# Patient Record
Sex: Male | Born: 1945 | Hispanic: No | Marital: Married | Smoking: Former smoker
Health system: Southern US, Community
[De-identification: ages and names within clinical notes are randomized; demographics above are authoritative.]

## PROBLEM LIST (undated history)

## (undated) DIAGNOSIS — R0789 Other chest pain: Secondary | ICD-10-CM

## (undated) DIAGNOSIS — K219 Gastro-esophageal reflux disease without esophagitis: Secondary | ICD-10-CM

## (undated) DIAGNOSIS — I10 Essential (primary) hypertension: Secondary | ICD-10-CM

## (undated) DIAGNOSIS — R296 Repeated falls: Secondary | ICD-10-CM

## (undated) DIAGNOSIS — Z972 Presence of dental prosthetic device (complete) (partial): Secondary | ICD-10-CM

## (undated) DIAGNOSIS — W19XXXA Unspecified fall, initial encounter: Secondary | ICD-10-CM

## (undated) DIAGNOSIS — E785 Hyperlipidemia, unspecified: Secondary | ICD-10-CM

## (undated) DIAGNOSIS — IMO0002 Reserved for concepts with insufficient information to code with codable children: Secondary | ICD-10-CM

## (undated) DIAGNOSIS — N400 Enlarged prostate without lower urinary tract symptoms: Secondary | ICD-10-CM

## (undated) DIAGNOSIS — M1990 Unspecified osteoarthritis, unspecified site: Secondary | ICD-10-CM

## (undated) DIAGNOSIS — J45909 Unspecified asthma, uncomplicated: Secondary | ICD-10-CM

## (undated) DIAGNOSIS — R195 Other fecal abnormalities: Secondary | ICD-10-CM

## (undated) DIAGNOSIS — G4733 Obstructive sleep apnea (adult) (pediatric): Secondary | ICD-10-CM

## (undated) DIAGNOSIS — K439 Ventral hernia without obstruction or gangrene: Secondary | ICD-10-CM

## (undated) DIAGNOSIS — G5600 Carpal tunnel syndrome, unspecified upper limb: Secondary | ICD-10-CM

## (undated) DIAGNOSIS — F419 Anxiety disorder, unspecified: Secondary | ICD-10-CM

## (undated) DIAGNOSIS — E119 Type 2 diabetes mellitus without complications: Secondary | ICD-10-CM

## (undated) DIAGNOSIS — G5601 Carpal tunnel syndrome, right upper limb: Secondary | ICD-10-CM

## (undated) DIAGNOSIS — R079 Chest pain, unspecified: Secondary | ICD-10-CM

## (undated) DIAGNOSIS — M549 Dorsalgia, unspecified: Secondary | ICD-10-CM

## (undated) DIAGNOSIS — Z9889 Other specified postprocedural states: Secondary | ICD-10-CM

## (undated) HISTORY — PX: SHOULDER SURGERY: SHX246

## (undated) HISTORY — DX: Unspecified asthma, uncomplicated: J45.909

## (undated) HISTORY — DX: Other fecal abnormalities: R19.5

## (undated) HISTORY — DX: Essential (primary) hypertension: I10

## (undated) HISTORY — DX: Reserved for concepts with insufficient information to code with codable children: IMO0002

## (undated) HISTORY — DX: Unspecified fall, initial encounter: W19.XXXA

## (undated) HISTORY — DX: Anxiety disorder, unspecified: F41.9

## (undated) HISTORY — DX: Carpal tunnel syndrome, right upper limb: G56.01

## (undated) HISTORY — DX: Carpal tunnel syndrome, unspecified upper limb: G56.00

## (undated) HISTORY — PX: CARPAL TUNNEL RELEASE: SHX101

## (undated) HISTORY — PX: NECK SURGERY: SHX720

## (undated) HISTORY — DX: Ventral hernia without obstruction or gangrene: K43.9

## (undated) HISTORY — DX: Benign prostatic hyperplasia without lower urinary tract symptoms: N40.0

## (undated) HISTORY — DX: Other specified postprocedural states: Z98.890

## (undated) HISTORY — PX: KNEE SURGERY: SHX244

## (undated) HISTORY — DX: Hyperlipidemia, unspecified: E78.5

## (undated) HISTORY — DX: Other chest pain: R07.89

## (undated) HISTORY — DX: Type 2 diabetes mellitus without complications: E11.9

## (undated) HISTORY — DX: Repeated falls: R29.6

## (undated) HISTORY — DX: Morbid (severe) obesity due to excess calories: E66.01

## (undated) HISTORY — DX: Obstructive sleep apnea (adult) (pediatric): G47.33

## (undated) HISTORY — DX: Unspecified osteoarthritis, unspecified site: M19.90

## (undated) HISTORY — DX: Chest pain, unspecified: R07.9

---

## 2002-07-18 ENCOUNTER — Encounter: Admission: RE | Admit: 2002-07-18 | Discharge: 2002-07-18 | Payer: Self-pay | Admitting: Unknown Physician Specialty

## 2002-07-18 ENCOUNTER — Encounter: Payer: Self-pay | Admitting: Unknown Physician Specialty

## 2003-11-12 ENCOUNTER — Other Ambulatory Visit: Payer: Self-pay

## 2005-05-01 ENCOUNTER — Emergency Department: Payer: Self-pay | Admitting: Emergency Medicine

## 2007-08-30 ENCOUNTER — Emergency Department: Payer: Self-pay | Admitting: Unknown Physician Specialty

## 2007-10-07 ENCOUNTER — Encounter: Admission: RE | Admit: 2007-10-07 | Discharge: 2007-10-07 | Payer: Self-pay | Admitting: Unknown Physician Specialty

## 2007-11-14 ENCOUNTER — Encounter: Admission: RE | Admit: 2007-11-14 | Discharge: 2007-11-14 | Payer: Self-pay | Admitting: Orthopedic Surgery

## 2008-03-20 ENCOUNTER — Ambulatory Visit: Payer: Self-pay | Admitting: Family Medicine

## 2008-07-11 ENCOUNTER — Ambulatory Visit: Payer: Self-pay | Admitting: Family Medicine

## 2008-09-09 ENCOUNTER — Ambulatory Visit: Payer: Self-pay | Admitting: Family Medicine

## 2009-02-20 ENCOUNTER — Encounter: Admission: RE | Admit: 2009-02-20 | Discharge: 2009-02-20 | Payer: Self-pay | Admitting: Orthopedic Surgery

## 2009-10-09 ENCOUNTER — Ambulatory Visit: Payer: Self-pay | Admitting: Urology

## 2009-10-22 ENCOUNTER — Ambulatory Visit: Payer: Self-pay | Admitting: Urology

## 2010-02-13 ENCOUNTER — Encounter: Admission: RE | Admit: 2010-02-13 | Discharge: 2010-02-13 | Payer: Self-pay | Admitting: Orthopedic Surgery

## 2010-09-21 ENCOUNTER — Ambulatory Visit: Payer: Self-pay | Admitting: Family Medicine

## 2010-11-03 ENCOUNTER — Emergency Department: Payer: Self-pay | Admitting: Unknown Physician Specialty

## 2013-02-26 DIAGNOSIS — M75100 Unspecified rotator cuff tear or rupture of unspecified shoulder, not specified as traumatic: Secondary | ICD-10-CM | POA: Insufficient documentation

## 2013-03-12 DIAGNOSIS — G8929 Other chronic pain: Secondary | ICD-10-CM | POA: Insufficient documentation

## 2013-07-04 ENCOUNTER — Encounter: Payer: Self-pay | Admitting: *Deleted

## 2013-07-06 ENCOUNTER — Ambulatory Visit: Payer: Self-pay | Admitting: Cardiovascular Disease

## 2013-07-06 ENCOUNTER — Encounter: Payer: Self-pay | Admitting: *Deleted

## 2013-07-17 ENCOUNTER — Encounter: Payer: Self-pay | Admitting: *Deleted

## 2013-07-19 ENCOUNTER — Ambulatory Visit: Payer: Self-pay | Admitting: Cardiovascular Disease

## 2013-07-26 ENCOUNTER — Encounter: Payer: Self-pay | Admitting: Cardiovascular Disease

## 2013-07-26 ENCOUNTER — Ambulatory Visit (INDEPENDENT_AMBULATORY_CARE_PROVIDER_SITE_OTHER): Payer: BC Managed Care – PPO | Admitting: Cardiovascular Disease

## 2013-07-26 VITALS — BP 90/60 | HR 71 | Ht 69.0 in | Wt 304.2 lb

## 2013-07-26 DIAGNOSIS — R0602 Shortness of breath: Secondary | ICD-10-CM

## 2013-07-26 DIAGNOSIS — R079 Chest pain, unspecified: Secondary | ICD-10-CM

## 2013-07-26 DIAGNOSIS — I10 Essential (primary) hypertension: Secondary | ICD-10-CM

## 2013-07-26 DIAGNOSIS — E785 Hyperlipidemia, unspecified: Secondary | ICD-10-CM

## 2013-07-26 HISTORY — DX: Chest pain, unspecified: R07.9

## 2013-07-26 NOTE — Progress Notes (Signed)
Primary care physician: Dr. Maryruth Hancock  HPI  This is a pleasant 67 year old man who was referred by Dr. Allena Katz for evaluation of atypical chest pain. The patient has no previous cardiac history. However, he has multiple chronic medical conditions that include diabetes mellitus, hypertension, hyperlipidemia, sleep apnea and morbid obesity. He suffers from severe arthritis. He had right shoulder surgery done 3 times in the past. He relies heavily on his left arm. Recently, he started having episodes of left-sided sharp chest discomfort worse with certain movements. This is usually brief and does not last for more than a minute or 2. He does have associated exertional dyspnea. He is not aware of any previous cardiac history or cardiac testing. There is no family history of premature coronary artery disease. He is a former smoker. His blood pressure is somewhat low today but he denies any dizziness.  No Known Allergies   Current Outpatient Prescriptions on File Prior to Visit  Medication Sig Dispense Refill  . albuterol (PROAIR HFA) 108 (90 BASE) MCG/ACT inhaler Inhale 2 puffs into the lungs every 6 (six) hours as needed for wheezing.      Marland Kitchen aspirin 81 MG tablet Take 81 mg by mouth daily.      Marland Kitchen atorvastatin (LIPITOR) 80 MG tablet Take 80 mg by mouth daily.      . cetirizine (ZYRTEC) 10 MG tablet Take 10 mg by mouth daily.      . chlorpheniramine (CHLOR-TRIMETON) 4 MG tablet Take 4 mg by mouth every 6 (six) hours as needed for allergies.      . DULoxetine (CYMBALTA) 30 MG capsule Take 30 mg by mouth 2 (two) times daily.      Marland Kitchen gabapentin (NEURONTIN) 600 MG tablet Take 600 mg by mouth 3 (three) times daily.      Marland Kitchen HYDROcodone-acetaminophen (NORCO) 10-325 MG per tablet Take 1 tablet by mouth every 4 (four) hours as needed for pain.      . hydrOXYzine (ATARAX/VISTARIL) 25 MG tablet Take 25 mg by mouth every 6 (six) hours as needed for itching.      . insulin detemir (LEVEMIR) 100 UNIT/ML injection  Inject 70 Units into the skin 2 (two) times daily.      . insulin lispro (HUMALOG) 100 UNIT/ML injection Inject 36 Units into the skin 2 (two) times daily.      Marland Kitchen lidocaine (LIDODERM) 5 % Place 1 patch onto the skin daily. Remove & Discard patch within 12 hours or as directed by MD      . lisinopril-hydrochlorothiazide (PRINZIDE,ZESTORETIC) 20-12.5 MG per tablet Take 1 tablet by mouth 2 (two) times daily.      . metFORMIN (GLUCOPHAGE) 500 MG tablet Take 500 mg by mouth 2 (two) times daily with a meal.      . methocarbamol (ROBAXIN) 500 MG tablet Take 1,000 mg by mouth every 12 (twelve) hours as needed.      . metoprolol (LOPRESSOR) 100 MG tablet Take 100 mg by mouth 2 (two) times daily.      . Multiple Vitamin (MULTIVITAMIN) tablet Take 1 tablet by mouth daily.      . naproxen (NAPROSYN) 500 MG tablet Take 500 mg by mouth 2 (two) times daily with a meal.      . Omega-3 Fatty Acids (FISH OIL) 1000 MG CAPS Take by mouth 2 (two) times daily.      Marland Kitchen omeprazole (PRILOSEC) 20 MG capsule Take 20 mg by mouth daily.      Marland Kitchen testosterone cypionate (  DEPOTESTOTERONE CYPIONATE) 200 MG/ML injection Inject 200 mg into the muscle every 14 (fourteen) days.      . traZODone (DESYREL) 150 MG tablet Take 150 mg by mouth at bedtime.      . triamcinolone cream (KENALOG) 0.5 % Apply topically 2 (two) times daily.      . vitamin C (ASCORBIC ACID) 500 MG tablet Take 500 mg by mouth daily.       No current facility-administered medications on file prior to visit.     Past Medical History  Diagnosis Date  . Morbid obesity   . Diabetes mellitus without complication   . DDD (degenerative disc disease)   . Carpal tunnel syndrome   . OA (osteoarthritis)     knees  . OSA (obstructive sleep apnea)   . Anxiety   . BPH (benign prostatic hypertrophy)   . Ventral hernia   . Heme positive stool   . Asthma   . Hyperlipidemia   . Hypertension      Past Surgical History  Procedure Laterality Date  . Knee surgery  Bilateral   . Neck surgery    . Carpal tunnel release    . Shoulder surgery Right      Family History  Problem Relation Age of Onset  . Heart attack Father      History   Social History  . Marital Status: Married    Spouse Name: N/A    Number of Children: N/A  . Years of Education: N/A   Occupational History  . Not on file.   Social History Main Topics  . Smoking status: Former Smoker -- 0.25 packs/day for 15 years    Types: Cigarettes  . Smokeless tobacco: Not on file  . Alcohol Use: No  . Drug Use: No  . Sexual Activity: Not on file   Other Topics Concern  . Not on file   Social History Narrative  . No narrative on file     ROS A 10 point review of system was performed. It is negative other than that mentioned in the history of present illness.   PHYSICAL EXAM   BP 90/60  Pulse 71  Ht 5\' 9"  (1.753 m)  Wt 304 lb 4 oz (138.007 kg)  BMI 44.91 kg/m2 Constitutional: He is oriented to person, place, and time. He is morbidly obese. No distress.  HENT: No nasal discharge.  Head: Normocephalic and atraumatic.  Eyes: Pupils are equal and round.  No discharge. Neck: Normal range of motion. Neck supple. No JVD present. No thyromegaly present.  Cardiovascular: Normal rate, regular rhythm, normal heart sounds. Exam reveals no gallop and no friction rub. No murmur heard.  Pulmonary/Chest: Effort normal and breath sounds normal. No stridor. No respiratory distress. He has no wheezes. He has no rales. He exhibits no tenderness.  Abdominal: Soft. Bowel sounds are normal. He exhibits no distension. There is no tenderness. There is no rebound and no guarding.  Musculoskeletal: Normal range of motion. He exhibits no edema and no tenderness.  Neurological: He is alert and oriented to person, place, and time. Coordination normal.  Skin: Skin is warm and dry. No rash noted. He is not diaphoretic. No erythema. No pallor.  Psychiatric: He has a normal mood and affect. His  behavior is normal. Judgment and thought content normal.       ION:GEXBM  Rhythm  WITHIN NORMAL LIMITS   ASSESSMENT AND PLAN

## 2013-07-26 NOTE — Assessment & Plan Note (Signed)
He is currently being treated with atorvastatin 80 mg daily.

## 2013-07-26 NOTE — Assessment & Plan Note (Signed)
Blood pressure is low today but he is asymptomatic. Continue to monitor.

## 2013-07-26 NOTE — Patient Instructions (Addendum)
Your physician has requested that you have a lexiscan myoview. For further information please visit [email or web address] Please follow instruction sheet, as given.  Your physician wants you to follow-up in: 12 months.  You will receive a reminder letter in the mail two months in advance. If you don't receive a letter, please call our office to schedule the follow-up appointment.  [facility] MYOVIEW  Your caregiver has ordered a Stress Test with nuclear imaging. The purpose of this test is to evaluate the blood supply to your heart muscle. This procedure is referred to as a "Non-Invasive Stress Test." This is because other than having an IV started in your vein, nothing is inserted or "invades" your body. Cardiac stress tests are done to find areas of poor blood flow to the heart by determining the extent of coronary artery disease (CAD). Some patients exercise on a treadmill, which naturally increases the blood flow to your heart, while others who are  unable to walk on a treadmill due to physical limitations have a pharmacologic/chemical stress agent called Lexiscan . This medicine will mimic walking on a treadmill by temporarily increasing your coronary blood flow.   Please note: these test may take anywhere between 2-4 hours to complete  PLEASE REPORT TO [facility]TR[facility]  THE VOLUNTEERS AT THE FIRST DESK WILL DIRECT YOU WHERE TO GO  Date of Procedure:___11/11/2014__________________________________  Arrival Time for Procedure:_____945 am_________________________  Instructions regarding medication:   ____ : Hold diabetes medication morning of procedure  ____:  Hold betablocker(s) night before procedure and morning of procedure  ____:  Hold other medications as  follows:_________________________________________________________________________________________________________________________________________________________________________________________________________________________________________________________________________________________  PLEASE NOTIFY THE OFFICE AT LEAST 24 HOURS IN ADVANCE IF YOU ARE UNABLE TO KEEP YOUR APPOINTMENT.  [phone] AND  PLEASE NOTIFY NUCLEAR MEDICINE AT [facility] AT LEAST 24 HOURS IN ADVANCE IF YOU ARE UNABLE TO KEEP YOUR APPOINTMENT. [phone]  How to prepare for your Myoview test:  1. Do not eat or drink after midnight 2. No caffeine for 24 hours prior to test 3. No smoking 24 hours prior to test. 4. Your medication may be taken with water.  If your doctor stopped a medication because of this test, do not take that medication. 5. Ladies, please do not wear dresses.  Skirts or pants are appropriate. Please wear a short sleeve shirt. 6. No perfume, cologne or lotion. 7. Wear comfortable walking shoes. No heels!

## 2013-07-26 NOTE — Assessment & Plan Note (Signed)
His chest pain is overall atypical and likely musculoskeletal based on his description. However, he has significant exertional dyspnea with multiple risk factors for coronary artery disease. Due to that, I recommend evaluation with a pharmacologic nuclear stress test. He is not able to exercise on a treadmill due to extensive arthritis. The stress test will be a 2 day protocol due to his weight.

## 2013-07-31 ENCOUNTER — Ambulatory Visit: Payer: Self-pay | Admitting: Cardiovascular Disease

## 2013-08-01 ENCOUNTER — Telehealth: Payer: Self-pay

## 2013-08-01 NOTE — Telephone Encounter (Signed)
The stress portion has to be done this week. Reschedule with nuclear medicine.

## 2013-08-01 NOTE — Telephone Encounter (Signed)
Spoke w/ pt's wife.  She states that pt's sinuses were "bothering him this morning", so he decided not to show up. Did not call anyone to let them know he was not coming. Pt's wife informed that pt may be charged for missing today's appt for test. She is agreeable to this and apologizes for not notifying specials or Dr. Kirke Corin that pt would not be coming in. Pt would like to call back to reschedule when he is feeling better.

## 2013-08-01 NOTE — Telephone Encounter (Signed)
Nuclear med from [facility] called and stated that pt did not show up for second day of stress test. Reports that resting pics were made yesterday.  Pt did not properly prep for resting portion, had coffee before procedure.  Alinda Money, PA spoke w/ pt about prep for today. Attempted to contact pt.  Left message for him to call back asap.

## 2013-08-02 NOTE — Telephone Encounter (Signed)
Stress portion of test rescheduled for Friday, Nov 14 @ 8:00. Pt to report to [facility] @ 7:45.  He states this is "too early, but I'll try to make it."

## 2013-08-03 ENCOUNTER — Other Ambulatory Visit: Payer: Self-pay

## 2013-08-03 DIAGNOSIS — R079 Chest pain, unspecified: Secondary | ICD-10-CM

## 2013-08-03 DIAGNOSIS — R0602 Shortness of breath: Secondary | ICD-10-CM

## 2013-08-06 ENCOUNTER — Telehealth: Payer: Self-pay

## 2013-08-06 NOTE — Telephone Encounter (Signed)
Message copied by Marilynne Halsted on Mon Aug 06, 2013  5:36 PM ------      Message from: Lorine Bears A      Created: Mon Aug 06, 2013  7:52 AM       Inform patient that  stress test was normal. Send a copy to PCP (Dr. Allena Katz) ------

## 2013-08-06 NOTE — Telephone Encounter (Signed)
Spoke w/ pt.  He is aware of results.  

## 2013-10-10 ENCOUNTER — Ambulatory Visit: Payer: Self-pay | Admitting: Pain Medicine

## 2013-10-11 ENCOUNTER — Other Ambulatory Visit: Payer: Self-pay | Admitting: Pain Medicine

## 2013-10-11 LAB — BASIC METABOLIC PANEL
Anion Gap: 5 — ABNORMAL LOW (ref 7–16)
BUN: 16 mg/dL (ref 7–18)
CHLORIDE: 98 mmol/L (ref 98–107)
CO2: 32 mmol/L (ref 21–32)
Calcium, Total: 8.5 mg/dL (ref 8.5–10.1)
Creatinine: 1.13 mg/dL (ref 0.60–1.30)
Glucose: 176 mg/dL — ABNORMAL HIGH (ref 65–99)
Osmolality: 276 (ref 275–301)
POTASSIUM: 4.6 mmol/L (ref 3.5–5.1)
SODIUM: 135 mmol/L — AB (ref 136–145)

## 2013-10-11 LAB — HEPATIC FUNCTION PANEL A (ARMC)
ALBUMIN: 3.8 g/dL (ref 3.4–5.0)
ALK PHOS: 67 U/L
BILIRUBIN DIRECT: 0.1 mg/dL (ref 0.00–0.20)
Bilirubin,Total: 0.5 mg/dL (ref 0.2–1.0)
SGOT(AST): 20 U/L (ref 15–37)
SGPT (ALT): 28 U/L (ref 12–78)
Total Protein: 6.6 g/dL (ref 6.4–8.2)

## 2013-10-11 LAB — SEDIMENTATION RATE: ERYTHROCYTE SED RATE: 7 mm/h (ref 0–20)

## 2013-10-11 LAB — MAGNESIUM: MAGNESIUM: 1.6 mg/dL — AB

## 2013-10-22 ENCOUNTER — Ambulatory Visit: Payer: Self-pay | Admitting: Pain Medicine

## 2013-11-07 ENCOUNTER — Ambulatory Visit: Payer: Self-pay | Admitting: Pain Medicine

## 2013-11-22 ENCOUNTER — Ambulatory Visit: Payer: Self-pay | Admitting: Pain Medicine

## 2013-12-05 ENCOUNTER — Ambulatory Visit: Payer: Self-pay | Admitting: Pain Medicine

## 2013-12-13 ENCOUNTER — Ambulatory Visit: Payer: Self-pay | Admitting: Pain Medicine

## 2013-12-27 ENCOUNTER — Ambulatory Visit: Payer: Self-pay | Admitting: Pain Medicine

## 2014-01-03 ENCOUNTER — Ambulatory Visit: Payer: Self-pay | Admitting: Pain Medicine

## 2014-01-08 ENCOUNTER — Ambulatory Visit: Payer: Self-pay | Admitting: Pain Medicine

## 2014-01-28 ENCOUNTER — Ambulatory Visit: Payer: Self-pay | Admitting: Pain Medicine

## 2014-02-05 ENCOUNTER — Ambulatory Visit: Payer: Self-pay | Admitting: Pain Medicine

## 2014-02-19 ENCOUNTER — Ambulatory Visit: Payer: Self-pay | Admitting: Pain Medicine

## 2014-03-27 ENCOUNTER — Ambulatory Visit: Payer: Self-pay | Admitting: Pain Medicine

## 2014-04-11 ENCOUNTER — Ambulatory Visit: Payer: Self-pay | Admitting: Pain Medicine

## 2014-04-11 ENCOUNTER — Observation Stay: Payer: Self-pay | Admitting: Pain Medicine

## 2014-05-22 ENCOUNTER — Ambulatory Visit: Payer: Self-pay | Admitting: Pain Medicine

## 2014-05-24 ENCOUNTER — Other Ambulatory Visit: Payer: Self-pay | Admitting: Pain Medicine

## 2014-05-24 LAB — MAGNESIUM: MAGNESIUM: 1.8 mg/dL

## 2014-05-30 ENCOUNTER — Ambulatory Visit: Payer: Self-pay | Admitting: Pain Medicine

## 2014-06-17 ENCOUNTER — Ambulatory Visit: Payer: Self-pay | Admitting: Pain Medicine

## 2014-06-20 ENCOUNTER — Ambulatory Visit: Payer: Self-pay | Admitting: Pain Medicine

## 2014-07-08 ENCOUNTER — Ambulatory Visit: Payer: Self-pay | Admitting: Pain Medicine

## 2014-07-25 ENCOUNTER — Ambulatory Visit: Payer: Self-pay | Admitting: Pain Medicine

## 2014-07-29 ENCOUNTER — Ambulatory Visit: Payer: BC Managed Care – PPO | Admitting: Cardiovascular Disease

## 2014-08-07 ENCOUNTER — Ambulatory Visit: Payer: Self-pay | Admitting: Pain Medicine

## 2014-08-12 ENCOUNTER — Encounter: Payer: Self-pay | Admitting: Cardiovascular Disease

## 2014-08-12 ENCOUNTER — Ambulatory Visit (INDEPENDENT_AMBULATORY_CARE_PROVIDER_SITE_OTHER): Payer: Medicare Other | Admitting: Cardiovascular Disease

## 2014-08-12 VITALS — BP 106/68 | HR 65 | Ht 69.0 in | Wt 299.5 lb

## 2014-08-12 DIAGNOSIS — I10 Essential (primary) hypertension: Secondary | ICD-10-CM

## 2014-08-12 DIAGNOSIS — R079 Chest pain, unspecified: Secondary | ICD-10-CM

## 2014-08-12 DIAGNOSIS — R0789 Other chest pain: Secondary | ICD-10-CM

## 2014-08-12 DIAGNOSIS — E785 Hyperlipidemia, unspecified: Secondary | ICD-10-CM

## 2014-08-12 NOTE — Assessment & Plan Note (Signed)
Atypical chest pain which is much less frequent than before. Stress test last year was normal. Continue treatment of risk factors. I had a discussion with him about the importance of lifestyle changes and weight loss.

## 2014-08-12 NOTE — Progress Notes (Signed)
Primary care physician: Dr. Maryruth HancockSallie Friedman  HPI  This is a pleasant 68 year old man who is here today for follow-up visit regarding  atypical chest pain. He has multiple chronic medical conditions that include diabetes mellitus, hypertension, hyperlipidemia, sleep apnea and morbid obesity. He suffers from severe arthritis. He had right shoulder surgery done 3 times in the past. He relies heavily on his left arm. He was seen last year for episodes of left-sided sharp chest discomfort worse with certain movements. The chest pain overall was atypical but he has significant exertional dyspnea. Thus, I proceeded with a pharmacologic nuclear stress test which showed no evidence of ischemia with normal ejection fraction. No significant change in symptoms since last year. The chest pain is much less frequent and continues to be at rest and not with physical activities.   No Known Allergies   Current Outpatient Prescriptions on File Prior to Visit  Medication Sig Dispense Refill  . albuterol (PROAIR HFA) 108 (90 BASE) MCG/ACT inhaler Inhale 2 puffs into the lungs every 6 (six) hours as needed for wheezing.    Marland Kitchen. aspirin 81 MG tablet Take 81 mg by mouth daily.    Marland Kitchen. atorvastatin (LIPITOR) 80 MG tablet Take 80 mg by mouth daily.    . cetirizine (ZYRTEC) 10 MG tablet Take 10 mg by mouth daily.    . chlorpheniramine (CHLOR-TRIMETON) 4 MG tablet Take 4 mg by mouth every 6 (six) hours as needed for allergies.    . DULoxetine (CYMBALTA) 30 MG capsule Take 30 mg by mouth 2 (two) times daily.    Marland Kitchen. gabapentin (NEURONTIN) 600 MG tablet Take 600 mg by mouth 3 (three) times daily.    . hydrOXYzine (ATARAX/VISTARIL) 25 MG tablet Take 25 mg by mouth every 6 (six) hours as needed for itching.    . insulin detemir (LEVEMIR) 100 UNIT/ML injection Inject 70 Units into the skin 2 (two) times daily.    Marland Kitchen. lidocaine (LIDODERM) 5 % Place 1 patch onto the skin daily. Remove & Discard patch within 12 hours or as directed by MD    .  metFORMIN (GLUCOPHAGE) 500 MG tablet Take 500 mg by mouth 2 (two) times daily with a meal.    . metoprolol (LOPRESSOR) 100 MG tablet Take 100 mg by mouth 2 (two) times daily.    . Multiple Vitamin (MULTIVITAMIN) tablet Take 1 tablet by mouth daily.    . naproxen (NAPROSYN) 500 MG tablet Take 500 mg by mouth 2 (two) times daily with a meal.    . Omega-3 Fatty Acids (FISH OIL) 1000 MG CAPS Take by mouth 2 (two) times daily.    Marland Kitchen. omeprazole (PRILOSEC) 20 MG capsule Take 20 mg by mouth daily.    Marland Kitchen. testosterone cypionate (DEPOTESTOTERONE CYPIONATE) 200 MG/ML injection Inject 200 mg into the muscle every 14 (fourteen) days.    . traZODone (DESYREL) 150 MG tablet Take 150 mg by mouth at bedtime.    . triamcinolone cream (KENALOG) 0.5 % Apply topically 2 (two) times daily.    . vitamin C (ASCORBIC ACID) 500 MG tablet Take 500 mg by mouth daily.     No current facility-administered medications on file prior to visit.     Past Medical History  Diagnosis Date  . Morbid obesity   . Diabetes mellitus without complication   . DDD (degenerative disc disease)   . Carpal tunnel syndrome   . OA (osteoarthritis)     knees  . OSA (obstructive sleep apnea)   . Anxiety   .  BPH (benign prostatic hypertrophy)   . Ventral hernia   . Heme positive stool   . Asthma   . Hyperlipidemia   . Hypertension      Past Surgical History  Procedure Laterality Date  . Knee surgery Bilateral   . Neck surgery    . Carpal tunnel release    . Shoulder surgery Right      Family History  Problem Relation Age of Onset  . Heart attack Father      History   Social History  . Marital Status: Married    Spouse Name: N/A    Number of Children: N/A  . Years of Education: N/A   Occupational History  . Not on file.   Social History Main Topics  . Smoking status: Former Smoker -- 0.25 packs/day for 15 years    Types: Cigarettes  . Smokeless tobacco: Not on file  . Alcohol Use: No  . Drug Use: No  . Sexual  Activity: Not on file   Other Topics Concern  . Not on file   Social History Narrative     ROS A 10 point review of system was performed. It is negative other than that mentioned in the history of present illness.   PHYSICAL EXAM   BP 106/68 mmHg  Pulse 65  Ht 5\' 9"  (1.753 m)  Wt 299 lb 8 oz (135.852 kg)  BMI 44.21 kg/m2 Constitutional: He is oriented to person, place, and time. He is morbidly obese. No distress.  HENT: No nasal discharge.  Head: Normocephalic and atraumatic.  Eyes: Pupils are equal and round.  No discharge. Neck: Normal range of motion. Neck supple. No JVD present. No thyromegaly present.  Cardiovascular: Normal rate, regular rhythm, normal heart sounds. Exam reveals no gallop and no friction rub. No murmur heard.  Pulmonary/Chest: Effort normal and breath sounds normal. No stridor. No respiratory distress. He has no wheezes. He has no rales. He exhibits no tenderness.  Abdominal: Soft. Bowel sounds are normal. He exhibits no distension. There is no tenderness. There is no rebound and no guarding.  Musculoskeletal: Normal range of motion. He exhibits no edema and no tenderness.  Neurological: He is alert and oriented to person, place, and time. Coordination normal.  Skin: Skin is warm and dry. No rash noted. He is not diaphoretic. No erythema. No pallor.  Psychiatric: He has a normal mood and affect. His behavior is normal. Judgment and thought content normal.       WUJ:[id number]:Sinus  Rhythm  WITHIN NORMAL LIMITS   ASSESSMENT AND PLAN

## 2014-08-12 NOTE — Assessment & Plan Note (Signed)
Blood pressure is controlled on current medications. 

## 2014-08-12 NOTE — Assessment & Plan Note (Signed)
This is being managed by his primary care physician. Recommend a target LDL of less than 100 and preferably less than 70.

## 2014-08-12 NOTE — Patient Instructions (Signed)
Continue same medications.   Your physician wants you to follow-up in: 1 year.  You will receive a reminder letter in the mail two months in advance. If you don't receive a letter, please call our office to schedule the follow-up appointment.  

## 2014-08-21 ENCOUNTER — Encounter: Payer: Self-pay | Admitting: Family Medicine

## 2014-09-20 ENCOUNTER — Encounter: Payer: Self-pay | Admitting: Family Medicine

## 2014-10-18 ENCOUNTER — Ambulatory Visit: Payer: Self-pay | Admitting: Pain Medicine

## 2014-11-15 ENCOUNTER — Ambulatory Visit: Payer: Self-pay | Admitting: Pain Medicine

## 2014-12-05 ENCOUNTER — Ambulatory Visit: Payer: Self-pay | Admitting: Internal Medicine

## 2015-01-11 NOTE — H&P (Signed)
PATIENT NAME:  James Friedman, James Friedman MR#:  [id number] DATE OF BIRTH:  19-Aug-1946  DATE OF ADMISSION:  04/11/2014  REFERRING PHYSICIAN: Maralyn SagoSarah "Tyrone SageSally" Patel, MD  ADMITTING PHYSICIAN: Delano MetzFrancisco Atira Borello, MD  NOTE: This is the case of a 69 year old morbidly obese white male patient who came into the clinic today for a bilateral lumbar facet radiofrequency neurotomy under fluoroscopic guidance. Because of the patient's body habitus, the procedure was technically difficult in finding the nerves to be stimulated and neurolysed. In order to keep the patient comfortable, I used quite a bit of the local anesthetic, which clearly made its way into the area of the nerve roots, of the left leg, causing numbness and weakness in the leg. When the patient attempted to stand up, he was unable to do that and he fell to his knees on the floor. Because the nurse was close to him, she was able to assist him preventing him to have a full fall pain. However, because of his weight, it took several of us to get him back into the bed. We have kept him in the recovery room for observation. His numbness seems to be wearing off, but because we are getting close to closure time for the clinic, we have taken steps to admit him to observation. As soon as his numbness completely goes away and he is able to bear weight, he will be discharged home.   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   ____________________________ Sydnee LevansFrancisco A. Laban EmperorNaveira, MD fan:sb D: 04/11/2014 15:59:56 ET T: 04/11/2014 16:21:55 ET JOB#: [id number]  cc: Ortencia Askari A. Laban EmperorNaveira, MD, <Dictator> Oswaldo DoneFRANCISCO A Dezire Turk MD ELECTRONICALLY SIGNED 04/12/2014 17:06

## 2015-07-09 ENCOUNTER — Ambulatory Visit: Payer: Medicare Other | Admitting: Pain Medicine

## 2015-07-17 ENCOUNTER — Other Ambulatory Visit: Payer: Self-pay | Admitting: Pain Medicine

## 2015-07-17 ENCOUNTER — Ambulatory Visit: Payer: Medicare (Managed Care) | Attending: Pain Medicine | Admitting: Pain Medicine

## 2015-07-17 ENCOUNTER — Encounter: Payer: Self-pay | Admitting: Pain Medicine

## 2015-07-17 VITALS — BP 128/66 | HR 81 | Temp 98.5°F | Resp 18 | Ht 69.0 in | Wt 292.0 lb

## 2015-07-17 DIAGNOSIS — M79602 Pain in left arm: Secondary | ICD-10-CM | POA: Diagnosis not present

## 2015-07-17 DIAGNOSIS — M542 Cervicalgia: Secondary | ICD-10-CM | POA: Diagnosis present

## 2015-07-17 DIAGNOSIS — M47816 Spondylosis without myelopathy or radiculopathy, lumbar region: Secondary | ICD-10-CM | POA: Insufficient documentation

## 2015-07-17 DIAGNOSIS — G8929 Other chronic pain: Secondary | ICD-10-CM | POA: Diagnosis not present

## 2015-07-17 DIAGNOSIS — M545 Low back pain, unspecified: Secondary | ICD-10-CM

## 2015-07-17 DIAGNOSIS — M4726 Other spondylosis with radiculopathy, lumbar region: Secondary | ICD-10-CM

## 2015-07-17 DIAGNOSIS — Z79891 Long term (current) use of opiate analgesic: Secondary | ICD-10-CM

## 2015-07-17 DIAGNOSIS — G894 Chronic pain syndrome: Secondary | ICD-10-CM

## 2015-07-17 DIAGNOSIS — M79601 Pain in right arm: Secondary | ICD-10-CM | POA: Diagnosis not present

## 2015-07-17 DIAGNOSIS — E785 Hyperlipidemia, unspecified: Secondary | ICD-10-CM | POA: Insufficient documentation

## 2015-07-17 DIAGNOSIS — M533 Sacrococcygeal disorders, not elsewhere classified: Secondary | ICD-10-CM | POA: Diagnosis not present

## 2015-07-17 DIAGNOSIS — M47812 Spondylosis without myelopathy or radiculopathy, cervical region: Secondary | ICD-10-CM | POA: Insufficient documentation

## 2015-07-17 DIAGNOSIS — Z9889 Other specified postprocedural states: Secondary | ICD-10-CM | POA: Diagnosis not present

## 2015-07-17 DIAGNOSIS — M47896 Other spondylosis, lumbar region: Secondary | ICD-10-CM | POA: Diagnosis not present

## 2015-07-17 DIAGNOSIS — G5601 Carpal tunnel syndrome, right upper limb: Secondary | ICD-10-CM

## 2015-07-17 DIAGNOSIS — F1120 Opioid dependence, uncomplicated: Secondary | ICD-10-CM

## 2015-07-17 DIAGNOSIS — M797 Fibromyalgia: Secondary | ICD-10-CM

## 2015-07-17 DIAGNOSIS — I10 Essential (primary) hypertension: Secondary | ICD-10-CM | POA: Diagnosis not present

## 2015-07-17 DIAGNOSIS — M549 Dorsalgia, unspecified: Secondary | ICD-10-CM | POA: Diagnosis present

## 2015-07-17 DIAGNOSIS — M5412 Radiculopathy, cervical region: Secondary | ICD-10-CM

## 2015-07-17 DIAGNOSIS — M7918 Myalgia, other site: Secondary | ICD-10-CM

## 2015-07-17 DIAGNOSIS — M79603 Pain in arm, unspecified: Secondary | ICD-10-CM

## 2015-07-17 DIAGNOSIS — F1190 Opioid use, unspecified, uncomplicated: Secondary | ICD-10-CM

## 2015-07-17 DIAGNOSIS — Z79899 Other long term (current) drug therapy: Secondary | ICD-10-CM

## 2015-07-17 DIAGNOSIS — M5417 Radiculopathy, lumbosacral region: Secondary | ICD-10-CM | POA: Insufficient documentation

## 2015-07-17 DIAGNOSIS — M961 Postlaminectomy syndrome, not elsewhere classified: Secondary | ICD-10-CM

## 2015-07-17 HISTORY — DX: Carpal tunnel syndrome, right upper limb: G56.01

## 2015-07-17 HISTORY — DX: Other specified postprocedural states: Z98.890

## 2015-07-17 NOTE — Progress Notes (Signed)
Patient's Name: James Friedman MRN: [id number] DOB: 1946/04/19 DOS: 07/17/2015  Primary Reason(s) for Visit: Evaluation of one or more chronic illness with mild exacerbation or progression. CC: Back Pain; Neck Pain; and Knee Pain   HPI:   James Friedman is a 69 y.o. year old, male patient, who returns today as an established patient. He has Hyperlipidemia; Hypertension; Chest pain; Chronic pain; Pain in shoulder (Bilateral) (L>R); Non-traumatic rotator cuff tear; Chronic pain syndrome; Chronic low back pain; Lumbar spondylosis; Lumbosacral radiculopathy at S1 (Right side); Lumbar facet syndrome (Bilateral); Chronic neck pain; Chronic radicular cervical pain (Bilateral) (L>R); Cervical spondylosis; Morbid obesity (HCC); Long term current use of opiate analgesic; Long term prescription opiate use; Opiate use; Opiate dependence (HCC); Chronic sacroiliac joint pain (R>L); Failed neck surgery syndrome; Chronic upper extremity pain (Bilateral) (L>R); History of bilateral knee surgery; Diffuse myofascial pain syndrome; Chronic right sacroiliac joint pain; Lumbar facet hypertrophy (L4-5 and L5-S1); and Carpal tunnel syndrome on right (Released) on his problem list.. His primarily concern today is the Back Pain; Neck Pain; and Knee Pain   The patient returns to the clinic today indicating that he is having a flareup of his low back pain. In reviewing his records, we do have a long-standing history of lumbar facet pain as well as radicular symptoms. Today he indicates that his primary pain is in the lower back across the entire back but he is having pain going down both lower extremities with the worst being in the right side. On the right side the pain goes all the way down to the bottom of his foot in what seems to be an S1 dermatomal distribution. On the left side is getting worse and going down the leg but so far he has gone to the knee. The patient indicates that being a long time since he had an MRI of the lower  back and I agree that we need to repeat this especially due to the fact that he is having this radicular pain. Because his primary pain is in the lower back today's examination centered around that he does have tenderness to palpation over the area but he also has pain on hyperextension and rotation, bilaterally. Reviewing his notes, I see that on 04/11/2014 the patient underwent bilateral lumbar facet radiofrequency ablation with excellent results. This was more than a year ago and clearly it wearing off. We will bring the patient back for a diagnostic bilateral lumbar facet block. If the patient proves to have good results with this, we will then repeat the radiofrequency. Meanwhile, we will also order a repeat MRI of the lumbar spine.  Pharmacotherapy Review: Side-effects or Adverse reactions: None reported. Effectiveness: Described as relatively effective, allowing for increase in activities of daily living (ADL). Onset of action: Within expected pharmacological parameters. Duration of action: Within normal limits for medication. Peak effect: Timing and results are as within normal expected parameters. [place] PMP: Compliant with practice rules and regulations. DST: Compliant with practice rules and regulations. Lab work: No new labs ordered by our practice. Treatment compliance: Compliant. Substance Use Disorder (SUD) Risk Level: Low Planned course of action: Continue therapy as is.  Allergies: [name] has No Known Allergies.  Meds: The patient has a current medication list which includes the following prescription(s): albuterol, amlodipine, aspirin, atorvastatin, azelastine, capsaicin, cetirizine, chlorpheniramine, vitamin d3, clotrimazole, duloxetine, gabapentin, hydroxyzine, insulin aspart, insulin detemir, lidocaine, magnesium, metformin, metoprolol, morphine, multivitamin, naproxen, fish oil, omeprazole, oxycodone hcl, testosterone cypionate, trazodone, triamcinolone cream, vitamin c, and  metformin. Requested Prescriptions    No prescriptions requested or ordered in this encounter    ROS: Constitutional: Afebrile, no chills, well hydrated and well nourished Gastrointestinal: negative Musculoskeletal:negative Neurological: negative Behavioral/Psych: negative  PFSH: Medical:  [name]  has a past medical history of Morbid obesity (HCC); Diabetes mellitus without complication (HCC); DDD (degenerative disc disease); Carpal tunnel syndrome; OA (osteoarthritis); OSA (obstructive sleep apnea); Anxiety; BPH (benign prostatic hypertrophy); Ventral hernia; Heme positive stool; Asthma; Hyperlipidemia; Hypertension; History of bilateral knee surgery (07/17/2015); and Carpal tunnel syndrome on right (07/17/2015). Family: family history includes Heart attack in his father. Surgical:  has past surgical history that includes Knee surgery (Bilateral); Neck surgery; Carpal tunnel release; and Shoulder surgery (Right). Tobacco:  reports that he has quit smoking. His smoking use included Cigarettes. He has a 3.75 pack-year smoking history. He does not have any smokeless tobacco history on file. Alcohol:  reports that he does not drink alcohol. Drug:  reports that he does not use illicit drugs.  Physical Exam: Vitals:  Today's Vitals   07/17/15 0855 07/17/15 0858  BP:  128/66  Pulse: 81   Temp: 98.5 F (36.9 C)   Resp: 18   Height:  (1.753 m)   Weight: 292 lb (132.45 kg)   SpO2: 97%   PainSc: 10-Worst pain ever 10-Worst pain ever  Calculated BMI: Body mass index is 43.1 kg/(m^2). General appearance: alert, cooperative, appears stated age, moderate distress and morbidly obese Eyes: conjunctivae/corneas clear. PERRL, EOM's intact. Fundi benign. Lungs: No evidence respiratory distress, no audible rales or ronchi and no use of accessory muscles of respiration Neck: no adenopathy, no carotid bruit, no JVD, supple, symmetrical, trachea midline and thyroid not enlarged, symmetric,  no tenderness/mass/nodules Back: symmetric, no curvature. ROM normal. No CVA tenderness. Extremities: The patient does present with bilateral lower extremity pain with some degree of edema although it is difficult to judge because of his morbid obesity. He does have some generalized weakness, which could be secondary to the pain. He also indicates having some difficulty standing up from the sitting position. Pulses: 2+ and symmetric Skin: Skin color, texture, turgor normal. No rashes or lesions Neurologic: Gait: Antalgic He is using a walker to ambulate.    Assessment: Encounter Diagnosis:  Primary Diagnosis: Facet syndrome, lumbar [M54.5]  Plan: Ikechukwu was seen today for back pain, neck pain and knee pain.  Diagnoses and all orders for this visit:  Facet syndrome, lumbar -     LUMBAR FACET(MEDIAL BRANCH NERVE BLOCK) MBNB; Future  Chronic pain  Chronic pain syndrome  Chronic low back pain  Osteoarthritis of spine with radiculopathy, lumbar region  Lumbosacral radiculopathy at S1 (Right side) -     MR Lumbar Spine Wo Contrast; Future  Chronic neck pain  Chronic radicular cervical pain  Cervical spondylosis  Morbid obesity due to excess calories (HCC)  Long term current use of opiate analgesic  Long term prescription opiate use  Opiate use  Uncomplicated opioid dependence (HCC)  Chronic sacroiliac joint pain  Failed neck surgery syndrome  Chronic pain of both upper extremities  History of bilateral knee surgery  Diffuse myofascial pain syndrome  Chronic right sacroiliac joint pain  Lumbar facet hypertrophy (L4-5 and L5-S1)  Carpal tunnel syndrome on right (Released)     There are no Patient Instructions on file for this visit. Medications discontinued today:  There are no discontinued medications. Medications administered today:  Mr. Lunn does not currently have medications on file.  Primary Care Physician:  Maryruth HancockSALLIE PATEL, MD [facility]  Outpatient Pain Management Facility Note by: Sydnee LevansFrancisco A. Laban EmperorNaveira, M.D, DABA, DABAPM, DABPM, DABIPP, FIPP

## 2015-07-17 NOTE — Progress Notes (Signed)
Safety precautions to be maintained throughout the outpatient stay will include: orient to surroundings, keep bed in low position, maintain call bell within reach at all times, provide assistance with transfer out of bed and ambulation. Did not bring pills.  Pills are in a bubble pack from pharmacy.

## 2015-07-25 ENCOUNTER — Ambulatory Visit (HOSPITAL_COMMUNITY)
Admission: RE | Admit: 2015-07-25 | Discharge: 2015-07-25 | Disposition: A | Discharge status: Home / Self Care | Payer: PRIVATE HEALTH INSURANCE | Source: Ambulatory Visit | Attending: Pain Medicine | Admitting: Pain Medicine

## 2015-07-25 DIAGNOSIS — M4316 Spondylolisthesis, lumbar region: Secondary | ICD-10-CM | POA: Insufficient documentation

## 2015-07-25 DIAGNOSIS — M545 Low back pain: Secondary | ICD-10-CM | POA: Diagnosis present

## 2015-07-25 DIAGNOSIS — M4806 Spinal stenosis, lumbar region: Secondary | ICD-10-CM | POA: Diagnosis not present

## 2015-07-25 DIAGNOSIS — M5417 Radiculopathy, lumbosacral region: Secondary | ICD-10-CM

## 2015-07-25 DIAGNOSIS — M5137 Other intervertebral disc degeneration, lumbosacral region: Secondary | ICD-10-CM | POA: Diagnosis not present

## 2015-07-25 DIAGNOSIS — M5126 Other intervertebral disc displacement, lumbar region: Secondary | ICD-10-CM | POA: Insufficient documentation

## 2015-07-25 LAB — TOXASSURE SELECT 13 (MW), URINE: PDF: 0

## 2015-07-31 ENCOUNTER — Encounter: Payer: Self-pay | Admitting: *Deleted

## 2015-07-31 ENCOUNTER — Ambulatory Visit: Payer: Medicare (Managed Care) | Attending: Pain Medicine | Admitting: Pain Medicine

## 2015-07-31 ENCOUNTER — Encounter: Payer: Self-pay | Admitting: Pain Medicine

## 2015-07-31 VITALS — BP 119/80 | HR 81 | Temp 98.4°F | Resp 16 | Ht 69.0 in | Wt 292.0 lb

## 2015-07-31 DIAGNOSIS — M47817 Spondylosis without myelopathy or radiculopathy, lumbosacral region: Secondary | ICD-10-CM | POA: Diagnosis not present

## 2015-07-31 DIAGNOSIS — I10 Essential (primary) hypertension: Secondary | ICD-10-CM | POA: Insufficient documentation

## 2015-07-31 DIAGNOSIS — M545 Low back pain, unspecified: Secondary | ICD-10-CM

## 2015-07-31 DIAGNOSIS — G5601 Carpal tunnel syndrome, right upper limb: Secondary | ICD-10-CM | POA: Diagnosis not present

## 2015-07-31 DIAGNOSIS — M549 Dorsalgia, unspecified: Secondary | ICD-10-CM | POA: Diagnosis present

## 2015-07-31 DIAGNOSIS — M5417 Radiculopathy, lumbosacral region: Secondary | ICD-10-CM | POA: Insufficient documentation

## 2015-07-31 DIAGNOSIS — M4726 Other spondylosis with radiculopathy, lumbar region: Secondary | ICD-10-CM | POA: Diagnosis not present

## 2015-07-31 DIAGNOSIS — M542 Cervicalgia: Secondary | ICD-10-CM | POA: Diagnosis present

## 2015-07-31 DIAGNOSIS — R531 Weakness: Secondary | ICD-10-CM | POA: Diagnosis not present

## 2015-07-31 DIAGNOSIS — M25512 Pain in left shoulder: Secondary | ICD-10-CM | POA: Insufficient documentation

## 2015-07-31 DIAGNOSIS — E785 Hyperlipidemia, unspecified: Secondary | ICD-10-CM | POA: Diagnosis not present

## 2015-07-31 DIAGNOSIS — G894 Chronic pain syndrome: Secondary | ICD-10-CM | POA: Diagnosis not present

## 2015-07-31 DIAGNOSIS — G8929 Other chronic pain: Secondary | ICD-10-CM

## 2015-07-31 DIAGNOSIS — R29898 Other symptoms and signs involving the musculoskeletal system: Secondary | ICD-10-CM | POA: Insufficient documentation

## 2015-07-31 DIAGNOSIS — Z9181 History of falling: Secondary | ICD-10-CM | POA: Insufficient documentation

## 2015-07-31 DIAGNOSIS — M47816 Spondylosis without myelopathy or radiculopathy, lumbar region: Secondary | ICD-10-CM

## 2015-07-31 MED ORDER — LACTATED RINGERS IV SOLN: 1000.0000 mL | INTRAVENOUS | Status: AC

## 2015-07-31 MED ORDER — FENTANYL CITRATE (PF) 100 MCG/2ML IJ SOLN: 100.0000 ug | Freq: Once | INTRAMUSCULAR | Status: DC

## 2015-07-31 MED ORDER — TRIAMCINOLONE ACETONIDE 40 MG/ML IJ SUSP: 40.0000 mg | Freq: Once | INTRAMUSCULAR | Status: DC

## 2015-07-31 MED ORDER — LIDOCAINE HCL (PF) 1 % IJ SOLN: 10.0000 mL | Freq: Once | INTRAMUSCULAR | Status: DC

## 2015-07-31 MED ORDER — ROPIVACAINE HCL 2 MG/ML IJ SOLN: 9.0000 mL | Freq: Once | INTRAMUSCULAR | Status: DC

## 2015-07-31 MED ORDER — MIDAZOLAM HCL 5 MG/5ML IJ SOLN: 5.0000 mg | Freq: Once | INTRAMUSCULAR | Status: DC

## 2015-07-31 NOTE — Discharge Instructions (Signed)

## 2015-07-31 NOTE — Progress Notes (Signed)
Patient's Name: James Friedman MRN: [id number] DOB: 03/10/1946 DOS: 07/31/2015  Primary Reason(s) for Visit: Interventional Pain Management Treatment. CC: Back Pain and Neck Pain   Pre-Procedure Assessment: James Friedman is a 69 y.o. year old, male patient, seen today for interventional treatment. He has Hyperlipidemia; Hypertension; Chest pain; Chronic pain; Pain in shoulder (Bilateral) (L>R); Non-traumatic rotator cuff tear; Chronic pain syndrome; Chronic low back pain (Bilateral); Lumbar spondylosis; Lumbosacral radiculopathy at S1 (Right side); Lumbar facet syndrome (Bilateral); Chronic neck pain; Chronic radicular cervical pain (Bilateral) (L>R); Cervical spondylosis; Morbid obesity ([place]); Long term current use of opiate analgesic; Long term prescription opiate use; Opiate use; Opiate dependence ([place]); Chronic sacroiliac joint pain (R>L); Failed neck surgery syndrome; Chronic upper extremity pain (Bilateral) (L>R); History of bilateral knee surgery; Diffuse myofascial pain syndrome; Chronic right sacroiliac joint pain; Lumbar facet hypertrophy (L4-5 and L5-S1); Carpal tunnel syndrome on right (Released); Transient weakness of left lower extremity; and At high risk for falls on his problem list.. His primarily concern today is the Back Pain and Neck Pain Verification of the correct person, correct site (including marking of site), and correct procedure were performed and confirmed by the patient. Today's Vitals   07/31/15 1005 07/31/15 1014 07/31/15 1024 07/31/15 1034  BP: 174/104 129/61 117/57 119/80  Pulse: 88 80 79 81  Temp:      Resp: '16 18 18 16  ' Height:      Weight:      SpO2: 91% 95% 96% 97%  PainSc: 0-No pain '2  1  1   ' PainLoc:      Calculated BMI: Body mass index is 43.1 kg/(m^2). Allergies: He has No Known Allergies.. Primary Diagnosis: Osteoarthritis of spine with radiculopathy, lumbar region [M47.26]  Procedure: Type: Diagnostic Medial Branch Facet Block Region:  Lumbar Level: L2, L3, L4, L5, & S1 Medial Branch Level(s) Laterality: Bilateral  Indications: Spondylosis, Lumbosacral Region Lumbosacral Spine Pain  Consent: Secured. Under the influence of no sedatives a written informed consent was obtained, after having provided information on the risks and possible complications. To fulfill our ethical and legal obligations, as recommended by the American Medical Association's Code of Ethics, we have provided information to the patient about our clinical impression; the nature and purpose of the treatment or procedure; the risks, benefits, and possible complications of the intervention; alternatives; the risk(s) and benefit(s) of the alternative treatment(s) or procedure(s); and the risk(s) and benefit(s) of doing nothing. The patient was provided information about the risks and possible complications associated with the procedure. In the case of spinal procedures these may include, but are not limited to, failure to achieve desired goals, infection, bleeding, organ or nerve damage, allergic reactions, paralysis, and death. In addition, the patient was informed that Medicine is not an exact science; therefore, there is also the possibility of unforeseen risks and possible complications that may result in a catastrophic outcome. The patient indicated having understood very clearly. We have given the patient no guarantees and we have made no promises. Enough time was given to the patient to ask questions, all of which were answered to the patient's satisfaction.  Pre-Procedure Preparation: Safety Precautions: Allergies reviewed. Appropriate site, procedure, and patient were confirmed by following the Joint Commission's Universal Protocol (UP.01.01.01), in the form of a "Time Out". The patient was asked to confirm marked site and procedure, before commencing. The patient was asked about blood thinners, or active infections, both of which were denied. Patient was  assessed for positional comfort and all pressure  points were checked before starting procedure. Monitoring:  As per clinic protocol. Infection Control Precautions: Sterile technique used. Standard Universal Precautions were taken as recommended by the Department of [facility] for Disease Control and Prevention (CDC). Standard pre-surgical skin prep was conducted. Respiratory hygiene and cough etiquette was practiced. Hand hygiene observed. Safe injection practices and needle disposal techniques followed. SDV (single dose vial) medications used. Medications properly checked for expiration dates and contaminants. Personal protective equipment (PPE) used: Sterile double glove technique. Radiation resistant gloves. Sterile surgical gloves.  Anesthesia, Analgesia, Anxiolysis: Type: Moderate (Conscious) Sedation & Local Anesthesia. Meaningful verbal contact was maintained, with the patient at all times during the procedure. Local Anesthetic(s): Lidocaine 1% IV Access: Secured Sedation: Meaningful verbal contact was maintained at all times during the procedure. Indication(s):Analgesia.  Description of Procedure Process:  Time-out: "Time-out" completed before starting procedure, as per protocol. Position: Prone Target Area: For Lumbar Facet blocks, the target is the groove formed by the junction of the transverse process and superior articular process. For the L5 dorsal ramus, the target is the notch between superior articular process and sacral ala. For the S1 dorsal ramus, the target is the superior and lateral edge of the posterior S1 Sacral foramen. Approach: Paramedial approach. Area Prepped: Entire Posterior Lumbosacral Region Prepping solution: ChloraPrep (2% chlorhexidine gluconate and 70% isopropyl alcohol) Safety Precautions: Aspiration looking for blood return was conducted prior to all injections. At no point did we inject any substances, as a needle was being advanced. No attempts  were made at seeking any paresthesias. Safe injection practices and needle disposal techniques used. Medications properly checked for expiration dates. SDV (single dose vial) medications used. Description of the Procedure: Protocol guidelines were followed. The patient was placed in position over the fluoroscopy table. The target area was identified and the area prepped in the usual manner. Skin desensitized using vapocoolant spray. Skin & deeper tissues infiltrated with local anesthetic. Appropriate amount of time allowed to pass for local anesthetics to take effect. The procedure needle was introduced through the skin, ipsilateral to the reported pain, and advanced to the target area. Employing the "Medial Branch Technique", the needles were advanced to the angle made by the superior and medial portion of the transverse process, and the lateral and inferior portion of the superior articulating process of the targeted vertebral bodies. This area is known as "Burton's Eye" or the "Eye of the [place] Dog". A procedure needle was introduced through the skin, and this time advanced to the angle made by the superior and medial border of the sacral ala, and the lateral border of the S1 vertebral body. This last needle was later repositioned at the superior and lateral border of the posterior S1 foramen. Negative aspiration confirmed. Solution injected in intermittent fashion, asking for systemic symptoms every 0.5cc of injectate. The needles were then removed and the area cleansed, making sure to leave some of the prepping solution back to take advantage of its long term bactericidal properties. EBL: None Materials & Medications Used:  Needle(s) Used: 22g - 5" Spinal Needle(s) Medications Administered today: Mr. Mehring had no medications administered during this visit.Please see chart orders for dosing details.  Imaging Guidance:  Type of Imaging Technique: Fluoroscopy Guidance (Spinal) Indication(s):  Assistance in needle guidance and placement for procedures requiring needle placement in or near specific anatomical locations not easily accessible without such assistance. Exposure Time: Please see nurses notes. Contrast: None required. Fluoroscopic Guidance: I was personally present in the fluoroscopy suite, where  the patient was placed in position for the procedure, over the fluoroscopy-compatible table. Fluoroscopy was manipulated, using "Tunnel Vision Technique", to obtain the best possible view of the target area, on the affected side. Parallax error was corrected before commencing the procedure. A "direction-depth-direction" technique was used to introduce the needle under continuous pulsed fluoroscopic guidance. Once the target was reached, antero-posterior, oblique, and lateral fluoroscopic projection views were taken to confirm needle placement in all planes. Permanently recorded images stored by scanning into EMR. Interpretation: Intraoperative imaging interpretation by performing Physician. Adequate needle placement confirmed. Adequate needle placement confirmed in AP, lateral, & Oblique Views. No contrast injected.  Antibiotics:  Type:  Antibiotics Given (last 72 hours)    None      Indication(s): No indications identified.  Post-operative Assessment:  Complications: No immediate post-treatment complications were observed. Disposition: Return to clinic for follow-up evaluation. The patient tolerated the entire procedure well. A repeat set of vitals were taken after the procedure and the patient was kept under observation following institutional policy, for this procedure. Post-procedural neurological assessment was performed, showing return to baseline, prior to discharge. The patient was discharged home, once institutional criteria were met. The patient was provided with post-procedure discharge instructions, including a section on how to identify potential problems. Should any  problems arise concerning this procedure, the patient was given instructions to immediately contact us, at any time, without hesitation. In any case, we plan to contact the patient by telephone for a follow-up status report regarding this interventional procedure. Comments:  No additional relevant information.  Primary Care Physician: Baltazar Apo, MD Location: [facility] Outpatient Pain Management Facility Note by: Kathlen Brunswick. Dossie Arbour, M.D, DABA, DABAPM, DABPM, DABIPP, FIPP  Disclaimer:  Medicine is not an exact science. The only guarantee in medicine is that nothing is guaranteed. It is important to note that the decision to proceed with this intervention was based on the information collected from the patient. The Data and conclusions were drawn from the patient's questionnaire, the interview, and the physical examination. Because the information was provided in large part by the patient, it cannot be guaranteed that it has not been purposely or unconsciously manipulated. Every effort has been made to obtain as much relevant data as possible for this evaluation. It is important to note that the conclusions that lead to this procedure are derived in large part from the available data. Always take into account that the treatment will also be dependent on availability of resources and existing treatment guidelines, considered by other Pain Management Practitioners as being common knowledge and practice, at the time of the intervention. For Medico-Legal purposes, it is also important to point out that variation in procedural techniques and pharmacological choices are the acceptable norm. The indications, contraindications, technique, and results of the above procedure should only be interpreted and judged by a Board-Certified Interventional Pain Specialist with extensive familiarity and expertise in the same exact procedure and technique. Attempts at providing opinions without similar or greater experience and  expertise than that of the treating physician will be considered as inappropriate and unethical, and shall result in a formal complaint to the state medical board and applicable specialty societies.

## 2015-07-31 NOTE — Patient Instructions (Signed)
Facet Joint Block The facet joints connect the bones of the spine (vertebrae). They make it possible for you to bend, twist, and make other movements with your spine. They also prevent you from overbending, overtwisting, and making other excessive movements.  A facet joint block is a procedure where a numbing medicine (anesthetic) is injected into a facet joint. Often, a type of anti-inflammatory medicine called a steroid is also injected. A facet joint block may be done for two reasons:   Diagnosis. A facet joint block may be done as a test to see whether neck or back pain is caused by a worn-down or infected facet joint. If the pain gets better after a facet joint block, it means the pain is probably coming from the facet joint. If the pain does not get better, it means the pain is probably not coming from the facet joint.   Therapy. A facet joint block may be done to relieve neck or back pain caused by a facet joint. A facet joint block is only done as a therapy if the pain does not improve with medicine, exercise programs, physical therapy, and other forms of pain management. LET YOUR HEALTH CARE PROVIDER KNOW ABOUT:   Any allergies you have.   All medicines you are taking, including vitamins, herbs, eyedrops, and over-the-counter medicines and creams.   Previous problems you or members of your family have had with the use of anesthetics.   Any blood disorders you have had.   Other health problems you have. RISKS AND COMPLICATIONS Generally, having a facet joint block is safe. However, as with any procedure, complications can occur. Possible complications associated with having a facet joint block include:   Bleeding.   Injury to a nerve near the injection site.   Pain at the injection site.   Weakness or numbness in areas controlled by nerves near the injection site.   Infection.   Temporary fluid retention.   Allergic reaction to anesthetics or medicines used during  the procedure. BEFORE THE PROCEDURE   Follow your health care provider's instructions if you are taking dietary supplements or medicines. You may need to stop taking them or reduce your dosage.   Do not take any new dietary supplements or medicines without asking your health care provider first.   Follow your health care provider's instructions about eating and drinking before the procedure. You may need to stop eating and drinking several hours before the procedure.   Arrange to have an adult drive you home after the procedure. PROCEDURE  You may need to remove your clothing and dress in an open-back gown so that your health care provider can access your spine.   The procedure will be done while you are lying on an X-ray table. Most of the time you will be asked to lie on your stomach, but you may be asked to lie in a different position if an injection will be made in your neck.   Special machines will be used to monitor your oxygen levels, heart rate, and blood pressure.   If an injection will be made in your neck, an intravenous (IV) tube will be inserted into one of your veins. Fluids and medicine will flow directly into your body through the IV tube.   The area over the facet joint where the injection will be made will be cleaned with an antiseptic soap. The surrounding skin will be covered with sterile drapes.   An anesthetic will be applied to your skin   to make the injection area numb. You may feel a temporary stinging or burning sensation.   A video X-ray machine will be used to locate the joint. A contrast dye may be injected into the facet joint area to help with locating the joint.   When the joint is located, an anesthetic medicine will be injected into the joint through the needle.   Your health care provider will ask you whether you feel pain relief. If you do feel relief, a steroid may be injected to provide pain relief for a longer period of time. If you do not  feel relief or feel only partial relief, additional injections of an anesthetic may be made in other facet joints.   The needle will be removed, the skin will be cleansed, and bandages will be applied.  AFTER THE PROCEDURE   You will be observed for 15-30 minutes before being allowed to go home. Do not drive. Have an adult drive you or take a taxi or public transportation instead.   If you feel pain relief, the pain will return in several hours or days when the anesthetic wears off.   You may feel pain relief 2-14 days after the procedure. The amount of time this relief lasts varies from person to person.   It is normal to feel some tenderness over the injected area(s) for 2 days following the procedure.   If you have diabetes, you may have a temporary increase in blood sugar.   This information is not intended to replace advice given to you by your health care provider. Make sure you discuss any questions you have with your health care provider.   Document Released: 01/26/2007 Document Revised: 09/27/2014 Document Reviewed: 06/26/2012 [employer] Interactive Patient Education 2016 [employer] Pain Management Discharge Instructions  General Discharge Instructions :  If you need to reach your doctor call: Monday-Friday 8:00 am - 4:00 pm at [phone] or toll free [phone].  After clinic hours [phone] to have operator reach doctor.  Bring all of your medication bottles to all your appointments in the pain clinic.  To cancel or reschedule your appointment with Pain Management please remember to call 24 hours in advance to avoid a fee.  Refer to the educational materials which you have been given on: General Risks, I had my Procedure. Discharge Instructions, Post Sedation.  Post Procedure Instructions:  The drugs you were given will stay in your system until tomorrow, so for the next 24 hours you should not drive, make any legal decisions or drink any alcoholic  beverages.  You may eat anything you prefer, but it is better to start with liquids then soups and crackers, and gradually work up to solid foods.  Please notify your doctor immediately if you have any unusual bleeding, trouble breathing or pain that is not related to your normal pain.  Depending on the type of procedure that was done, some parts of your body may feel week and/or numb.  This usually clears up by tonight or the next day.  Walk with the use of an assistive device or accompanied by an adult for the 24 hours.  You may use ice on the affected area for the first 24 hours.  Put ice in a Ziploc bag and cover with a towel and place against area 15 minutes on 15 minutes off.  You may switch to heat after 24 hours. 

## 2015-07-31 NOTE — Progress Notes (Signed)
Safety precautions to be maintained throughout the outpatient stay will include: orient to surroundings, keep bed in low position, maintain call bell within reach at all times, provide assistance with transfer out of bed and ambulation.  

## 2015-08-01 ENCOUNTER — Telehealth: Payer: Self-pay | Admitting: *Deleted

## 2015-08-01 NOTE — Telephone Encounter (Signed)
Patient verbalizes no complaints from procedure on yesterday.

## 2015-08-04 ENCOUNTER — Ambulatory Visit: Payer: Medicare (Managed Care) | Admitting: Anesthesiology

## 2015-08-04 ENCOUNTER — Ambulatory Visit
Admission: RE | Admit: 2015-08-04 | Discharge: 2015-08-04 | Disposition: A | Discharge status: Home / Self Care | Payer: Medicare (Managed Care) | Source: Ambulatory Visit | Attending: Ophthalmology | Admitting: Ophthalmology

## 2015-08-04 ENCOUNTER — Encounter
Admission: RE | Disposition: A | Discharge status: Home / Self Care | Payer: Self-pay | Source: Ambulatory Visit | Attending: Ophthalmology

## 2015-08-04 DIAGNOSIS — Z9981 Dependence on supplemental oxygen: Secondary | ICD-10-CM | POA: Diagnosis not present

## 2015-08-04 DIAGNOSIS — E1140 Type 2 diabetes mellitus with diabetic neuropathy, unspecified: Secondary | ICD-10-CM | POA: Diagnosis not present

## 2015-08-04 DIAGNOSIS — Z87891 Personal history of nicotine dependence: Secondary | ICD-10-CM | POA: Diagnosis not present

## 2015-08-04 DIAGNOSIS — M1990 Unspecified osteoarthritis, unspecified site: Secondary | ICD-10-CM | POA: Insufficient documentation

## 2015-08-04 DIAGNOSIS — I10 Essential (primary) hypertension: Secondary | ICD-10-CM | POA: Insufficient documentation

## 2015-08-04 DIAGNOSIS — H9190 Unspecified hearing loss, unspecified ear: Secondary | ICD-10-CM | POA: Insufficient documentation

## 2015-08-04 DIAGNOSIS — E7800 Pure hypercholesterolemia, unspecified: Secondary | ICD-10-CM | POA: Insufficient documentation

## 2015-08-04 DIAGNOSIS — G2581 Restless legs syndrome: Secondary | ICD-10-CM | POA: Insufficient documentation

## 2015-08-04 DIAGNOSIS — G4730 Sleep apnea, unspecified: Secondary | ICD-10-CM | POA: Diagnosis not present

## 2015-08-04 DIAGNOSIS — H2512 Age-related nuclear cataract, left eye: Secondary | ICD-10-CM | POA: Diagnosis not present

## 2015-08-04 HISTORY — PX: CATARACT EXTRACTION EXTRACAPSULAR: SHX1305

## 2015-08-04 HISTORY — DX: Presence of dental prosthetic device (complete) (partial): Z97.2

## 2015-08-04 LAB — GLUCOSE, CAPILLARY: GLUCOSE-CAPILLARY: 196 mg/dL — AB (ref 65–99)

## 2015-08-04 SURGERY — EXTRACTION, CATARACT, WITH IOL INSERTION
Anesthesia: Monitor Anesthesia Care | Laterality: Left | Wound class: Clean

## 2015-08-04 MED ORDER — CEFUROXIME OPHTHALMIC INJECTION 1 MG/0.1 ML
INJECTION | OPHTHALMIC | Status: DC | PRN
Start: 2015-08-04 — End: 2015-08-04
  Administered 2015-08-04: 0.1 mL via INTRACAMERAL

## 2015-08-04 MED ORDER — TIMOLOL MALEATE 0.5 % OP SOLN
OPHTHALMIC | Status: DC | PRN
  Administered 2015-08-04: 1 [drp] via OPHTHALMIC

## 2015-08-04 MED ORDER — TETRACAINE HCL 0.5 % OP SOLN
1.0000 [drp] | OPHTHALMIC | Status: DC | PRN
  Administered 2015-08-04: 1 [drp] via OPHTHALMIC

## 2015-08-04 MED ORDER — NA HYALUR & NA CHOND-NA HYALUR 0.4-0.35 ML IO KIT
PACK | INTRAOCULAR | Status: DC | PRN
  Administered 2015-08-04: 1 mL via INTRAOCULAR

## 2015-08-04 MED ORDER — MIDAZOLAM HCL 2 MG/2ML IJ SOLN
INTRAMUSCULAR | Status: DC | PRN
  Administered 2015-08-04: 2 mg via INTRAVENOUS

## 2015-08-04 MED ORDER — ARMC OPHTHALMIC DILATING GEL
1.0000 "application " | OPHTHALMIC | Status: DC | PRN
  Administered 2015-08-04 (×2): 1 via OPHTHALMIC

## 2015-08-04 MED ORDER — LIDOCAINE HCL (PF) 4 % IJ SOLN
INTRAOCULAR | Status: DC | PRN
  Administered 2015-08-04: 1 mL via OPHTHALMIC

## 2015-08-04 MED ORDER — EPINEPHRINE HCL 1 MG/ML IJ SOLN
INTRAOCULAR | Status: DC | PRN
  Administered 2015-08-04: 83 mL via OPHTHALMIC

## 2015-08-04 MED ORDER — BRIMONIDINE TARTRATE 0.2 % OP SOLN
OPHTHALMIC | Status: DC | PRN
  Administered 2015-08-04: 1 [drp] via OPHTHALMIC

## 2015-08-04 MED ORDER — POVIDONE-IODINE 5 % OP SOLN
1.0000 "application " | OPHTHALMIC | Status: DC | PRN
  Administered 2015-08-04: 1 via OPHTHALMIC

## 2015-08-04 MED ORDER — FENTANYL CITRATE (PF) 100 MCG/2ML IJ SOLN
INTRAMUSCULAR | Status: DC | PRN
  Administered 2015-08-04: 50 ug via INTRAVENOUS

## 2015-08-04 MED ORDER — LACTATED RINGERS IV SOLN: INTRAVENOUS | Status: DC

## 2015-08-04 SURGICAL SUPPLY — 35 items
APL FBRTP 3 NS LF CTTN WD (MISCELLANEOUS) ×1
APPLICATOR COTTON TIP 3IN (MISCELLANEOUS) ×3 IMPLANT
CANNULA ANT/CHMB 27G (MISCELLANEOUS) ×1 IMPLANT
CANNULA ANT/CHMB 27GA (MISCELLANEOUS) ×3 IMPLANT
DISSECTOR HYDRO NUCLEUS 50X22 (MISCELLANEOUS) ×3 IMPLANT
GLOVE BIO SURGEON STRL SZ7 (GLOVE) ×3 IMPLANT
GLOVE SURG LX 6.5 MICRO (GLOVE) ×2
GLOVE SURG LX STRL 6.5 MICRO (GLOVE) ×1 IMPLANT
GOWN STRL REUS W/ TWL LRG LVL3 (GOWN DISPOSABLE) ×2 IMPLANT
GOWN STRL REUS W/TWL LRG LVL3 (GOWN DISPOSABLE) ×6
KIT ROOM TURNOVER OR (KITS) ×1 IMPLANT
LENS IOL ACRSF IQ PC 20.5 (Intraocular Lens) IMPLANT
LENS IOL ACRYSOF IQ POST 20.5 (Intraocular Lens) ×3 IMPLANT
MARKER SKIN SURG W/RULER VIO (MISCELLANEOUS) ×3 IMPLANT
NDL FILTER BLUNT 18X1 1/2 (NEEDLE) ×1 IMPLANT
NEEDLE FILTER BLUNT 18X 1/2SAF (NEEDLE) ×4
NEEDLE FILTER BLUNT 18X1 1/2 (NEEDLE) ×2 IMPLANT
PACK CATARACT BRASINGTON (MISCELLANEOUS) ×3 IMPLANT
PACK EYE AFTER SURG (MISCELLANEOUS) ×3 IMPLANT
PACK OPTHALMIC (MISCELLANEOUS) ×3 IMPLANT
RETRACT IRIS FLEX HOOK GRIESHA (MISCELLANEOUS) ×3
RETRACTOR IRIS FLX HOOK GRIESH (MISCELLANEOUS) IMPLANT
RING MALYGIN 7.0 (MISCELLANEOUS) IMPLANT
SOL BAL SALT 15ML (MISCELLANEOUS)
SOLUTION BAL SALT 15ML (MISCELLANEOUS) IMPLANT
SUT ETHILON 10-0 CS-B-6CS-B-6 (SUTURE)
SUT VICRYL  9 0 (SUTURE)
SUT VICRYL 9 0 (SUTURE) IMPLANT
SUTURE EHLN 10-0 CS-B-6CS-B-6 (SUTURE) IMPLANT
SYR 3ML LL SCALE MARK (SYRINGE) ×5 IMPLANT
SYR TB 1ML LUER SLIP (SYRINGE) ×3 IMPLANT
WATER STERILE IRR 250ML POUR (IV SOLUTION) ×3 IMPLANT
WATER STERILE IRR 500ML POUR (IV SOLUTION) IMPLANT
WICK EYE OCUCEL (MISCELLANEOUS) IMPLANT
WIPE NON LINTING 3.25X3.25 (MISCELLANEOUS) ×3 IMPLANT

## 2015-08-04 NOTE — Anesthesia Preprocedure Evaluation (Signed)
Anesthesia Evaluation  Patient identified by MRN, date of birth, ID band Patient awake    Airway Mallampati: II  TM Distance: >3 FB Neck ROM: Full    Dental  (+) Upper Dentures, Lower Dentures   Pulmonary sleep apnea , former smoker,           Cardiovascular hypertension,      Neuro/Psych    GI/Hepatic   Endo/Other  diabetes, Well Controlled, Type 2  Renal/GU      Musculoskeletal  (+) Arthritis , Osteoarthritis,    Abdominal   Peds  Hematology   Anesthesia Other Findings   Reproductive/Obstetrics                             Anesthesia Physical Anesthesia Plan  ASA: III  Anesthesia Plan: MAC   Post-op Pain Management:    Induction: Intravenous  Airway Management Planned:   Additional Equipment:   Intra-op Plan:   Post-operative Plan:   Informed Consent: I have reviewed the patients History and Physical, chart, labs and discussed the procedure including the risks, benefits and alternatives for the proposed anesthesia with the patient or authorized representative who has indicated his/her understanding and acceptance.     Plan Discussed with: CRNA  Anesthesia Plan Comments:         Anesthesia Quick Evaluation

## 2015-08-04 NOTE — Anesthesia Procedure Notes (Signed)
Procedure Name: MAC Performed by: Miriana Gaertner Pre-anesthesia Checklist: Patient identified, Emergency Drugs available, Suction available, Timeout performed and Patient being monitored Patient Re-evaluated:Patient Re-evaluated prior to inductionOxygen Delivery Method: Nasal cannula Placement Confirmation: positive ETCO2       

## 2015-08-04 NOTE — Anesthesia Postprocedure Evaluation (Signed)
  Anesthesia Post-op Note  Patient: James Friedman  Procedure(s) Performed: Procedure(s) with comments: CATARACT EXTRACTION EXTRACAPSULAR WITH INTRAOCULAR LENS PLACEMENT (IOC) (Left) - DIABETIC - insulin and oral meds  Anesthesia type:MAC  Patient location: PACU  Post pain: Pain level controlled  Post assessment: Post-op Vital signs reviewed, Patient's Cardiovascular Status Stable, Respiratory Function Stable, Patent Airway and No signs of Nausea or vomiting  Post vital signs: Reviewed and stable  Last Vitals:  Filed Vitals:   08/04/15 0915  BP: 129/85  Pulse: 79  Temp:   Resp: 14    Level of consciousness: awake, alert  and patient cooperative  Complications: No apparent anesthesia complications

## 2015-08-04 NOTE — H&P (Signed)
H+P reviewed and is up to date, please see paper chart.  

## 2015-08-04 NOTE — Op Note (Signed)
Date of Surgery: 08/04/2015  PREOPERATIVE DIAGNOSES: Visually significant nuclear sclerotic cataract, left eye.  POSTOPERATIVE DIAGNOSES: Same  PROCEDURES PERFORMED: Cataract extraction with intraocular lens implant, left eye.  SURGEON: Devin GoingAnita P. Vin, M.D.  ANESTHESIA: MAC and topical  IMPLANTS: AcrySof IQ [id number] +20.5 D  Implant Name Type Inv. Item Serial No. Manufacturer Lot No. LRB No. 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  DESCRIPTION OF PROCEDURE: Therapeutic options were discussed with the patient preoperatively, including a discussion of risks and benefits of surgery. Informed consent was obtained. An IOL-Master and immersion biometry were used to take the lens measurements, and a dilated fundus exam was performed within 6 months of the surgical date.  The patient was premedicated and brought to the operating room and placed on the operating table in the supine position. After adequate anesthesia, the patient was prepped and draped in the usual sterile ophthalmic fashion. A wire lid speculum was inserted and the microscope was positioned. A Superblade was used to create a paracentesis site at the limbus and a small amount of dilute preservative free lidocaine was instilled into the anterior chamber, followed by dispersive viscoelastic. A clear corneal incision was created temporally using a 2.4 mm keratome blade. Capsulorrhexis was then performed. The temporal iris had the tendency to prolapse through the temporal wound so iris hooks were placed to facilitate the view to complete phacoemulsification. In situ phacoemulsification was performed.  Cortical material was removed with the irrigation-aspiration unit. Dispersive viscoelastic was instilled to open the capsular bag. A posterior chamber intraocular lens with the specifications above was inserted and  positioned. Iris hooks were removed. Irrigation-aspiration was used to remove all viscoelastic. Cefuroxime 1cc was instilled into the anterior chamber, and the corneal incision was checked and found to be water tight. The eyelid speculum was removed.  The operative eye was covered with protective goggles after instilling 1 drop of timolol and brimonidine. The patient tolerated the procedure well. There were no complications.

## 2015-08-04 NOTE — Transfer of Care (Signed)
Immediate Anesthesia Transfer of Care Note  Patient: James Friedman  Procedure(s) Performed: Procedure(s) with comments: CATARACT EXTRACTION EXTRACAPSULAR WITH INTRAOCULAR LENS PLACEMENT (IOC) (Left) - DIABETIC - insulin and oral meds  Patient Location: PACU  Anesthesia Type: MAC  Level of Consciousness: awake, alert  and patient cooperative  Airway and Oxygen Therapy: Patient Spontanous Breathing and Patient connected to supplemental oxygen  Post-op Assessment: Post-op Vital signs reviewed, Patient's Cardiovascular Status Stable, Respiratory Function Stable, Patent Airway and No signs of Nausea or vomiting  Post-op Vital Signs: Reviewed and stable  Complications: No apparent anesthesia complications

## 2015-08-05 ENCOUNTER — Encounter: Payer: Self-pay | Admitting: Ophthalmology

## 2015-08-05 LAB — GLUCOSE, CAPILLARY: GLUCOSE-CAPILLARY: 199 mg/dL — AB (ref 65–99)

## 2015-08-11 ENCOUNTER — Other Ambulatory Visit: Payer: Self-pay | Admitting: Pain Medicine

## 2015-08-20 ENCOUNTER — Encounter: Payer: Self-pay | Admitting: Pain Medicine

## 2015-08-20 ENCOUNTER — Ambulatory Visit: Payer: Medicare (Managed Care) | Attending: Pain Medicine | Admitting: Pain Medicine

## 2015-08-20 VITALS — BP 116/79 | HR 78 | Temp 98.3°F | Resp 20 | Ht 69.0 in | Wt 292.0 lb

## 2015-08-20 DIAGNOSIS — M542 Cervicalgia: Secondary | ICD-10-CM | POA: Diagnosis not present

## 2015-08-20 DIAGNOSIS — M47896 Other spondylosis, lumbar region: Secondary | ICD-10-CM

## 2015-08-20 DIAGNOSIS — M545 Low back pain: Secondary | ICD-10-CM

## 2015-08-20 DIAGNOSIS — F419 Anxiety disorder, unspecified: Secondary | ICD-10-CM | POA: Diagnosis not present

## 2015-08-20 DIAGNOSIS — M47812 Spondylosis without myelopathy or radiculopathy, cervical region: Secondary | ICD-10-CM

## 2015-08-20 DIAGNOSIS — Z87891 Personal history of nicotine dependence: Secondary | ICD-10-CM | POA: Diagnosis not present

## 2015-08-20 DIAGNOSIS — M5412 Radiculopathy, cervical region: Secondary | ICD-10-CM

## 2015-08-20 DIAGNOSIS — I10 Essential (primary) hypertension: Secondary | ICD-10-CM | POA: Diagnosis not present

## 2015-08-20 DIAGNOSIS — M25569 Pain in unspecified knee: Secondary | ICD-10-CM | POA: Diagnosis present

## 2015-08-20 DIAGNOSIS — N400 Enlarged prostate without lower urinary tract symptoms: Secondary | ICD-10-CM | POA: Diagnosis not present

## 2015-08-20 DIAGNOSIS — E785 Hyperlipidemia, unspecified: Secondary | ICD-10-CM | POA: Diagnosis not present

## 2015-08-20 DIAGNOSIS — E119 Type 2 diabetes mellitus without complications: Secondary | ICD-10-CM | POA: Diagnosis not present

## 2015-08-20 DIAGNOSIS — G8929 Other chronic pain: Secondary | ICD-10-CM

## 2015-08-20 DIAGNOSIS — M47816 Spondylosis without myelopathy or radiculopathy, lumbar region: Secondary | ICD-10-CM | POA: Insufficient documentation

## 2015-08-20 DIAGNOSIS — M1990 Unspecified osteoarthritis, unspecified site: Secondary | ICD-10-CM | POA: Insufficient documentation

## 2015-08-20 DIAGNOSIS — M549 Dorsalgia, unspecified: Secondary | ICD-10-CM | POA: Diagnosis present

## 2015-08-20 DIAGNOSIS — M4726 Other spondylosis with radiculopathy, lumbar region: Secondary | ICD-10-CM

## 2015-08-20 DIAGNOSIS — G5601 Carpal tunnel syndrome, right upper limb: Secondary | ICD-10-CM | POA: Diagnosis not present

## 2015-08-20 NOTE — Progress Notes (Signed)
Patient's Name: James Friedman MRN: [id number] DOB: 01-16-1946 DOS: 08/20/2015  Primary Reason(s) for Visit: Post-Procedure evaluation CC: Back Pain and Knee Pain   HPI:   Mr. Petrovich is a 69 y.o. year old, male patient, who returns today as an established patient. He has Hyperlipidemia; Hypertension; Chest pain; Chronic pain; Pain in shoulder (Bilateral) (L>R); Non-traumatic rotator cuff tear; Chronic pain syndrome; Chronic low back pain (Bilateral); Lumbar spondylosis; Lumbosacral radiculopathy at S1 (Right side); Lumbar facet syndrome (Bilateral); Chronic neck pain; Chronic radicular cervical pain (Bilateral) (L>R); Cervical spondylosis; Morbid obesity ([place]); Long term current use of opiate analgesic; Long term prescription opiate use; Opiate use; Opiate dependence ([place]); Chronic sacroiliac joint pain (R>L); Failed neck surgery syndrome; Chronic upper extremity pain (Bilateral) (L>R); History of bilateral knee surgery; Diffuse myofascial pain syndrome; Chronic right sacroiliac joint pain; Lumbar facet hypertrophy (L4-5 and L5-S1); Carpal tunnel syndrome on right (Released); Transient weakness of left lower extremity; and At high risk for falls on his problem list.. His primarily concern today is the Back Pain and Knee Pain     The patient returns today for follow-up evaluation after a diagnostic bilateral lumbar facet block under fluoroscopic guidance. The patient obtained 80-90% relief of his low back pain for the duration of the local anesthetic and 50% relief of the pain that continues to be ongoing from the steroids. He has had this diagnostic injection done multiple times with similar results and therefore at this point I believe it to be medically necessary for him to undergo bilateral lumbar facet radiofrequency ablation of the lumbar medial branch. This would help in lowering some of his pain and therefore his pain medication requirements. This would also allow him to do more activities that  he can continue losing weight. Since I met him he has lost quite a bit of weight, but he still has some ways to go. Because lumbar facet syndrome actually causes more pain with physical therapy, it is an impediment for him to continue improving. Therefore, we need to work on eliminating this facet joint pain so that he can pick up his physical therapy and be able to do it with less pain.  Today we had a long conversation with her as to the pain medications and how to handle tolerance when you have chronic pain. He is well aware of the "Drug Holiday" concepts and how to use it.  In addition to his low back pain, he has osteoarthrosis and degenerative disc disease of the cervical spine which currently is causing a right sided cervical radiculitis for which she has requested that we repeat his right-sided cervical epidural steroid injection. We will go ahead and schedule this as soon as possible. He has also requested that we treat his knees, which are also giving him problems secondary to the osteoarthritis. He is well aware that all of these things are happening secondary to his morbid obesity and he is working on it. He really wants to stay away from surgery she followed all possible.  Today's Pain Score: 5 , clinically he looks like a 1-2/10. Reported level of pain is incompatible with clinical obrservations. This may be secondary to a possible lack of understanding on how the pain scale works. Pain Type: Chronic pain Pain Location: Back (knees) Pain Orientation: Lower Pain Descriptors / Indicators: Aching Pain Frequency: Constant  Date of Last Visit: 07/31/15 Service Provided on Last Visit:  (BLF)  Pharmacotherapy Review: The patient's medication management is currently not under our care.  Last  Available Lab Work: Admission on 08/04/2015, Discharged on 08/04/2015  Component Date Value Ref Range Status  . Glucose-Capillary 08/04/2015 196* 65 - 99 mg/dL Final  . Glucose-Capillary 08/04/2015  199* 65 - 99 mg/dL Final  Orders Only on 07/17/2015  Component Date Value Ref Range Status  . Report Summary 07/17/2015 FINAL   Final  . PDF 07/17/2015 .   Final    Procedure Assessment:  Procedure done on last visit: Bilateral diagnostic lumbar facet block under fluoroscopic guidance and IV sedation. Side-effects or Adverse reactions: None reported. Sedation: Procedure was performed with sedation.  Results: Ultra-Short Term Relief (First 1 hour after procedure): 80 % Short Term Relief (Initial 4-6 hrs after procedure): 90 % Long Term Relief : 100 % (for first week, then decreased to 50%  ongoing )  Current Relief (Now):  50% Interpretation of Results: Ultra-short term relief is a normal physiological response to analgesics and anesthetics provided during the procedure. Short-term relief confirms injected site as etiology of pain. Long term relief is possibly due to sympathetic blockade, or the effects of steroids, if administered during procedure. Persistent relief would suggest effective anti-inflammatory effects from steroids.  At this point we will have him return for a right-sided cervical epidural steroid injection under fluoroscopic guidance. After that, we will go ahead and schedule him to return for bilateral knee interventional treatments.  Allergies: Mr. Olliff has No Known Allergies.  Meds: The patient has a current medication list which includes the following prescription(s): albuterol, amlodipine, aspirin, atorvastatin, azelastine, capsaicin, cetirizine, chlorpheniramine, vitamin d3, clotrimazole, duloxetine, gabapentin, hydroxyzine, insulin aspart, insulin detemir, lidocaine, magnesium, metformin, metformin, metoprolol, morphine, multivitamin, naproxen, fish oil, omeprazole, oxycodone hcl, oxygen-helium, testosterone cypionate, triamcinolone cream, vitamin c, and trazodone, and the following Facility-Administered Medications: fentanyl, lidocaine (pf), midazolam, ropivacaine  (pf) 2 mg/ml (0.2%), and triamcinolone acetonide. Requested Prescriptions    No prescriptions requested or ordered in this encounter    ROS: Constitutional: Afebrile, no chills, well hydrated and well nourished Gastrointestinal: negative Musculoskeletal:negative Neurological: negative Behavioral/Psych: negative  PFSH: Medical:  Mr. Poplaski  has a past medical history of Morbid obesity ([place]); Diabetes mellitus without complication ([place]); DDD (degenerative disc disease); Carpal tunnel syndrome; OA (osteoarthritis); OSA (obstructive sleep apnea); Anxiety; BPH (benign prostatic hypertrophy); Ventral hernia; Heme positive stool; Asthma; Hyperlipidemia; Hypertension; History of bilateral knee surgery (07/17/2015); Carpal tunnel syndrome on right (07/17/2015); and Wears dentures. Family: family history includes Heart attack in his father. Surgical:  has past surgical history that includes Knee surgery (Bilateral); Neck surgery; Carpal tunnel release; Shoulder surgery (Right); and Cataract extraction, extracapsular (Left, 08/04/2015). Tobacco:  reports that he has quit smoking. His smoking use included Cigarettes. He has a 3.75 pack-year smoking history. He does not have any smokeless tobacco history on file. Alcohol:  reports that he does not drink alcohol. Drug:  reports that he does not use illicit drugs.  Physical Exam: Vitals:  Today's Vitals   08/20/15 0946 08/20/15 0948  BP: 116/79   Pulse: 78   Temp: 98.3 F (36.8 C)   Resp: 20   Height: '5\' 9"'  (1.753 m)   Weight: 292 lb (132.45 kg)   SpO2: 96%   PainSc: 5  5   PainLoc: Back   Calculated BMI: Body mass index is 43.1 kg/(m^2). General appearance: alert, cooperative, appears stated age, no distress and morbidly obese Eyes: conjunctivae/corneas clear. PERRL, EOM's intact. Fundi benign. Lungs: No evidence respiratory distress, no audible rales or ronchi and no use of accessory muscles of respiration Neck:  no adenopathy, no carotid  bruit, no JVD, supple, symmetrical, trachea midline and thyroid not enlarged, symmetric, no tenderness/mass/nodules Back: Pain on hyperextension and rotation of the lumbar spine compatible with bilateral lumbar facet syndrome. Extremities: Bilateral lower extremity edema difficult to assess due to body habitus. Pulses: 2+ and symmetric Skin: Skin color, texture, turgor normal. No rashes or lesions Neurologic: Gait: Antalgic    Assessment: Encounter Diagnosis:  Primary Diagnosis: Chronic pain [G89.29]  Plan: Shail was seen today for back pain and knee pain.  Diagnoses and all orders for this visit:  Chronic pain  Cervical spondylosis -     CERVICAL EPIDURAL STEROID INJECTION; Future  Chronic neck pain -     CERVICAL EPIDURAL STEROID INJECTION; Future  Chronic radicular cervical pain (Bilateral) (L>R) -     CERVICAL EPIDURAL STEROID INJECTION; Future  Lumbar facet hypertrophy (L4-5 and L5-S1) -     RADIOFREQUENCY, CERVICAL THORACIC LUMBER, GENICULAR ; Future  Lumbar facet syndrome (Bilateral) -     RADIOFREQUENCY, CERVICAL THORACIC LUMBER, GENICULAR ; Future  Osteoarthritis of spine with radiculopathy, lumbar region     Patient Instructions  Radiofrequency Lesioning Radiofrequency lesioning is a procedure that is performed to relieve pain. The procedure is often used for back, neck, or arm pain. Radiofrequency lesioning involves the use of a machine that creates radio waves to make heat. During the procedure, the heat is applied to the nerve that carries the pain signal. The heat damages the nerve and interferes with the pain signal. Pain relief usually lasts for 6 months to 1 year. LET [facility] CARE PROVIDER KNOW ABOUT:  Any allergies you have.  All medicines you are taking, including vitamins, herbs, eye drops, creams, and over-the-counter medicines.  Previous problems you or members of your family have had with the use of anesthetics.  Any blood disorders you  have.  Previous surgeries you have had.  Any medical conditions you have.  Whether you are pregnant or may be pregnant. RISKS AND COMPLICATIONS Generally, this is a safe procedure. However, problems may occur, including:  Pain or soreness at the injection site.  Infection at the injection site.  Damage to nerves or blood vessels. BEFORE THE PROCEDURE  Ask your health care provider about:  Changing or stopping your regular medicines. This is especially important if you are taking diabetes medicines or blood thinners.  Taking medicines such as aspirin and ibuprofen. These medicines can thin your blood. Do not take these medicines before your procedure if your health care provider instructs you not to.  Follow instructions from your health care provider about eating or drinking restrictions.  Plan to have someone take you home after the procedure.  If you go home right after the procedure, plan to have someone with you for 24 hours. PROCEDURE  You will be given one or more of the following:  A medicine to help you relax (sedative).  A medicine to numb the area (local anesthetic).  You will be awake during the procedure. You will need to be able to talk with the health care provider during the procedure.  With the help of a type of X-ray (fluoroscopy), the health care provider will insert a radiofrequency needle into the area to be treated.  Next, a wire that carries the radio waves (electrode) will be put through the radiofrequency needle. An electrical pulse will be sent through the electrode to verify the correct nerve. You will feel a tingling sensation, and you may have muscle twitching.  Then, the tissue that is around the needle tip will be heated by an electric current that is passed using the radiofrequency machine. This will numb the nerves.  A bandage (dressing) will be put on the insertion area after the procedure is done. The procedure may vary among health care  providers and hospitals. AFTER THE PROCEDURE  Your blood pressure, heart rate, breathing rate, and blood oxygen level will be monitored often until the medicines you were given have worn off.  Return to your normal activities as directed by your health care provider.   This information is not intended to replace advice given to you by your health care provider. Make sure you discuss any questions you have with your health care provider.   Document Released: 05/05/2011 Document Revised: 05/28/2015 Document Reviewed: 10/14/2014 Elsevier Interactive Patient Education 2016 [employer] Epidural Steroid Injection Patient Information  Description: The epidural space surrounds the nerves as they exit the spinal cord.  In some patients, the nerves can be compressed and inflamed by a bulging disc or a tight spinal canal (spinal stenosis).  By injecting steroids into the epidural space, we can bring irritated nerves into direct contact with a potentially helpful medication.  These steroids act directly on the irritated nerves and can reduce swelling and inflammation which often leads to decreased pain.  Epidural steroids may be injected anywhere along the spine and from the neck to the low back depending upon the location of your pain.   After numbing the skin with local anesthetic (like Novocaine), a small needle is passed into the epidural space slowly.  You may experience a sensation of pressure while this is being done.  The entire block usually last less than 10 minutes.  Conditions which may be treated by epidural steroids:   Low back and leg pain  Neck and arm pain  Spinal stenosis  Post-laminectomy syndrome  Herpes zoster (shingles) pain  Pain from compression fractures  Preparation for the injection:   Do not eat any solid food or dairy products within 6 hours of your appointment.   You may drink clear liquids up to 2 hours before appointment.  Clear liquids include water, black  coffee, juice or soda.  No milk or cream please.  You may take your regular medication, including pain medications, with a sip of water before your appointment  Diabetics should hold regular insulin (if taken separately) and take 1/2 normal NPH dos the morning of the procedure.  Carry some sugar containing items with you to your appointment.  A driver must accompany you and be prepared to drive you home after your procedure.   Bring all your current medications with your.  An IV may be inserted and sedation may be given at the discretion of the physician.    A blood pressure cuff, EKG and other monitors will often be applied during the procedure.  Some patients may need to have extra oxygen administered for a short period.  You will be asked to provide medical information, including your allergies, prior to the procedure.  We must know immediately if you are taking blood thinners (like Coumadin/Warfarin)  Or if you are allergic to IV iodine contrast (dye). We must know if you could possible be pregnant.  Possible side-effects:  Bleeding from needle site  Infection (rare, may require surgery)  Nerve injury (rare)  Numbness & tingling (temporary)  Difficulty urinating (rare, temporary)  Spinal headache ( a headache worse with upright posture)  Light -headedness (temporary)  Pain at injection site (several days)  Decreased blood pressure (temporary)  Weakness in arm/leg (temporary)  Pressure sensation in back/neck (temporary)  Call if you experience:  Fever/chills associated with headache or increased back/neck pain.  Headache worsened by an upright position.  New onset weakness or numbness of an extremity below the injection site  Hives or difficulty breathing (go to the emergency room)  Inflammation or drainage at the infection site  Severe back/neck pain  Any new symptoms which are concerning to you  Please note:  Although the local anesthetic injected can  often make your back or neck feel good for several hours after the injection, the pain will likely return.  It takes 3-7 days for steroids to work in the epidural space.  You may not notice any pain relief for at least that one week.  If effective, we will often do a series of three injections spaced 3-6 weeks apart to maximally decrease your pain.  After the initial series, we generally will wait several months before considering a repeat injection of the same type.  If you have any questions, please call [phone] [facility]   Medications discontinued today:  There are no discontinued medications. Medications administered today:  Mr. Brindle had no medications administered during this visit.  Primary Care Physician: Baltazar Apo, MD Location: [facility] Note by: Kathlen Brunswick. Dossie Arbour, M.D, DABA, DABAPM, DABPM, DABIPP, FIPP

## 2015-08-20 NOTE — Patient Instructions (Signed)
Radiofrequency Lesioning Radiofrequency lesioning is a procedure that is performed to relieve pain. The procedure is often used for back, neck, or arm pain. Radiofrequency lesioning involves the use of a machine that creates radio waves to make heat. During the procedure, the heat is applied to the nerve that carries the pain signal. The heat damages the nerve and interferes with the pain signal. Pain relief usually lasts for 6 months to 1 year. LET [facility] PROVIDER KNOW ABOUT:  Any allergies you have.  All medicines you are taking, including vitamins, herbs, eye drops, creams, and over-the-counter medicines.  Previous problems you or members of your family have had with the use of anesthetics.  Any blood disorders you have.  Previous surgeries you have had.  Any medical conditions you have.  Whether you are pregnant or may be pregnant. RISKS AND COMPLICATIONS Generally, this is a safe procedure. However, problems may occur, including:  Pain or soreness at the injection site.  Infection at the injection site.  Damage to nerves or blood vessels. BEFORE THE PROCEDURE  Ask your health care provider about:  Changing or stopping your regular medicines. This is especially important if you are taking diabetes medicines or blood thinners.  Taking medicines such as aspirin and ibuprofen. These medicines can thin your blood. Do not take these medicines before your procedure if your health care provider instructs you not to.  Follow instructions from your health care provider about eating or drinking restrictions.  Plan to have someone take you home after the procedure.  If you go home right after the procedure, plan to have someone with you for 24 hours. PROCEDURE  You will be given one or more of the following:  A medicine to help you relax (sedative).  A medicine to numb the area (local anesthetic).  You will be awake during the procedure. You will need to be able to  talk with the health care provider during the procedure.  With the help of a type of X-ray (fluoroscopy), the health care provider will insert a radiofrequency needle into the area to be treated.  Next, a wire that carries the radio waves (electrode) will be put through the radiofrequency needle. An electrical pulse will be sent through the electrode to verify the correct nerve. You will feel a tingling sensation, and you may have muscle twitching.  Then, the tissue that is around the needle tip will be heated by an electric current that is passed using the radiofrequency machine. This will numb the nerves.  A bandage (dressing) will be put on the insertion area after the procedure is done. The procedure may vary among health care providers and hospitals. AFTER THE PROCEDURE  Your blood pressure, heart rate, breathing rate, and blood oxygen level will be monitored often until the medicines you were given have worn off.  Return to your normal activities as directed by your health care provider.   This information is not intended to replace advice given to you by your health care provider. Make sure you discuss any questions you have with your health care provider.   Document Released: 05/05/2011 Document Revised: 05/28/2015 Document Reviewed: 10/14/2014 Elsevier Interactive Patient Education 2016 [employer] Epidural Steroid Injection Patient Information  Description: The epidural space surrounds the nerves as they exit the spinal cord.  In some patients, the nerves can be compressed and inflamed by a bulging disc or a tight spinal canal (spinal stenosis).  By injecting steroids into the epidural space, we  can bring irritated nerves into direct contact with a potentially helpful medication.  These steroids act directly on the irritated nerves and can reduce swelling and inflammation which often leads to decreased pain.  Epidural steroids may be injected anywhere along the spine and from the  neck to the low back depending upon the location of your pain.   After numbing the skin with local anesthetic (like Novocaine), a small needle is passed into the epidural space slowly.  You may experience a sensation of pressure while this is being done.  The entire block usually last less than 10 minutes.  Conditions which may be treated by epidural steroids:   Low back and leg pain  Neck and arm pain  Spinal stenosis  Post-laminectomy syndrome  Herpes zoster (shingles) pain  Pain from compression fractures  Preparation for the injection:   Do not eat any solid food or dairy products within 6 hours of your appointment.   You may drink clear liquids up to 2 hours before appointment.  Clear liquids include water, black coffee, juice or soda.  No milk or cream please.  You may take your regular medication, including pain medications, with a sip of water before your appointment  Diabetics should hold regular insulin (if taken separately) and take 1/2 normal NPH dos the morning of the procedure.  Carry some sugar containing items with you to your appointment.  A driver must accompany you and be prepared to drive you home after your procedure.   Bring all your current medications with your.  An IV may be inserted and sedation may be given at the discretion of the physician.    A blood pressure cuff, EKG and other monitors will often be applied during the procedure.  Some patients may need to have extra oxygen administered for a short period.  You will be asked to provide medical information, including your allergies, prior to the procedure.  We must know immediately if you are taking blood thinners (like Coumadin/Warfarin)  Or if you are allergic to IV iodine contrast (dye). We must know if you could possible be pregnant.  Possible side-effects:  Bleeding from needle site  Infection (rare, may require surgery)  Nerve injury (rare)  Numbness & tingling (temporary)  Difficulty  urinating (rare, temporary)  Spinal headache ( a headache worse with upright posture)  Light -headedness (temporary)  Pain at injection site (several days)  Decreased blood pressure (temporary)  Weakness in arm/leg (temporary)  Pressure sensation in back/neck (temporary)  Call if you experience:  Fever/chills associated with headache or increased back/neck pain.  Headache worsened by an upright position.  New onset weakness or numbness of an extremity below the injection site  Hives or difficulty breathing (go to the emergency room)  Inflammation or drainage at the infection site  Severe back/neck pain  Any new symptoms which are concerning to you  Please note:  Although the local anesthetic injected can often make your back or neck feel good for several hours after the injection, the pain will likely return.  It takes 3-7 days for steroids to work in the epidural space.  You may not notice any pain relief for at least that one week.  If effective, we will often do a series of three injections spaced 3-6 weeks apart to maximally decrease your pain.  After the initial series, we generally will wait several months before considering a repeat injection of the same type.  If you have any questions, please call [phone]  [facility]

## 2015-08-20 NOTE — Progress Notes (Signed)
Safety precautions to be maintained throughout the outpatient stay will include: orient to surroundings, keep bed in low position, maintain call bell within reach at all times, provide assistance with transfer out of bed and ambulation.  

## 2015-08-26 ENCOUNTER — Other Ambulatory Visit: Payer: Self-pay | Admitting: Pain Medicine

## 2015-08-26 DIAGNOSIS — M79605 Pain in left leg: Secondary | ICD-10-CM

## 2015-08-26 DIAGNOSIS — M79606 Pain in leg, unspecified: Secondary | ICD-10-CM

## 2015-08-26 DIAGNOSIS — M48061 Spinal stenosis, lumbar region without neurogenic claudication: Secondary | ICD-10-CM

## 2015-08-26 DIAGNOSIS — M79604 Pain in right leg: Secondary | ICD-10-CM

## 2015-08-26 DIAGNOSIS — M5126 Other intervertebral disc displacement, lumbar region: Secondary | ICD-10-CM | POA: Insufficient documentation

## 2015-08-26 DIAGNOSIS — M4310 Spondylolisthesis, site unspecified: Secondary | ICD-10-CM | POA: Insufficient documentation

## 2015-08-26 DIAGNOSIS — G8929 Other chronic pain: Secondary | ICD-10-CM | POA: Insufficient documentation

## 2015-08-26 NOTE — Progress Notes (Signed)
Quick Note:  Abnormal findings detected on recent study. ______ 

## 2015-08-28 ENCOUNTER — Ambulatory Visit: Payer: Medicare (Managed Care) | Attending: Pain Medicine | Admitting: Pain Medicine

## 2015-08-28 ENCOUNTER — Encounter: Payer: Self-pay | Admitting: Pain Medicine

## 2015-08-28 VITALS — BP 124/73 | HR 80 | Temp 98.5°F | Resp 16 | Ht 69.0 in | Wt 292.0 lb

## 2015-08-28 DIAGNOSIS — F419 Anxiety disorder, unspecified: Secondary | ICD-10-CM | POA: Insufficient documentation

## 2015-08-28 DIAGNOSIS — M4806 Spinal stenosis, lumbar region: Secondary | ICD-10-CM | POA: Diagnosis not present

## 2015-08-28 DIAGNOSIS — R531 Weakness: Secondary | ICD-10-CM | POA: Insufficient documentation

## 2015-08-28 DIAGNOSIS — G8929 Other chronic pain: Secondary | ICD-10-CM | POA: Insufficient documentation

## 2015-08-28 DIAGNOSIS — I10 Essential (primary) hypertension: Secondary | ICD-10-CM | POA: Diagnosis not present

## 2015-08-28 DIAGNOSIS — M545 Low back pain: Secondary | ICD-10-CM | POA: Diagnosis not present

## 2015-08-28 DIAGNOSIS — M5412 Radiculopathy, cervical region: Secondary | ICD-10-CM | POA: Diagnosis not present

## 2015-08-28 DIAGNOSIS — M47812 Spondylosis without myelopathy or radiculopathy, cervical region: Secondary | ICD-10-CM | POA: Insufficient documentation

## 2015-08-28 DIAGNOSIS — M4316 Spondylolisthesis, lumbar region: Secondary | ICD-10-CM | POA: Diagnosis not present

## 2015-08-28 DIAGNOSIS — M5417 Radiculopathy, lumbosacral region: Secondary | ICD-10-CM | POA: Insufficient documentation

## 2015-08-28 DIAGNOSIS — M25519 Pain in unspecified shoulder: Secondary | ICD-10-CM | POA: Diagnosis present

## 2015-08-28 DIAGNOSIS — E785 Hyperlipidemia, unspecified: Secondary | ICD-10-CM | POA: Insufficient documentation

## 2015-08-28 DIAGNOSIS — M47816 Spondylosis without myelopathy or radiculopathy, lumbar region: Secondary | ICD-10-CM | POA: Insufficient documentation

## 2015-08-28 DIAGNOSIS — M79601 Pain in right arm: Secondary | ICD-10-CM

## 2015-08-28 MED ORDER — SODIUM CHLORIDE 0.9 % IJ SOLN
INTRAMUSCULAR | Status: AC
  Administered 2015-08-28: 11:00:00
  Filled 2015-08-28: qty 10

## 2015-08-28 MED ORDER — MIDAZOLAM HCL 5 MG/5ML IJ SOLN
INTRAMUSCULAR | Status: AC
  Administered 2015-08-28: 1 mg via INTRAVENOUS
  Filled 2015-08-28: qty 5

## 2015-08-28 MED ORDER — LIDOCAINE HCL (PF) 1 % IJ SOLN
INTRAMUSCULAR | Status: AC
  Administered 2015-08-28: 11:00:00
  Filled 2015-08-28: qty 5

## 2015-08-28 MED ORDER — IOHEXOL 180 MG/ML  SOLN: 5.0000 mL | Freq: Once | INTRAMUSCULAR | Status: DC | PRN

## 2015-08-28 MED ORDER — SODIUM CHLORIDE 0.9 % IJ SOLN: 1.0000 mL | Freq: Once | INTRAMUSCULAR | Status: DC

## 2015-08-28 MED ORDER — DEXAMETHASONE SODIUM PHOSPHATE 10 MG/ML IJ SOLN
INTRAMUSCULAR | Status: AC
  Administered 2015-08-28: 11:00:00
  Filled 2015-08-28: qty 1

## 2015-08-28 MED ORDER — ROPIVACAINE HCL 2 MG/ML IJ SOLN: 1.0000 mL | Freq: Once | INTRAMUSCULAR | Status: DC

## 2015-08-28 MED ORDER — LIDOCAINE HCL (PF) 1 % IJ SOLN
10.0000 mL | Freq: Once | INTRAMUSCULAR | Status: DC
Start: 2015-08-28 — End: 2015-09-18

## 2015-08-28 MED ORDER — LACTATED RINGERS IV SOLN: 1000.0000 mL | INTRAVENOUS | Status: AC

## 2015-08-28 MED ORDER — FENTANYL CITRATE (PF) 100 MCG/2ML IJ SOLN: 100.0000 ug | Freq: Once | INTRAMUSCULAR | Status: DC

## 2015-08-28 MED ORDER — ROPIVACAINE HCL 2 MG/ML IJ SOLN
INTRAMUSCULAR | Status: AC
  Administered 2015-08-28: 11:00:00
  Filled 2015-08-28: qty 10

## 2015-08-28 MED ORDER — MIDAZOLAM HCL 5 MG/5ML IJ SOLN: 5.0000 mg | Freq: Once | INTRAMUSCULAR | Status: DC

## 2015-08-28 MED ORDER — DEXAMETHASONE SODIUM PHOSPHATE 10 MG/ML IJ SOLN: 10.0000 mg | Freq: Once | INTRAMUSCULAR | Status: DC

## 2015-08-28 MED ORDER — IOHEXOL 180 MG/ML  SOLN
INTRAMUSCULAR | Status: AC
  Administered 2015-08-28: 11:00:00
  Filled 2015-08-28: qty 20

## 2015-08-28 MED ORDER — FENTANYL CITRATE (PF) 100 MCG/2ML IJ SOLN
INTRAMUSCULAR | Status: AC
  Administered 2015-08-28: 50 ug via INTRAVENOUS
  Filled 2015-08-28: qty 2

## 2015-08-28 NOTE — Patient Instructions (Signed)
Pain Management Discharge Instructions  General Discharge Instructions :  If you need to reach your doctor call: Monday-Friday 8:00 am - 4:00 pm at [phone] or toll free [phone].  After clinic hours [phone] to have operator reach doctor.  Bring all of your medication bottles to all your appointments in the pain clinic.  To cancel or reschedule your appointment with Pain Management please remember to call 24 hours in advance to avoid a fee.  Refer to the educational materials which you have been given on: General Risks, I had my Procedure. Discharge Instructions, Post Sedation.  Post Procedure Instructions:  The drugs you were given will stay in your system until tomorrow, so for the next 24 hours you should not drive, make any legal decisions or drink any alcoholic beverages.  You may eat anything you prefer, but it is better to start with liquids then soups and crackers, and gradually work up to solid foods.  IMPORTANT: Please fill out the post procedure pain diary and bring it with you at your next appointment.  Please notify your doctor immediately if you have any unusual bleeding, trouble breathing or pain that is not related to your normal pain.  Depending on the type of procedure that was done, some parts of your body may feel week and/or numb.  This usually clears up by tonight or the next day.  Walk with the use of an assistive device or accompanied by an adult for the 24 hours.  You may use ice on the affected area for the first 24 hours.  Put ice in a Ziploc bag and cover with a towel and place against area 15 minutes on 15 minutes off.  You may switch to heat after 24 hours.  

## 2015-08-28 NOTE — Progress Notes (Signed)
Patient's Name: James Friedman MRN: [id number] DOB: [birth year] DOS: 08/28/2015  Primary Reason(s) for Visit: Interventional Pain Management Treatment. CC: Shoulder Pain   Pre-Procedure Assessment: James Friedman is a 69 y.o. year old, male patient, seen today for interventional treatment. 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 His primarily concern today is the Shoulder Pain  Verification of the correct person, correct site (including marking of site), and correct procedure were performed and confirmed by the patient. Today's Vitals   08/28/15 1059 08/28/15 1109 08/28/15 1119 08/28/15 1129  BP: 123/75 105/59 119/79 124/73  Pulse: 86 78 74 80  Temp:      TempSrc:      Resp: '16 16 16 16  ' Height:      Weight:      SpO2: 98% 98% 99% 97%  PainSc:      Calculated BMI: Body mass index is 43.1 kg/(m^2). Allergies: He has No Known  Allergies.. Primary Diagnosis: Chronic radicular cervical pain [M54.12, G89.29]  Procedure: Type: Diagnostic Inter-Laminar Epidural Steroid Injection Region: Posterior Cervical Level: C7-T1  Laterality: Right-Sided Paramedial  Indications: Spondylosis, Cervical Region  Consent: Secured. Under the influence of no sedatives a written informed consent was obtained, after having provided information on the risks and possible complications. To fulfill our ethical and legal obligations, as recommended by the American Medical Association's Code of Ethics, we have provided information to the patient about our clinical impression; the nature and purpose of the treatment or procedure; the risks, benefits, and possible complications of the intervention; alternatives; the risk(s) and benefit(s) of the alternative treatment(s) or procedure(s); and the risk(s) and benefit(s) of doing nothing. The patient was provided information about the risks and possible complications associated with the procedure. In the case of spinal procedures these may include, but are not limited to, failure to achieve desired goals, infection, bleeding, organ or nerve damage, allergic reactions, paralysis, and death. In addition, the patient was informed that Medicine is not an exact science; therefore, there is also the possibility of unforeseen risks and possible complications that may result in a catastrophic outcome. The patient indicated having understood very clearly. We have given the patient no guarantees and we have made no promises. Enough time was given to the patient to ask questions, all of which were answered to the patient's satisfaction.  Pre-Procedure Preparation: Safety Precautions: Allergies reviewed. Appropriate site, procedure, and patient were confirmed by following the Joint Commission's Universal Protocol (UP.01.01.01), in the form of a "Time Out". The patient was asked to confirm marked site and procedure, before  commencing. The patient was asked about blood thinners, or active infections, both of which were denied.  Patient was assessed for positional comfort and all pressure points were checked before starting procedure. Monitoring:  As per clinic protocol. Infection Control Precautions: Sterile technique used. Standard Universal Precautions were taken as recommended by the Department of [facility] for Disease Control and Prevention (CDC). Standard pre-surgical skin prep was conducted. Respiratory hygiene and cough etiquette was practiced. Hand hygiene observed. Safe injection practices and needle disposal techniques followed. SDV (single dose vial) medications used. Medications properly checked for expiration dates and contaminants. Personal protective equipment (PPE) used: Surgical mask. Sterile double glove technique. Radiation resistant gloves. Sterile surgical gloves.  Anesthesia, Analgesia, Anxiolysis: Type: Moderate (Conscious) Sedation & Local Anesthesia. Local Anesthetic: Lidocaine 1% Route: Intravenous (IV) IV Access: Secured. Sedation: Meaningful verbal contact was maintained at all times during the procedure.  Indication(s):Anxiety  Description of Procedure Process:  Time-out: "Time-out" completed before starting procedure, as per protocol. Position: Prone with head of the table was raised to facilitate breathing. Target Area: For Epidural Steroid injections the target is the interlaminar space, initially targeting the lower border of the superior vertebral body lamina. Approach: Paramedial approach. Area Prepped: Entire PosteriorCervical Region Prepping solution: ChloraPrep (2% chlorhexidine gluconate and 70% isopropyl alcohol) Safety Precautions: Aspiration looking for blood return was conducted prior to all injections. At no point did we inject any substances, as a needle was being advanced. No attempts were made at seeking any paresthesias. Safe injection practices and needle  disposal techniques used. Medications properly checked for expiration dates. SDV (single dose vial) medications used. Description of the Procedure: Protocol guidelines were followed. The procedure needle was introduced through the skin, ipsilateral to the reported pain, and advanced to the target area. Bone was contacted and the needle walked caudad, until the lamina was cleared. The epidural space was identified using "loss-of-resistance technique" with 2-3 ml of PF-NaCl (0.9% NSS), in a 5cc LOR glass syringe. EBL: None Materials & Medications Used:  Needle(s) Used: 20g - 10cm, Tuohy-style epidural needle Medications Administered today: We administered ropivacaine (PF) 2 mg/ml (0.2%), iohexol, lidocaine (PF), sodium chloride, fentaNYL, dexamethasone, and midazolam.Please see chart orders for dosing details.  Imaging Guidance:  Type of Imaging Technique: Fluoroscopy Guidance (Spinal) Indication(s): Assistance in needle guidance and placement for procedures requiring needle placement in or near specific anatomical locations not easily accessible without such assistance. Exposure Time: Please see chart for details. Contrast: Before injecting any contrast, we confirmed that the patient did not have an allergy to iodine, shellfish, or radiological contrast. Once satisfactory needle placement was completed at the desired level, radiological contrast was injected. Injection was conducted under continuous fluoroscopic guidance. Injection of contrast accomplished without complications. See chart for type and volume of contrast used. Fluoroscopic Guidance: I was personally present in the fluoroscopy suite, where the patient was placed in position for the procedure, over the fluoroscopy-compatible table. Fluoroscopy was manipulated, using "Tunnel Vision Technique", to obtain the best possible view of the target area, on the affected side. Parallax error was corrected before commencing the procedure. A  "direction-depth-direction" technique was used to introduce the needle under continuous pulsed fluoroscopic guidance. Once the target was reached, antero-posterior, oblique, and lateral fluoroscopic projection views were taken to confirm needle placement in all planes. Permanently recorded images stored by scanning into EMR. Interpretation: Intraoperative imaging interpretation by performing Physician. Adequate needle placement confirmed. Adequate needle placement confirmed in AP, lateral, & Oblique Views. Appropriate spread of contrast to desired area. No evidence of afferent or efferent intravascular uptake. No intrathecal or subarachnoid spread observed.  Permanent hardcopy images in multiple planes scanned into the patient's record.  Antibiotics:  Type:  Antibiotics Given (last 72 hours)    None      Indication(s): No indications identified.  Post-operative Assessment:  Complications: No immediate post-treatment complications were observed. Disposition: Return to clinic for follow-up evaluation. The patient tolerated the entire procedure well. A repeat set of vitals were taken after the procedure and the patient was kept under observation following institutional policy, for this procedure. Post-procedural neurological assessment was performed, showing return to baseline, prior to discharge. The patient was discharged home, once institutional criteria were met. The patient was provided with post-procedure discharge instructions, including a section on how to identify potential problems. Should any problems arise concerning this procedure, the patient was given instructions to immediately contact us, at any time, without hesitation. In any case, we plan to contact the patient by telephone for a follow-up status report regarding this interventional procedure. Comments:  No additional relevant information.  Primary Care Physician: Baltazar Apo, MD Location: [facility]  Facility Note by: Kathlen Brunswick. Dossie Arbour, M.D, DABA, DABAPM, DABPM, DABIPP, FIPP  Disclaimer:  Medicine is not an exact science. The only guarantee in medicine is that nothing is guaranteed. It is important to note that the decision to proceed with this intervention was based on the information collected from the patient. The Data and conclusions were drawn from the patient's questionnaire, the interview, and the physical examination. Because the information was provided in large part by the patient, it cannot be guaranteed that it has not been purposely or unconsciously manipulated. Every effort has been made to obtain as much relevant data as possible for this evaluation. It is important to note that the conclusions that lead to this procedure are derived in large part from the available data. Always take into account that the treatment will also be dependent on availability of resources and existing treatment guidelines, considered by other Pain Management Practitioners as being common knowledge and practice, at the time of the intervention. For Medico-Legal purposes, it is also important to point out that variation in procedural techniques and pharmacological choices are the acceptable norm. The indications, contraindications, technique, and results of the above procedure should only be interpreted and judged by a Board-Certified Interventional Pain Specialist with extensive familiarity and expertise in the same exact procedure and technique. Attempts at providing opinions without similar or greater experience and expertise than that of the treating physician will be considered as inappropriate and unethical, and shall result in a formal complaint to the state medical board and applicable specialty societies.

## 2015-08-28 NOTE — Progress Notes (Signed)
Safety precautions to be maintained throughout the outpatient stay will include: orient to surroundings, keep bed in low position, maintain call bell within reach at all times, provide assistance with transfer out of bed and ambulation.  

## 2015-08-29 ENCOUNTER — Telehealth: Payer: Self-pay | Admitting: *Deleted

## 2015-08-29 NOTE — Telephone Encounter (Signed)
No problems post procedure. 

## 2015-09-18 ENCOUNTER — Ambulatory Visit: Payer: Medicare (Managed Care) | Attending: Pain Medicine | Admitting: Pain Medicine

## 2015-09-18 ENCOUNTER — Encounter: Payer: Self-pay | Admitting: Pain Medicine

## 2015-09-18 VITALS — BP 106/63 | HR 85 | Temp 98.6°F | Resp 18 | Ht 69.0 in | Wt 292.0 lb

## 2015-09-18 DIAGNOSIS — Q762 Congenital spondylolisthesis: Secondary | ICD-10-CM

## 2015-09-18 DIAGNOSIS — M4806 Spinal stenosis, lumbar region: Secondary | ICD-10-CM

## 2015-09-18 DIAGNOSIS — G8929 Other chronic pain: Secondary | ICD-10-CM | POA: Diagnosis not present

## 2015-09-18 DIAGNOSIS — Z87891 Personal history of nicotine dependence: Secondary | ICD-10-CM | POA: Diagnosis not present

## 2015-09-18 DIAGNOSIS — M533 Sacrococcygeal disorders, not elsewhere classified: Secondary | ICD-10-CM | POA: Insufficient documentation

## 2015-09-18 DIAGNOSIS — M48061 Spinal stenosis, lumbar region without neurogenic claudication: Secondary | ICD-10-CM

## 2015-09-18 DIAGNOSIS — Z9889 Other specified postprocedural states: Secondary | ICD-10-CM | POA: Insufficient documentation

## 2015-09-18 DIAGNOSIS — M5412 Radiculopathy, cervical region: Secondary | ICD-10-CM | POA: Diagnosis not present

## 2015-09-18 DIAGNOSIS — M4316 Spondylolisthesis, lumbar region: Secondary | ICD-10-CM | POA: Diagnosis not present

## 2015-09-18 DIAGNOSIS — M549 Dorsalgia, unspecified: Secondary | ICD-10-CM | POA: Diagnosis present

## 2015-09-18 DIAGNOSIS — M5416 Radiculopathy, lumbar region: Secondary | ICD-10-CM | POA: Insufficient documentation

## 2015-09-18 DIAGNOSIS — M79606 Pain in leg, unspecified: Secondary | ICD-10-CM

## 2015-09-18 DIAGNOSIS — M47816 Spondylosis without myelopathy or radiculopathy, lumbar region: Secondary | ICD-10-CM

## 2015-09-18 DIAGNOSIS — N400 Enlarged prostate without lower urinary tract symptoms: Secondary | ICD-10-CM | POA: Diagnosis not present

## 2015-09-18 DIAGNOSIS — E119 Type 2 diabetes mellitus without complications: Secondary | ICD-10-CM | POA: Diagnosis not present

## 2015-09-18 DIAGNOSIS — E785 Hyperlipidemia, unspecified: Secondary | ICD-10-CM | POA: Insufficient documentation

## 2015-09-18 DIAGNOSIS — M542 Cervicalgia: Secondary | ICD-10-CM | POA: Diagnosis present

## 2015-09-18 DIAGNOSIS — M47896 Other spondylosis, lumbar region: Secondary | ICD-10-CM

## 2015-09-18 DIAGNOSIS — F1190 Opioid use, unspecified, uncomplicated: Secondary | ICD-10-CM | POA: Diagnosis not present

## 2015-09-18 DIAGNOSIS — M47812 Spondylosis without myelopathy or radiculopathy, cervical region: Secondary | ICD-10-CM | POA: Diagnosis not present

## 2015-09-18 DIAGNOSIS — M5417 Radiculopathy, lumbosacral region: Secondary | ICD-10-CM | POA: Insufficient documentation

## 2015-09-18 DIAGNOSIS — M4310 Spondylolisthesis, site unspecified: Secondary | ICD-10-CM

## 2015-09-18 DIAGNOSIS — I10 Essential (primary) hypertension: Secondary | ICD-10-CM | POA: Diagnosis not present

## 2015-09-18 DIAGNOSIS — M5126 Other intervertebral disc displacement, lumbar region: Secondary | ICD-10-CM | POA: Diagnosis not present

## 2015-09-18 DIAGNOSIS — G5601 Carpal tunnel syndrome, right upper limb: Secondary | ICD-10-CM | POA: Diagnosis not present

## 2015-09-18 NOTE — Patient Instructions (Signed)

## 2015-09-18 NOTE — Progress Notes (Signed)
Patient's Name: James Friedman MRN: [id number] DOB: [birth year] DOS: 09/18/2015  Primary Reason(s) for Visit: Post-Procedure evaluation CC: Neck Pain and Back Pain   HPI:    Mr. Carrico is a 69 y.o. year old, male patient, who returns today as an established patient. He has Hyperlipidemia; Hypertension; Chest pain; Chronic pain; Pain in shoulder (Bilateral) (L>R); Non-traumatic rotator cuff tear; Chronic pain syndrome; Chronic low back pain (Bilateral); Lumbar spondylosis; Lumbosacral radiculopathy at S1 (Right side); Lumbar facet syndrome (Bilateral); Chronic neck pain; Chronic radicular cervical pain (Bilateral) (R>L); Cervical spondylosis; Morbid obesity (HCC); Long term current use of opiate analgesic; Long term prescription opiate use; Opiate use; Opiate dependence (HCC); Chronic sacroiliac joint pain (R>L); Failed neck surgery syndrome; Chronic upper extremity pain (Bilateral) (R>L); History of bilateral knee surgery; Diffuse myofascial pain syndrome; Chronic right sacroiliac joint pain; Lumbar facet hypertrophy (L4-5 and L5-S1); Carpal tunnel syndrome on right (Released); Transient weakness of left lower extremity; At high risk for falls; Lumbar spinal stenosis (L3-4); 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 His primarily concern today is the Neck Pain and Back Pain     The patient returns to the clinics today for follow-up evaluation after a right-sided C7-T1 cervical epidural steroid injection under fluoroscopic guidance and IV sedation. Although the patient did obtain 100% relief of the pain for the duration of the local anesthetic, suggesting that the location of the injection was correct, he did not obtain any long-term benefit suggesting that the etiology of the pain is noninflammatory and therefore likely to be compressive. The  results of his last Cervical MRI done at [facility] on 10/08/2007 are as follows:  IMPRESSION: Spondylosis of the cervical spine as detailed above. Disc disease causes deformity of the cord most notably from C4-5 to C6-7 as detailed above. Lesser degree of mild ventral cord flattening is seen at C3-4.   For this we have treated him with a series of cervical epidural steroid injections with no long-term benefits.  In addition, the patient indicates that since the last time that he was seen here, he has had approximately 4 different falls from his legs giving away. A recent MRI of the lumbar spine done at [facility] on 07/25/2015 revealed:  IMPRESSION: 1. Advanced disc, endplate, and facet degeneration at L3-L4 with moderate spinal and bilateral L3 foraminal stenosis. Endplate edema at this level felt to reflect acute exacerbation of the chronic endplate degeneration. 2. Chronic L5-S1 disc and endplate degeneration with small right lateral recess caudal disc herniation suspected abutting the descending right S1 nerve root. Query right S1 radiculitis. 3. Grade 1 anterolisthesis at L4-L5 with severe facet degeneration and left foraminal disc herniation. Query left L4 radiculitis.  This has been treated with a series of lumbar epidural steroid injections, again with no long-term benefits and continued recurrent, intermittent lower extremity weakness, numbness and pain.  At this time, it is my professional opinion that he needs to be seen by a neurosurgeon.  Today's Pain Score: 3  Reported level of pain is compatible with clinical observation Pain Type: Chronic pain Pain Location: Neck Pain Orientation: Mid  Date of Last Visit: 08/28/15 Service Provided on Last Visit: Evaluation  Pharmacotherapy Review: The patient's medication management is currently not under our care Side-effects or Adverse reactions: None reported Effectiveness: Described as relatively effective, allowing for increase in activities  of daily living (ADL) Onset of  action: Within expected pharmacological parameters Duration of action: Within normal limits for medication Peak effect: Timing and results are as within normal expected parameters  Lab Work: Inflammation Markers Lab Results  Component Value Date   ESRSEDRATE 7 10/11/2013    Renal Function Lab Results  Component Value Date   BUN 16 10/11/2013   CREATININE 1.13 10/11/2013   GFRAA >60 10/11/2013   GFRNONAA >60 10/11/2013    Hepatic Function Lab Results  Component Value Date   AST 20 10/11/2013   ALT 28 10/11/2013   ALBUMIN 3.8 10/11/2013    Electrolytes Lab Results  Component Value Date   NA 135* 10/11/2013   K 4.6 10/11/2013   CL 98 10/11/2013   CALCIUM 8.5 10/11/2013    Procedure Assessment:   Procedure done on last visit: Right C7-T1 cervical epidural steroid injection under fluoroscopic guidance and IV sedation. Side-effects or Adverse reactions: None reported Sedation: Procedure was performed with sedation  Results: Ultra-Short Term Relief (First 1 hour after procedure): 100 %  Possibly the results is influenced by the pharmacodynamic effect of the local anesthetic, as well as that of the intravenous analgesics and/or sedatives, when used Short Term Relief (Initial 4-6 hrs after procedure): 100 % Short-term relief confirms injected site to be the source of pain Long Term Relief : 10 % No long-term benefit would suggest etiology to be non-inflammatory, possibly compressive   Current Relief (Now):  0%  Recurrance of pain could suggest persistent aggravating factors Interpretation of Results: He continues to be having problems both in the cervical and lumbar region.  Allergies:  [name] has No Known Allergies.  Meds:  The patient has a current medication list which includes the following prescription(s): albuterol, amlodipine, aspirin, atorvastatin, azelastine, capsaicin, cetirizine, chlorpheniramine, vitamin d3, clotrimazole,  duloxetine, gabapentin, hydroxyzine, insulin aspart, insulin detemir, lidocaine, magnesium, metformin, metformin, metoprolol, morphine, multivitamin, naproxen, fish oil, omeprazole, oxycodone hcl, oxygen-helium, testosterone cypionate, trazodone, triamcinolone cream, and vitamin c. Requested Prescriptions    No prescriptions requested or ordered in this encounter    ROS:  Constitutional: Afebrile, no chills, well hydrated and well nourished Gastrointestinal: negative Musculoskeletal:negative Neurological: negative Behavioral/Psych: negative  PFSH:  Medical:  [name]  has a past medical history of Morbid obesity (HCC); Diabetes mellitus without complication (HCC); DDD (degenerative disc disease); Carpal tunnel syndrome; OA (osteoarthritis); OSA (obstructive sleep apnea); Anxiety; BPH (benign prostatic hypertrophy); Ventral hernia; Heme positive stool; Asthma; Hyperlipidemia; Hypertension; History of bilateral knee surgery (07/17/2015); Carpal tunnel syndrome on right (07/17/2015); Wears dentures; and Falls. Family: family history includes Heart attack in his father. Surgical:  has past surgical history that includes Knee surgery (Bilateral); Neck surgery; Carpal tunnel release; Shoulder surgery (Right); and Cataract extraction, extracapsular (Left, 08/04/2015). Tobacco:  reports that he has quit smoking. His smoking use included Cigarettes. He has a 3.75 pack-year smoking history. He does not have any smokeless tobacco history on file. Alcohol:  reports that he does not drink alcohol. Drug:  reports that he does not use illicit drugs.  Physical Exam:  Vitals:  Today's Vitals   09/18/15 0949 09/18/15 0950  BP: 106/63   Pulse: 85   Temp: 98.6 F (37 C)   TempSrc: Oral   Resp: 18   Height: 5\' 9"  (1.753 m)   Weight: 292 lb (132.45 kg)   SpO2: 96%   PainSc:  3   Calculated BMI: Body mass index is 43.1 kg/(m^2). General appearance: alert, cooperative, appears stated age, no distress  and morbidly obese Eyes:  PERLA Respiratory: No evidence respiratory distress, no audible rales or ronchi and no use of accessory muscles of respiration Neck: no adenopathy, no carotid bruit, no JVD, supple, symmetrical, trachea midline and thyroid not enlarged, symmetric, no tenderness/mass/nodules  Cervical Spine ROM: Adequate for flexion, extension, rotation, and lateral bending Palpation: No palpable trigger points  Upper Extremities ROM: Adequate bilaterally Strength: 5/5 for all flexors and extensors of the upper extremity, bilaterally Pulses: Present but difficult to assess due to body habitus. Neurologic: No allodynia, No hyperesthesia, No hyperpathia and No sensory abnormalities  Lumbar Spine ROM: Decreased range of motion of flexion, extension, lateral bending, and rotation, partly due to body habitus. Palpation: No palpable trigger points Lumbar Hyperextension and rotation: Positive bilaterally Patrick's Maneuver: Equivocal  Lower Extremities ROM: Adequate bilaterally Strength: Giveaway weakness, bilaterally. Pulses: Present but difficult to assess due to body habitus. Neurologic: No allodynia, No hyperesthesia, No hyperpathia, No sensory abnormalities and No antalgic gait or posture  Assessment:  Encounter Diagnosis:  Primary Diagnosis: Lumbosacral radiculopathy at S1 [M54.17]  Plan:   Interventional Therapies:  None at this time.  Kabir was seen today for neck pain and back pain.  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) -     Ambulatory referral to Neurosurgery  Chronic pain of lower extremity, unspecified laterality -     Ambulatory referral to Neurosurgery  Grade 1 Anterolisthesis of L4 over L5 -     Ambulatory referral to Neurosurgery  Lumbar  disc herniation (Left L4-5) (Right L5-S1) -     Ambulatory referral to Neurosurgery  Lumbar facet hypertrophy (L4-5 and L5-S1) -     Ambulatory referral to Neurosurgery  Chronic radicular cervical pain (Bilateral) (R>L) -     Ambulatory referral to Neurosurgery     Patient Instructions  Pain Management Discharge Instructions  General Discharge Instructions :  If you need to reach your doctor call: Monday-Friday 8:00 am - 4:00 pm at [phone] or toll free [phone].  After clinic hours [phone] to have operator reach doctor.  Bring all of your medication bottles to all your appointments in the pain clinic.  To cancel or reschedule your appointment with Pain Management please remember to call 24 hours in advance to avoid a fee.  Refer to the educational materials which you have been given on: General Risks, I had my Procedure. Discharge Instructions, Post Sedation.  Post Procedure Instructions:  The drugs you were given will stay in your system until tomorrow, so for the next 24 hours you should not drive, make any legal decisions or drink any alcoholic beverages.  You may eat anything you prefer, but it is better to start with liquids then soups and crackers, and gradually work up to solid foods.  Please notify your doctor immediately if you have any unusual bleeding, trouble breathing or pain that is not related to your normal pain.  Depending on the type of procedure that was done, some parts of your body may feel week and/or numb.  This usually clears up by tonight or the next day.  Walk with the use of an assistive device or accompanied by an adult for the 24 hours.  You may use ice on the affected area for the first 24 hours.  Put ice in a Ziploc bag and cover  with a towel and place against area 15 minutes on 15 minutes off.  You may switch to heat after 24 hours.   Medications discontinued today:  Medications Discontinued During This Encounter  Medication  Reason  . dexamethasone (DECADRON) injection 10 mg Error  . fentaNYL (SUBLIMAZE) injection 100 mcg Error  . fentaNYL (SUBLIMAZE) injection 100 mcg Error  . iohexol (OMNIPAQUE) 180 MG/ML injection 5 mL Error  . lidocaine (PF) (XYLOCAINE) 1 % injection 10 mL Error  . lidocaine (PF) (XYLOCAINE) 1 % injection 10 mL Error  . midazolam (VERSED) 5 MG/5ML injection 5 mg Error  . midazolam (VERSED) 5 MG/5ML injection 5 mg Error  . ropivacaine (PF) 2 mg/ml (0.2%) (NAROPIN) epidural 1 mL Error  . ropivacaine (PF) 2 mg/ml (0.2%) (NAROPIN) epidural 9 mL Error  . sodium chloride 0.9 % injection 1 mL Error  . triamcinolone acetonide (KENALOG-40) injection 40 mg Error   Medications administered today:  Mr. Lewallen had no medications administered during this visit.  Primary Care Physician: Maryruth Hancock, MD Location: [facility] Note by: Sydnee Levans. Laban Emperor, M.D, DABA, DABAPM, DABPM, DABIPP, FIPP

## 2015-09-18 NOTE — Progress Notes (Signed)
Safety precautions to be maintained throughout the outpatient stay will include: orient to surroundings, keep bed in low position, maintain call bell within reach at all times, provide assistance with transfer out of bed and ambulation.  Pt has fallen 4 times since last visit- (see nursing assessment)

## 2015-09-25 ENCOUNTER — Ambulatory Visit (INDEPENDENT_AMBULATORY_CARE_PROVIDER_SITE_OTHER): Payer: Medicare (Managed Care) | Admitting: Nurse Practitioner

## 2015-09-25 ENCOUNTER — Encounter: Payer: Self-pay | Admitting: Nurse Practitioner

## 2015-09-25 VITALS — BP 100/64 | HR 76 | Ht 69.0 in | Wt 295.8 lb

## 2015-09-25 DIAGNOSIS — G8929 Other chronic pain: Secondary | ICD-10-CM

## 2015-09-25 DIAGNOSIS — E119 Type 2 diabetes mellitus without complications: Secondary | ICD-10-CM | POA: Diagnosis not present

## 2015-09-25 DIAGNOSIS — I10 Essential (primary) hypertension: Secondary | ICD-10-CM | POA: Diagnosis not present

## 2015-09-25 DIAGNOSIS — R0789 Other chest pain: Secondary | ICD-10-CM | POA: Insufficient documentation

## 2015-09-25 NOTE — Progress Notes (Signed)
Office Visit    Patient Name: James Friedman Date of Encounter: 09/25/2015  Primary Care Provider:  Pcp Not In System Primary Cardiologist:  M. Kirke CorinArida, MD   Chief Complaint    70 year old male with history of atypical chest pain, chronic pain, hypertension, hyperlipidemia, diabetes, and obesity who presents for semiannual follow-up.  Past Medical History    Past Medical History  Diagnosis Date  . Morbid obesity (HCC)   . Diabetes mellitus without complication (HCC)   . DDD (degenerative disc disease)   . Carpal tunnel syndrome   . OA (osteoarthritis)     knees  . OSA (obstructive sleep apnea)   . Anxiety   . BPH (benign prostatic hypertrophy)   . Ventral hernia   . Heme positive stool   . Asthma   . Hyperlipidemia   . Essential hypertension   . History of bilateral knee surgery 07/17/2015  . Carpal tunnel syndrome on right 07/17/2015  . Wears dentures     full upper and lower  . Falls   . Atypical chest pain     a. 2014 Neg MV.   Past Surgical History  Procedure Laterality Date  . Knee surgery Bilateral   . Neck surgery    . Carpal tunnel release    . Shoulder surgery Right   . Cataract extraction extracapsular Left 08/04/2015    Procedure: CATARACT EXTRACTION EXTRACAPSULAR WITH INTRAOCULAR LENS PLACEMENT (IOC);  Surgeon: Sherald HessAnita Prakash Vin-Parikh, MD;  [facility];  Service: Ophthalmology;  Laterality: Left;  DIABETIC - insulin and oral meds    Allergies  No Known Allergies  History of Present Illness    70 year old male with the above complex past medical history. He has chronic pain and also history of atypical chest pain with negative stress test in 2014. He was last seen in March 2015 and states that since that time, he has done reasonably well. He tries to do some amount of exercise, usually from a seated position, most visible. Activity is significantly limited by back pain and he is followed closely in pain clinic. He says that he is  soon to be referred to neurosurgery. From a chest pain standpoint, he notes an occasional episode of sharp fleeting left-sided chest pain lasting just a few seconds and resolving spontaneously. This is similar to the atypical chest pain he has expressed in the past. He denies dyspnea on exertion, PND, orthopnea, dizziness, syncope, edema, or early satiety.  Home Medications    Prior to Admission medications   Medication Sig Start Date End Date Taking? Authorizing Provider  albuterol (PROAIR HFA) 108 (90 BASE) MCG/ACT inhaler Inhale 2 puffs into the lungs every 6 (six) hours as needed for wheezing.   Yes Historical Provider, MD  amLODipine (NORVASC) 5 MG tablet Take 5 mg by mouth daily.  07/12/14  Yes Historical Provider, MD  aspirin 81 MG tablet Take 81 mg by mouth daily.   Yes Historical Provider, MD  atorvastatin (LIPITOR) 80 MG tablet Take 80 mg by mouth daily.   Yes Historical Provider, MD  azelastine (ASTELIN) 0.1 % nasal spray Place 1 spray into both nostrils 2 (two) times daily. Use in each nostril as directed   Yes Historical Provider, MD  capsaicin (ZOSTRIX) 0.025 % cream Apply 1 application topically 2 (two) times daily.   Yes Historical Provider, MD  cetirizine (ZYRTEC) 10 MG tablet Take 10 mg by mouth daily.   Yes Historical Provider, MD  chlorpheniramine (CHLOR-TRIMETON) 4 MG  tablet Take 4 mg by mouth every 6 (six) hours as needed for allergies.   Yes Historical Provider, MD  Cholecalciferol (VITAMIN D3) 5000 UNITS TABS Take 1 tablet by mouth daily.   Yes Historical Provider, MD  Clotrimazole 1 % OINT Apply 1 application topically daily.   Yes Historical Provider, MD  DULoxetine (CYMBALTA) 30 MG capsule Take 30 mg by mouth 2 (two) times daily.   Yes Historical Provider, MD  gabapentin (NEURONTIN) 600 MG tablet Take 600 mg by mouth 3 (three) times daily.   Yes Historical Provider, MD  hydrOXYzine (ATARAX/VISTARIL) 25 MG tablet Take 25 mg by mouth every 6 (six) hours as needed for  itching.   Yes Historical Provider, MD  insulin aspart (NOVOLOG) 100 UNIT/ML injection Inject 30-58 Units into the skin 3 (three) times daily before meals.   Yes Historical Provider, MD  insulin detemir (LEVEMIR) 100 UNIT/ML injection Inject 70 Units into the skin 2 (two) times daily.   Yes Historical Provider, MD  lidocaine (LIDODERM) 5 % Place 1 patch onto the skin daily. Remove & Discard patch within 12 hours or as directed by MD   Yes Historical Provider, MD  Magnesium 400 MG TABS Take 1 tablet by mouth daily.   Yes Historical Provider, MD  metFORMIN (GLUCOPHAGE) 1000 MG tablet Take 1,000 mg by mouth 2 (two) times daily with a meal.   Yes Historical Provider, MD  metoprolol (LOPRESSOR) 100 MG tablet Take 100 mg by mouth 2 (two) times daily.   Yes Historical Provider, MD  morphine (MS CONTIN) 15 MG 12 hr tablet Take 15 mg by mouth every 8 (eight) hours.  07/31/14  Yes Historical Provider, MD  Multiple Vitamin (MULTIVITAMIN) tablet Take 1 tablet by mouth daily.   Yes Historical Provider, MD  naproxen (NAPROSYN) 500 MG tablet Take 500 mg by mouth 2 (two) times daily with a meal.   Yes Historical Provider, MD  Omega-3 Fatty Acids (FISH OIL) 1000 MG CAPS Take by mouth 2 (two) times daily.   Yes Historical Provider, MD  omeprazole (PRILOSEC) 20 MG capsule Take 20 mg by mouth daily.   Yes Historical Provider, MD  Oxycodone HCl 10 MG TABS Take 10 mg by mouth every 6 (six) hours as needed.  07/30/14  Yes Historical Provider, MD  OXYGEN Inhale 3 L into the lungs at bedtime.   Yes Historical Provider, MD  testosterone cypionate (DEPOTESTOTERONE CYPIONATE) 200 MG/ML injection Inject 200 mg into the muscle every 14 (fourteen) days.   Yes Historical Provider, MD  traZODone (DESYREL) 150 MG tablet Take 150 mg by mouth at bedtime.   Yes Historical Provider, MD  triamcinolone cream (KENALOG) 0.5 % Apply topically 2 (two) times daily.   Yes Historical Provider, MD  vitamin C (ASCORBIC ACID) 500 MG tablet Take 500  mg by mouth daily.   Yes Historical Provider, MD    Review of Systems    As above, he has chronic multifocal pain including sharp and fleeting atypical chest pain on a rare occasion.  He denies dyspnea exertion, palpitations, PND, orthopnea, dizziness, syncope, edema, early satiety.  All other systems reviewed and are otherwise negative except as noted above.  Physical Exam    VS:  BP 100/64 mmHg  Pulse 76  Ht 5\' 9"  (1.753 m)  Wt 295 lb 12 oz (134.151 kg)  BMI 43.65 kg/m2 , BMI Body mass index is 43.65 kg/(m^2). GEN: Obese, well developed, in no acute distress. HEENT: normal. Neck: Supple, no carotid bruits, or  masses. difficult to assess JVP secondary to girth.  Cardiac: RRR, no murmurs, rubs, or gallops. No clubbing, cyanosis, edema.  Radials/DP/PT 2+ and equal bilaterally.  Respiratory:  Respirations regular and unlabored, clear to auscultation bilaterally. GI: Soft,  obese, nontender, nondistended, BS + x 4. MS: no deformity or atrophy. Skin: warm and dry, no rash. Neuro:  Strength and sensation are intact. Psych: Normal affect.  Accessory Clinical Findings    ECG - Regular sinus rhythm, 76, no acute ST or T changes.  Assessment & Plan    1.  Atypical chest pain: Patient has a history of chronic pain and in that setting, has had atypical chest pain. This occurs rarely and described as sharp and fleeting in nature without associated symptoms. He had a negative Myoview in 2014. He does not require any additional testing at this time.  2. Essential hypertension: Blood pressure stable on calcium channel blocker and beta blocker therapy.   3.  Diabetes mellitus: This is followed closely by primary care.  4. Morbid obesity: Patient says he is exercising but activity is fairly limited by chronic pain.  5. Chronic pain: Followed closely in pain clinic.  6. Disposition: Overall stable from a cardiac standpoint.  Follow-up in one year or sooner if necessary.   Nicolasa Ducking,  NP 09/25/2015, 9:19 AM

## 2015-09-25 NOTE — Patient Instructions (Signed)
Medication Instructions:  Your physician recommends that you continue on your current medications as directed. Please refer to the Current Medication list given to you today.   Labwork: none  Testing/Procedures: none  Follow-Up: Your physician wants you to follow-up in: one year with Dr. Arida.  You will receive a reminder letter in the mail two months in advance. If you don't receive a letter, please call our office to schedule the follow-up appointment.   Any Other Special Instructions Will Be Listed Below (If Applicable).     If you need a refill on your cardiac medications before your next appointment, please call your pharmacy.   

## 2015-10-02 ENCOUNTER — Encounter: Payer: Self-pay | Admitting: Pain Medicine

## 2015-10-02 ENCOUNTER — Ambulatory Visit: Payer: Medicare (Managed Care) | Attending: Pain Medicine | Admitting: Pain Medicine

## 2015-10-02 VITALS — BP 122/68 | HR 81 | Temp 98.2°F | Resp 16 | Ht 69.0 in | Wt 292.0 lb

## 2015-10-02 DIAGNOSIS — F1190 Opioid use, unspecified, uncomplicated: Secondary | ICD-10-CM | POA: Insufficient documentation

## 2015-10-02 DIAGNOSIS — G8929 Other chronic pain: Secondary | ICD-10-CM | POA: Diagnosis not present

## 2015-10-02 DIAGNOSIS — M545 Low back pain: Secondary | ICD-10-CM | POA: Diagnosis not present

## 2015-10-02 DIAGNOSIS — Z79891 Long term (current) use of opiate analgesic: Secondary | ICD-10-CM

## 2015-10-02 DIAGNOSIS — M4316 Spondylolisthesis, lumbar region: Secondary | ICD-10-CM | POA: Insufficient documentation

## 2015-10-02 DIAGNOSIS — M542 Cervicalgia: Secondary | ICD-10-CM | POA: Insufficient documentation

## 2015-10-02 DIAGNOSIS — M5126 Other intervertebral disc displacement, lumbar region: Secondary | ICD-10-CM | POA: Insufficient documentation

## 2015-10-02 DIAGNOSIS — E785 Hyperlipidemia, unspecified: Secondary | ICD-10-CM | POA: Diagnosis not present

## 2015-10-02 DIAGNOSIS — M4806 Spinal stenosis, lumbar region: Secondary | ICD-10-CM | POA: Insufficient documentation

## 2015-10-02 DIAGNOSIS — M47896 Other spondylosis, lumbar region: Secondary | ICD-10-CM | POA: Diagnosis not present

## 2015-10-02 DIAGNOSIS — M79606 Pain in leg, unspecified: Secondary | ICD-10-CM | POA: Diagnosis present

## 2015-10-02 DIAGNOSIS — M533 Sacrococcygeal disorders, not elsewhere classified: Secondary | ICD-10-CM | POA: Diagnosis not present

## 2015-10-02 DIAGNOSIS — M4726 Other spondylosis with radiculopathy, lumbar region: Secondary | ICD-10-CM

## 2015-10-02 DIAGNOSIS — M47816 Spondylosis without myelopathy or radiculopathy, lumbar region: Secondary | ICD-10-CM | POA: Diagnosis not present

## 2015-10-02 DIAGNOSIS — I10 Essential (primary) hypertension: Secondary | ICD-10-CM | POA: Diagnosis not present

## 2015-10-02 DIAGNOSIS — M549 Dorsalgia, unspecified: Secondary | ICD-10-CM | POA: Diagnosis present

## 2015-10-02 MED ORDER — LIDOCAINE HCL (PF) 1 % IJ SOLN: 10.0000 mL | Freq: Once | INTRAMUSCULAR | Status: DC

## 2015-10-02 MED ORDER — LACTATED RINGERS IV SOLN: 1000.0000 mL | INTRAVENOUS | Status: AC

## 2015-10-02 MED ORDER — ROPIVACAINE HCL 2 MG/ML IJ SOLN
9.0000 mL | Freq: Once | INTRAMUSCULAR | Status: AC
  Administered 2015-10-02: 9 mL

## 2015-10-02 MED ORDER — ROPIVACAINE HCL 2 MG/ML IJ SOLN
INTRAMUSCULAR | Status: AC
  Administered 2015-10-02: 9 mL
  Filled 2015-10-02: qty 10

## 2015-10-02 MED ORDER — MIDAZOLAM HCL 5 MG/5ML IJ SOLN
INTRAMUSCULAR | Status: AC
  Administered 2015-10-02: 2 mg via INTRAVENOUS
  Filled 2015-10-02: qty 5

## 2015-10-02 MED ORDER — FENTANYL CITRATE (PF) 100 MCG/2ML IJ SOLN
100.0000 ug | Freq: Once | INTRAMUSCULAR | Status: AC
  Administered 2015-10-02: 100 ug via INTRAVENOUS

## 2015-10-02 MED ORDER — MIDAZOLAM HCL 5 MG/5ML IJ SOLN
5.0000 mg | Freq: Once | INTRAMUSCULAR | Status: AC
  Administered 2015-10-02: 2 mg via INTRAVENOUS

## 2015-10-02 MED ORDER — TRIAMCINOLONE ACETONIDE 40 MG/ML IJ SUSP
INTRAMUSCULAR | Status: AC
  Administered 2015-10-02: 40 mg
  Filled 2015-10-02: qty 1

## 2015-10-02 MED ORDER — FENTANYL CITRATE (PF) 100 MCG/2ML IJ SOLN
INTRAMUSCULAR | Status: AC
  Administered 2015-10-02: 100 ug via INTRAVENOUS
  Filled 2015-10-02: qty 2

## 2015-10-02 MED ORDER — TRIAMCINOLONE ACETONIDE 40 MG/ML IJ SUSP
40.0000 mg | Freq: Once | INTRAMUSCULAR | Status: AC
  Administered 2015-10-02: 40 mg

## 2015-10-02 NOTE — Progress Notes (Signed)
Safety precautions to be maintained throughout the outpatient stay will include: orient to surroundings, keep bed in low position, maintain call bell within reach at all times, provide assistance with transfer out of bed and ambulation.  

## 2015-10-02 NOTE — Patient Instructions (Signed)
Radiofrequency Lesioning Radiofrequency lesioning is a procedure that is performed to relieve pain. The procedure is often used for back, neck, or arm pain. Radiofrequency lesioning involves the use of a machine that creates radio waves to make heat. During the procedure, the heat is applied to the nerve that carries the pain signal. The heat damages the nerve and interferes with the pain signal. Pain relief usually lasts for 6 months to 1 year. LET YOUR HEALTH CARE PROVIDER KNOW ABOUT:  Any allergies you have.  All medicines you are taking, including vitamins, herbs, eye drops, creams, and over-the-counter medicines.  Previous problems you or members of your family have had with the use of anesthetics.  Any blood disorders you have.  Previous surgeries you have had.  Any medical conditions you have.  Whether you are pregnant or may be pregnant. RISKS AND COMPLICATIONS Generally, this is a safe procedure. However, problems may occur, including:  Pain or soreness at the injection site.  Infection at the injection site.  Damage to nerves or blood vessels. BEFORE THE PROCEDURE  Ask your health care provider about:  Changing or stopping your regular medicines. This is especially important if you are taking diabetes medicines or blood thinners.  Taking medicines such as aspirin and ibuprofen. These medicines can thin your blood. Do not take these medicines before your procedure if your health care provider instructs you not to.  Follow instructions from your health care provider about eating or drinking restrictions.  Plan to have someone take you home after the procedure.  If you go home right after the procedure, plan to have someone with you for 24 hours. PROCEDURE  You will be given one or more of the following:  A medicine to help you relax (sedative).  A medicine to numb the area (local anesthetic).  You will be awake during the procedure. You will need to be able to  talk with the health care provider during the procedure.  With the help of a type of X-ray (fluoroscopy), the health care provider will insert a radiofrequency needle into the area to be treated.  Next, a wire that carries the radio waves (electrode) will be put through the radiofrequency needle. An electrical pulse will be sent through the electrode to verify the correct nerve. You will feel a tingling sensation, and you may have muscle twitching.  Then, the tissue that is around the needle tip will be heated by an electric current that is passed using the radiofrequency machine. This will numb the nerves.  A bandage (dressing) will be put on the insertion area after the procedure is done. The procedure may vary among health care providers and hospitals. AFTER THE PROCEDURE  Your blood pressure, heart rate, breathing rate, and blood oxygen level will be monitored often until the medicines you were given have worn off.  Return to your normal activities as directed by your health care provider.   This information is not intended to replace advice given to you by your health care provider. Make sure you discuss any questions you have with your health care provider.   Document Released: 05/05/2011 Document Revised: 05/28/2015 Document Reviewed: 10/14/2014 Elsevier Interactive Patient Education 2016 [employer] Pain Management Discharge Instructions  General Discharge Instructions :  If you need to reach your doctor call: Monday-Friday 8:00 am - 4:00 pm at [phone] or toll free [phone].  After clinic hours [phone] to have operator reach doctor.  Bring all of your medication bottles to all your   appointments in the pain clinic.  To cancel or reschedule your appointment with Pain Management please remember to call 24 hours in advance to avoid a fee.  Refer to the educational materials which you have been given on: General Risks, I had my Procedure. Discharge Instructions,  Post Sedation.  Post Procedure Instructions:  The drugs you were given will stay in your system until tomorrow, so for the next 24 hours you should not drive, make any legal decisions or drink any alcoholic beverages.  You may eat anything you prefer, but it is better to start with liquids then soups and crackers, and gradually work up to solid foods.  Please notify your doctor immediately if you have any unusual bleeding, trouble breathing or pain that is not related to your normal pain.  Depending on the type of procedure that was done, some parts of your body may feel week and/or numb.  This usually clears up by tonight or the next day.  Walk with the use of an assistive device or accompanied by an adult for the 24 hours.  You may use ice on the affected area for the first 24 hours.  Put ice in a Ziploc bag and cover with a towel and place against area 15 minutes on 15 minutes off.  You may switch to heat after 24 hours. 

## 2015-10-02 NOTE — Progress Notes (Signed)
Patient's Name: James Friedman MRN: [id number] DOB: 1945-09-25 DOS: 10/02/2015  Primary Reason(s) for Visit: Interventional Pain Management Treatment. CC: Back Pain and Leg Pain   Pre-Procedure Assessment:  James Friedman is a 70 y.o. year old, male patient, seen today for interventional treatment. He has Hyperlipidemia; Hypertension; Chest pain; Chronic pain; Pain in shoulder (Bilateral) (L>R); Non-traumatic rotator cuff tear; Chronic pain syndrome; Chronic low back pain (Bilateral); Lumbar spondylosis; Lumbosacral radiculopathy at S1 (Right side); Lumbar facet syndrome (Bilateral); Chronic neck pain; Chronic radicular cervical pain (Bilateral) (R>L); Cervical spondylosis; Morbid obesity ([place]); Long term current use of opiate analgesic; Long term prescription opiate use; Opiate use; Opiate dependence ([place]); Chronic sacroiliac joint pain (R>L); Failed neck surgery syndrome; Chronic upper extremity pain (Bilateral) (R>L); History of bilateral knee surgery; Diffuse myofascial pain syndrome; Chronic right sacroiliac joint pain; Lumbar facet hypertrophy (L4-5 and L5-S1); Carpal tunnel syndrome on right (Released); Transient weakness of left lower extremity; At high risk for falls; Lumbar spinal stenosis (L3-4); 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 His primarily concern today is the Back Pain and Leg Pain   Today's Pain Score: 7 , clinically he looks like a 4-5/10. Reported level of pain is incompatible with clinical obrservations. This may be secondary to a possible lack of understanding on how the pain scale works. Pain Type: Chronic pain Pain Descriptors / Indicators: Constant  Date of Last Visit: 09/25/15 Service Provided on Last Visit:  Evaluation  Verification of the correct person, correct site (including marking of site), and correct procedure were performed and confirmed by the patient.  Today's Vitals   10/02/15 0930 10/02/15 0940 10/02/15 0950 10/02/15 0955  BP: 138/86 130/58 136/59 122/68  Pulse: 79 78 79 81  Temp:      TempSrc:      Resp: _0 Height:      Weight:      SpO2: 94% 92% 95% 95%  PainSc: _1 Calculated BMI: Body mass index is 43.1 kg/(m^2). Allergies: He has No Known Allergies.. Primary Diagnosis: Facet syndrome, lumbar [M54.5]  Procedure:  Type: Therapeutic Medial Branch Facet Radiofrequency Ablation Region: Lumbar Level: L2, L3, L4, L5, & S1 Medial Branch Level(s) Laterality: Right-Sided  Indications: Spondylosis, Lumbosacral Region Lumbosacral Spine Pain  Consent: Secured. Under the influence of no sedatives a written informed consent was obtained, after having provided information on the risks and possible complications. To fulfill our ethical and legal obligations, as recommended by the American Medical Association's Code of Ethics, we have provided information to the patient about our clinical impression; the nature and purpose of the treatment or procedure; the risks, benefits, and possible complications of the intervention; alternatives; the risk(s) and benefit(s) of the alternative treatment(s) or procedure(s); and the risk(s) and benefit(s) of doing nothing. The patient was provided information about the risks and possible complications associated with the procedure. In the case of spinal procedures these may include, but are not limited to, failure to achieve desired goals, infection, bleeding, organ or nerve damage, allergic reactions, paralysis, and death. In addition, the patient was informed that Medicine is not an exact science; therefore, there is also the possibility of unforeseen risks and possible complications that may result in a catastrophic outcome. The  patient indicated having understood very  clearly. We have given the patient no guarantees and we have made no promises. Enough time was given to the patient to ask questions, all of which were answered to the patient's satisfaction.  Pre-Procedure Preparation: Safety Precautions: Allergies reviewed. Appropriate site, procedure, and patient were confirmed by following the Joint Commission's Universal Protocol (UP.01.01.01), in the form of a "Time Out". The patient was asked to confirm marked site and procedure, before commencing. The patient was asked about blood thinners, or active infections, both of which were denied. Patient was assessed for positional comfort and all pressure points were checked before starting procedure. Monitoring:  As per clinic protocol. Infection Control Precautions: Sterile technique used. Standard Universal Precautions were taken as recommended by the Department of [facility] for Disease Control and Prevention (CDC). Standard pre-surgical skin prep was conducted. Respiratory hygiene and cough etiquette was practiced. Hand hygiene observed. Safe injection practices and needle disposal techniques followed. SDV (single dose vial) medications used. Medications properly checked for expiration dates and contaminants. Personal protective equipment (PPE) used: Sterile double glove technique. Radiation resistant gloves. Sterile surgical gloves.  Anesthesia, Analgesia, Anxiolysis: Type: Moderate (Conscious) Sedation & Local Anesthesia. Meaningful verbal contact was maintained, with the patient at all times during the procedure. Local Anesthetic(s): Lidocaine 1% Route: Intravenous (IV) IV Access: Secured Sedation: Meaningful verbal contact was maintained at all times during the procedure. Indication(s):Analgesia.  Description of Procedure Process:  Time-out: "Time-out" completed before starting procedure, as per protocol. Position: Prone Target Area: For Lumbar Facet  blocks, the target is the groove formed by the junction of the transverse process and superior articular process. For the L5 dorsal ramus, the target is the notch between superior articular process and sacral ala. For the S1 dorsal ramus, the target is the superior and lateral edge of the posterior S1 Sacral foramen. Approach: Paraspinal approach. Area Prepped: Entire Posterior Lumbosacral Region Prepping solution: ChloraPrep (2% chlorhexidine gluconate and 70% isopropyl alcohol) Safety Precautions: Aspiration looking for blood return was conducted prior to all injections. At no point did we inject any substances, as a needle was being advanced. No attempts were made at seeking any paresthesias. Safe injection practices and needle disposal techniques used. Medications properly checked for expiration dates. SDV (single dose vial) medications used. Description of the Procedure: Protocol guidelines were followed. The patient was placed in position over the fluoroscopy table. The target area was identified and the area prepped in the usual manner. Skin desensitized using vapocoolant spray. Skin & deeper tissues infiltrated with local anesthetic. Appropriate amount of time allowed to pass for local anesthetics to take effect. Radiofrequency needles were introduced to the area of the medial branch at the junction of the superior articular process and transverse process using fluoroscopy. Using the [employer], sensory stimulation using 50 Hz was used to locate & identify the nerve, making sure that the needle was positioned such that there was no sensory stimulation below 0.3 V or above 0.7 V. Stimulation using 2 Hz was used to evaluate the motor component. Care was taken not to lesion any nerves that demonstrated motor stimulation of the lower extremities at an output of less than 2.5 times that of the sensory threshold, or a maximum of 2.0 V. Once satisfactory placement of the needles was  achieved, the above solution was slowly injected after negative aspiration. After waiting for at least 2 minutes, the ablation was performed at 80 degrees C for 60 seconds.The needles were then removed and the area cleansed, making sure  to leave some of the prepping solution back to take advantage of its long term bactericidal properties. EBL: None Materials & Medications Used:  Needle(s) Used: Teflon-Coated Radiofrequency Needles Medications Administered today: We administered fentaNYL, midazolam, ropivacaine (PF) 2 mg/ml (0.2%), triamcinolone acetonide, ropivacaine (PF) 2 mg/ml (0.2%), fentaNYL, triamcinolone acetonide, and midazolam.Please see chart orders for dosing details.  Imaging Guidance:  Type of Imaging Technique: Fluoroscopy Guidance (Spinal) Indication(s): Assistance in needle guidance and placement for procedures requiring needle placement in or near specific anatomical locations not easily accessible without such assistance. Exposure Time: Please see nurses notes. Contrast: None required. Fluoroscopic Guidance: I was personally present in the fluoroscopy suite, where the patient was placed in position for the procedure, over the fluoroscopy-compatible table. Fluoroscopy was manipulated, using "Tunnel Vision Technique", to obtain the best possible view of the target area, on the affected side. Parallax error was corrected before commencing the procedure. A "direction-depth-direction" technique was used to introduce the needle under continuous pulsed fluoroscopic guidance. Once the target was reached, antero-posterior, oblique, and lateral fluoroscopic projection views were taken to confirm needle placement in all planes. Permanently recorded images stored by scanning into EMR. Interpretation: Intraoperative imaging interpretation by performing Physician. Adequate needle placement confirmed.  Antibiotics:  Type:  Antibiotics Given (last 72 hours)    None      Indication(s): No  indications identified.  Post-operative Assessment:  Complications: No immediate post-treatment complications were observed. Post-Procedure Pain Score (NAS11): 7/10 Relevant Post-operative Information:  Disposition: Return to clinic for follow-up evaluation. The patient tolerated the entire procedure well. A repeat set of vitals were taken after the procedure and the patient was kept under observation following institutional policy, for this procedure. Post-procedural neurological assessment was performed, showing return to baseline, prior to discharge. The patient was discharged home, once institutional criteria were met. The patient was provided with post-procedure discharge instructions, including a section on how to identify potential problems. Should any problems arise concerning this procedure, the patient was given instructions to immediately contact us, at any time, without hesitation. In any case, we plan to contact the patient by telephone for a follow-up status report regarding this interventional procedure. Comments:  No significant relief observed from procedure. Furthermore, identifying the nerve roots and median nerve was difficult and I believe this might obtain secondary to the fact that he may still be getting benefit from the prior radiofrequency. I will see him back in 6 weeks and if he still having problems, this clearly means that is coming from elsewhere.  Primary Care Physician: Pcp Not In System Location: [facility] Note by: Francisco A. Dossie Arbour, M.D, DABA, DABAPM, DABPM, DABIPP, FIPP  Disclaimer:  Medicine is not an exact science. The only guarantee in medicine is that nothing is guaranteed. It is important to note that the decision to proceed with this intervention was based on the information collected from the patient. The Data and conclusions were drawn from the patient's questionnaire, the interview, and the physical examination. Because the  information was provided in large part by the patient, it cannot be guaranteed that it has not been purposely or unconsciously manipulated. Every effort has been made to obtain as much relevant data as possible for this evaluation. It is important to note that the conclusions that lead to this procedure are derived in large part from the available data. Always take into account that the treatment will also be dependent on availability of resources and existing treatment guidelines, considered by other Pain  Management Practitioners as being common knowledge and practice, at the time of the intervention. For Medico-Legal purposes, it is also important to point out that variation in procedural techniques and pharmacological choices are the acceptable norm. The indications, contraindications, technique, and results of the above procedure should only be interpreted and judged by a Board-Certified Interventional Pain Specialist with extensive familiarity and expertise in the same exact procedure and technique. Attempts at providing opinions without similar or greater experience and expertise than that of the treating physician will be considered as inappropriate and unethical, and shall result in a formal complaint to the state medical board and applicable specialty societies.

## 2015-10-03 ENCOUNTER — Telehealth: Payer: Self-pay

## 2015-10-03 NOTE — Telephone Encounter (Signed)
Procedure call back....  Patient states he is doing good.

## 2015-11-12 ENCOUNTER — Encounter: Payer: Self-pay | Admitting: Pain Medicine

## 2015-11-12 ENCOUNTER — Ambulatory Visit: Payer: Medicare (Managed Care) | Attending: Pain Medicine | Admitting: Pain Medicine

## 2015-11-12 VITALS — BP 115/76 | HR 91 | Temp 98.4°F | Resp 16 | Ht 69.0 in | Wt 292.0 lb

## 2015-11-12 DIAGNOSIS — M47816 Spondylosis without myelopathy or radiculopathy, lumbar region: Secondary | ICD-10-CM | POA: Diagnosis not present

## 2015-11-12 DIAGNOSIS — M4726 Other spondylosis with radiculopathy, lumbar region: Secondary | ICD-10-CM | POA: Diagnosis not present

## 2015-11-12 DIAGNOSIS — E1140 Type 2 diabetes mellitus with diabetic neuropathy, unspecified: Secondary | ICD-10-CM | POA: Insufficient documentation

## 2015-11-12 DIAGNOSIS — K439 Ventral hernia without obstruction or gangrene: Secondary | ICD-10-CM | POA: Diagnosis not present

## 2015-11-12 DIAGNOSIS — M4316 Spondylolisthesis, lumbar region: Secondary | ICD-10-CM | POA: Insufficient documentation

## 2015-11-12 DIAGNOSIS — M5417 Radiculopathy, lumbosacral region: Secondary | ICD-10-CM | POA: Insufficient documentation

## 2015-11-12 DIAGNOSIS — G4733 Obstructive sleep apnea (adult) (pediatric): Secondary | ICD-10-CM | POA: Insufficient documentation

## 2015-11-12 DIAGNOSIS — M79606 Pain in leg, unspecified: Secondary | ICD-10-CM

## 2015-11-12 DIAGNOSIS — Z87891 Personal history of nicotine dependence: Secondary | ICD-10-CM | POA: Insufficient documentation

## 2015-11-12 DIAGNOSIS — I10 Essential (primary) hypertension: Secondary | ICD-10-CM | POA: Diagnosis not present

## 2015-11-12 DIAGNOSIS — N400 Enlarged prostate without lower urinary tract symptoms: Secondary | ICD-10-CM | POA: Insufficient documentation

## 2015-11-12 DIAGNOSIS — M545 Low back pain: Secondary | ICD-10-CM

## 2015-11-12 DIAGNOSIS — M25562 Pain in left knee: Secondary | ICD-10-CM | POA: Diagnosis not present

## 2015-11-12 DIAGNOSIS — M25561 Pain in right knee: Secondary | ICD-10-CM | POA: Diagnosis not present

## 2015-11-12 DIAGNOSIS — E785 Hyperlipidemia, unspecified: Secondary | ICD-10-CM | POA: Insufficient documentation

## 2015-11-12 DIAGNOSIS — M542 Cervicalgia: Secondary | ICD-10-CM | POA: Insufficient documentation

## 2015-11-12 DIAGNOSIS — M25511 Pain in right shoulder: Secondary | ICD-10-CM | POA: Diagnosis not present

## 2015-11-12 DIAGNOSIS — G5601 Carpal tunnel syndrome, right upper limb: Secondary | ICD-10-CM | POA: Diagnosis not present

## 2015-11-12 DIAGNOSIS — Z9889 Other specified postprocedural states: Secondary | ICD-10-CM | POA: Diagnosis not present

## 2015-11-12 DIAGNOSIS — G8929 Other chronic pain: Secondary | ICD-10-CM | POA: Diagnosis not present

## 2015-11-12 DIAGNOSIS — M25569 Pain in unspecified knee: Secondary | ICD-10-CM | POA: Diagnosis present

## 2015-11-12 DIAGNOSIS — E1142 Type 2 diabetes mellitus with diabetic polyneuropathy: Secondary | ICD-10-CM

## 2015-11-12 DIAGNOSIS — M5126 Other intervertebral disc displacement, lumbar region: Secondary | ICD-10-CM | POA: Diagnosis not present

## 2015-11-12 DIAGNOSIS — M4806 Spinal stenosis, lumbar region: Secondary | ICD-10-CM | POA: Insufficient documentation

## 2015-11-12 DIAGNOSIS — M5416 Radiculopathy, lumbar region: Secondary | ICD-10-CM

## 2015-11-12 DIAGNOSIS — M549 Dorsalgia, unspecified: Secondary | ICD-10-CM | POA: Diagnosis present

## 2015-11-12 DIAGNOSIS — M25519 Pain in unspecified shoulder: Secondary | ICD-10-CM | POA: Diagnosis present

## 2015-11-12 NOTE — Progress Notes (Signed)
Patient's Name: James Friedman MRN: [id number] DOB: 12/08/1945 DOS: 11/12/2015  Primary Reason(s) for Visit: Post-Procedure evaluation CC: Back Pain; Shoulder Pain; and Knee Pain   HPI  James Friedman is a 70 y.o. year old, male patient, who returns today as an established patient. He has Hyperlipidemia; Hypertension; Chest pain; Chronic pain; Pain in shoulder (Bilateral) (L>R); Non-traumatic rotator cuff tear; Chronic pain syndrome; Chronic low back pain (Location of Primary Source of Pain) (Bilateral) (L>R); Lumbar spondylosis; Lumbosacral radiculopathy at S1 (Right side); Lumbar facet syndrome (Bilateral) (L>R); Chronic neck pain (Bilateral) (R>L); Chronic cervical radicular pain (Bilateral) (R>L); Cervical spondylosis; Morbid obesity (HCC); Long term current use of opiate analgesic; Long term prescription opiate use; Opiate use; Opiate dependence (HCC); Chronic sacroiliac joint pain (Bilateral) (R>L); Failed neck surgery syndrome; Chronic upper extremity pain (Bilateral) (R>L); History of bilateral knee surgery; Diffuse myofascial pain syndrome; Lumbar facet hypertrophy (L4-5 and L5-S1); Carpal tunnel syndrome on right (Released); Transient weakness of left lower extremity; At high risk for falls; Lumbar spinal stenosis (L3-4); 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 His primarily concern today is the Back Pain; Shoulder Pain; and Knee Pain   The patient returns to the clinics  today after having had a right sided lumbar facet radiofrequency ablation under fluoroscopic guidance and IV sedation. The patient indicates having done great with this procedure but he still has a little bit of pain on the opposite side that he did not have done. We'll go ahead and schedule that to be done in the near future. Today the patient indicates that his worst pain is in the knee and his shoulder and this has to do with a fall that he had from his bed when he was having a [place] flashback dream. He refers that he quickly rolled on the bed and he fell off hitting his right shoulder and right knee. He can move the shoulder, but he has some restrictions to it and in the case of the knee he feels that he cannot put any weight on it since it feels that it gives out.  He is morbidly obese and has a BMI of 43.1 which does not help his problems. He has a history of knee and hip pain as well as low back pain, which is likely associated with osteoarthritis and his weight.  He indicates that he did not go to the emergency room with this happened and therefore today we will order an MRI of his right knee to evaluate whether or not he tore a ligament as he is having pain in the lateral anterior portion of the knee joint and appears to be complaining about instability.  In addition to this I will be bringing him back for a right intra-articular shoulder injection in 2 weeks after that we will go ahead and do the radiofrequency of the left lumbar facets. He continues to have pain down both of his legs with numbness and tingling and because of his diabetes and herniated disks, I will be ordering a nerve conduction test to determine if this is mostly  due to a diabetic peripheral neuropathy or symptoms of a radiculopathy.  Reported Pain Score: 8  (last pain medicine this a.m.), clinically he looks like a 2-3/10. Reported level is inconsistent with clinical obrservations. Pain Type: Chronic pain Pain Location:  Back (right shoulder and R knee) Pain Orientation: Lower Pain Descriptors / Indicators: Constant Pain Frequency: Constant  Date of Last Visit: 08/28/15 Service Provided on Last Visit: Procedure  Controlled Substance Pharmacotherapy Assessment  Analgesic: The patient is currently getting his medications from another practice. According to him he takes oxycodone IR 10 mg every 6 hours when necessary for pain. In addition to taking morphine ER 15 mg every 8 hours. MME/day: 105 mg/day Monitoring: UDS Results/interpretation: The patient's last UDS was done on 07/17/2015 and it came back with unexpected results as the extended release morphine had not been declared. The sample also detected ethyl alcohol and glucose, suggesting for the alcohol to be a metabolite of the fermentation of glucose. This is normal for patients with diabetes. Risk Assessment: Substance Use Disorder (SUD) Risk Level: Low Opioid Risk Tool (ORT) Score: Total Score: 1 Depression Scale Score:    Pharmacologic Plan: Were not planning on taking over his medication regimen at this time. the patient gets his medications from the [place] due to cost.  Lab Work: Illicit Drugs No results found for: THCU, COCAINSCRNUR, PCPSCRNUR, MDMA, AMPHETMU, METHADONE, ETOH  Inflammation Markers Lab Results  Component Value Date   ESRSEDRATE 7 10/11/2013    Renal Function Lab Results  Component Value Date   BUN 16 10/11/2013   CREATININE 1.13 10/11/2013   GFRAA >60 10/11/2013   GFRNONAA >60 10/11/2013    Hepatic Function Lab Results  Component Value Date   AST 20 10/11/2013   ALT 28 10/11/2013   ALBUMIN 3.8 10/11/2013    Electrolytes Lab Results  Component Value Date   NA 135* 10/11/2013   K 4.6 10/11/2013   CL 98 10/11/2013   CALCIUM 8.5 10/11/2013   MG 1.8 05/24/2014    Allergies  Mr. Coggeshall has No Known Allergies.  Meds  The patient has a current medication list which includes the following prescription(s):  albuterol, amlodipine, aspirin, atorvastatin, azelastine, capsaicin, cetirizine, chlorpheniramine, vitamin d3, clotrimazole, duloxetine, gabapentin, hydroxyzine, insulin aspart, insulin detemir, lidocaine, magnesium, metformin, metoprolol, morphine, multivitamin, naproxen, fish oil, omeprazole, oxycodone hcl, oxygen-helium, testosterone cypionate, trazodone, triamcinolone cream, and vitamin c.  Current Outpatient Prescriptions on File Prior to Visit  Medication Sig  . albuterol (PROAIR HFA) 108 (90 BASE) MCG/ACT inhaler Inhale 2 puffs into the lungs every 6 (six) hours as needed for wheezing.  Marland Kitchen amLODipine (NORVASC) 5 MG tablet Take 5 mg by mouth daily.   Marland Kitchen aspirin 81 MG tablet Take 81 mg by mouth daily.  Marland Kitchen atorvastatin (LIPITOR) 80 MG tablet Take 80 mg by mouth daily.  Marland Kitchen azelastine (ASTELIN) 0.1 % nasal spray Place 1 spray into both nostrils 2 (two) times daily. Use in each nostril as directed  . capsaicin (ZOSTRIX) 0.025 % cream Apply 1 application topically 2 (two) times daily.  . cetirizine (ZYRTEC) 10 MG tablet Take 10 mg by mouth daily.  . chlorpheniramine (CHLOR-TRIMETON) 4 MG tablet Take 4 mg by mouth every 6 (six) hours as needed for allergies.  . Cholecalciferol (VITAMIN D3) 5000 UNITS TABS Take 1 tablet by mouth daily.  . Clotrimazole 1 % OINT Apply 1 application topically daily.  . DULoxetine (CYMBALTA) 30 MG capsule Take 30 mg by mouth 2 (two) times daily.  Marland Kitchen gabapentin (  NEURONTIN) 600 MG tablet Take 600 mg by mouth 3 (three) times daily.  . hydrOXYzine (ATARAX/VISTARIL) 25 MG tablet Take 25 mg by mouth every 6 (six) hours as needed for itching.  . insulin aspart (NOVOLOG) 100 UNIT/ML injection Inject 40 Units into the skin 3 (three) times daily before meals.   . insulin detemir (LEVEMIR) 100 UNIT/ML injection Inject 70 Units into the skin 2 (two) times daily.  Marland Kitchen lidocaine (LIDODERM) 5 % Place 1 patch onto the skin daily. Remove & Discard patch within 12 hours or as directed by MD  .  Magnesium 400 MG TABS Take 1 tablet by mouth daily.  . metFORMIN (GLUCOPHAGE) 1000 MG tablet Take 1,000 mg by mouth 2 (two) times daily with a meal.  . metoprolol (LOPRESSOR) 100 MG tablet Take 100 mg by mouth 2 (two) times daily.  Marland Kitchen morphine (MS CONTIN) 15 MG 12 hr tablet Take 15 mg by mouth every 8 (eight) hours.   . Multiple Vitamin (MULTIVITAMIN) tablet Take 1 tablet by mouth daily.  . naproxen (NAPROSYN) 500 MG tablet Take 500 mg by mouth 2 (two) times daily with a meal.  . Omega-3 Fatty Acids (FISH OIL) 1000 MG CAPS Take by mouth 2 (two) times daily.  Marland Kitchen omeprazole (PRILOSEC) 20 MG capsule Take 20 mg by mouth daily.  . Oxycodone HCl 10 MG TABS Take 10 mg by mouth every 6 (six) hours as needed.   . OXYGEN Inhale 3 L into the lungs at bedtime.  Marland Kitchen testosterone cypionate (DEPOTESTOTERONE CYPIONATE) 200 MG/ML injection Inject 200 mg into the muscle every 14 (fourteen) days.  . traZODone (DESYREL) 150 MG tablet Take 150 mg by mouth at bedtime.  . triamcinolone cream (KENALOG) 0.5 % Apply topically 2 (two) times daily.  . vitamin C (ASCORBIC ACID) 500 MG tablet Take 500 mg by mouth daily.   No current facility-administered medications on file prior to visit.    ROS  Constitutional: Afebrile, no chills, well hydrated and well nourished Gastrointestinal: negative Musculoskeletal:negative Neurological: negative Behavioral/Psych: negative  PFSH  Medical:  Mr. Fiola  has a past medical history of Morbid obesity (HCC); Diabetes mellitus without complication (HCC); DDD (degenerative disc disease); Carpal tunnel syndrome; OA (osteoarthritis); OSA (obstructive sleep apnea); Anxiety; BPH (benign prostatic hypertrophy); Ventral hernia; Heme positive stool; Asthma; Hyperlipidemia; Essential hypertension; History of bilateral knee surgery (07/17/2015); Carpal tunnel syndrome on right (07/17/2015); Wears dentures; Falls; and Atypical chest pain. Family: family history includes Heart attack in his  father. Surgical:  has past surgical history that includes Knee surgery (Bilateral); Neck surgery; Carpal tunnel release; Shoulder surgery (Right); and Cataract extraction, extracapsular (Left, 08/04/2015). Tobacco:  reports that he has quit smoking. His smoking use included Cigarettes. He has a 3.75 pack-year smoking history. He does not have any smokeless tobacco history on file. Alcohol:  reports that he does not drink alcohol. Drug:  reports that he does not use illicit drugs.  Physical Exam  Vitals:  Today's Vitals   11/12/15 1003  BP: 115/76  Pulse: 91  Temp: 98.4 F (36.9 C)  TempSrc: Oral  Resp: 16  Height: 5\' 9"  (1.753 m)  Weight: 292 lb (132.45 kg)  SpO2: 93%  PainSc: 8     Calculated BMI: Body mass index is 43.1 kg/(m^2).  General appearance: alert, cooperative, appears stated age, mild distress and morbidly obese Eyes: PERLA Respiratory: No evidence respiratory distress, no audible rales or ronchi and no use of accessory muscles of respiration  Cervical Spine Inspection: Normal anatomy  Alignment: Symetrical ROM: Adequate  Upper Extremities Inspection: No gross anomalies detected ROM: Adequate Sensory: Normal Motor: Unremarkable  Thoracic Spine Inspection: No gross anomalies detected Alignment: Symetrical ROM: Adequate  Lumbar Spine Inspection: No gross anomalies detected Alignment: Symetrical ROM: Adequate  Gait: Patient comes in today in a wheel chair  Lower Extremities Inspection: No gross anomalies detected ROM: Decreased for the knees and hips, bilaterally. Sensory:  Normal Motor: Guarding with a component of weakness.  Assessment & Plan  Primary Diagnosis & Pertinent Problem List: The primary encounter diagnosis was Acute pain of right shoulder. Diagnoses of Acute pain of right knee, Bilateral chronic knee pain, Lumbar facet syndrome (Bilateral), Lumbar spondylosis, unspecified spinal osteoarthritis, Chronic pain of lower extremity,  unspecified laterality, Lumbar disc herniation (Left L4-5) (Right L5-S1), Diabetic peripheral neuropathy (HCC), and Chronic lumbar radicular pain (Location of Secondary source of pain) (Bilateral) (R>L) (S1 Dermatome on the left) were also pertinent to this visit.  Visit Diagnosis: 1. Acute pain of right shoulder   2. Acute pain of right knee   3. Bilateral chronic knee pain   4. Lumbar facet syndrome (Bilateral)   5. Lumbar spondylosis, unspecified spinal osteoarthritis   6. Chronic pain of lower extremity, unspecified laterality   7. Lumbar disc herniation (Left L4-5) (Right L5-S1)   8. Diabetic peripheral neuropathy (HCC)   9. Chronic lumbar radicular pain (Location of Secondary source of pain) (Bilateral) (R>L) (S1 Dermatome on the left)     Problem-specific Plan(s): No problem-specific assessment & plan notes found for this encounter.   Plan of Care  Pharmacotherapy (Medications Ordered): No orders of the defined types were placed in this encounter.    Lab-work & Procedure Ordered: Orders Placed This Encounter  Procedures  . SHOULDER INJECTION    Standing Status: Future     Number of Occurrences:      Standing Expiration Date: 11/11/2016    Scheduling Instructions:     Please schedule the patient for a right intra-articular shoulder injection under fluoroscopic guidance, no sedation.    Order Specific Question:  Where will this procedure be performed?    Answer:  [facility]  . Radiofrequency,Lumbar    Standing Status: Future     Number of Occurrences:      Standing Expiration Date: 11/11/2016    Scheduling Instructions:     Side(s): Left-sided     Level(s): L2, L3, L4, L5, & S1 Medial Branch Nerves     Sedation: With Sedation.     Timeframe: Schedule for 2 weeks after his shoulder injection.    Order Specific Question:  Where will this procedure be performed?    Answer:  [facility]  . MR Knee Right Wo Contrast    Standing Status: Future     Number  of Occurrences:      Standing Expiration Date: 11/11/2016    Order Specific Question:  Reason for Exam (SYMPTOM  OR DIAGNOSIS REQUIRED)    Answer:  Right knee pain/arthralgia    Order Specific Question:  Preferred imaging location?    Answer:  [facility]    Order Specific Question:  Does the patient have a pacemaker or implanted devices?    Answer:  No    Order Specific Question:  What is the patient's sedation requirement?    Answer:  No Sedation    Order Specific Question:  Call Results- Best Contact Number?    Answer:  [phone] (Pain Clinic facility) (Dr. Laban Emperor)  . NCV with EMG(electromyography)  Bilateral testing requested.    Standing Status: Future     Number of Occurrences:      Standing Expiration Date: 11/11/2016    Scheduling Instructions:     Please refer this patient to [facility] for Nerve Conduction testing of the lower extremities. (EMG & PNCV)    Order Specific Question:  Where should this test be performed?    Answer:  Other    Imaging Ordered: MR KNEE RIGHT WO CONTRAST  Interventional Therapies: Scheduled: Right intra-articular shoulder injection under fluoroscopic guidance, without sedation. PRN Procedures: Possible intra-articular right knee injection under fluoroscopic guidance, no sedation.    Referral(s) or Consult(s): The patient is being referred to [facility] for EMG/PNCV of both lower extremities to determine the extent of his peripheral neuropathy versus the possibility of an overlapping lumbar radiculopathy.  Medications administered during this visit: Mr. Shular had no medications administered during this visit.  No future appointments.  Primary Care Physician: Pcp Not In System Location: [facility] Facility Note by: [place] Sloop A. Laban Emperor, M.D, DABA, DABAPM, DABPM, DABIPP, FIPP

## 2015-11-12 NOTE — Progress Notes (Signed)
Post procedure evaluation.  Patient needs to talk to you re; more treatment and possible procedure.

## 2015-11-14 ENCOUNTER — Other Ambulatory Visit: Payer: Self-pay | Admitting: Geriatric Medicine

## 2015-11-14 DIAGNOSIS — G894 Chronic pain syndrome: Secondary | ICD-10-CM

## 2015-11-25 ENCOUNTER — Ambulatory Visit: Payer: Medicare (Managed Care) | Attending: Pain Medicine | Admitting: Pain Medicine

## 2015-11-25 ENCOUNTER — Encounter: Payer: Self-pay | Admitting: Pain Medicine

## 2015-11-25 VITALS — BP 142/98 | HR 89 | Temp 98.2°F | Resp 16 | Ht 69.0 in | Wt 293.0 lb

## 2015-11-25 DIAGNOSIS — M25511 Pain in right shoulder: Secondary | ICD-10-CM | POA: Diagnosis not present

## 2015-11-25 DIAGNOSIS — E785 Hyperlipidemia, unspecified: Secondary | ICD-10-CM | POA: Insufficient documentation

## 2015-11-25 DIAGNOSIS — G8929 Other chronic pain: Secondary | ICD-10-CM | POA: Insufficient documentation

## 2015-11-25 DIAGNOSIS — M5126 Other intervertebral disc displacement, lumbar region: Secondary | ICD-10-CM | POA: Diagnosis not present

## 2015-11-25 DIAGNOSIS — Z9889 Other specified postprocedural states: Secondary | ICD-10-CM | POA: Insufficient documentation

## 2015-11-25 DIAGNOSIS — M542 Cervicalgia: Secondary | ICD-10-CM | POA: Insufficient documentation

## 2015-11-25 DIAGNOSIS — E1140 Type 2 diabetes mellitus with diabetic neuropathy, unspecified: Secondary | ICD-10-CM | POA: Diagnosis not present

## 2015-11-25 DIAGNOSIS — M545 Low back pain: Secondary | ICD-10-CM | POA: Insufficient documentation

## 2015-11-25 DIAGNOSIS — M533 Sacrococcygeal disorders, not elsewhere classified: Secondary | ICD-10-CM | POA: Insufficient documentation

## 2015-11-25 DIAGNOSIS — M4806 Spinal stenosis, lumbar region: Secondary | ICD-10-CM | POA: Diagnosis not present

## 2015-11-25 DIAGNOSIS — M25519 Pain in unspecified shoulder: Secondary | ICD-10-CM | POA: Diagnosis present

## 2015-11-25 DIAGNOSIS — G5601 Carpal tunnel syndrome, right upper limb: Secondary | ICD-10-CM | POA: Diagnosis not present

## 2015-11-25 DIAGNOSIS — M47896 Other spondylosis, lumbar region: Secondary | ICD-10-CM | POA: Insufficient documentation

## 2015-11-25 DIAGNOSIS — M7551 Bursitis of right shoulder: Secondary | ICD-10-CM

## 2015-11-25 DIAGNOSIS — I10 Essential (primary) hypertension: Secondary | ICD-10-CM | POA: Insufficient documentation

## 2015-11-25 DIAGNOSIS — M75100 Unspecified rotator cuff tear or rupture of unspecified shoulder, not specified as traumatic: Secondary | ICD-10-CM | POA: Insufficient documentation

## 2015-11-25 DIAGNOSIS — M25512 Pain in left shoulder: Secondary | ICD-10-CM | POA: Diagnosis not present

## 2015-11-25 DIAGNOSIS — M4316 Spondylolisthesis, lumbar region: Secondary | ICD-10-CM | POA: Diagnosis not present

## 2015-11-25 DIAGNOSIS — R531 Weakness: Secondary | ICD-10-CM | POA: Insufficient documentation

## 2015-11-25 DIAGNOSIS — Z79891 Long term (current) use of opiate analgesic: Secondary | ICD-10-CM | POA: Diagnosis not present

## 2015-11-25 MED ORDER — METHYLPREDNISOLONE ACETATE 80 MG/ML IJ SUSP
80.0000 mg | Freq: Once | INTRAMUSCULAR | Status: AC
  Administered 2015-11-25: 10:00:00 via INTRA_ARTICULAR

## 2015-11-25 MED ORDER — ROPIVACAINE HCL 2 MG/ML IJ SOLN
INTRAMUSCULAR | Status: AC
  Filled 2015-11-25: qty 10

## 2015-11-25 MED ORDER — LIDOCAINE HCL (PF) 1 % IJ SOLN
INTRAMUSCULAR | Status: DC
Start: 2015-11-25 — End: 2015-11-25
  Filled 2015-11-25: qty 5

## 2015-11-25 MED ORDER — ROPIVACAINE HCL 2 MG/ML IJ SOLN
9.0000 mL | Freq: Once | INTRAMUSCULAR | Status: AC
Start: 2015-11-25 — End: 2015-11-25
  Administered 2015-11-25: 10:00:00

## 2015-11-25 MED ORDER — LIDOCAINE HCL (PF) 1 % IJ SOLN
INTRAMUSCULAR | Status: AC
  Filled 2015-11-25: qty 5

## 2015-11-25 MED ORDER — LIDOCAINE HCL (PF) 1 % IJ SOLN
10.0000 mL | Freq: Once | INTRAMUSCULAR | Status: AC
  Administered 2015-11-25: 10:00:00

## 2015-11-25 MED ORDER — METHYLPREDNISOLONE ACETATE 80 MG/ML IJ SUSP
INTRAMUSCULAR | Status: AC
  Filled 2015-11-25: qty 1

## 2015-11-25 NOTE — Progress Notes (Signed)
Safety precautions to be maintained throughout the outpatient stay will include: orient to surroundings, keep bed in low position, maintain call bell within reach at all times, provide assistance with transfer out of bed and ambulation.  

## 2015-11-25 NOTE — Progress Notes (Addendum)
Patient's Name: James Friedman MRN: [id number] DOB: Aug 18, 1946 DOS: 11/25/2015  Primary Reason(s) for Visit: Interventional Pain Management Treatment. CC: Shoulder Pain (right)   Pre-Procedure Assessment:  James Friedman is a 70 y.o. year old, male patient, seen today for interventional treatment. He has Hyperlipidemia; Pain in shoulder (Bilateral) (L>R); Non-traumatic rotator cuff tear; Chronic pain syndrome; Chronic low back pain (Location of Primary Source of Pain) (Bilateral) (L>R); Lumbosacral radiculopathy at S1 (Right side); Lumbar facet syndrome (Bilateral) (L>R); Chronic neck pain (Bilateral) (R>L); Chronic cervical radicular pain (Bilateral) (R>L); Cervical spondylosis; Morbid obesity ([place]); Long term current use of opiate analgesic; Long term prescription opiate use; Opiate use (105 MME/Day); Opiate dependence ([place]); Chronic sacroiliac joint pain (Bilateral) (R>L); Failed neck surgery syndrome; Chronic upper extremity pain (Bilateral) (R>L); History of bilateral knee surgery; Diffuse myofascial pain syndrome; Lumbar facet hypertrophy (L4-5 and L5-S1); Carpal tunnel syndrome on right ([place]); Transient weakness of left lower extremity; At high risk for falls; Lumbar spinal stenosis (L3-4); 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); Grade 1 Anterolisthesis of L4 over L5; Lumbar disc herniation (Left L4-5) (Right L5-S1); Chronic lower extremity pain (Location of Secondary source of pain) (Bilateral) (R>L); Essential hypertension; Diabetes mellitus without complication ([place]); Atypical chest pain; Acute pain of right shoulder; Acute pain of right knee; Chronic knee pain (Bilateral); Diabetic peripheral neuropathy ([place]); Chronic lumbar radicular pain (Location of Secondary source of pain) (Bilateral) (R>L) (S1 Dermatome on the left); Abnormal EMG/PNCV (11/26/2015); Postoperative pain (post-radiofrequency); Lumbar spondylosis; COPD 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 ([place]); Polyneuropathy ([place]); and Bursitis of right  shoulder on his problem list.. His primarily concern today is the Shoulder Pain (right)   Today's Initial Pain Score: 9/10 Reported level of pain is incompatible with clinical obrservations. This may be secondary to a possible lack of understanding on how the pain scale works. Pain Descriptors / Indicators: Constant, Aching  Post-procedure Pain Score: 6   Date of Last Visit: 11/12/15 Service Provided on Last Visit: Med Refill, Evaluation  Verification of the correct person, correct site (including marking of site), and correct procedure were performed and confirmed by the patient.  Today's Vitals   11/25/15 1016 11/25/15 1020 11/25/15 1023 11/25/15 1031  BP: (!) 145/99 (!) 141/97 (!) 142/98   Pulse: 90 91 89   Resp: '16 16 16   ' Temp:      TempSrc:      SpO2: 94% 96% 94%   Weight:      Height:      PainSc:   8  6   Calculated BMI: Body mass index is 43.27 kg/m. Allergies: He has No Known Allergies.. Acute pain of right shoulder [M25.511]  Procedure:  Type: Therapeutic Subscapular Bursa Injection Region: Entire Shoulder Area Level: Shoulder Laterality: Right-Sided  Indications: 1. Acute pain of right shoulder   2. Right shoulder pain   3. Bursitis of right shoulder     In addition, James Friedman has Pain in shoulder (Bilateral) (L>R); Non-traumatic rotator cuff tear; Chronic pain syndrome; Chronic low back pain (Location of Primary Source of Pain) (Bilateral) (L>R); Lumbosacral radiculopathy at S1 (Right side); Lumbar facet syndrome (Bilateral) (L>R); Chronic neck pain (Bilateral) (R>L); Chronic cervical radicular pain (Bilateral) (R>L); Cervical spondylosis; Chronic sacroiliac joint pain (Bilateral) (R>L); Failed neck surgery syndrome; Chronic upper extremity pain (Bilateral) (R>L); Diffuse myofascial pain syndrome; Lumbar facet hypertrophy (L4-5 and L5-S1); Transient weakness of left lower extremity; Lumbar spinal stenosis (L3-4); 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);  Grade 1 Anterolisthesis of L4 over L5; Lumbar disc herniation (Left L4-5) (Right  L5-S1); Chronic lower extremity pain (Location of Secondary source of pain) (Bilateral) (R>L); Acute pain of right shoulder; Acute pain of right knee; Chronic knee pain (Bilateral); Diabetic peripheral neuropathy ([place]); Chronic lumbar radicular pain (Location of Secondary source of pain) (Bilateral) (R>L) (S1 Dermatome on the left); Abnormal EMG/PNCV (11/26/2015); Postoperative pain (post-radiofrequency); Lumbar spondylosis; Polyneuropathy ([place]); and Bursitis of right shoulder on his pertinent problem list.  Consent: Secured. Under the influence of no sedatives a written informed consent was obtained, after having provided information on the risks and possible complications. To fulfill our ethical and legal obligations, as recommended by the American Medical Association's Code of Ethics, we have provided information to the patient about our clinical impression; the nature and purpose of the treatment or procedure; the risks, benefits, and possible complications of the intervention; alternatives; the risk(s) and benefit(s) of the alternative treatment(s) or procedure(s); and the risk(s) and benefit(s) of doing nothing. The patient was provided information about the risks and possible complications associated with the procedure. These include, but are not limited to, failure to achieve desired goals, infection, bleeding, organ or nerve damage, allergic reactions, paralysis, and death. In the case of intra- or periarticular procedures these may include, but are not limited to, failure to achieve desired goals, infection, bleeding (hemarthrosis), organ or nerve damage, allergic reactions, and death. In addition, the patient was informed that Medicine is not an exact science; therefore, there is also the possibility of unforeseen risks and possible complications that may result in a catastrophic outcome. The patient indicated having  understood very clearly. We have given the patient no guarantees and we have made no promises. Enough time was given to the patient to ask questions, all of which were answered to the patient's satisfaction.  Pre-Procedure Preparation: Safety Precautions: Allergies reviewed. Appropriate site, procedure, and patient were confirmed by following the Joint Commission's Universal Protocol (UP.01.01.01), in the form of a "Time Out". The patient was asked to confirm marked site and procedure, before commencing. The patient was asked about blood thinners, or active infections, both of which were denied. Patient was assessed for positional comfort and all pressure points were checked before starting procedure. Monitoring:  As per clinic protocol. Infection Control Precautions: Sterile technique used. Standard Universal Precautions were taken as recommended by the Department of [facility] for Disease Control and Prevention (CDC). Standard pre-surgical skin prep was conducted. Respiratory hygiene and cough etiquette was practiced. Hand hygiene observed. Safe injection practices and needle disposal techniques followed. SDV (single dose vial) medications used. Medications properly checked for expiration dates and contaminants. Personal protective equipment (PPE) used: Sterile Radiation-resistant gloves.  Anesthesia, Analgesia, Anxiolysis:  Type: Local Anesthesia Local Anesthetic: Lidocaine 1% Route: Infiltration (Nectar/IM) IV Access: Declined Sedation: Declined  Indication(s): Analgesia    Description of Procedure Process:  Time-out: "Time-out" completed before starting procedure, as per protocol. Position: Supine Target Area: Glonohumeral Joint (shoulder) Approach: Entire approach. Area Prepped: Entire shoulder Area Prepping solution: ChloraPrep (2% chlorhexidine gluconate and 70% isopropyl alcohol) Safety Precautions: Aspiration looking for blood return was conducted prior to all injections. At no  point did we inject any substances, as a needle was being advanced. No attempts were made at seeking any paresthesias. Safe injection practices and needle disposal techniques used. Medications properly checked for expiration dates. SDV (single dose vial) medications used. Latex Allergy precautions taken. Contrast Allergy precautions taken.   Description of the Procedure: Protocol guidelines were followed. The patient was placed in position over the procedure table. The target  area was identified and the area prepped in the usual manner. Skin & deeper tissues infiltrated with local anesthetic. Appropriate amount of time allowed to pass for local anesthetics to take effect. The procedure needles were then advanced to the target area. Proper needle placement secured. Negative aspiration confirmed. Solution injected in intermittent fashion, asking for systemic symptoms every 0.5cc of injectate. The needles were then removed and the area cleansed, making sure to leave some of the prepping solution back to take advantage of its long term bactericidal properties. EBL: None Materials & Medications Used:  Needle(s) Used: 22g - 3.5" Spinal Needle(s) Solution Injected: 0.2% PF-Ropivacaine (66m) + SDV-DepoMedrol 80 mg/ml (171m Medications Administered today: We administered methylPREDNISolone acetate, lidocaine (PF), ropivacaine (PF) 2 mg/ml (0.2%), ropivacaine (PF) 2 mg/ml (0.2%), methylPREDNISolone acetate, and lidocaine (PF).Please see chart orders for dosing details.  Imaging Guidance:  Type of Imaging Technique: Fluoroscopy Guidance (Non-spinal) Indication(s): Assistance in needle guidance and placement for procedures requiring needle placement in or near specific anatomical locations not easily accessible without such assistance. Exposure Time: Please see nurses notes. Contrast: None used. Fluoroscopic Guidance: I was personally present in the fluoroscopy suite, where the patient was placed in position for  the procedure, over the fluoroscopy-compatible table. Fluoroscopy was manipulated, using "Tunnel Vision Technique", to obtain the best possible view of the target area, on the affected side. Parallax error was corrected before commencing the procedure. A "direction-depth-direction" technique was used to introduce the needle under continuous pulsed fluoroscopic guidance. Once the target was reached, antero-posterior, oblique, and lateral fluoroscopic projection views were taken to confirm needle placement in all planes. Permanently recorded images stored by scanning into EMR. Interpretation: No contrast injected. Intraoperative imaging interpretation by performing Physician. Adequate needle placement confirmed. Permanent hardcopy images in multiple planes scanned into the patient's record.  Antibiotic Prophylaxis:  Indication(s): No indications identified. Type:  Antibiotics Given (last 72 hours)    None       Post-operative Assessment:  Complications: No immediate post-treatment complications were observed. Disposition: The patient was discharged home, once institutional criteria were met. Return to clinic in 2 weeks for follow-up evaluation and interpretation of results. The patient tolerated the entire procedure well. A repeat set of vitals were taken after the procedure and the patient was kept under observation following institutional policy, for this type of procedure. Post-procedural neurological assessment was performed, showing return to baseline, prior to discharge. The patient was provided with post-procedure discharge instructions, including a section on how to identify potential problems. Should any problems arise concerning this procedure, the patient was given instructions to immediately contact us[place]at any time, without hesitation. In any case, we plan to contact the patient by telephone for a follow-up status report regarding this interventional procedure. Comments:  No additional  relevant information.  Primary Care Physician: SHGareth MorganMD [facility] Note by: FrKathlen BrunswickNaDossie ArbourM.D, DABA, DABAPM, DABPM, DABIPP, FIPP  Disclaimer:  Medicine is not an exact science. The only guarantee in medicine is that nothing is guaranteed. It is important to note that the decision to proceed with this intervention was based on the information collected from the patient. The Data and conclusions were drawn from the patient's questionnaire, the interview, and the physical examination. Because the information was provided in large part by the patient, it cannot be guaranteed that it has not been purposely or unconsciously manipulated. Every effort has been made to obtain as much relevant data as possible for this evaluation.  It is important to note that the conclusions that lead to this procedure are derived in large part from the available data. Always take into account that the treatment will also be dependent on availability of resources and existing treatment guidelines, considered by other Pain Management Practitioners as being common knowledge and practice, at the time of the intervention. For Medico-Legal purposes, it is also important to point out that variation in procedural techniques and pharmacological choices are the acceptable norm. The indications, contraindications, technique, and results of the above procedure should only be interpreted and judged by a Board-Certified Interventional Pain Specialist with extensive familiarity and expertise in the same exact procedure and technique. Attempts at providing opinions without similar or greater experience and expertise than that of the treating physician will be considered as inappropriate and unethical, and shall result in a formal complaint to the state medical board and applicable specialty societies.

## 2015-11-25 NOTE — Patient Instructions (Signed)

## 2015-11-26 ENCOUNTER — Telehealth: Payer: Self-pay | Admitting: *Deleted

## 2015-11-26 NOTE — Telephone Encounter (Signed)
No problems post procedure. 

## 2015-12-04 ENCOUNTER — Ambulatory Visit
Admission: RE | Admit: 2015-12-04 | Discharge: 2015-12-04 | Disposition: A | Discharge status: Home / Self Care | Payer: Medicare (Managed Care) | Source: Ambulatory Visit | Attending: Geriatric Medicine | Admitting: Geriatric Medicine

## 2015-12-04 ENCOUNTER — Encounter: Payer: Self-pay | Admitting: Pain Medicine

## 2015-12-04 DIAGNOSIS — S83511A Sprain of anterior cruciate ligament of right knee, initial encounter: Secondary | ICD-10-CM | POA: Diagnosis not present

## 2015-12-04 DIAGNOSIS — M7121 Synovial cyst of popliteal space [Baker], right knee: Secondary | ICD-10-CM | POA: Diagnosis not present

## 2015-12-04 DIAGNOSIS — X58XXXA Exposure to other specified factors, initial encounter: Secondary | ICD-10-CM | POA: Diagnosis not present

## 2015-12-04 DIAGNOSIS — G8929 Other chronic pain: Secondary | ICD-10-CM | POA: Insufficient documentation

## 2015-12-04 DIAGNOSIS — G894 Chronic pain syndrome: Secondary | ICD-10-CM

## 2015-12-04 DIAGNOSIS — R94130 Abnormal response to nerve stimulation, unspecified: Secondary | ICD-10-CM | POA: Insufficient documentation

## 2015-12-04 DIAGNOSIS — M25561 Pain in right knee: Secondary | ICD-10-CM | POA: Diagnosis present

## 2015-12-04 DIAGNOSIS — M25461 Effusion, right knee: Secondary | ICD-10-CM | POA: Diagnosis not present

## 2015-12-09 ENCOUNTER — Encounter: Payer: Self-pay | Admitting: Pain Medicine

## 2015-12-09 ENCOUNTER — Ambulatory Visit: Payer: Medicare (Managed Care) | Attending: Pain Medicine | Admitting: Pain Medicine

## 2015-12-09 VITALS — BP 166/74 | HR 87 | Temp 97.8°F | Resp 16 | Ht 69.0 in | Wt 292.0 lb

## 2015-12-09 DIAGNOSIS — G8929 Other chronic pain: Secondary | ICD-10-CM | POA: Diagnosis not present

## 2015-12-09 DIAGNOSIS — M25511 Pain in right shoulder: Secondary | ICD-10-CM | POA: Diagnosis not present

## 2015-12-09 DIAGNOSIS — R531 Weakness: Secondary | ICD-10-CM | POA: Diagnosis not present

## 2015-12-09 DIAGNOSIS — M5417 Radiculopathy, lumbosacral region: Secondary | ICD-10-CM | POA: Diagnosis not present

## 2015-12-09 DIAGNOSIS — G8918 Other acute postprocedural pain: Secondary | ICD-10-CM | POA: Insufficient documentation

## 2015-12-09 DIAGNOSIS — M25561 Pain in right knee: Secondary | ICD-10-CM | POA: Insufficient documentation

## 2015-12-09 DIAGNOSIS — R079 Chest pain, unspecified: Secondary | ICD-10-CM | POA: Insufficient documentation

## 2015-12-09 DIAGNOSIS — M75100 Unspecified rotator cuff tear or rupture of unspecified shoulder, not specified as traumatic: Secondary | ICD-10-CM | POA: Diagnosis not present

## 2015-12-09 DIAGNOSIS — E119 Type 2 diabetes mellitus without complications: Secondary | ICD-10-CM | POA: Insufficient documentation

## 2015-12-09 DIAGNOSIS — I10 Essential (primary) hypertension: Secondary | ICD-10-CM | POA: Diagnosis not present

## 2015-12-09 DIAGNOSIS — M47816 Spondylosis without myelopathy or radiculopathy, lumbar region: Secondary | ICD-10-CM | POA: Diagnosis not present

## 2015-12-09 DIAGNOSIS — R94131 Abnormal electromyogram [EMG]: Secondary | ICD-10-CM | POA: Insufficient documentation

## 2015-12-09 DIAGNOSIS — Q762 Congenital spondylolisthesis: Secondary | ICD-10-CM

## 2015-12-09 DIAGNOSIS — Z9889 Other specified postprocedural states: Secondary | ICD-10-CM | POA: Insufficient documentation

## 2015-12-09 DIAGNOSIS — M545 Low back pain: Secondary | ICD-10-CM | POA: Diagnosis not present

## 2015-12-09 DIAGNOSIS — M25512 Pain in left shoulder: Secondary | ICD-10-CM | POA: Diagnosis not present

## 2015-12-09 DIAGNOSIS — M47896 Other spondylosis, lumbar region: Secondary | ICD-10-CM | POA: Insufficient documentation

## 2015-12-09 DIAGNOSIS — Z79891 Long term (current) use of opiate analgesic: Secondary | ICD-10-CM | POA: Diagnosis not present

## 2015-12-09 DIAGNOSIS — M4316 Spondylolisthesis, lumbar region: Secondary | ICD-10-CM | POA: Diagnosis not present

## 2015-12-09 DIAGNOSIS — M5126 Other intervertebral disc displacement, lumbar region: Secondary | ICD-10-CM | POA: Insufficient documentation

## 2015-12-09 DIAGNOSIS — E785 Hyperlipidemia, unspecified: Secondary | ICD-10-CM | POA: Diagnosis not present

## 2015-12-09 DIAGNOSIS — M4806 Spinal stenosis, lumbar region: Secondary | ICD-10-CM | POA: Insufficient documentation

## 2015-12-09 DIAGNOSIS — M549 Dorsalgia, unspecified: Secondary | ICD-10-CM | POA: Diagnosis present

## 2015-12-09 DIAGNOSIS — Z6841 Body Mass Index (BMI) 40.0 and over, adult: Secondary | ICD-10-CM | POA: Insufficient documentation

## 2015-12-09 DIAGNOSIS — M791 Myalgia: Secondary | ICD-10-CM | POA: Diagnosis not present

## 2015-12-09 DIAGNOSIS — M4310 Spondylolisthesis, site unspecified: Secondary | ICD-10-CM

## 2015-12-09 MED ORDER — TRIAMCINOLONE ACETONIDE 40 MG/ML IJ SUSP: 40.0000 mg | Freq: Once | INTRAMUSCULAR | Status: DC

## 2015-12-09 MED ORDER — ROPIVACAINE HCL 2 MG/ML IJ SOLN
INTRAMUSCULAR | Status: AC
  Administered 2015-12-09: 13:00:00
  Filled 2015-12-09: qty 10

## 2015-12-09 MED ORDER — MIDAZOLAM HCL 5 MG/5ML IJ SOLN
INTRAMUSCULAR | Status: AC
  Administered 2015-12-09: 1 mg via INTRAVENOUS
  Filled 2015-12-09: qty 5

## 2015-12-09 MED ORDER — MIDAZOLAM HCL 5 MG/5ML IJ SOLN: 5.0000 mg | INTRAMUSCULAR | Status: DC

## 2015-12-09 MED ORDER — FENTANYL CITRATE (PF) 100 MCG/2ML IJ SOLN: 100.0000 ug | INTRAMUSCULAR | Status: DC

## 2015-12-09 MED ORDER — OXYCODONE-ACETAMINOPHEN 7.5-325 MG PO TABS: 1.0000 | ORAL_TABLET | Freq: Four times a day (QID) | ORAL | Status: DC | PRN

## 2015-12-09 MED ORDER — LACTATED RINGERS IV SOLN: 1000.0000 mL | INTRAVENOUS | Status: AC

## 2015-12-09 MED ORDER — ROPIVACAINE HCL 2 MG/ML IJ SOLN: 9.0000 mL | Freq: Once | INTRAMUSCULAR | Status: DC

## 2015-12-09 MED ORDER — FENTANYL CITRATE (PF) 100 MCG/2ML IJ SOLN
INTRAMUSCULAR | Status: AC
  Administered 2015-12-09: 100 ug
  Filled 2015-12-09: qty 2

## 2015-12-09 MED ORDER — TRIAMCINOLONE ACETONIDE 40 MG/ML IJ SUSP
INTRAMUSCULAR | Status: AC
  Administered 2015-12-09: 13:00:00
  Filled 2015-12-09: qty 1

## 2015-12-09 MED ORDER — LIDOCAINE HCL (PF) 1 % IJ SOLN: 10.0000 mL | Freq: Once | INTRAMUSCULAR | Status: DC

## 2015-12-09 NOTE — Patient Instructions (Signed)
Pain Management Discharge Instructions  General Discharge Instructions :  If you need to reach your doctor call: Monday-Friday 8:00 am - 4:00 pm at [phone] or toll free [phone].  After clinic hours [phone] to have operator reach doctor.  Bring all of your medication bottles to all your appointments in the pain clinic.  To cancel or reschedule your appointment with Pain Management please remember to call 24 hours in advance to avoid a fee.  Refer to the educational materials which you have been given on: General Risks, I had my Procedure. Discharge Instructions, Post Sedation.  Post Procedure Instructions:  The drugs you were given will stay in your system until tomorrow, so for the next 24 hours you should not drive, make any legal decisions or drink any alcoholic beverages.  You may eat anything you prefer, but it is better to start with liquids then soups and crackers, and gradually work up to solid foods.  Please notify your doctor immediately if you have any unusual bleeding, trouble breathing or pain that is not related to your normal pain.  Depending on the type of procedure that was done, some parts of your body may feel week and/or numb.  This usually clears up by tonight or the next day.  Walk with the use of an assistive device or accompanied by an adult for the 24 hours.  You may use ice on the affected area for the first 24 hours.  Put ice in a Ziploc bag and cover with a towel and place against area 15 minutes on 15 minutes off.  You may switch to heat after 24 hours.Radiofrequency Lesioning, Care After Refer to this sheet in the next few weeks. These instructions provide you with information about caring for yourself after your procedure. Your health care provider may also give you more specific instructions. Your treatment has been planned according to current medical practices, but problems sometimes occur. Call your health care provider if you have any  problems or questions after your procedure. WHAT TO EXPECT AFTER THE PROCEDURE After your procedure, it is common to have:  Pain from the burned nerve.  Temporary numbness. HOME CARE INSTRUCTIONS  Take over-the-counter and prescription medicines only as told by your health care provider.  Return to your normal activities as told by your health care provider. Ask your health care provider what activities are safe for you.  Pay close attention to how you feel after the procedure. If you start to have pain, write down when it hurts and how it feels. This will help you and your health care provider to know if you need an additional treatment.  Check your needle insertion site every day for signs of infection. Watch for:  Redness, swelling, or pain.  Fluid, blood, or pus.  Keep all follow-up visits as told by your health care provider. This is important. SEEK MEDICAL CARE IF:  Your pain does not get better.  You have redness, swelling, or pain at the needle insertion site.  You have fluid, blood, or pus coming from the needle insertion site.  You have a fever. SEEK IMMEDIATE MEDICAL CARE IF:  You develop sudden, severe pain.  You develop numbness or tingling near the procedure site that does not go away.   This information is not intended to replace advice given to you by your health care provider. Make sure you discuss any questions you have with your health care provider.   Document Released: 05/05/2011 Document Revised: 05/28/2015 Document Reviewed: 10/14/2014   [employer]vier Interactive Patient Education 2016 [employer] Radiofrequency Lesioning Radiofrequency lesioning is a procedure that is performed to relieve pain. The procedure is often used for back, neck, or arm pain. Radiofrequency lesioning involves the use of a machine that creates radio waves to make heat. During the procedure, the heat is applied to the nerve that carries the pain signal. The heat damages the nerve  and interferes with the pain signal. Pain relief usually lasts for 6 months to 1 year. LET YOUR HEALTH CARE PROVIDER KNOW ABOUT:  Any allergies you have.  All medicines you are taking, including vitamins, herbs, eye drops, creams, and over-the-counter medicines.  Previous problems you or members of your family have had with the use of anesthetics.  Any blood disorders you have.  Previous surgeries you have had.  Any medical conditions you have.  Whether you are pregnant or may be pregnant. RISKS AND COMPLICATIONS Generally, this is a safe procedure. However, problems may occur, including:  Pain or soreness at the injection site.  Infection at the injection site.  Damage to nerves or blood vessels. BEFORE THE PROCEDURE  Ask your health care provider about:  Changing or stopping your regular medicines. This is especially important if you are taking diabetes medicines or blood thinners.  Taking medicines such as aspirin and ibuprofen. These medicines can thin your blood. Do not take these medicines before your procedure if your health care provider instructs you not to.  Follow instructions from your health care provider about eating or drinking restrictions.  Plan to have someone take you home after the procedure.  If you go home right after the procedure, plan to have someone with you for 24 hours. PROCEDURE  You will be given one or more of the following:  A medicine to help you relax (sedative).  A medicine to numb the area (local anesthetic).  You will be awake during the procedure. You will need to be able to talk with the health care provider during the procedure.  With the help of a type of X-ray (fluoroscopy), the health care provider will insert a radiofrequency needle into the area to be treated.  Next, a wire that carries the radio waves (electrode) will be put through the radiofrequency needle. An electrical pulse will be sent through the electrode to verify  the correct nerve. You will feel a tingling sensation, and you may have muscle twitching.  Then, the tissue that is around the needle tip will be heated by an electric current that is passed using the radiofrequency machine. This will numb the nerves.  A bandage (dressing) will be put on the insertion area after the procedure is done. The procedure may vary among health care providers and hospitals. AFTER THE PROCEDURE  Your blood pressure, heart rate, breathing rate, and blood oxygen level will be monitored often until the medicines you were given have worn off.  Return to your normal activities as directed by your health care provider.   This information is not intended to replace advice given to you by your health care provider. Make sure you discuss any questions you have with your health care provider.   Document Released: 05/05/2011 Document Revised: 05/28/2015 Document Reviewed: 10/14/2014 Elsevier Interactive Patient Education 2016 [employer] GENERAL RISKS AND COMPLICATIONS  What are the risk, side effects and possible complications? Generally speaking, most procedures are safe.  However, with any procedure there are risks, side effects, and the possibility of complications.  The risks and complications are dependent   upon the sites that are lesioned, or the type of nerve block to be performed.  The closer the procedure is to the spine, the more serious the risks are.  Great care is taken when placing the radio frequency needles, block needles or lesioning probes, but sometimes complications can occur.  Infection: Any time there is an injection through the skin, there is a risk of infection.  This is why sterile conditions are used for these blocks.  There are four possible types of infection.  Localized skin infection.  Central Nervous System Infection-This can be in the form of Meningitis, which can be deadly.  Epidural Infections-This can be in the form of an epidural  abscess, which can cause pressure inside of the spine, causing compression of the spinal cord with subsequent paralysis. This would require an emergency surgery to decompress, and there are no guarantees that the patient would recover from the paralysis.  Discitis-This is an infection of the intervertebral discs.  It occurs in about 1% of discography procedures.  It is difficult to treat and it may lead to surgery.        2. Pain: the needles have to go through skin and soft tissues, will cause soreness.       3. Damage to internal structures:  The nerves to be lesioned may be near blood vessels or    other nerves which can be potentially damaged.       4. Bleeding: Bleeding is more common if the patient is taking blood thinners such as  aspirin, Coumadin, Ticiid, Plavix, etc., or if he/she have some genetic predisposition  such as hemophilia. Bleeding into the spinal canal can cause compression of the spinal  cord with subsequent paralysis.  This would require an emergency surgery to  decompress and there are no guarantees that the patient would recover from the  paralysis.       5. Pneumothorax:  Puncturing of a lung is a possibility, every time a needle is introduced in  the area of the chest or upper back.  Pneumothorax refers to free air around the  collapsed lung(s), inside of the thoracic cavity (chest cavity).  Another two possible  complications related to a similar event would include: Hemothorax and Chylothorax.   These are variations of the Pneumothorax, where instead of air around the collapsed  lung(s), you may have blood or chyle, respectively.       6. Spinal headaches: They may occur with any procedures in the area of the spine.       7. Persistent CSF (Cerebro-Spinal Fluid) leakage: This is a rare problem, but may occur  with prolonged intrathecal or epidural catheters either due to the formation of a fistulous  track or a dural tear.       8. Nerve damage: By working so close to the  spinal cord, there is always a possibility of  nerve damage, which could be as serious as a permanent spinal cord injury with  paralysis.       9. Death:  Although rare, severe deadly allergic reactions known as "Anaphylactic  reaction" can occur to any of the medications used.      10. Worsening of the symptoms:  We can always make thing worse.  What are the chances of something like this happening? Chances of any of this occuring are extremely low.  By statistics, you have more of a chance of getting killed in a motor vehicle accident: while driving to the hospital   than any of the above occurring .  Nevertheless, you should be aware that they are possibilities.  In general, it is similar to taking a shower.  Everybody knows that you can slip, hit your head and get killed.  Does that mean that you should not shower again?  Nevertheless always keep in mind that statistics do not mean anything if you happen to be on the wrong side of them.  Even if a procedure has a 1 (one) in a 1,000,000 (million) chance of going wrong, it you happen to be that one..Also, keep in mind that by statistics, you have more of a chance of having something go wrong when taking medications.  Who should not have this procedure? If you are on a blood thinning medication (e.g. Coumadin, Plavix, see list of "Blood Thinners"), or if you have an active infection going on, you should not have the procedure.  If you are taking any blood thinners, please inform your physician.  How should I prepare for this procedure?  Do not eat or drink anything at least six hours prior to the procedure.  Bring a driver with you .  It cannot be a taxi.  Come accompanied by an adult that can drive you back, and that is strong enough to help you if your legs get weak or numb from the local anesthetic.  Take all of your medicines the morning of the procedure with just enough water to swallow them.  If you have diabetes, make sure that you are  scheduled to have your procedure done first thing in the morning, whenever possible.  If you have diabetes, take only half of your insulin dose and notify our nurse that you have done so as soon as you arrive at the clinic.  If you are diabetic, but only take blood sugar pills (oral hypoglycemic), then do not take them on the morning of your procedure.  You may take them after you have had the procedure.  Do not take aspirin or any aspirin-containing medications, at least eleven (11) days prior to the procedure.  They may prolong bleeding.  Wear loose fitting clothing that may be easy to take off and that you would not mind if it got stained with Betadine or blood.  Do not wear any jewelry or perfume  Remove any nail coloring.  It will interfere with some of our monitoring equipment.  NOTE: Remember that this is not meant to be interpreted as a complete list of all possible complications.  Unforeseen problems may occur.  BLOOD THINNERS The following drugs contain aspirin or other products, which can cause increased bleeding during surgery and should not be taken for 2 weeks prior to and 1 week after surgery.  If you should need take something for relief of minor pain, you may take acetaminophen which is found in Tylenol,m Datril, Anacin-3 and Panadol. It is not blood thinner. The products listed below are.  Do not take any of the products listed below in addition to any listed on your instruction sheet.  A.P.C or A.P.C with Codeine Codeine Phosphate Capsules #3 Ibuprofen Ridaura  ABC compound Congesprin Imuran rimadil  Advil Cope Indocin Robaxisal  Alka-Seltzer Effervescent Pain Reliever and Antacid Coricidin or Coricidin-D  Indomethacin Rufen  Alka-Seltzer plus Cold Medicine Cosprin Ketoprofen S-A-C Tablets  Anacin Analgesic Tablets or Capsules Coumadin Korlgesic Salflex  Anacin Extra Strength Analgesic tablets or capsules CP-2 Tablets Lanoril Salicylate  Anaprox Cuprimine Capsules  Levenox Salocol  Anexsia-D Dalteparin Magan Salsalate    Anodynos Darvon compound Magnesium Salicylate Sine-off  Ansaid Dasin Capsules Magsal Sodium Salicylate  Anturane Depen Capsules Marnal Soma  APF Arthritis pain formula Dewitt's Pills Measurin Stanback  Argesic Dia-Gesic Meclofenamic Sulfinpyrazone  Arthritis Bayer Timed Release Aspirin Diclofenac Meclomen Sulindac  Arthritis pain formula Anacin Dicumarol Medipren Supac  Analgesic (Safety coated) Arthralgen Diffunasal Mefanamic Suprofen  Arthritis Strength Bufferin Dihydrocodeine Mepro Compound Suprol  Arthropan liquid Dopirydamole Methcarbomol with Aspirin Synalgos  ASA tablets/Enseals Disalcid Micrainin Tagament  Ascriptin Doan's Midol Talwin  Ascriptin A/D Dolene Mobidin Tanderil  Ascriptin Extra Strength Dolobid Moblgesic Ticlid  Ascriptin with Codeine Doloprin or Doloprin with Codeine Momentum Tolectin  Asperbuf Duoprin Mono-gesic Trendar  Aspergum Duradyne Motrin or Motrin IB Triminicin  Aspirin plain, buffered or enteric coated Durasal Myochrisine Trigesic  Aspirin Suppositories Easprin Nalfon Trillsate  Aspirin with Codeine Ecotrin Regular or Extra Strength Naprosyn Uracel  Atromid-S Efficin Naproxen Ursinus  Auranofin Capsules Elmiron Neocylate Vanquish  Axotal Emagrin Norgesic Verin  Azathioprine Empirin or Empirin with Codeine Normiflo Vitamin E  Azolid Emprazil Nuprin Voltaren  Bayer Aspirin plain, buffered or children's or timed BC Tablets or powders Encaprin Orgaran Warfarin Sodium  Buff-a-Comp Enoxaparin Orudis Zorpin  Buff-a-Comp with Codeine Equegesic Os-Cal-Gesic   Buffaprin Excedrin plain, buffered or Extra Strength Oxalid   Bufferin Arthritis Strength Feldene Oxphenbutazone   Bufferin plain or Extra Strength Feldene Capsules Oxycodone with Aspirin   Bufferin with Codeine Fenoprofen Fenoprofen Pabalate or Pabalate-SF   Buffets II Flogesic Panagesic   Buffinol plain or Extra Strength Florinal or Florinal with  Codeine Panwarfarin   Buf-Tabs Flurbiprofen Penicillamine   Butalbital Compound Four-way cold tablets Penicillin   Butazolidin Fragmin Pepto-Bismol   Carbenicillin Geminisyn Percodan   Carna Arthritis Reliever Geopen Persantine   Carprofen Gold's salt Persistin   Chloramphenicol Goody's Phenylbutazone   Chloromycetin Haltrain Piroxlcam   Clmetidine heparin Plaquenil   Cllnoril Hyco-pap Ponstel   Clofibrate Hydroxy chloroquine Propoxyphen         Before stopping any of these medications, be sure to consult the physician who ordered them.  Some, such as Coumadin (Warfarin) are ordered to prevent or treat serious conditions such as "deep thrombosis", "pumonary embolisms", and other heart problems.  The amount of time that you may need off of the medication may also vary with the medication and the reason for which you were taking it.  If you are taking any of these medications, please make sure you notify your pain physician before you undergo any procedures.          

## 2015-12-09 NOTE — Progress Notes (Signed)
Safety precautions to be maintained throughout the outpatient stay will include: orient to surroundings, keep bed in low position, maintain call bell within reach at all times, provide assistance with transfer out of bed and ambulation.  

## 2015-12-09 NOTE — Progress Notes (Signed)
Patient's Name: James Friedman MRN: [id number] DOB: 20-Jan-1946 DOS: 12/09/2015  Primary Reason(s) for Visit: Interventional Pain Management Treatment. CC: Back Pain   Pre-Procedure Assessment:  James Friedman is a 70 y.o. year old, male patient, seen today for interventional treatment. He has Hyperlipidemia; Hypertension; Chest pain; Chronic pain; Pain in shoulder (Bilateral) (L>R); Non-traumatic rotator cuff tear; Chronic pain syndrome; Chronic low back pain (Location of Primary Source of Pain) (Bilateral) (L>R); Lumbar spondylosis; Lumbosacral radiculopathy at S1 (Right side); Lumbar facet syndrome (Bilateral) (L>R); Chronic neck pain (Bilateral) (R>L); Chronic cervical radicular pain (Bilateral) (R>L); Cervical spondylosis; Morbid obesity ([place]); Long term current use of opiate analgesic; Long term prescription opiate use; Opiate use (105 MME/Day); Opiate dependence ([place]); Chronic sacroiliac joint pain (Bilateral) (R>L); Failed neck surgery syndrome; Chronic upper extremity pain (Bilateral) (R>L); History of bilateral knee surgery; Diffuse myofascial pain syndrome; Lumbar facet hypertrophy (L4-5 and L5-S1); Carpal tunnel syndrome on right (Released); Transient weakness of left lower extremity; At high risk for falls; Lumbar spinal stenosis (L3-4); 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); Grade 1 Anterolisthesis of L4 over L5; Lumbar disc herniation (Left L4-5) (Right L5-S1); Chronic lower extremity pain (Location of Secondary source of pain) (Bilateral) (R>L); Essential hypertension; Diabetes mellitus without complication ([place]); Atypical chest pain; Acute pain of right shoulder; Acute pain of right knee; Chronic knee pain (Bilateral); Diabetic peripheral neuropathy ([place]); Chronic lumbar radicular pain (Location of Secondary source of pain) (Bilateral) (R>L) (S1 Dermatome on the left); Abnormal EMG/PNCV (11/26/2015); and Postoperative pain (post-radiofrequency) on his problem list.. His primarily concern  today is the Back Pain   Today's Initial Pain Score: 8/10 Reported level of pain is incompatible with clinical obrservations. This may be secondary to a possible lack of understanding on how the pain scale works. Pain Type: Chronic pain Pain Location: Back Pain Orientation: Left Pain Descriptors / Indicators: Aching, Constant, Burning Pain Frequency: Constant  Post-procedure Pain Score: 0-No pain  Date of Last Visit: 11/25/15 Service Provided on Last Visit: Procedure (right shoulder)  Verification of the correct person, correct site (including marking of site), and correct procedure were performed and confirmed by the patient.  Today's Vitals   12/09/15 1315 12/09/15 1325 12/09/15 1335 12/09/15 1339  BP: 122/99 129/78 166/74   Pulse: 86 82 87   Temp:   97.8 F (36.6 C)   Resp: '16 16 16   ' Height:      Weight:      SpO2: 97% 98% 98%   PainSc:    0-No pain  PainLoc:      Calculated BMI: Body mass index is 43.1 kg/(m^2). Allergies: He has No Known Allergies.. Primary Diagnosis: Facet syndrome, lumbar [M54.5]  Procedure:  Type: Therapeutic Medial Branch Facet Radiofrequency Ablation Region: Lumbar Level: L2, L3, L4, L5, & S1 Medial Branch Level(s) Laterality: Left-Sided  Indications: 1. Lumbar facet syndrome (Bilateral)   2. Lumbar facet hypertrophy (L4-5 and L5-S1)   3. Lumbar spondylosis, unspecified spinal osteoarthritis   4. Grade 1 Anterolisthesis of L4 over L5   5. Chronic low back pain (Location of Primary Source of Pain) (Bilateral) (L>R)   6. Postoperative pain (post-radiofrequency)     The patient has failed to respond to conservative therapies including over-the-counter medications, anti-inflammatories, muscle relaxants, membrane stabilizers, opioids, physical therapy, modalities such as heat and ice, as well as more invasive techniques such as nerve blocks. The patient did attained more than 50% relief of the pain from a series of diagnostic injections  conducted in separate occasions.  In  addition, James Friedman has Chronic pain; Pain in shoulder (Bilateral) (L>R); Non-traumatic rotator cuff tear; Chronic pain syndrome; Chronic low back pain (Location of Primary Source of Pain) (Bilateral) (L>R); Lumbar spondylosis; Lumbosacral radiculopathy at S1 (Right side); Lumbar facet syndrome (Bilateral) (L>R); Chronic neck pain (Bilateral) (R>L); Chronic cervical radicular pain (Bilateral) (R>L); Cervical spondylosis; Chronic sacroiliac joint pain (Bilateral) (R>L); Failed neck surgery syndrome; Chronic upper extremity pain (Bilateral) (R>L); Diffuse myofascial pain syndrome; Lumbar facet hypertrophy (L4-5 and L5-S1); Transient weakness of left lower extremity; Lumbar spinal stenosis (L3-4); 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  Consent: Secured. Under the influence of no sedatives a written informed consent was obtained, after having provided information on the risks and possible complications. To fulfill our ethical and legal obligations, as recommended by the American Medical Association's Code of Ethics, we have provided information to the patient about our clinical impression; the nature and purpose of the treatment or procedure; the risks, benefits, and possible complications of the intervention; alternatives; the risk(s) and benefit(s) of the alternative treatment(s) or procedure(s); and the risk(s) and benefit(s) of doing nothing. The  patient was provided information about the risks and possible complications associated with the procedure. In the case of spinal procedures these may include, but are not limited to, failure to achieve desired goals, infection, bleeding, organ or nerve damage, allergic reactions, paralysis, and death. In addition, the patient was informed that Medicine is not an exact science; therefore, there is also the possibility of unforeseen risks and possible complications that may result in a catastrophic outcome. The patient indicated having understood very clearly. We have given the patient no guarantees and we have made no promises. Enough time was given to the patient to ask questions, all of which were answered to the patient's satisfaction.  Pre-Procedure Preparation: Safety Precautions: Allergies reviewed. Appropriate site, procedure, and patient were confirmed by following the Joint Commission's Universal Protocol (UP.01.01.01), in the form of a "Time Out". The patient was asked to confirm marked site and procedure, before commencing. The patient was asked about blood thinners, or active infections, both of which were denied. Patient was assessed for positional comfort and all pressure points were checked before starting procedure. Monitoring:  As per clinic protocol. Infection Control Precautions: Sterile technique used. Standard Universal Precautions were taken as recommended by the Department of [facility] for Disease Control and Prevention (CDC). Standard pre-surgical skin prep was conducted. Respiratory hygiene and cough etiquette was practiced. Hand hygiene observed. Safe injection practices and needle disposal techniques followed. SDV (single dose vial) medications used. Medications properly checked for expiration dates and contaminants. Personal protective equipment (PPE) used: Sterile double glove technique. Radiation resistant gloves. Sterile surgical gloves.  Anesthesia, Analgesia,  Anxiolysis: Type: Moderate (Conscious) Sedation & Local Anesthesia Local Anesthetic: Lidocaine 1% Route: Intravenous (IV) IV Access: Secured Sedation: Meaningful verbal contact was maintained at all times during the procedure  Indication(s): Analgesia & Anxiolysis  Description of Procedure Process:  Time-out: "Time-out" completed before starting procedure, as per protocol. Position: Prone Target Area: For Lumbar Facet blocks, the target is the groove  formed by the junction of the transverse process and superior articular process. For the L5 dorsal ramus, the target is the notch between superior articular process and sacral ala. For the S1 dorsal ramus, the target is the superior and lateral edge of the posterior S1 Sacral foramen. Approach: Paraspinal approach. Area Prepped: Entire Posterior Lumbosacral Region Prepping solution: ChloraPrep (2% chlorhexidine gluconate and 70% isopropyl alcohol) Safety Precautions: Aspiration looking for blood return was conducted prior to all injections. At no point did we inject any substances, as a needle was being advanced. No attempts were made at seeking any paresthesias. Safe injection practices and needle disposal techniques used. Medications properly checked for expiration dates. SDV (single dose vial) medications used.   Description of the Procedure: Protocol guidelines were followed. The patient was placed in position over the fluoroscopy table. The target area was identified and the area prepped in the usual manner. Skin desensitized using vapocoolant spray. Skin & deeper tissues infiltrated with local anesthetic. Appropriate amount of time allowed to pass for local anesthetics to take effect. Radiofrequency needles were introduced to the area of the medial branch at the junction of the superior articular process and transverse process using fluoroscopy. Using the [employer], sensory stimulation using 50 Hz was used to locate &  identify the nerve, making sure that the needle was positioned such that there was no sensory stimulation below 0.3 V or above 0.7 V. Stimulation using 2 Hz was used to evaluate the motor component. Care was taken not to lesion any nerves that demonstrated motor stimulation of the lower extremities at an output of less than 2.5 times that of the sensory threshold, or a maximum of 2.0 V. Once satisfactory placement of the needles was achieved, the above solution was slowly injected after negative aspiration. After waiting for at least 2 minutes, the ablation was performed at 80 degrees C for 60 seconds.The needles were then removed and the area cleansed, making sure to leave some of the prepping solution back to take advantage of its long term bactericidal properties. EBL: None Materials & Medications Used:  Needle(s) Used: Teflon-Coated Radiofrequency Needles Medications Administered today: We administered ropivacaine (PF) 2 mg/ml (0.2%), fentaNYL, triamcinolone acetonide, and midazolam.Please see chart orders for dosing details.  Imaging Guidance:  Type of Imaging Technique: Fluoroscopy Guidance (Spinal) Indication(s): Assistance in needle guidance and placement for procedures requiring needle placement in or near specific anatomical locations not easily accessible without such assistance. Exposure Time: Please see nurses notes. Contrast: None required. Fluoroscopic Guidance: I was personally present in the fluoroscopy suite, where the patient was placed in position for the procedure, over the fluoroscopy-compatible table. Fluoroscopy was manipulated, using "Tunnel Vision Technique", to obtain the best possible view of the target area, on the affected side. Parallax error was corrected before commencing the procedure. A "direction-depth-direction" technique was used to introduce the needle under continuous pulsed fluoroscopic guidance. Once the target was reached, antero-posterior, oblique, and lateral  fluoroscopic projection views were taken to confirm needle placement in all planes. Permanently recorded images stored by scanning into EMR. Interpretation: Intraoperative imaging interpretation by performing Physician. Adequate needle placement confirmed.  Antibiotic Prophylaxis:  Indication(s): No indications identified. Type:  Antibiotics Given (last 72 hours)    None       Post-operative Assessment:  Complications: No immediate post-treatment complications were observed Disposition: Return to clinic for follow-up evaluation. The patient tolerated the entire procedure well. A repeat set of vitals were taken after the procedure and the patient  was kept under observation following institutional policy, for this procedure. Post-procedural neurological assessment was performed, showing return to baseline, prior to discharge. The patient was discharged home, once institutional criteria were met. The patient was provided with post-procedure discharge instructions, including a section on how to identify potential problems. Should any problems arise concerning this procedure, the patient was given instructions to immediately contact us, at any time, without hesitation. In any case, we plan to contact the patient by telephone for a follow-up status report regarding this interventional procedure. Comments:  No additional relevant information  Medications administered during this visit: We administered ropivacaine (PF) 2 mg/ml (0.2%), fentaNYL, triamcinolone acetonide, and midazolam.  Prescriptions ordered during this visit: New Prescriptions   OXYCODONE-ACETAMINOPHEN (PERCOCET) 7.5-325 MG TABLET    Take 1 tablet by mouth every 6 (six) hours as needed for moderate pain or severe pain (For post-radiofrequency pain only.).    No future appointments.  Primary Care Physician: Pcp Not In System Location: [facility] Note by: Odysseus Cada A. Dossie Arbour, M.D, DABA, DABAPM, DABPM,  DABIPP, FIPP  Disclaimer:  Medicine is not an exact science. The only guarantee in medicine is that nothing is guaranteed. It is important to note that the decision to proceed with this intervention was based on the information collected from the patient. The Data and conclusions were drawn from the patient's questionnaire, the interview, and the physical examination. Because the information was provided in large part by the patient, it cannot be guaranteed that it has not been purposely or unconsciously manipulated. Every effort has been made to obtain as much relevant data as possible for this evaluation. It is important to note that the conclusions that lead to this procedure are derived in large part from the available data. Always take into account that the treatment will also be dependent on availability of resources and existing treatment guidelines, considered by other Pain Management Practitioners as being common knowledge and practice, at the time of the intervention. For Medico-Legal purposes, it is also important to point out that variation in procedural techniques and pharmacological choices are the acceptable norm. The indications, contraindications, technique, and results of the above procedure should only be interpreted and judged by a Board-Certified Interventional Pain Specialist with extensive familiarity and expertise in the same exact procedure and technique. Attempts at providing opinions without similar or greater experience and expertise than that of the treating physician will be considered as inappropriate and unethical, and shall result in a formal complaint to the state medical board and applicable specialty societies.

## 2015-12-10 ENCOUNTER — Telehealth: Payer: Self-pay | Admitting: *Deleted

## 2015-12-10 NOTE — Telephone Encounter (Signed)
Message left

## 2016-01-29 DIAGNOSIS — G629 Polyneuropathy, unspecified: Secondary | ICD-10-CM | POA: Insufficient documentation

## 2016-02-24 ENCOUNTER — Telehealth: Payer: Self-pay

## 2016-02-24 NOTE — Telephone Encounter (Signed)
Dr. Purcell Moutoneilly wants to talk to Dr. Laban EmperorNaveira she is [name] new PCP and would just like to speak to him

## 2016-03-03 ENCOUNTER — Telehealth: Payer: Self-pay

## 2016-03-03 NOTE — Telephone Encounter (Signed)
02-24-16, message given to Dr Laban EmperorNaveira to call Dr Purcell Moutoneilly

## 2016-04-14 ENCOUNTER — Telehealth: Payer: Self-pay | Admitting: Pain Medicine

## 2016-04-14 NOTE — Telephone Encounter (Signed)
Dr. Lajuana Matte would like to speak with Dr. Laban Emperor about patient care for James Friedman  [phone]

## 2016-04-15 ENCOUNTER — Encounter: Payer: Self-pay | Admitting: *Deleted

## 2016-04-16 NOTE — Discharge Instructions (Signed)

## 2016-04-19 ENCOUNTER — Ambulatory Visit: Payer: Medicare (Managed Care) | Admitting: Student in an Organized Health Care Education/Training Program

## 2016-04-19 ENCOUNTER — Ambulatory Visit
Admission: RE | Admit: 2016-04-19 | Discharge: 2016-04-19 | Disposition: A | Discharge status: Home / Self Care | Payer: Medicare (Managed Care) | Source: Ambulatory Visit | Attending: Ophthalmology | Admitting: Ophthalmology

## 2016-04-19 ENCOUNTER — Encounter: Payer: Self-pay | Admitting: Anesthesiology

## 2016-04-19 ENCOUNTER — Encounter
Admission: RE | Disposition: A | Discharge status: Home / Self Care | Payer: Self-pay | Source: Ambulatory Visit | Attending: Ophthalmology

## 2016-04-19 DIAGNOSIS — Z6841 Body Mass Index (BMI) 40.0 and over, adult: Secondary | ICD-10-CM | POA: Insufficient documentation

## 2016-04-19 DIAGNOSIS — G4730 Sleep apnea, unspecified: Secondary | ICD-10-CM | POA: Insufficient documentation

## 2016-04-19 DIAGNOSIS — M25473 Effusion, unspecified ankle: Secondary | ICD-10-CM | POA: Insufficient documentation

## 2016-04-19 DIAGNOSIS — E1136 Type 2 diabetes mellitus with diabetic cataract: Secondary | ICD-10-CM | POA: Diagnosis not present

## 2016-04-19 DIAGNOSIS — H2511 Age-related nuclear cataract, right eye: Secondary | ICD-10-CM | POA: Diagnosis present

## 2016-04-19 DIAGNOSIS — E1140 Type 2 diabetes mellitus with diabetic neuropathy, unspecified: Secondary | ICD-10-CM | POA: Insufficient documentation

## 2016-04-19 DIAGNOSIS — G8929 Other chronic pain: Secondary | ICD-10-CM | POA: Diagnosis not present

## 2016-04-19 DIAGNOSIS — M549 Dorsalgia, unspecified: Secondary | ICD-10-CM | POA: Insufficient documentation

## 2016-04-19 DIAGNOSIS — J45909 Unspecified asthma, uncomplicated: Secondary | ICD-10-CM | POA: Diagnosis not present

## 2016-04-19 DIAGNOSIS — Z9842 Cataract extraction status, left eye: Secondary | ICD-10-CM | POA: Insufficient documentation

## 2016-04-19 DIAGNOSIS — K219 Gastro-esophageal reflux disease without esophagitis: Secondary | ICD-10-CM | POA: Diagnosis not present

## 2016-04-19 DIAGNOSIS — Z9981 Dependence on supplemental oxygen: Secondary | ICD-10-CM | POA: Insufficient documentation

## 2016-04-19 DIAGNOSIS — Z87891 Personal history of nicotine dependence: Secondary | ICD-10-CM | POA: Diagnosis not present

## 2016-04-19 DIAGNOSIS — E7800 Pure hypercholesterolemia, unspecified: Secondary | ICD-10-CM | POA: Insufficient documentation

## 2016-04-19 DIAGNOSIS — F419 Anxiety disorder, unspecified: Secondary | ICD-10-CM | POA: Diagnosis not present

## 2016-04-19 DIAGNOSIS — M1990 Unspecified osteoarthritis, unspecified site: Secondary | ICD-10-CM | POA: Insufficient documentation

## 2016-04-19 DIAGNOSIS — I10 Essential (primary) hypertension: Secondary | ICD-10-CM | POA: Insufficient documentation

## 2016-04-19 DIAGNOSIS — G2581 Restless legs syndrome: Secondary | ICD-10-CM | POA: Insufficient documentation

## 2016-04-19 DIAGNOSIS — H9190 Unspecified hearing loss, unspecified ear: Secondary | ICD-10-CM | POA: Insufficient documentation

## 2016-04-19 HISTORY — DX: Gastro-esophageal reflux disease without esophagitis: K21.9

## 2016-04-19 HISTORY — PX: CATARACT EXTRACTION W/PHACO: SHX586

## 2016-04-19 HISTORY — DX: Dorsalgia, unspecified: M54.9

## 2016-04-19 LAB — GLUCOSE, CAPILLARY
GLUCOSE-CAPILLARY: 156 mg/dL — AB (ref 65–99)
Glucose-Capillary: 131 mg/dL — ABNORMAL HIGH (ref 65–99)

## 2016-04-19 SURGERY — PHACOEMULSIFICATION, CATARACT, WITH IOL INSERTION
Anesthesia: Monitor Anesthesia Care | Laterality: Right | Wound class: Clean

## 2016-04-19 MED ORDER — ARMC OPHTHALMIC DILATING GEL
1.0000 "application " | OPHTHALMIC | Status: DC | PRN
  Administered 2016-04-19 (×2): 1 via OPHTHALMIC

## 2016-04-19 MED ORDER — BRIMONIDINE TARTRATE 0.2 % OP SOLN
OPHTHALMIC | Status: DC | PRN
  Administered 2016-04-19: 1 [drp] via OPHTHALMIC

## 2016-04-19 MED ORDER — LACTATED RINGERS IV SOLN: INTRAVENOUS | Status: DC

## 2016-04-19 MED ORDER — TIMOLOL MALEATE 0.5 % OP SOLN
OPHTHALMIC | Status: DC | PRN
  Administered 2016-04-19: 1 [drp] via OPHTHALMIC

## 2016-04-19 MED ORDER — MIDAZOLAM HCL 2 MG/2ML IJ SOLN
INTRAMUSCULAR | Status: DC | PRN
  Administered 2016-04-19: 1 mg via INTRAVENOUS

## 2016-04-19 MED ORDER — NA HYALUR & NA CHOND-NA HYALUR 0.4-0.35 ML IO KIT
PACK | INTRAOCULAR | Status: DC | PRN
  Administered 2016-04-19: 1 mL via INTRAOCULAR

## 2016-04-19 MED ORDER — FENTANYL CITRATE (PF) 100 MCG/2ML IJ SOLN
INTRAMUSCULAR | Status: DC | PRN
  Administered 2016-04-19: 50 ug via INTRAVENOUS

## 2016-04-19 MED ORDER — LIDOCAINE HCL (PF) 4 % IJ SOLN
INTRAMUSCULAR | Status: DC | PRN
  Administered 2016-04-19: 1 mL via OPHTHALMIC

## 2016-04-19 MED ORDER — EPINEPHRINE HCL 1 MG/ML IJ SOLN
INTRAOCULAR | Status: DC | PRN
  Administered 2016-04-19: 75 mL via OPHTHALMIC

## 2016-04-19 MED ORDER — CEFUROXIME OPHTHALMIC INJECTION 1 MG/0.1 ML
INJECTION | OPHTHALMIC | Status: DC | PRN
  Administered 2016-04-19: 0.1 mL via INTRACAMERAL

## 2016-04-19 MED ORDER — TETRACAINE HCL 0.5 % OP SOLN
1.0000 [drp] | OPHTHALMIC | Status: DC | PRN
  Administered 2016-04-19: 1 [drp] via OPHTHALMIC

## 2016-04-19 MED ORDER — POVIDONE-IODINE 5 % OP SOLN
1.0000 "application " | OPHTHALMIC | Status: DC | PRN
  Administered 2016-04-19: 1 via OPHTHALMIC

## 2016-04-19 SURGICAL SUPPLY — 31 items
APL FBRTP 3 NS LF CTTN WD (MISCELLANEOUS) ×1
APPLICATOR COTTON TIP 3IN (MISCELLANEOUS) ×3 IMPLANT
CANNULA ANT/CHMB 27G (MISCELLANEOUS) ×1 IMPLANT
CANNULA ANT/CHMB 27GA (MISCELLANEOUS) ×3 IMPLANT
DISSECTOR HYDRO NUCLEUS 50X22 (MISCELLANEOUS) ×3 IMPLANT
GLOVE BIO SURGEON STRL SZ7 (GLOVE) ×3 IMPLANT
GLOVE SURG LX 6.5 MICRO (GLOVE) ×2
GLOVE SURG LX STRL 6.5 MICRO (GLOVE) ×1 IMPLANT
GOWN STRL REUS W/ TWL LRG LVL3 (GOWN DISPOSABLE) ×2 IMPLANT
GOWN STRL REUS W/TWL LRG LVL3 (GOWN DISPOSABLE) ×6
LENS IOL ACRYSOF IQ 21.0 (Intraocular Lens) ×2 IMPLANT
MARKER SKIN DUAL TIP RULER LAB (MISCELLANEOUS) ×3 IMPLANT
NDL FILTER BLUNT 18X1 1/2 (NEEDLE) ×1 IMPLANT
NEEDLE FILTER BLUNT 18X 1/2SAF (NEEDLE) ×2
NEEDLE FILTER BLUNT 18X1 1/2 (NEEDLE) ×1 IMPLANT
PACK CATARACT BRASINGTON (MISCELLANEOUS) ×3 IMPLANT
PACK EYE AFTER SURG (MISCELLANEOUS) ×3 IMPLANT
PACK OPTHALMIC (MISCELLANEOUS) ×3 IMPLANT
RING MALYGIN 7.0 (MISCELLANEOUS) IMPLANT
SOL BAL SALT 15ML (MISCELLANEOUS)
SOLUTION BAL SALT 15ML (MISCELLANEOUS) IMPLANT
SUT ETHILON 10-0 CS-B-6CS-B-6 (SUTURE)
SUT VICRYL  9 0 (SUTURE)
SUT VICRYL 9 0 (SUTURE) IMPLANT
SUTURE EHLN 10-0 CS-B-6CS-B-6 (SUTURE) IMPLANT
SYR 3ML LL SCALE MARK (SYRINGE) ×3 IMPLANT
SYR TB 1ML LUER SLIP (SYRINGE) ×3 IMPLANT
WATER STERILE IRR 250ML POUR (IV SOLUTION) ×3 IMPLANT
WATER STERILE IRR 500ML POUR (IV SOLUTION) IMPLANT
WICK EYE OCUCEL (MISCELLANEOUS) IMPLANT
WIPE NON LINTING 3.25X3.25 (MISCELLANEOUS) ×3 IMPLANT

## 2016-04-19 NOTE — Anesthesia Procedure Notes (Signed)
Procedure Name: MAC Performed by: Ercell Perlman Pre-anesthesia Checklist: Patient identified, Emergency Drugs available, Suction available, Patient being monitored and Timeout performed Patient Re-evaluated:Patient Re-evaluated prior to inductionOxygen Delivery Method: Nasal cannula Preoxygenation: Pre-oxygenation with 100% oxygen       

## 2016-04-19 NOTE — Op Note (Signed)
Date of Surgery: 04/19/2016  PREOPERATIVE DIAGNOSES: Visually significant nuclear sclerotic cataract, right eye.  POSTOPERATIVE DIAGNOSES: Same  PROCEDURES PERFORMED: Cataract extraction with intraocular lens implant, right eye.  SURGEON: Devin Going, M.D.  ANESTHESIA: MAC and topical  IMPLANTS: [id number] +21.0 D  Implant Name Type Inv. Item Serial No. Manufacturer Lot No. LRB No. Used  LENS IOL ACRYSOF IQ 21.0 - [id number] Intraocular Lens LENS IOL ACRYSOF IQ 21.0 [id number] [employer]   Right 1     COMPLICATIONS: None.  DESCRIPTION OF PROCEDURE: Therapeutic options were discussed with the patient preoperatively, including a discussion of risks and benefits of surgery. Informed consent was obtained. An IOL-Master and immersion biometry were used to take the lens measurements, and a dilated fundus exam was performed within 6 months of the surgical date.  The patient was premedicated and brought to the operating room and placed on the operating table in the supine position. After adequate anesthesia, the patient was prepped and draped in the usual sterile ophthalmic fashion. A wire lid speculum was inserted and the microscope was positioned. A Superblade was used to create a paracentesis site at the limbus and a small amount of dilute preservative free lidocaine was instilled into the anterior chamber, followed by dispersive viscoelastic. A clear corneal incision was created temporally using a 2.4 mm keratome blade. Capsulorrhexis was then performed. In situ phacoemulsification was performed.  Cortical material was removed with the irrigation-aspiration unit. Dispersive viscoelastic was instilled to open the capsular bag. A posterior chamber intraocular lens with the specifications above was inserted and positioned. Irrigation-aspiration was used to remove all viscoelastic. Cefuroxime 1cc was instilled into the anterior chamber, and the corneal incision was checked and found to be water tight.  The eyelid speculum was removed.  The operative eye was covered with protective goggles after instilling 1 drop of timolol and brimonidine. The patient tolerated the procedure well. There were no complications.

## 2016-04-19 NOTE — H&P (Signed)
H+P reviewed and is up to date, please see paper chart.  

## 2016-04-19 NOTE — Anesthesia Postprocedure Evaluation (Signed)
Anesthesia Post Note  Patient: James Friedman  Procedure(s) Performed: Procedure(s) (LRB): CATARACT EXTRACTION PHACO AND INTRAOCULAR LENS PLACEMENT (IOC) (Right)  Patient location during evaluation: PACU Anesthesia Type: MAC Level of consciousness: awake and alert Pain management: pain level controlled Vital Signs Assessment: post-procedure vital signs reviewed and stable Respiratory status: spontaneous breathing, nonlabored ventilation, respiratory function stable and patient connected to nasal cannula oxygen Cardiovascular status: stable and blood pressure returned to baseline Anesthetic complications: no    Davied Nocito

## 2016-04-19 NOTE — Transfer of Care (Signed)
Immediate Anesthesia Transfer of Care Note  Patient: James Friedman  Procedure(s) Performed: Procedure(s) with comments: CATARACT EXTRACTION PHACO AND INTRAOCULAR LENS PLACEMENT (IOC) (Right) - RIGHT DIABETIC - insulin and oral meds Sleep apnea  Patient Location: PACU  Anesthesia Type: MAC  Level of Consciousness: awake, alert  and patient cooperative  Airway and Oxygen Therapy: Patient Spontanous Breathing and Patient connected to supplemental oxygen  Post-op Assessment: Post-op Vital signs reviewed, Patient's Cardiovascular Status Stable, Respiratory Function Stable, Patent Airway and No signs of Nausea or vomiting  Post-op Vital Signs: Reviewed and stable  Complications: No apparent anesthesia complications

## 2016-04-19 NOTE — Anesthesia Preprocedure Evaluation (Signed)
Anesthesia Evaluation  Patient identified by MRN, date of birth, ID band Patient awake    Reviewed: NPO status   History of Anesthesia Complications Negative for: history of anesthetic complications  Airway Mallampati: II  TM Distance: >3 FB Neck ROM: Full    Dental  (+) Upper Dentures, Lower Dentures   Pulmonary asthma , sleep apnea and Oxygen sleep apnea , former smoker,    Pulmonary exam normal        Cardiovascular hypertension, Normal cardiovascular exam     Neuro/Psych Anxiety Neuropathy in lower legs;  Chronic back pain.  DDD;  negative psych ROS   GI/Hepatic Neg liver ROS, GERD  Controlled,  Endo/Other  diabetes, Well ControlledMorbid obesity  Renal/GU negative Renal ROS  negative genitourinary   Musculoskeletal  (+) Arthritis , Osteoarthritis,    Abdominal   Peds  Hematology negative hematology ROS (+)   Anesthesia Other Findings   Reproductive/Obstetrics                             Anesthesia Physical Anesthesia Plan  ASA: III  Anesthesia Plan: MAC   Post-op Pain Management:    Induction:   Airway Management Planned:   Additional Equipment:   Intra-op Plan:   Post-operative Plan:   Informed Consent: I have reviewed the patients History and Physical, chart, labs and discussed the procedure including the risks, benefits and alternatives for the proposed anesthesia with the patient or authorized representative who has indicated his/her understanding and acceptance.     Plan Discussed with: CRNA  Anesthesia Plan Comments:         Anesthesia Quick Evaluation

## 2016-05-20 ENCOUNTER — Ambulatory Visit: Payer: Medicare (Managed Care) | Admitting: Pain Medicine

## 2016-06-29 ENCOUNTER — Ambulatory Visit (HOSPITAL_BASED_OUTPATIENT_CLINIC_OR_DEPARTMENT_OTHER): Payer: Medicare (Managed Care) | Admitting: Pain Medicine

## 2016-06-29 VITALS — BP 158/82 | HR 70 | Resp 18 | Ht 69.0 in | Wt 292.0 lb

## 2016-06-29 DIAGNOSIS — G894 Chronic pain syndrome: Secondary | ICD-10-CM

## 2016-07-08 ENCOUNTER — Ambulatory Visit
Admission: RE | Admit: 2016-07-08 | Discharge: 2016-07-08 | Disposition: A | Discharge status: Home / Self Care | Payer: Medicare (Managed Care) | Source: Ambulatory Visit | Attending: Pain Medicine | Admitting: Pain Medicine

## 2016-07-08 ENCOUNTER — Encounter: Payer: Self-pay | Admitting: Pain Medicine

## 2016-07-08 ENCOUNTER — Ambulatory Visit (HOSPITAL_BASED_OUTPATIENT_CLINIC_OR_DEPARTMENT_OTHER): Payer: Medicare (Managed Care) | Admitting: Pain Medicine

## 2016-07-08 ENCOUNTER — Other Ambulatory Visit: Payer: Self-pay | Admitting: *Deleted

## 2016-07-08 VITALS — BP 127/77 | HR 81 | Temp 97.6°F | Resp 14 | Ht 69.0 in | Wt 292.0 lb

## 2016-07-08 DIAGNOSIS — M1288 Other specific arthropathies, not elsewhere classified, other specified site: Secondary | ICD-10-CM

## 2016-07-08 DIAGNOSIS — M47816 Spondylosis without myelopathy or radiculopathy, lumbar region: Secondary | ICD-10-CM | POA: Insufficient documentation

## 2016-07-08 DIAGNOSIS — M545 Low back pain: Secondary | ICD-10-CM | POA: Insufficient documentation

## 2016-07-08 DIAGNOSIS — G8918 Other acute postprocedural pain: Secondary | ICD-10-CM | POA: Insufficient documentation

## 2016-07-08 DIAGNOSIS — J449 Chronic obstructive pulmonary disease, unspecified: Secondary | ICD-10-CM | POA: Diagnosis not present

## 2016-07-08 DIAGNOSIS — M47896 Other spondylosis, lumbar region: Secondary | ICD-10-CM

## 2016-07-08 DIAGNOSIS — G8929 Other chronic pain: Secondary | ICD-10-CM

## 2016-07-08 MED ORDER — TRIAMCINOLONE ACETONIDE 40 MG/ML IJ SUSP
INTRAMUSCULAR | Status: AC
  Administered 2016-07-08: 12:00:00
  Filled 2016-07-08: qty 1

## 2016-07-08 MED ORDER — LIDOCAINE HCL (PF) 1 % IJ SOLN: 10.0000 mL | Freq: Once | INTRAMUSCULAR | Status: DC

## 2016-07-08 MED ORDER — FENTANYL CITRATE (PF) 100 MCG/2ML IJ SOLN
INTRAMUSCULAR | Status: AC
  Administered 2016-07-08: 50 ug
  Filled 2016-07-08: qty 2

## 2016-07-08 MED ORDER — ROPIVACAINE HCL 2 MG/ML IJ SOLN: 9.0000 mL | Freq: Once | INTRAMUSCULAR | Status: DC

## 2016-07-08 MED ORDER — MIDAZOLAM HCL 5 MG/5ML IJ SOLN
INTRAMUSCULAR | Status: AC
  Administered 2016-07-08: 2 mg
  Filled 2016-07-08: qty 5

## 2016-07-08 MED ORDER — OXYCODONE-ACETAMINOPHEN 7.5-325 MG PO TABS: 1.0000 | ORAL_TABLET | Freq: Four times a day (QID) | ORAL | 0 refills | Status: DC | PRN

## 2016-07-08 MED ORDER — TRIAMCINOLONE ACETONIDE 40 MG/ML IJ SUSP: 40.0000 mg | Freq: Once | INTRAMUSCULAR | Status: DC

## 2016-07-08 MED ORDER — ROPIVACAINE HCL 2 MG/ML IJ SOLN
INTRAMUSCULAR | Status: AC
  Administered 2016-07-08: 12:00:00
  Filled 2016-07-08: qty 10

## 2016-07-08 NOTE — Progress Notes (Signed)
Safety precautions to be maintained throughout the outpatient stay will include: orient to surroundings, keep bed in low position, maintain call bell within reach at all times, provide assistance with transfer out of bed and ambulation.  

## 2016-07-08 NOTE — Progress Notes (Signed)
Patient's Name: James Friedman  MRN: [id number]  Referring Provider: Bobbye Mortoneilly, Sharon A, MD  DOB: Jul 16, 1946  PCP: Bobbye MortonSHARON A REILLY, MD  DOS: 07/08/2016  Note by: Sydnee LevansFrancisco A. Laban EmperorNaveira, MD  Service setting: Ambulatory outpatient  Location: [facility] (AMB) Pain Management Facility  Visit type: Procedure  Specialty: Interventional Pain Management  Patient type: Established   Primary Reason for Visit: Interventional Pain Management Treatment. CC: Back Pain (low and mostly on the right)  Procedure:  Anesthesia, Analgesia, Anxiolysis:  Type: Therapeutic Medial Branch Facet Radiofrequency Ablation Region: Lumbar Level: L2, L3, L4, L5, & S1 Medial Branch Level(s) Laterality: Right-Sided  Type: Local Anesthesia with Moderate (Conscious) Sedation Local Anesthetic: Lidocaine 1% Route: Intravenous (IV) IV Access: Secured Sedation: Meaningful verbal contact was maintained at all times during the procedure  Indication(s): Analgesia and Anxiety  Indications: 1. Lumbar facet syndrome (Bilateral) (L>R)   2. Lumbar facet hypertrophy (L4-5 and L5-S1)   3. Chronic low back pain (Location of Primary Source of Pain) (Bilateral) (L>R)   4. Lumbar spondylosis   5. Postoperative pain (post-radiofrequency)   6. COPD 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 [facility](HCC)    James Friedman has either failed to respond, was unable to tolerate, or simply did not get enough benefit from other more conservative therapies including, but not limited to: 1. Over-the-counter medications 2. Anti-inflammatory medications 3. Muscle relaxants 4. Membrane stabilizers 5. Opioids 6. Physical therapy 7. Modalities (Heat, ice, etc.) 8. Invasive techniques such as nerve blocks. James Friedman has attained more than 50% relief of the pain from a series of diagnostic injections conducted in separate occasions.  Pain Score: Pre-procedure: 3 /10 Post-procedure: 0-No pain/10  Pre-Procedure Assessment:  James Friedman is a 70 y.o. (year old), male patient, seen today  for interventional treatment. He  has a past surgical history that includes Knee surgery (Bilateral); Neck surgery; Carpal tunnel release; Shoulder surgery (Right); Cataract extraction, extracapsular (Left, 08/04/2015); and Cataract extraction w/PHACO (Right, 04/19/2016).. His primarily concern today is the Back Pain (low and mostly on the right) The primary encounter diagnosis was Lumbar facet syndrome (Bilateral) (L>R). Diagnoses of Lumbar facet hypertrophy (L4-5 and L5-S1), Chronic low back pain (Location of Primary Source of Pain) (Bilateral) (L>R), Lumbar spondylosis, Postoperative pain (post-radiofrequency), and COPD 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 (HCC) were also pertinent to this visit.  Pain Type: Chronic pain Pain Descriptors / Indicators: Aching, Throbbing (aggrivating) Pain Frequency: Intermittent  Date of Last Visit: 12/09/15 Service Provided on Last Visit: Procedure (left lumbar RF)  Coagulation Parameters No results found for: INR, LABPROT, APTT, PLT Verification of the correct person, correct site (including marking of site), and correct procedure were performed and confirmed by the patient.  Consent: Before the procedure and under the influence of no sedative(s), amnesic(s), or anxiolytics, the patient was informed of the treatment options, risks and possible complications. To fulfill our ethical and legal obligations, as recommended by the American Medical Association's Code of Ethics, I have informed the patient of my clinical impression; the nature and purpose of the treatment or procedure; the risks, benefits, and possible complications of the intervention; the alternatives, including doing nothing; the risk(s) and benefit(s) of the alternative treatment(s) or procedure(s); and the risk(s) and benefit(s) of doing nothing. The patient was provided information about the general risks and possible complications associated with the procedure. These may include, but are not limited to: failure to  achieve desired goals, infection, bleeding, organ or nerve damage, allergic reactions, paralysis, and death. In addition, the patient was informed of those risks and complications  associated to Spine-related procedures, such as failure to decrease pain; infection (i.e.: Meningitis, epidural or intraspinal abscess); bleeding (i.e.: epidural hematoma, subarachnoid hemorrhage, or any other type of intraspinal or peri-dural bleeding); organ or nerve damage (i.e.: Any type of peripheral nerve, nerve root, or spinal cord injury) with subsequent damage to sensory, motor, and/or autonomic systems, resulting in permanent pain, numbness, and/or weakness of one or several areas of the body; allergic reactions; (i.e.: anaphylactic reaction); and/or death. Furthermore, the patient was informed of those risks and complications associated with the medications. These include, but are not limited to: allergic reactions (i.e.: anaphylactic or anaphylactoid reaction(s)); adrenal axis suppression; blood sugar elevation that in diabetics may result in ketoacidosis or comma; water retention that in patients with history of congestive heart failure may result in shortness of breath, pulmonary edema, and decompensation with resultant heart failure; weight gain; swelling or edema; medication-induced neural toxicity; particulate matter embolism and blood vessel occlusion with resultant organ, and/or nervous system infarction; and/or aseptic necrosis of one or more joints. Finally, the patient was informed that Medicine is not an exact science; therefore, there is also the possibility of unforeseen or unpredictable risks and/or possible complications that may result in a catastrophic outcome. The patient indicated having understood very clearly. We have given the patient no guarantees and we have made no promises. Enough time was given to the patient to ask questions, all of which were answered to the patient's satisfaction. James Friedman  has indicated that he wanted to continue with the procedure.  Consent Attestation: I, the ordering provider, attest that I have discussed with the patient the benefits, risks, side-effects, alternatives, likelihood of achieving goals, and potential problems during recovery for the procedure that I have provided informed consent.  Pre-Procedure Preparation:  Safety Precautions: Allergies reviewed. The patient was asked about blood thinners, or active infections, both of which were denied. The patient was asked to confirm the procedure and laterality, before marking the site, and again before commencing the procedure. Appropriate site, procedure, and patient were confirmed by following the Joint Commission's Universal Protocol (UP.01.01.01), in the form of a "Time Out". The patient was asked to participate by confirming the accuracy of the "Time Out" information. Patient was assessed for positional comfort and pressure points before starting the procedure. Allergies: He has No Known Allergies.. Allergy Precautions: None required Infection Control Precautions: Sterile technique used. Standard Universal Precautions were taken as recommended by the Department of [facility] for Disease Control and Prevention (CDC). Standard pre-surgical skin prep was conducted. Respiratory hygiene and cough etiquette was practiced. Hand hygiene observed. Safe injection practices and needle disposal techniques followed. SDV (single dose vial) medications used. Medications properly checked for expiration dates and contaminants. Personal protective equipment (PPE) used as per protocol. Monitoring:  As per clinic protocol. Vitals:   07/08/16 1305 07/08/16 1311 07/08/16 1321 07/08/16 1330  BP: 129/87 134/79 130/72 127/77  Pulse:      Resp: 12 15 12 14   Temp:  97.8 F (36.6 C)  97.6 F (36.4 C)  TempSrc:      SpO2: 99% 97% 96% 97%  Weight:      Height:      Calculated BMI: Body mass index is 43.12  kg/m. Time-out: "Time-out" completed before starting procedure, as per protocol.  Description of Procedure Process:   Time-out: "Time-out" completed before starting procedure, as per protocol. Position: Prone Target Area: For Lumbar Facet blocks, the target is the groove formed by the junction of the  transverse process and superior articular process. For the L5 dorsal ramus, the target is the notch between superior articular process and sacral ala. For the S1 dorsal ramus, the target is the superior and lateral edge of the posterior S1 Sacral foramen. Approach: Paraspinal approach. Area Prepped: Entire Posterior Lumbosacral Region Prepping solution: ChloraPrep (2% chlorhexidine gluconate and 70% isopropyl alcohol) Safety Precautions: Aspiration looking for blood return was conducted prior to all injections. At no point did we inject any substances, as a needle was being advanced. No attempts were made at seeking any paresthesias. Safe injection practices and needle disposal techniques used. Medications properly checked for expiration dates. SDV (single dose vial) medications used. Description of the Procedure: Protocol guidelines were followed. The patient was placed in position over the fluoroscopy table. The target area was identified and the area prepped in the usual manner. Skin desensitized using vapocoolant spray. Skin & deeper tissues infiltrated with local anesthetic. Appropriate amount of time allowed to pass for local anesthetics to take effect. Radiofrequency needles were introduced to the area of the medial branch at the junction of the superior articular process and transverse process using fluoroscopy. Using the [employer], sensory stimulation using 50 Hz was used to locate & identify the nerve, making sure that the needle was positioned such that there was no sensory stimulation below 0.3 V or above 0.7 V. Stimulation using 2 Hz was used to evaluate the motor  component. Care was taken not to lesion any nerves that demonstrated motor stimulation of the lower extremities at an output of less than 2.5 times that of the sensory threshold, or a maximum of 2.0 V. Once satisfactory placement of the needles was achieved, the above solution was slowly injected after negative aspiration. After waiting for at least 2 minutes, the ablation was performed at 80 degrees C for 60 seconds.The needles were then removed and the area cleansed, making sure to leave some of the prepping solution back to take advantage of its long term bactericidal properties. EBL: None Materials & Medications Used:  Needle(s) Used: Teflon-Coated Radiofrequency Needles  Imaging Guidance (Spinal):  Type of Imaging Technique: Fluoroscopy Guidance (Spinal) Indication(s): Assistance in needle guidance and placement for procedures requiring needle placement in or near specific anatomical locations not easily accessible without such assistance. Exposure Time: Please see nurses notes. Contrast: None used. Fluoroscopic Guidance: I was personally present during the use of fluoroscopy. "Tunnel Vision Technique" used to obtain the best possible view of the target area. Parallax error corrected before commencing the procedure. "Direction-depth-direction" technique used to introduce the needle under continuous pulsed fluoroscopy. Once target was reached, antero-posterior, oblique, and lateral fluoroscopic projection used confirm needle placement in all planes. Images permanently stored in EMR. Interpretation: No contrast injected. I personally interpreted the imaging intraoperatively. Adequate needle placement confirmed in multiple planes. Permanent images saved into the patient's record.  Antibiotic Prophylaxis:  Indication(s): No indications identified. Type:  Antibiotics Given (last 72 hours)    None      Post-operative Assessment:  Complications: No immediate post-treatment complications observed  by team, or reported by patient. Disposition: The patient tolerated the entire procedure well. A repeat set of vitals were taken after the procedure and the patient was kept under observation following institutional policy, for this type of procedure. Post-procedural neurological assessment was performed, showing return to baseline, prior to discharge. The patient was provided with post-procedure discharge instructions, including a section on how to identify potential problems. Should any problems arise concerning this procedure,  the patient was given instructions to immediately contact us, at any time, without hesitation. In any case, we plan to contact the patient by telephone for a follow-up status report regarding this interventional procedure. Comments:  No additional relevant information.  Plan of Care  Discharge to: Discharge home  Medications ordered for procedure: Meds ordered this encounter  Medications  . ropivacaine (PF) 2 mg/ml (0.2%) (NAROPIN) 2 MG/ML epidural    Roney Mans L: cabinet override  . triamcinolone acetonide (KENALOG-40) 40 MG/ML injection    Roney Mans L: cabinet override  . fentaNYL (SUBLIMAZE) 100 MCG/2ML injection    Roney Mans L: cabinet override  . midazolam (VERSED) 5 MG/5ML injection    Roney Mans L: cabinet override  . triamcinolone acetonide (KENALOG-40) injection 40 mg  . lidocaine (PF) (XYLOCAINE) 1 % injection 10 mL  . ropivacaine (PF) 2 mg/ml (0.2%) (NAROPIN) epidural 9 mL  . oxyCODONE-acetaminophen (PERCOCET) 7.5-325 MG tablet    Sig: Take 1 tablet by mouth every 6 (six) hours as needed for moderate pain or severe pain (For post-radiofrequency pain only.).    Dispense:  120 tablet    Refill:  0    Do not place this medication, or any other prescription from our practice, on "Automatic Refill". Patient may have prescription filled one day early if pharmacy is closed on scheduled refill date. Do not fill until: 07/08/16 To last  until: 08/07/16   Medications administered: (For more details, see medical record) We administered ropivacaine (PF) 2 mg/ml (0.2%), triamcinolone acetonide, fentaNYL, and midazolam.  Imaging Ordered: No results found for this or any previous visit. New Prescriptions   No medications on file   Physician-requested Follow-up:  Return in about 6 weeks (around 08/19/2016) for Post-Procedure evaluation.  Future Appointments Date Time Provider Department Center  08/16/2016 1:00 PM Delano Metz, MD [facility] None   Primary Care Physician: Bobbye Morton, MD Location: [facility] Note by: Sydnee Levans. Laban Emperor, M.D, DABA, DABAPM, DABPM, DABIPP, FIPP  Disclaimer:  Medicine is not an exact science. The only guarantee in medicine is that nothing is guaranteed. It is important to note that the decision to proceed with this intervention was based on the information collected from the patient. The Data and conclusions were drawn from the patient's questionnaire, the interview, and the physical examination. Because the information was provided in large part by the patient, it cannot be guaranteed that it has not been purposely or unconsciously manipulated. Every effort has been made to obtain as much relevant data as possible for this evaluation. It is important to note that the conclusions that lead to this procedure are derived in large part from the available data. Always take into account that the treatment will also be dependent on availability of resources and existing treatment guidelines, considered by other Pain Management Practitioners as being common knowledge and practice, at the time of the intervention. For Medico-Legal purposes, it is also important to point out that variation in procedural techniques and pharmacological choices are the acceptable norm. The indications, contraindications, technique, and results of the above procedure should only be interpreted  and judged by a Board-Certified Interventional Pain Specialist with extensive familiarity and expertise in the same exact procedure and technique. Attempts at providing opinions without similar or greater experience and expertise than that of the treating physician will be considered as inappropriate and unethical, and shall result in a formal complaint to the state medical board and applicable specialty societies.  Instructions provided at  this appointment: Patient Instructions  Radiofrequency Lesioning Radiofrequency lesioning is a procedure that is performed to relieve pain. The procedure is often used for back, neck, or arm pain. Radiofrequency lesioning involves the use of a machine that creates radio waves to make heat. During the procedure, the heat is applied to the nerve that carries the pain signal. The heat damages the nerve and interferes with the pain signal. Pain relief usually lasts for 6 months to 1 year. LET [facility] CARE PROVIDER KNOW ABOUT:  Any allergies you have.  All medicines you are taking, including vitamins, herbs, eye drops, creams, and over-the-counter medicines.  Previous problems you or members of your family have had with the use of anesthetics.  Any blood disorders you have.  Previous surgeries you have had.  Any medical conditions you have.  Whether you are pregnant or may be pregnant. RISKS AND COMPLICATIONS Generally, this is a safe procedure. However, problems may occur, including:  Pain or soreness at the injection site.  Infection at the injection site.  Damage to nerves or blood vessels. BEFORE THE PROCEDURE  Ask your health care provider about:  Changing or stopping your regular medicines. This is especially important if you are taking diabetes medicines or blood thinners.  Taking medicines such as aspirin and ibuprofen. These medicines can thin your blood. Do not take these medicines before your procedure if your health care provider  instructs you not to.  Follow instructions from your health care provider about eating or drinking restrictions.  Plan to have someone take you home after the procedure.  If you go home right after the procedure, plan to have someone with you for 24 hours. PROCEDURE  You will be given one or more of the following:  A medicine to help you relax (sedative).  A medicine to numb the area (local anesthetic).  You will be awake during the procedure. You will need to be able to talk with the health care provider during the procedure.  With the help of a type of X-ray (fluoroscopy), the health care provider will insert a radiofrequency needle into the area to be treated.  Next, a wire that carries the radio waves (electrode) will be put through the radiofrequency needle. An electrical pulse will be sent through the electrode to verify the correct nerve. You will feel a tingling sensation, and you may have muscle twitching.  Then, the tissue that is around the needle tip will be heated by an electric current that is passed using the radiofrequency machine. This will numb the nerves.  A bandage (dressing) will be put on the insertion area after the procedure is done. The procedure may vary among health care providers and hospitals. AFTER THE PROCEDURE  Your blood pressure, heart rate, breathing rate, and blood oxygen level will be monitored often until the medicines you were given have worn off.  Return to your normal activities as directed by your health care provider.   This information is not intended to replace advice given to you by your health care provider. Make sure you discuss any questions you have with your health care provider.   Document Released: 05/05/2011 Document Revised: 05/28/2015 Document Reviewed: 10/14/2014 Elsevier Interactive Patient Education 2016 [employer] Pain Management Discharge Instructions  General Discharge Instructions :  If you need to reach your  doctor call: Monday-Friday 8:00 am - 4:00 pm at [phone] or toll free [phone].  After clinic hours [phone] to have operator reach doctor.  Bring all of  your medication bottles to all your appointments in the pain clinic.  To cancel or reschedule your appointment with Pain Management please remember to call 24 hours in advance to avoid a fee.  Refer to the educational materials which you have been given on: General Risks, I had my Procedure. Discharge Instructions, Post Sedation.  Post Procedure Instructions:  The drugs you were given will stay in your system until tomorrow, so for the next 24 hours you should not drive, make any legal decisions or drink any alcoholic beverages.  You may eat anything you prefer, but it is better to start with liquids then soups and crackers, and gradually work up to solid foods.  Please notify your doctor immediately if you have any unusual bleeding, trouble breathing or pain that is not related to your normal pain.  Depending on the type of procedure that was done, some parts of your body may feel week and/or numb.  This usually clears up by tonight or the next day.  Walk with the use of an assistive device or accompanied by an adult for the 24 hours.  You may use ice on the affected area for the first 24 hours.  Put ice in a Ziploc bag and cover with a towel and place against area 15 minutes on 15 minutes off.  You may switch to heat after 24 hours.

## 2016-07-08 NOTE — Patient Instructions (Signed)
Radiofrequency Lesioning Radiofrequency lesioning is a procedure that is performed to relieve pain. The procedure is often used for back, neck, or arm pain. Radiofrequency lesioning involves the use of a machine that creates radio waves to make heat. During the procedure, the heat is applied to the nerve that carries the pain signal. The heat damages the nerve and interferes with the pain signal. Pain relief usually lasts for 6 months to 1 year. LET YOUR HEALTH CARE PROVIDER KNOW ABOUT:  Any allergies you have.  All medicines you are taking, including vitamins, herbs, eye drops, creams, and over-the-counter medicines.  Previous problems you or members of your family have had with the use of anesthetics.  Any blood disorders you have.  Previous surgeries you have had.  Any medical conditions you have.  Whether you are pregnant or may be pregnant. RISKS AND COMPLICATIONS Generally, this is a safe procedure. However, problems may occur, including:  Pain or soreness at the injection site.  Infection at the injection site.  Damage to nerves or blood vessels. BEFORE THE PROCEDURE  Ask your health care provider about:  Changing or stopping your regular medicines. This is especially important if you are taking diabetes medicines or blood thinners.  Taking medicines such as aspirin and ibuprofen. These medicines can thin your blood. Do not take these medicines before your procedure if your health care provider instructs you not to.  Follow instructions from your health care provider about eating or drinking restrictions.  Plan to have someone take you home after the procedure.  If you go home right after the procedure, plan to have someone with you for 24 hours. PROCEDURE  You will be given one or more of the following:  A medicine to help you relax (sedative).  A medicine to numb the area (local anesthetic).  You will be awake during the procedure. You will need to be able to  talk with the health care provider during the procedure.  With the help of a type of X-ray (fluoroscopy), the health care provider will insert a radiofrequency needle into the area to be treated.  Next, a wire that carries the radio waves (electrode) will be put through the radiofrequency needle. An electrical pulse will be sent through the electrode to verify the correct nerve. You will feel a tingling sensation, and you may have muscle twitching.  Then, the tissue that is around the needle tip will be heated by an electric current that is passed using the radiofrequency machine. This will numb the nerves.  A bandage (dressing) will be put on the insertion area after the procedure is done. The procedure may vary among health care providers and hospitals. AFTER THE PROCEDURE  Your blood pressure, heart rate, breathing rate, and blood oxygen level will be monitored often until the medicines you were given have worn off.  Return to your normal activities as directed by your health care provider.   This information is not intended to replace advice given to you by your health care provider. Make sure you discuss any questions you have with your health care provider.   Document Released: 05/05/2011 Document Revised: 05/28/2015 Document Reviewed: 10/14/2014 Elsevier Interactive Patient Education 2016 [employer] Pain Management Discharge Instructions  General Discharge Instructions :  If you need to reach your doctor call: Monday-Friday 8:00 am - 4:00 pm at [phone] or toll free [phone].  After clinic hours [phone] to have operator reach doctor.  Bring all of your medication bottles to all your   appointments in the pain clinic.  To cancel or reschedule your appointment with Pain Management please remember to call 24 hours in advance to avoid a fee.  Refer to the educational materials which you have been given on: General Risks, I had my Procedure. Discharge Instructions,  Post Sedation.  Post Procedure Instructions:  The drugs you were given will stay in your system until tomorrow, so for the next 24 hours you should not drive, make any legal decisions or drink any alcoholic beverages.  You may eat anything you prefer, but it is better to start with liquids then soups and crackers, and gradually work up to solid foods.  Please notify your doctor immediately if you have any unusual bleeding, trouble breathing or pain that is not related to your normal pain.  Depending on the type of procedure that was done, some parts of your body may feel week and/or numb.  This usually clears up by tonight or the next day.  Walk with the use of an assistive device or accompanied by an adult for the 24 hours.  You may use ice on the affected area for the first 24 hours.  Put ice in a Ziploc bag and cover with a towel and place against area 15 minutes on 15 minutes off.  You may switch to heat after 24 hours. 

## 2016-07-09 ENCOUNTER — Telehealth: Payer: Self-pay | Admitting: *Deleted

## 2016-07-09 NOTE — Telephone Encounter (Signed)
Patient verbalizes no c/o or concerns from procedure on yesterday.  

## 2016-07-16 NOTE — Progress Notes (Signed)
Delete this note

## 2016-07-18 ENCOUNTER — Encounter: Payer: Self-pay | Admitting: Pain Medicine

## 2016-07-18 NOTE — Progress Notes (Signed)
Delete this cancelled encounter. 

## 2016-07-18 NOTE — Progress Notes (Signed)
Delete this cancelled encounter.

## 2016-07-18 NOTE — Progress Notes (Signed)
Delete this note

## 2016-08-16 ENCOUNTER — Ambulatory Visit: Payer: Medicare (Managed Care) | Attending: Pain Medicine | Admitting: Pain Medicine

## 2016-08-16 ENCOUNTER — Encounter: Payer: Self-pay | Admitting: Pain Medicine

## 2016-08-16 VITALS — BP 108/66 | HR 79 | Temp 98.2°F | Resp 16 | Ht 69.0 in | Wt 282.0 lb

## 2016-08-16 DIAGNOSIS — I10 Essential (primary) hypertension: Secondary | ICD-10-CM | POA: Diagnosis not present

## 2016-08-16 DIAGNOSIS — K219 Gastro-esophageal reflux disease without esophagitis: Secondary | ICD-10-CM | POA: Diagnosis not present

## 2016-08-16 DIAGNOSIS — G4733 Obstructive sleep apnea (adult) (pediatric): Secondary | ICD-10-CM | POA: Insufficient documentation

## 2016-08-16 DIAGNOSIS — G894 Chronic pain syndrome: Secondary | ICD-10-CM | POA: Insufficient documentation

## 2016-08-16 DIAGNOSIS — M25511 Pain in right shoulder: Secondary | ICD-10-CM | POA: Insufficient documentation

## 2016-08-16 DIAGNOSIS — M545 Low back pain, unspecified: Secondary | ICD-10-CM

## 2016-08-16 DIAGNOSIS — Z7982 Long term (current) use of aspirin: Secondary | ICD-10-CM | POA: Diagnosis not present

## 2016-08-16 DIAGNOSIS — M47816 Spondylosis without myelopathy or radiculopathy, lumbar region: Secondary | ICD-10-CM | POA: Insufficient documentation

## 2016-08-16 DIAGNOSIS — M4722 Other spondylosis with radiculopathy, cervical region: Secondary | ICD-10-CM | POA: Insufficient documentation

## 2016-08-16 DIAGNOSIS — G8929 Other chronic pain: Secondary | ICD-10-CM | POA: Diagnosis not present

## 2016-08-16 DIAGNOSIS — Z8249 Family history of ischemic heart disease and other diseases of the circulatory system: Secondary | ICD-10-CM | POA: Diagnosis not present

## 2016-08-16 DIAGNOSIS — M1288 Other specific arthropathies, not elsewhere classified, other specified site: Secondary | ICD-10-CM

## 2016-08-16 DIAGNOSIS — Z794 Long term (current) use of insulin: Secondary | ICD-10-CM | POA: Diagnosis not present

## 2016-08-16 DIAGNOSIS — M25561 Pain in right knee: Secondary | ICD-10-CM | POA: Insufficient documentation

## 2016-08-16 DIAGNOSIS — Z79899 Other long term (current) drug therapy: Secondary | ICD-10-CM | POA: Diagnosis not present

## 2016-08-16 DIAGNOSIS — M9983 Other biomechanical lesions of lumbar region: Secondary | ICD-10-CM | POA: Diagnosis not present

## 2016-08-16 DIAGNOSIS — M488X6 Other specified spondylopathies, lumbar region: Secondary | ICD-10-CM | POA: Diagnosis not present

## 2016-08-16 DIAGNOSIS — F419 Anxiety disorder, unspecified: Secondary | ICD-10-CM | POA: Diagnosis not present

## 2016-08-16 DIAGNOSIS — Z79891 Long term (current) use of opiate analgesic: Secondary | ICD-10-CM | POA: Diagnosis not present

## 2016-08-16 DIAGNOSIS — Z87891 Personal history of nicotine dependence: Secondary | ICD-10-CM | POA: Insufficient documentation

## 2016-08-16 DIAGNOSIS — Z6841 Body Mass Index (BMI) 40.0 and over, adult: Secondary | ICD-10-CM | POA: Insufficient documentation

## 2016-08-16 DIAGNOSIS — N400 Enlarged prostate without lower urinary tract symptoms: Secondary | ICD-10-CM | POA: Diagnosis not present

## 2016-08-16 DIAGNOSIS — E1142 Type 2 diabetes mellitus with diabetic polyneuropathy: Secondary | ICD-10-CM | POA: Insufficient documentation

## 2016-08-16 DIAGNOSIS — E785 Hyperlipidemia, unspecified: Secondary | ICD-10-CM | POA: Diagnosis not present

## 2016-08-16 DIAGNOSIS — M1990 Unspecified osteoarthritis, unspecified site: Secondary | ICD-10-CM | POA: Insufficient documentation

## 2016-08-16 DIAGNOSIS — M48061 Spinal stenosis, lumbar region without neurogenic claudication: Secondary | ICD-10-CM | POA: Insufficient documentation

## 2016-08-16 DIAGNOSIS — Z7951 Long term (current) use of inhaled steroids: Secondary | ICD-10-CM | POA: Insufficient documentation

## 2016-08-16 DIAGNOSIS — M791 Myalgia: Secondary | ICD-10-CM | POA: Insufficient documentation

## 2016-08-16 DIAGNOSIS — R531 Weakness: Secondary | ICD-10-CM | POA: Diagnosis not present

## 2016-08-16 DIAGNOSIS — M4316 Spondylolisthesis, lumbar region: Secondary | ICD-10-CM | POA: Insufficient documentation

## 2016-08-16 DIAGNOSIS — J449 Chronic obstructive pulmonary disease, unspecified: Secondary | ICD-10-CM | POA: Insufficient documentation

## 2016-08-16 NOTE — Patient Instructions (Signed)
GENERAL RISKS AND COMPLICATIONS  What are the risk, side effects and possible complications? Generally speaking, most procedures are safe.  However, with any procedure there are risks, side effects, and the possibility of complications.  The risks and complications are dependent upon the sites that are lesioned, or the type of nerve block to be performed.  The closer the procedure is to the spine, the more serious the risks are.  Great care is taken when placing the radio frequency needles, block needles or lesioning probes, but sometimes complications can occur. 1. Infection: Any time there is an injection through the skin, there is a risk of infection.  This is why sterile conditions are used for these blocks.  There are four possible types of infection. 1. Localized skin infection. 2. Central Nervous System Infection-This can be in the form of Meningitis, which can be deadly. 3. Epidural Infections-This can be in the form of an epidural abscess, which can cause pressure inside of the spine, causing compression of the spinal cord with subsequent paralysis. This would require an emergency surgery to decompress, and there are no guarantees that the patient would recover from the paralysis. 4. Discitis-This is an infection of the intervertebral discs.  It occurs in about 1% of discography procedures.  It is difficult to treat and it may lead to surgery.        2. Pain: the needles have to go through skin and soft tissues, will cause soreness.       3. Damage to internal structures:  The nerves to be lesioned may be near blood vessels or    other nerves which can be potentially damaged.       4. Bleeding: Bleeding is more common if the patient is taking blood thinners such as  aspirin, Coumadin, Ticiid, Plavix, etc., or if he/she have some genetic predisposition  such as hemophilia. Bleeding into the spinal canal can cause compression of the spinal  cord with subsequent paralysis.  This would require an  emergency surgery to  decompress and there are no guarantees that the patient would recover from the  paralysis.       5. Pneumothorax:  Puncturing of a lung is a possibility, every time a needle is introduced in  the area of the chest or upper back.  Pneumothorax refers to free air around the  collapsed lung(s), inside of the thoracic cavity (chest cavity).  Another two possible  complications related to a similar event would include: Hemothorax and Chylothorax.   These are variations of the Pneumothorax, where instead of air around the collapsed  lung(s), you may have blood or chyle, respectively.       6. Spinal headaches: They may occur with any procedures in the area of the spine.       7. Persistent CSF (Cerebro-Spinal Fluid) leakage: This is a rare problem, but may occur  with prolonged intrathecal or epidural catheters either due to the formation of a fistulous  track or a dural tear.       8. Nerve damage: By working so close to the spinal cord, there is always a possibility of  nerve damage, which could be as serious as a permanent spinal cord injury with  paralysis.       9. Death:  Although rare, severe deadly allergic reactions known as "Anaphylactic  reaction" can occur to any of the medications used.      10. Worsening of the symptoms:  We can always make thing worse.    What are the chances of something like this happening? Chances of any of this occuring are extremely low.  By statistics, you have more of a chance of getting killed in a motor vehicle accident: while driving to the hospital than any of the above occurring .  Nevertheless, you should be aware that they are possibilities.  In general, it is similar to taking a shower.  Everybody knows that you can slip, hit your head and get killed.  Does that mean that you should not shower again?  Nevertheless always keep in mind that statistics do not mean anything if you happen to be on the wrong side of them.  Even if a procedure has a 1  (one) in a 1,000,000 (million) chance of going wrong, it you happen to be that one..Also, keep in mind that by statistics, you have more of a chance of having something go wrong when taking medications.  Who should not have this procedure? If you are on a blood thinning medication (e.g. Coumadin, Plavix, see list of "Blood Thinners"), or if you have an active infection going on, you should not have the procedure.  If you are taking any blood thinners, please inform your physician.  How should I prepare for this procedure?  Do not eat or drink anything at least six hours prior to the procedure.  Bring a driver with you .  It cannot be a taxi.  Come accompanied by an adult that can drive you back, and that is strong enough to help you if your legs get weak or numb from the local anesthetic.  Take all of your medicines the morning of the procedure with just enough water to swallow them.  If you have diabetes, make sure that you are scheduled to have your procedure done first thing in the morning, whenever possible.  If you have diabetes, take only half of your insulin dose and notify our nurse that you have done so as soon as you arrive at the clinic.  If you are diabetic, but only take blood sugar pills (oral hypoglycemic), then do not take them on the morning of your procedure.  You may take them after you have had the procedure.  Do not take aspirin or any aspirin-containing medications, at least eleven (11) days prior to the procedure.  They may prolong bleeding.  Wear loose fitting clothing that may be easy to take off and that you would not mind if it got stained with Betadine or blood.  Do not wear any jewelry or perfume  Remove any nail coloring.  It will interfere with some of our monitoring equipment.  NOTE: Remember that this is not meant to be interpreted as a complete list of all possible complications.  Unforeseen problems may occur.  BLOOD THINNERS The following drugs  contain aspirin or other products, which can cause increased bleeding during surgery and should not be taken for 2 weeks prior to and 1 week after surgery.  If you should need take something for relief of minor pain, you may take acetaminophen which is found in Tylenol,m Datril, Anacin-3 and Panadol. It is not blood thinner. The products listed below are.  Do not take any of the products listed below in addition to any listed on your instruction sheet.  A.P.C or A.P.C with Codeine Codeine Phosphate Capsules #3 Ibuprofen Ridaura  ABC compound Congesprin Imuran rimadil  Advil Cope Indocin Robaxisal  Alka-Seltzer Effervescent Pain Reliever and Antacid Coricidin or Coricidin-D  Indomethacin Rufen    Alka-Seltzer plus Cold Medicine Cosprin Ketoprofen S-A-C Tablets  Anacin Analgesic Tablets or Capsules Coumadin Korlgesic Salflex  Anacin Extra Strength Analgesic tablets or capsules CP-2 Tablets Lanoril Salicylate  Anaprox Cuprimine Capsules Levenox Salocol  Anexsia-D Dalteparin Magan Salsalate  Anodynos Darvon compound Magnesium Salicylate Sine-off  Ansaid Dasin Capsules Magsal Sodium Salicylate  Anturane Depen Capsules Marnal Soma  APF Arthritis pain formula Dewitt's Pills Measurin Stanback  Argesic Dia-Gesic Meclofenamic Sulfinpyrazone  Arthritis Bayer Timed Release Aspirin Diclofenac Meclomen Sulindac  Arthritis pain formula Anacin Dicumarol Medipren Supac  Analgesic (Safety coated) Arthralgen Diffunasal Mefanamic Suprofen  Arthritis Strength Bufferin Dihydrocodeine Mepro Compound Suprol  Arthropan liquid Dopirydamole Methcarbomol with Aspirin Synalgos  ASA tablets/Enseals Disalcid Micrainin Tagament  Ascriptin Doan's Midol Talwin  Ascriptin A/D Dolene Mobidin Tanderil  Ascriptin Extra Strength Dolobid Moblgesic Ticlid  Ascriptin with Codeine Doloprin or Doloprin with Codeine Momentum Tolectin  Asperbuf Duoprin Mono-gesic Trendar  Aspergum Duradyne Motrin or Motrin IB Triminicin  Aspirin  plain, buffered or enteric coated Durasal Myochrisine Trigesic  Aspirin Suppositories Easprin Nalfon Trillsate  Aspirin with Codeine Ecotrin Regular or Extra Strength Naprosyn Uracel  Atromid-S Efficin Naproxen Ursinus  Auranofin Capsules Elmiron Neocylate Vanquish  Axotal Emagrin Norgesic Verin  Azathioprine Empirin or Empirin with Codeine Normiflo Vitamin E  Azolid Emprazil Nuprin Voltaren  Bayer Aspirin plain, buffered or children's or timed BC Tablets or powders Encaprin Orgaran Warfarin Sodium  Buff-a-Comp Enoxaparin Orudis Zorpin  Buff-a-Comp with Codeine Equegesic Os-Cal-Gesic   Buffaprin Excedrin plain, buffered or Extra Strength Oxalid   Bufferin Arthritis Strength Feldene Oxphenbutazone   Bufferin plain or Extra Strength Feldene Capsules Oxycodone with Aspirin   Bufferin with Codeine Fenoprofen Fenoprofen Pabalate or Pabalate-SF   Buffets II Flogesic Panagesic   Buffinol plain or Extra Strength Florinal or Florinal with Codeine Panwarfarin   Buf-Tabs Flurbiprofen Penicillamine   Butalbital Compound Four-way cold tablets Penicillin   Butazolidin Fragmin Pepto-Bismol   Carbenicillin Geminisyn Percodan   Carna Arthritis Reliever Geopen Persantine   Carprofen Gold's salt Persistin   Chloramphenicol Goody's Phenylbutazone   Chloromycetin Haltrain Piroxlcam   Clmetidine heparin Plaquenil   Cllnoril Hyco-pap Ponstel   Clofibrate Hydroxy chloroquine Propoxyphen         Before stopping any of these medications, be sure to consult the physician who ordered them.  Some, such as Coumadin (Warfarin) are ordered to prevent or treat serious conditions such as "deep thrombosis", "pumonary embolisms", and other heart problems.  The amount of time that you may need off of the medication may also vary with the medication and the reason for which you were taking it.  If you are taking any of these medications, please make sure you notify your pain physician before you undergo any  procedures.         Radiofrequency Lesioning Introduction Radiofrequency lesioning is a procedure that is performed to relieve pain. The procedure is often used for back, neck, or arm pain. Radiofrequency lesioning involves the use of a machine that creates radio waves to make heat. During the procedure, the heat is applied to the nerve that carries the pain signal. The heat damages the nerve and interferes with the pain signal. Pain relief usually starts about 2 weeks after the procedure and lasts for 6 months to 1 year. Tell a health care provider about:  Any allergies you have.  All medicines you are taking, including vitamins, herbs, eye drops, creams, and over-the-counter medicines.  Any problems you or family members have had with anesthetic medicines.  Any   blood disorders you have.  Any surgeries you have had.  Any medical conditions you have.  Whether you are pregnant or may be pregnant. What are the risks? Generally, this is a safe procedure. However, problems may occur, including:  Pain or soreness at the injection site.  Infection at the injection site.  Damage to nerves or blood vessels. What happens before the procedure?  Ask your health care provider about:  Changing or stopping your regular medicines. This is especially important if you are taking diabetes medicines or blood thinners.  Taking medicines such as aspirin and ibuprofen. These medicines can thin your blood. Do not take these medicines before your procedure if your health care provider instructs you not to.  Follow instructions from your health care provider about eating or drinking restrictions.  Plan to have someone take you home after the procedure.  If you go home right after the procedure, plan to have someone with you for 24 hours. What happens during the procedure?  You will be given one or more of the following:  A medicine to help you relax (sedative).  A medicine to numb the  area (local anesthetic).  You will be awake during the procedure. You will need to be able to talk with the health care provider during the procedure.  With the help of a type of X-ray (fluoroscopy), the health care provider will insert a radiofrequency needle into the area to be treated.  Next, a wire that carries the radio waves (electrode) will be put through the radiofrequency needle. An electrical pulse will be sent through the electrode to verify the correct nerve. You will feel a tingling sensation, and you may have muscle twitching.  Then, the tissue that is around the needle tip will be heated by an electric current that is passed using the radiofrequency machine. This will numb the nerves.  A bandage (dressing) will be put on the insertion area after the procedure is done. The procedure may vary among health care providers and hospitals. What happens after the procedure?  Your blood pressure, heart rate, breathing rate, and blood oxygen level will be monitored often until the medicines you were given have worn off.  Return to your normal activities as directed by your health care provider. This information is not intended to replace advice given to you by your health care provider. Make sure you discuss any questions you have with your health care provider. Document Released: 05/05/2011 Document Revised: 02/12/2016 Document Reviewed: 10/14/2014  2017 [employer]  

## 2016-08-16 NOTE — Progress Notes (Signed)
Safety precautions to be maintained throughout the outpatient stay will include: orient to surroundings, keep bed in low position, maintain call bell within reach at all times, provide assistance with transfer out of bed and ambulation.  

## 2016-08-16 NOTE — Progress Notes (Signed)
Patient's Name: James Friedman  MRN: [id number]  Referring Provider: Bobbye Morton, MD  DOB: Jul 04, 1946  PCP: Bobbye Morton, MD  DOS: 08/16/2016  Note by: Sydnee Levans. Laban Emperor, MD  Service setting: Ambulatory outpatient  Specialty: Interventional Pain Management  Location: [facility] (AMB) Pain Management Facility    Patient type: Established   Primary Reason(s) for Visit: Encounter for post-procedure evaluation of chronic illness with mild to moderate exacerbation CC: Back Pain (lower); Knee Pain (both); Ankle Pain (both); and Neck Pain (both sides)  HPI  James Friedman is a 70 y.o. year old, male patient, who comes today for a post-procedure evaluation. He has Hyperlipidemia; Pain in shoulder (Bilateral) (L>R); Non-traumatic rotator cuff tear; Chronic pain syndrome; Chronic low back pain (Location of Primary Source of Pain) (Bilateral) (L>R); Lumbosacral radiculopathy at S1 (Right side); Lumbar facet syndrome (Bilateral) (L>R); Chronic neck pain (Bilateral) (R>L); Chronic cervical radicular pain (Bilateral) (R>L); Cervical spondylosis; Morbid obesity (HCC); Long term current use of opiate analgesic; Long term prescription opiate use; Opiate use (105 MME/Day); Opiate dependence (HCC); Chronic sacroiliac joint pain (Bilateral) (R>L); Failed neck surgery syndrome; Chronic upper extremity pain (Bilateral) (R>L); History of bilateral knee surgery; Diffuse myofascial pain syndrome; Lumbar facet hypertrophy (L4-5 and L5-S1); Carpal tunnel syndrome on right (Released); Transient weakness of left lower extremity; At high risk for falls; Lumbar spinal stenosis (L3-4); 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); Grade 1 Anterolisthesis of L4 over L5; Lumbar disc herniation (Left L4-5) (Right L5-S1); Chronic lower extremity pain (Location of Secondary source of pain) (Bilateral) (R>L); Essential hypertension; Diabetes mellitus without complication (HCC); Atypical chest pain; Acute pain of right shoulder; Acute pain of  right knee; Chronic knee pain (Bilateral); Diabetic peripheral neuropathy (HCC); Chronic lumbar radicular pain (Location of Secondary source of pain) (Bilateral) (R>L) (S1 Dermatome on the left); Abnormal EMG/PNCV (11/26/2015); Postoperative pain (post-radiofrequency); Lumbar spondylosis; COPD 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 (HCC); Polyneuropathy (HCC); and Bursitis of right shoulder on his problem list. His primarily concern today is the Back Pain (lower); Knee Pain (both); Ankle Pain (both); and Neck Pain (both sides)  Pain Assessment: Self-Reported Pain Score: 1 /10             Reported level is compatible with observation.       Pain Type: Chronic pain Pain Location: Back Pain Orientation: Lower Pain Descriptors / Indicators: Aching, Constant (aggravating) Pain Frequency: Constant  James Friedman comes in today for post-procedure evaluation after the treatment done on 07/08/2016. The patient indicates having improved significantly after the radiofrequency. This is probably being helped by the fact that he has dropped some weight. Today I have encouraged him to continue losing weight and to work towards a goal of a BMI of 30. He is having problems with his ankles and he is using braces for them. I will see him on a when necessary basis. He was encouraged to give [place] a call when the pain returns. He has requested to come back for a regular checkup in 6 months to see if he may be needing a repeat radiofrequency at that point.  Further details on both, my assessment(s), as well as the proposed treatment plan, please see below.  Post-Procedure Assessment  07/08/2016 Procedure: Right-sided lumbar facet radiofrequency ablation under fluoroscopic guidance and IV sedation Influential Factors: BMI: 41.64 kg/m Intra-procedural challenges: Increased level of difficulty due to James Friedman's obesity Assessment challenges: None detected         Post-procedural side-effects, adverse reactions, or complications: None  reported Reported issues: None  Sedation: Please see nurses note. When no sedatives are used, the analgesic levels obtained are directly associated to the effectiveness of the local anesthetics. However, when sedation is provided, the level of analgesia obtained during the initial 1 hour following the intervention, is believed to be the result of a combination of factors. These factors may include, but are not limited to: 1. The effectiveness of the local anesthetics used. 2. The effects of the analgesic(s) and/or anxiolytic(s) used. 3. The degree of discomfort experienced by the patient at the time of the procedure. 4. The patients ability and reliability in recalling and recording the events. 5. The presence and influence of possible secondary gains and/or psychosocial factors. Reported result: Relief experienced during the 1st hour after the procedure: 100 % (Ultra-Short Term Relief) Interpretative annotation: Analgesia during this period is likely to be Local Anesthetic and/or IV Sedative (Analgesic/Anxiolitic) related.          Effects of local anesthetic: The analgesic effects attained during this period are directly associated to the localized infiltration of local anesthetics and therefore cary significant diagnostic value as to the etiological location, or anatomical origin, of the pain. Expected duration of relief is directly dependent on the pharmacodynamics of the local anesthetic used. Long-acting (4-6 hours) anesthetics used.  Reported result: Relief during the next 4 to 6 hour after the procedure: 100 % (Short-Term Relief) Interpretative annotation: Complete relief would suggest area to be the source of the pain.          Long-term benefit: Defined as the period of time past the expected duration of local anesthetics. With the possible exception of prolonged sympathetic blockade from the local anesthetics, benefits during this period are typically attributed to, or associated with,  other factors such as analgesic sensory neuropraxia, antiinflammatory effects, or beneficial biochemical changes provided by agents other than the local anesthetics Reported result: Extended relief following procedure: 85 % (Long-Term Relief) Interpretative annotation: Good relief. This could suggest inflammation to be a significant component in the etiology to the pain.          Current benefits: Defined as persistent relief that continues at this point in time.   Reported results: Treated area: 85 % In addition, the patient reports improvement in function Interpretative annotation: Long-term benefit would suggest adequate RF ablation  Interpretation: Results would suggest a successful intervention.         Successful radiofrequency.  Laboratory Chemistry  Inflammation Markers Lab Results  Component Value Date   ESRSEDRATE 7 10/11/2013   Renal Function Lab Results  Component Value Date   BUN 16 10/11/2013   CREATININE 1.13 10/11/2013   GFRAA >60 10/11/2013   GFRNONAA >60 10/11/2013   Hepatic Function Lab Results  Component Value Date   AST 20 10/11/2013   ALT 28 10/11/2013   ALBUMIN 3.8 10/11/2013   Electrolytes Lab Results  Component Value Date   NA 135 (L) 10/11/2013   K 4.6 10/11/2013   CL 98 10/11/2013   CALCIUM 8.5 10/11/2013   MG 1.8 05/24/2014   Pain Modulating Vitamins No results found for: VD25OH, VD125OH2TOT, ZO1096EA5, WU9811BJ4, 25OHVITD1, 25OHVITD2, 25OHVITD3, VITAMINB12 Coagulation Parameters No results found for: INR, LABPROT, APTT, PLT Cardiovascular No results found for: BNP, HGB, HCT Note: Lab results reviewed.  Recent Diagnostic Imaging Review  Dg C-arm 1-60 Min-no Report  Result Date: 07/08/2016 CLINICAL DATA: Assistance in needle guidance and placement for procedures requiring needle placement in or near specific anatomical locations not easily accessible without such assistance. C-ARM  1-60 MINUTES Fluoroscopy was utilized by the requesting  physician.  No radiographic interpretation.   Note: Imaging results reviewed.  Meds  The patient has a current medication list which includes the following prescription(s): acetaminophen, albuterol, albuterol, amlodipine, amoxicillin, antiseptic oral rinse, aspirin, atorvastatin, azelastine, capsaicin, cetirizine, chlorpheniramine, vitamin d3, clotrimazole, diclofenac sodium, docusate sodium, docusate sodium, duloxetine, fluticasone, gabapentin, gabapentin, glipizide, glucagon, hydrocodone-acetaminophen, hydromorphone, hydroxyzine, insulin aspart, insulin detemir, lidocaine, lisinopril-hydrochlorothiazide, magnesium, meloxicam, metformin, methocarbamol, metoprolol, mometasone, morphine, multivitamin, naproxen, fish oil, omeprazole, oxycodone, oxycodone hcl, oxycodone-acetaminophen, oxygen-helium, pantoprazole, polyethylene glycol powder, promethazine, senna, testosterone cypionate, trazodone, triamcinolone cream, vitamin c, and oxycodone-acetaminophen, and the following Facility-Administered Medications: lidocaine (pf).  Current Outpatient Prescriptions on File Prior to Visit  Medication Sig  . acetaminophen (TYLENOL) 325 MG tablet Take by mouth.  Marland Kitchen albuterol (PROAIR HFA) 108 (90 BASE) MCG/ACT inhaler Inhale 2 puffs into the lungs every 6 (six) hours as needed for wheezing.  Marland Kitchen albuterol (PROAIR HFA) 108 (90 Base) MCG/ACT inhaler   . amLODipine (NORVASC) 5 MG tablet Take 5 mg by mouth daily.   Marland Kitchen amoxicillin (AMOXIL) 875 MG tablet as needed.   Marland Kitchen antiseptic oral rinse (BIOTENE) LIQD 15 mLs by Mouth Rinse route as needed for dry mouth.  Marland Kitchen aspirin 81 MG chewable tablet Chew by mouth.  Marland Kitchen atorvastatin (LIPITOR) 80 MG tablet Take 80 mg by mouth daily.  Marland Kitchen azelastine (ASTELIN) 0.1 % nasal spray Place 1 spray into both nostrils as needed. Use in each nostril as directed   . capsaicin (ZOSTRIX) 0.025 % cream Apply 1 application topically 2 (two) times daily.  . cetirizine (ZYRTEC) 10 MG tablet Take 10 mg by  mouth daily.  . chlorpheniramine (CHLOR-TRIMETON) 4 MG tablet Take 4 mg by mouth every 6 (six) hours as needed for allergies.  . Cholecalciferol (VITAMIN D3) 5000 UNITS TABS Take 1 tablet by mouth daily.  . Clotrimazole 1 % OINT Apply 1 application topically daily.  . diclofenac sodium (VOLTAREN) 1 % GEL Apply topically 2 (two) times daily as needed.  . docusate sodium (COLACE) 100 MG capsule Take 100 mg by mouth daily as needed for mild constipation.  . docusate sodium (STOOL SOFTENER) 100 MG capsule Take by mouth as needed.   . DULoxetine (CYMBALTA) 30 MG capsule Take 30 mg by mouth 2 (two) times daily.  . fluticasone (FLONASE) 50 MCG/ACT nasal spray   . gabapentin (NEURONTIN) 300 MG capsule Take 300 mg by mouth.  . gabapentin (NEURONTIN) 600 MG tablet Take 600 mg by mouth 2 (two) times daily. One capsule in the am and two capsules at night  . glipiZIDE (GLUCOTROL) 10 MG tablet Take 10 mg by mouth.  Marland Kitchen glucagon (GLUCAGON EMERGENCY) 1 MG injection Inject 1 mg into the vein once as needed.  Marland Kitchen HYDROcodone-acetaminophen (VICODIN) 5-500 MG tablet Take by mouth.  Marland Kitchen HYDROmorphone (DILAUDID) 2 MG tablet Take 2 mg by mouth.  . hydrOXYzine (ATARAX/VISTARIL) 25 MG tablet Take 25 mg by mouth every 6 (six) hours as needed for itching.  . insulin aspart (NOVOLOG) 100 UNIT/ML injection Inject 40 Units into the skin 3 (three) times daily before meals.   . insulin detemir (LEVEMIR) 100 UNIT/ML injection Inject 60 Units into the skin 2 (two) times daily.   Marland Kitchen lidocaine (LIDODERM) 5 % Place 1 patch onto the skin daily. Remove & Discard patch within 12 hours or as directed by MD  . lisinopril-hydrochlorothiazide (PRINZIDE,ZESTORETIC) 20-12.5 MG tablet Take by mouth.  . Magnesium 400 MG TABS Take 1 tablet by  mouth daily.  . meloxicam (MOBIC) 15 MG tablet Take 15 mg by mouth.  . metFORMIN (GLUCOPHAGE) 1000 MG tablet Take 1,000 mg by mouth 2 (two) times daily with a meal.  . methocarbamol (ROBAXIN) 500 MG tablet  Take 500 mg by mouth 2 (two) times daily.   . metoprolol (LOPRESSOR) 100 MG tablet Take 100 mg by mouth 2 (two) times daily.  . mometasone (NASONEX) 50 MCG/ACT nasal spray Place 2 sprays into the nose daily.  Marland Kitchen. morphine (MS CONTIN) 15 MG 12 hr tablet Take 15 mg by mouth 3 (three) times daily as needed for pain.   . Multiple Vitamin (MULTIVITAMIN) tablet Take 1 tablet by mouth daily.  . naproxen (NAPROSYN) 500 MG tablet Take 500 mg by mouth 2 (two) times daily with a meal.  . Omega-3 Fatty Acids (FISH OIL) 1000 MG CAPS Take by mouth 2 (two) times daily.  Marland Kitchen. omeprazole (PRILOSEC) 20 MG capsule Take 20 mg by mouth daily.  Marland Kitchen. oxyCODONE (OXY IR/ROXICODONE) 5 MG immediate release tablet Take by mouth.  . Oxycodone HCl 10 MG TABS Take 10 mg by mouth every 6 (six) hours as needed.   Marland Kitchen. oxyCODONE-acetaminophen (PERCOCET/ROXICET) 5-325 MG tablet Take by mouth.  . OXYGEN Inhale 3 L into the lungs at bedtime.  . pantoprazole (PROTONIX) 40 MG tablet Take 40 mg by mouth.  . polyethylene glycol powder (GLYCOLAX/MIRALAX) powder Take by mouth as needed.   . promethazine (PHENERGAN) 12.5 MG tablet Take by mouth.  . senna (SENOKOT) 8.6 MG TABS tablet Take 1 tablet by mouth daily as needed for mild constipation.  Marland Kitchen. testosterone cypionate (DEPOTESTOTERONE CYPIONATE) 200 MG/ML injection Inject 200 mg into the muscle every 14 (fourteen) days.  . traZODone (DESYREL) 150 MG tablet 150 mg daily.   Marland Kitchen. triamcinolone cream (KENALOG) 0.5 % Apply topically 2 (two) times daily.  . vitamin C (ASCORBIC ACID) 500 MG tablet Take 500 mg by mouth daily.  Marland Kitchen. oxyCODONE-acetaminophen (PERCOCET) 7.5-325 MG tablet Take 1 tablet by mouth every 6 (six) hours as needed for moderate pain or severe pain (For post-radiofrequency pain only.).   Current Facility-Administered Medications on File Prior to Visit  Medication  . lidocaine (PF) (XYLOCAINE) 1 % injection 10 mL   ROS  Constitutional: Denies any fever or chills Gastrointestinal: No  reported hemesis, hematochezia, vomiting, or acute GI distress Musculoskeletal: Denies any acute onset joint swelling, redness, loss of ROM, or weakness Neurological: No reported episodes of acute onset apraxia, aphasia, dysarthria, agnosia, amnesia, paralysis, loss of coordination, or loss of consciousness  Allergies  [name] has No Known Allergies.  PFSH  Drug: [name]  reports that he does not use drugs. Alcohol:  reports that he does not drink alcohol. Tobacco:  reports that he has quit smoking. His smoking use included Cigarettes. He has a 3.75 pack-year smoking history. He has quit using smokeless tobacco. Medical:  has a past medical history of Anxiety; Asthma; Atypical chest pain; Back pain; BPH (benign prostatic hypertrophy); Carpal tunnel syndrome; Carpal tunnel syndrome on right (07/17/2015); Chest pain (07/26/2013); DDD (degenerative disc disease); Diabetes mellitus without complication (HCC); Essential hypertension; Falls; GERD (gastroesophageal reflux disease); Heme positive stool; History of bilateral knee surgery (07/17/2015); Hyperlipidemia; Hypertension; Morbid obesity (HCC); OA (osteoarthritis); OSA (obstructive sleep apnea); Ventral hernia; and Wears dentures. Family: family history includes Heart attack in his father.  Past Surgical History:  Procedure Laterality Date  . CARPAL TUNNEL RELEASE    . CATARACT EXTRACTION EXTRACAPSULAR Left 08/04/2015   Procedure:  CATARACT EXTRACTION EXTRACAPSULAR WITH INTRAOCULAR LENS PLACEMENT (IOC);  Surgeon: Sherald Hess, MD;  Location: [facility];  Service: Ophthalmology;  Laterality: Left;  DIABETIC - insulin and oral meds  . CATARACT EXTRACTION W/PHACO Right 04/19/2016   Procedure: CATARACT EXTRACTION PHACO AND INTRAOCULAR LENS PLACEMENT (IOC);  Surgeon: Sherald Hess, MD;  Location: [facility];  Service: Ophthalmology;  Laterality: Right;  RIGHT DIABETIC - insulin and oral meds Sleep apnea   . KNEE SURGERY Bilateral   . NECK SURGERY    . SHOULDER SURGERY Right    Constitutional Exam  General appearance: Well nourished, well developed, and well hydrated. In no apparent acute distress Vitals:   08/16/16 1255  BP: 108/66  Pulse: 79  Resp: 16  Temp: 98.2 F (36.8 C)  TempSrc: Oral  SpO2: 96%  Weight: 282 lb (127.9 kg)  Height: 5\' 9"  (1.753 m)   BMI Assessment: Estimated body mass index is 41.64 kg/m as calculated from the following:   Height as of this encounter: 5\' 9"  (1.753 m).   Weight as of this encounter: 282 lb (127.9 kg).  BMI interpretation table: BMI level Category Range association with higher incidence of chronic pain  <18 kg/m2 Underweight   18.5-24.9 kg/m2 Ideal body weight   25-29.9 kg/m2 Overweight Increased incidence by 20%  30-34.9 kg/m2 Obese (Class I) Increased incidence by 68%  35-39.9 kg/m2 Severe obesity (Class II) Increased incidence by 136%  >40 kg/m2 Extreme obesity (Class III) Increased incidence by 254%   BMI Readings from Last 4 Encounters:  08/16/16 41.64 kg/m  07/08/16 43.12 kg/m  06/29/16 43.12 kg/m  04/19/16 42.53 kg/m   Wt Readings from Last 4 Encounters:  08/16/16 282 lb (127.9 kg)  07/08/16 292 lb (132.5 kg)  06/29/16 292 lb (132.5 kg)  04/19/16 288 lb (130.6 kg)  Psych/Mental status: Alert, oriented x 3 (person, place, & time) Eyes: PERLA Respiratory: No evidence of acute respiratory distress  Cervical Spine Exam  Inspection: No masses, redness, or swelling Alignment: Symmetrical Functional ROM: Unrestricted ROM Stability: No instability detected Muscle strength & Tone: Functionally intact Sensory: Unimpaired Palpation: Non-contributory  Upper Extremity (UE) Exam    Side: Right upper extremity  Side: Left upper extremity  Inspection: No masses, redness, swelling, or asymmetry  Inspection: No masses, redness, swelling, or asymmetry  Functional ROM: Unrestricted ROM          Functional ROM: Unrestricted ROM           Muscle strength & Tone: Functionally intact  Muscle strength & Tone: Functionally intact  Sensory: Unimpaired  Sensory: Unimpaired  Palpation: Non-contributory  Palpation: Non-contributory   Thoracic Spine Exam  Inspection: No masses, redness, or swelling Alignment: Symmetrical Functional ROM: Unrestricted ROM Stability: No instability detected Sensory: Unimpaired Muscle strength & Tone: Functionally intact Palpation: Non-contributory  Lumbar Spine Exam  Inspection: No masses, redness, or swelling Alignment: Symmetrical Functional ROM: Unrestricted ROM Stability: No instability detected Muscle strength & Tone: Functionally intact Sensory: Unimpaired Palpation: Non-contributory Provocative Tests: Lumbar Hyperextension and rotation test: evaluation deferred today       Patrick's Maneuver: evaluation deferred today              Gait & Posture Assessment  Ambulation: Unassisted Gait: Relatively normal for age and body habitus Posture: WNL   Lower Extremity Exam    Side: Right lower extremity  Side: Left lower extremity  Inspection: No masses, redness, swelling, or asymmetry  Inspection: No masses, redness, swelling, or asymmetry  Functional  ROM: Unrestricted ROM          Functional ROM: Unrestricted ROM          Muscle strength & Tone: Functionally intact  Muscle strength & Tone: Functionally intact  Sensory: Unimpaired  Sensory: Unimpaired  Palpation: Non-contributory  Palpation: Non-contributory   Assessment  Primary Diagnosis & Pertinent Problem List: The primary encounter diagnosis was Chronic pain syndrome. 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  Visit Diagnosis: 1. Chronic pain syndrome   2. Chronic low back pain (Location of Primary Source of Pain) (Bilateral) (L>R)   3. Lumbar foraminal  stenosis (bilateral L3-4)   4. Lumbar facet syndrome (Bilateral) (L>R)    Plan of Care  Pharmacotherapy (Medications Ordered): No orders of the defined types were placed in this encounter.  New Prescriptions   No medications on file   Medications administered today: Mr. Hinojos had no medications administered during this visit. Lab-work, procedure(s), and/or referral(s): No orders of the defined types were placed in this encounter.  Imaging and/or referral(s): None  Interventional therapies: Planned, scheduled, and/or pending:   None at this time.    Considering:   Palliative bilateral lumbar facet radiofrequency ablation. (Right side done on 07/08/2016 and 10/02/15 before that. Left side on 12/09/2015)  Right intra-articular shoulder injection under fluoroscopic guidance, without sedation. Possible intra-articular right knee injection under fluoroscopic guidance, no sedation.  Palliative right cervical epidural steroid injection under fluoroscopic guidance, no sedation.  Palliative bilateral genicular nerve RFA.    Palliative PRN treatment(s):   Not at this time.   Provider-requested follow-up: Return in 6 months (on 02/13/2017) for Radiofrequency evaluation.  Future Appointments Date Time Provider Department Center  02/02/2017 1:00 PM Delano Metz, MD [facility] None   Primary Care Physician: Bobbye Morton, MD Location: [facility] Outpatient Pain Management Facility Note by: Sydnee Levans. Laban Emperor, M.D, DABA, DABAPM, DABPM, DABIPP, FIPP  Pain Score Disclaimer: We use the NRS-11 scale. This is a self-reported, subjective measurement of pain severity with only modest accuracy. It is used primarily to identify changes within a particular patient. It must be understood that outpatient pain scales are significantly less accurate that those used for research, where they can be applied under ideal controlled circumstances with minimal exposure to variables. In reality, the score is  likely to be a combination of pain intensity and pain affect, where pain affect describes the degree of emotional arousal or changes in action readiness caused by the sensory experience of pain. Factors such as social and work situation, setting, emotional state, anxiety levels, expectation, and prior pain experience may influence pain perception and show large inter-individual differences that may also be affected by time variables.  Patient instructions provided during this appointment: Patient Instructions   GENERAL RISKS AND COMPLICATIONS  What are the risk, side effects and possible complications? Generally speaking, most procedures are safe.  However, with any procedure there are risks, side effects, and the possibility of complications.  The risks and complications are dependent upon the sites that are lesioned, or the type of nerve block to be performed.  The closer the procedure is to the spine, the more serious the risks are.  Great care is taken when placing the radio frequency needles, block needles or lesioning probes, but sometimes complications can occur. 1. Infection: Any time there is an injection through the skin, there is a  risk of infection.  This is why sterile conditions are used for these blocks.  There are four possible types of infection. 1. Localized skin infection. 2. Central Nervous System Infection-This can be in the form of Meningitis, which can be deadly. 3. Epidural Infections-This can be in the form of an epidural abscess, which can cause pressure inside of the spine, causing compression of the spinal cord with subsequent paralysis. This would require an emergency surgery to decompress, and there are no guarantees that the patient would recover from the paralysis. 4. Discitis-This is an infection of the intervertebral discs.  It occurs in about 1% of discography procedures.  It is difficult to treat and it may lead to surgery.        2. Pain: the needles have to go  through skin and soft tissues, will cause soreness.       3. Damage to internal structures:  The nerves to be lesioned may be near blood vessels or    other nerves which can be potentially damaged.       4. Bleeding: Bleeding is more common if the patient is taking blood thinners such as  aspirin, Coumadin, Ticiid, Plavix, etc., or if he/she have some genetic predisposition  such as hemophilia. Bleeding into the spinal canal can cause compression of the spinal  cord with subsequent paralysis.  This would require an emergency surgery to  decompress and there are no guarantees that the patient would recover from the  paralysis.       5. Pneumothorax:  Puncturing of a lung is a possibility, every time a needle is introduced in  the area of the chest or upper back.  Pneumothorax refers to free air around the  collapsed lung(s), inside of the thoracic cavity (chest cavity).  Another two possible  complications related to a similar event would include: Hemothorax and Chylothorax.   These are variations of the Pneumothorax, where instead of air around the collapsed  lung(s), you may have blood or chyle, respectively.       6. Spinal headaches: They may occur with any procedures in the area of the spine.       7. Persistent CSF (Cerebro-Spinal Fluid) leakage: This is a rare problem, but may occur  with prolonged intrathecal or epidural catheters either due to the formation of a fistulous  track or a dural tear.       8. Nerve damage: By working so close to the spinal cord, there is always a possibility of  nerve damage, which could be as serious as a permanent spinal cord injury with  paralysis.       9. Death:  Although rare, severe deadly allergic reactions known as "Anaphylactic  reaction" can occur to any of the medications used.      10. Worsening of the symptoms:  We can always make thing worse.  What are the chances of something like this happening? Chances of any of this occuring are extremely low.  By  statistics, you have more of a chance of getting killed in a motor vehicle accident: while driving to the hospital than any of the above occurring .  Nevertheless, you should be aware that they are possibilities.  In general, it is similar to taking a shower.  Everybody knows that you can slip, hit your head and get killed.  Does that mean that you should not shower again?  Nevertheless always keep in mind that statistics do not mean anything if you happen  to be on the wrong side of them.  Even if a procedure has a 1 (one) in a 1,000,000 (million) chance of going wrong, it you happen to be that one..Also, keep in mind that by statistics, you have more of a chance of having something go wrong when taking medications.  Who should not have this procedure? If you are on a blood thinning medication (e.g. Coumadin, Plavix, see list of "Blood Thinners"), or if you have an active infection going on, you should not have the procedure.  If you are taking any blood thinners, please inform your physician.  How should I prepare for this procedure?  Do not eat or drink anything at least six hours prior to the procedure.  Bring a driver with you .  It cannot be a taxi.  Come accompanied by an adult that can drive you back, and that is strong enough to help you if your legs get weak or numb from the local anesthetic.  Take all of your medicines the morning of the procedure with just enough water to swallow them.  If you have diabetes, make sure that you are scheduled to have your procedure done first thing in the morning, whenever possible.  If you have diabetes, take only half of your insulin dose and notify our nurse that you have done so as soon as you arrive at the clinic.  If you are diabetic, but only take blood sugar pills (oral hypoglycemic), then do not take them on the morning of your procedure.  You may take them after you have had the procedure.  Do not take aspirin or any aspirin-containing  medications, at least eleven (11) days prior to the procedure.  They may prolong bleeding.  Wear loose fitting clothing that may be easy to take off and that you would not mind if it got stained with Betadine or blood.  Do not wear any jewelry or perfume  Remove any nail coloring.  It will interfere with some of our monitoring equipment.  NOTE: Remember that this is not meant to be interpreted as a complete list of all possible complications.  Unforeseen problems may occur.  BLOOD THINNERS The following drugs contain aspirin or other products, which can cause increased bleeding during surgery and should not be taken for 2 weeks prior to and 1 week after surgery.  If you should need take something for relief of minor pain, you may take acetaminophen which is found in Tylenol,m Datril, Anacin-3 and Panadol. It is not blood thinner. The products listed below are.  Do not take any of the products listed below in addition to any listed on your instruction sheet.  A.P.C or A.P.C with Codeine Codeine Phosphate Capsules #3 Ibuprofen Ridaura  ABC compound Congesprin Imuran rimadil  Advil Cope Indocin Robaxisal  Alka-Seltzer Effervescent Pain Reliever and Antacid Coricidin or Coricidin-D  Indomethacin Rufen  Alka-Seltzer plus Cold Medicine Cosprin Ketoprofen S-A-C Tablets  Anacin Analgesic Tablets or Capsules Coumadin Korlgesic Salflex  Anacin Extra Strength Analgesic tablets or capsules CP-2 Tablets Lanoril Salicylate  Anaprox Cuprimine Capsules Levenox Salocol  Anexsia-D Dalteparin Magan Salsalate  Anodynos Darvon compound Magnesium Salicylate Sine-off  Ansaid Dasin Capsules Magsal Sodium Salicylate  Anturane Depen Capsules Marnal Soma  APF Arthritis pain formula Dewitt's Pills Measurin Stanback  Argesic Dia-Gesic Meclofenamic Sulfinpyrazone  Arthritis Bayer Timed Release Aspirin Diclofenac Meclomen Sulindac  Arthritis pain formula Anacin Dicumarol Medipren Supac  Analgesic (Safety coated)  Arthralgen Diffunasal Mefanamic Suprofen  Arthritis Strength Bufferin Dihydrocodeine Mepro  Compound Suprol  Arthropan liquid Dopirydamole Methcarbomol with Aspirin Synalgos  ASA tablets/Enseals Disalcid Micrainin Tagament  Ascriptin Doan's Midol Talwin  Ascriptin A/D Dolene Mobidin Tanderil  Ascriptin Extra Strength Dolobid Moblgesic Ticlid  Ascriptin with Codeine Doloprin or Doloprin with Codeine Momentum Tolectin  Asperbuf Duoprin Mono-gesic Trendar  Aspergum Duradyne Motrin or Motrin IB Triminicin  Aspirin plain, buffered or enteric coated Durasal Myochrisine Trigesic  Aspirin Suppositories Easprin Nalfon Trillsate  Aspirin with Codeine Ecotrin Regular or Extra Strength Naprosyn Uracel  Atromid-S Efficin Naproxen Ursinus  Auranofin Capsules Elmiron Neocylate Vanquish  Axotal Emagrin Norgesic Verin  Azathioprine Empirin or Empirin with Codeine Normiflo Vitamin E  Azolid Emprazil Nuprin Voltaren  Bayer Aspirin plain, buffered or children's or timed BC Tablets or powders Encaprin Orgaran Warfarin Sodium  Buff-a-Comp Enoxaparin Orudis Zorpin  Buff-a-Comp with Codeine Equegesic Os-Cal-Gesic   Buffaprin Excedrin plain, buffered or Extra Strength Oxalid   Bufferin Arthritis Strength Feldene Oxphenbutazone   Bufferin plain or Extra Strength Feldene Capsules Oxycodone with Aspirin   Bufferin with Codeine Fenoprofen Fenoprofen Pabalate or Pabalate-SF   Buffets II Flogesic Panagesic   Buffinol plain or Extra Strength Florinal or Florinal with Codeine Panwarfarin   Buf-Tabs Flurbiprofen Penicillamine   Butalbital Compound Four-way cold tablets Penicillin   Butazolidin Fragmin Pepto-Bismol   Carbenicillin Geminisyn Percodan   Carna Arthritis Reliever Geopen Persantine   Carprofen Gold's salt Persistin   Chloramphenicol Goody's Phenylbutazone   Chloromycetin Haltrain Piroxlcam   Clmetidine heparin Plaquenil   Cllnoril Hyco-pap Ponstel   Clofibrate Hydroxy chloroquine Propoxyphen          Before stopping any of these medications, be sure to consult the physician who ordered them.  Some, such as Coumadin (Warfarin) are ordered to prevent or treat serious conditions such as "deep thrombosis", "pumonary embolisms", and other heart problems.  The amount of time that you may need off of the medication may also vary with the medication and the reason for which you were taking it.  If you are taking any of these medications, please make sure you notify your pain physician before you undergo any procedures.         Radiofrequency Lesioning Introduction Radiofrequency lesioning is a procedure that is performed to relieve pain. The procedure is often used for back, neck, or arm pain. Radiofrequency lesioning involves the use of a machine that creates radio waves to make heat. During the procedure, the heat is applied to the nerve that carries the pain signal. The heat damages the nerve and interferes with the pain signal. Pain relief usually starts about 2 weeks after the procedure and lasts for 6 months to 1 year. Tell a health care provider about:  Any allergies you have.  All medicines you are taking, including vitamins, herbs, eye drops, creams, and over-the-counter medicines.  Any problems you or family members have had with anesthetic medicines.  Any blood disorders you have.  Any surgeries you have had.  Any medical conditions you have.  Whether you are pregnant or may be pregnant. What are the risks? Generally, this is a safe procedure. However, problems may occur, including:  Pain or soreness at the injection site.  Infection at the injection site.  Damage to nerves or blood vessels. What happens before the procedure?  Ask your health care provider about:  Changing or stopping your regular medicines. This is especially important if you are taking diabetes medicines or blood thinners.  Taking medicines such as aspirin and ibuprofen. These medicines can thin  your blood. Do not take these medicines before your procedure if your health care provider instructs you not to.  Follow instructions from your health care provider about eating or drinking restrictions.  Plan to have someone take you home after the procedure.  If you go home right after the procedure, plan to have someone with you for 24 hours. What happens during the procedure?  You will be given one or more of the following:  A medicine to help you relax (sedative).  A medicine to numb the area (local anesthetic).  You will be awake during the procedure. You will need to be able to talk with the health care provider during the procedure.  With the help of a type of X-ray (fluoroscopy), the health care provider will insert a radiofrequency needle into the area to be treated.  Next, a wire that carries the radio waves (electrode) will be put through the radiofrequency needle. An electrical pulse will be sent through the electrode to verify the correct nerve. You will feel a tingling sensation, and you may have muscle twitching.  Then, the tissue that is around the needle tip will be heated by an electric current that is passed using the radiofrequency machine. This will numb the nerves.  A bandage (dressing) will be put on the insertion area after the procedure is done. The procedure may vary among health care providers and hospitals. What happens after the procedure?  Your blood pressure, heart rate, breathing rate, and blood oxygen level will be monitored often until the medicines you were given have worn off.  Return to your normal activities as directed by your health care provider. This information is not intended to replace advice given to you by your health care provider. Make sure you discuss any questions you have with your health care provider. Document Released: 05/05/2011 Document Revised: 02/12/2016 Document Reviewed: 10/14/2014  2017 [employer]vier

## 2017-01-20 ENCOUNTER — Encounter: Payer: Self-pay | Admitting: Cardiovascular Disease

## 2017-01-20 ENCOUNTER — Ambulatory Visit (INDEPENDENT_AMBULATORY_CARE_PROVIDER_SITE_OTHER): Payer: Medicare (Managed Care) | Admitting: Cardiovascular Disease

## 2017-01-20 VITALS — BP 104/64 | HR 80 | Ht 69.0 in | Wt 298.8 lb

## 2017-01-20 DIAGNOSIS — I10 Essential (primary) hypertension: Secondary | ICD-10-CM

## 2017-01-20 DIAGNOSIS — E785 Hyperlipidemia, unspecified: Secondary | ICD-10-CM

## 2017-01-20 NOTE — Patient Instructions (Signed)
Medication Instructions: No changes.   Labwork: None.   Procedures/Testing: None.   Follow-Up: 1 year with Dr. Beckey Polkowski.   Any Additional Special Instructions Will Be Listed Below (If Applicable).     If you need a refill on your cardiac medications before your next appointment, please call your pharmacy.   

## 2017-01-20 NOTE — Progress Notes (Signed)
Cardiology Office Note   Date:  01/20/2017   ID:  James Friedman, DOB 22-Dec-1945, MRN [id number]  PCP:  Bobbye Morton, MD  Cardiologist:   Lorine Bears, MD   Chief Complaint  Patient presents with  . other    69mo f/u. Pt  Reviewed meds with pt verbally.      History of Present Illness: James Friedman is a 71 y.o. male who presents for follow-up visit regarding chronic atypical chest pain and risk factors for coronary artery disease including hypertension, hyperlipidemia, diabetes, sleep apnea and obesity. He suffers from severe arthritis. He had right shoulder surgery done 3 times in the past.  Nuclear stress test in November 2014 was normal. He has been stable with no new cardiac symptoms. No recent chest pain. He has chronic exertional dyspnea. He has sleep apnea but is not able to wear CPAP. He uses nighttime oxygen.  Past Medical History:  Diagnosis Date  . Anxiety   . Asthma   . Atypical chest pain    a. 2014 Neg MV.  . Back pain   . BPH (benign prostatic hypertrophy)   . Carpal tunnel syndrome   . Carpal tunnel syndrome on right 07/17/2015  . Chest pain 07/26/2013  . DDD (degenerative disc disease)   . Diabetes mellitus without complication (HCC)   . Essential hypertension   . Falls   . GERD (gastroesophageal reflux disease)   . Heme positive stool   . History of bilateral knee surgery 07/17/2015  . Hyperlipidemia   . Hypertension   . Morbid obesity (HCC)   . OA (osteoarthritis)    knees  . OSA (obstructive sleep apnea)    CPAP  . Ventral hernia   . Wears dentures    full upper and lower    Past Surgical History:  Procedure Laterality Date  . CARPAL TUNNEL RELEASE    . CATARACT EXTRACTION EXTRACAPSULAR Left 08/04/2015   Procedure: CATARACT EXTRACTION EXTRACAPSULAR WITH INTRAOCULAR LENS PLACEMENT (IOC);  Surgeon: Sherald Hess, MD;  Location: [facility];  Service: Ophthalmology;  Laterality: Left;  DIABETIC - insulin and oral  meds  . CATARACT EXTRACTION W/PHACO Right 04/19/2016   Procedure: CATARACT EXTRACTION PHACO AND INTRAOCULAR LENS PLACEMENT (IOC);  Surgeon: Sherald Hess, MD;  Location: [facility];  Service: Ophthalmology;  Laterality: Right;  RIGHT DIABETIC - insulin and oral meds Sleep apnea  . KNEE SURGERY Bilateral   . NECK SURGERY    . SHOULDER SURGERY Right      Current Outpatient Prescriptions  Medication Sig Dispense Refill  . acetaminophen (TYLENOL) 325 MG tablet Take by mouth.    Marland Kitchen albuterol (PROAIR HFA) 108 (90 BASE) MCG/ACT inhaler Inhale 2 puffs into the lungs every 6 (six) hours as needed for wheezing.    Marland Kitchen albuterol (PROAIR HFA) 108 (90 Base) MCG/ACT inhaler     . amLODipine (NORVASC) 5 MG tablet Take 5 mg by mouth daily.     Marland Kitchen antiseptic oral rinse (BIOTENE) LIQD 15 mLs by Mouth Rinse route as needed for dry mouth.    Marland Kitchen aspirin 81 MG chewable tablet Chew by mouth.    Marland Kitchen atorvastatin (LIPITOR) 80 MG tablet Take 80 mg by mouth daily.    Marland Kitchen azelastine (ASTELIN) 0.1 % nasal spray Place 1 spray into both nostrils as needed. Use in each nostril as directed     . capsaicin (ZOSTRIX) 0.025 % cream Apply 1 application topically 2 (two) times daily.    Marland Kitchen  cetirizine (ZYRTEC) 10 MG tablet Take 10 mg by mouth daily.    . chlorpheniramine (CHLOR-TRIMETON) 4 MG tablet Take 4 mg by mouth every 6 (six) hours as needed for allergies.    . Cholecalciferol (VITAMIN D3) 5000 UNITS TABS Take 1 tablet by mouth daily.    . Clotrimazole 1 % OINT Apply 1 application topically daily.    . diclofenac sodium (VOLTAREN) 1 % GEL Apply topically 2 (two) times daily as needed.    . docusate sodium (COLACE) 100 MG capsule Take 100 mg by mouth daily as needed for mild constipation.    . docusate sodium (STOOL SOFTENER) 100 MG capsule Take by mouth as needed.     . DULoxetine (CYMBALTA) 30 MG capsule Take 30 mg by mouth 2 (two) times daily.    . fluticasone (FLONASE) 50 MCG/ACT nasal spray     .  gabapentin (NEURONTIN) 300 MG capsule Take 300 mg by mouth.    . gabapentin (NEURONTIN) 600 MG tablet Take 600 mg by mouth 2 (two) times daily. One capsule in the am and two capsules at night    . glipiZIDE (GLUCOTROL) 10 MG tablet Take 10 mg by mouth.    Marland Kitchen. glucagon (GLUCAGON EMERGENCY) 1 MG injection Inject 1 mg into the vein once as needed.    . hydrOXYzine (ATARAX/VISTARIL) 25 MG tablet Take 25 mg by mouth every 6 (six) hours as needed for itching.    . insulin aspart (NOVOLOG) 100 UNIT/ML injection Inject 40 Units into the skin 3 (three) times daily before meals.     . insulin detemir (LEVEMIR) 100 UNIT/ML injection Inject 60 Units into the skin 2 (two) times daily.     Marland Kitchen. lidocaine (LIDODERM) 5 % Place 1 patch onto the skin daily. Remove & Discard patch within 12 hours or as directed by MD    . lisinopril-hydrochlorothiazide (PRINZIDE,ZESTORETIC) 20-12.5 MG tablet Take by mouth.    . Magnesium 400 MG TABS Take 1 tablet by mouth daily.    . meloxicam (MOBIC) 15 MG tablet Take 15 mg by mouth.    . metFORMIN (GLUCOPHAGE) 1000 MG tablet Take 1,000 mg by mouth 2 (two) times daily with a meal.    . methocarbamol (ROBAXIN) 500 MG tablet Take 500 mg by mouth 2 (two) times daily.     . metoprolol (LOPRESSOR) 100 MG tablet Take 100 mg by mouth 2 (two) times daily.    . mometasone (NASONEX) 50 MCG/ACT nasal spray Place 2 sprays into the nose daily.    Marland Kitchen. morphine (MS CONTIN) 15 MG 12 hr tablet Take 15 mg by mouth 3 (three) times daily as needed for pain.     . Multiple Vitamin (MULTIVITAMIN) tablet Take 1 tablet by mouth daily.    . naproxen (NAPROSYN) 500 MG tablet Take 500 mg by mouth 2 (two) times daily with a meal.    . Omega-3 Fatty Acids (FISH OIL) 1000 MG CAPS Take by mouth 2 (two) times daily.    Marland Kitchen. omeprazole (PRILOSEC) 20 MG capsule Take 20 mg by mouth daily.    . Oxycodone HCl 10 MG TABS Take 10 mg by mouth every 6 (six) hours as needed.     . OXYGEN Inhale 3 L into the lungs at bedtime.    .  pantoprazole (PROTONIX) 40 MG tablet Take 40 mg by mouth.    . polyethylene glycol powder (GLYCOLAX/MIRALAX) powder Take by mouth as needed.     . promethazine (PHENERGAN) 12.5 MG tablet Take  by mouth.    . senna (SENOKOT) 8.6 MG TABS tablet Take 1 tablet by mouth daily as needed for mild constipation.    Marland Kitchen testosterone cypionate (DEPOTESTOTERONE CYPIONATE) 200 MG/ML injection Inject 200 mg into the muscle every 14 (fourteen) days.    . traZODone (DESYREL) 150 MG tablet 150 mg daily.     Marland Kitchen triamcinolone cream (KENALOG) 0.5 % Apply topically 2 (two) times daily.    . vitamin C (ASCORBIC ACID) 500 MG tablet Take 500 mg by mouth daily.     Current Facility-Administered Medications  Medication Dose Route Frequency Provider Last Rate Last Dose  . lidocaine (PF) (XYLOCAINE) 1 % injection 10 mL  10 mL Other Once Delano Metz, MD        Allergies:   Patient has no known allergies.    Social History:  The patient  reports that he has quit smoking. His smoking use included Cigarettes. He has a 3.75 pack-year smoking history. His smokeless tobacco use includes Chew. He reports that he does not drink alcohol or use drugs.   Family History:  The patient's family history includes Heart attack in his father.    ROS:  Please see the history of present illness.   Otherwise, review of systems are positive for none.   All other systems are reviewed and negative.    PHYSICAL EXAM: VS:  BP 104/64   Pulse 80   Ht 5\' 9"  (1.753 m)   Wt 298 lb 12 oz (135.5 kg)   BMI 44.12 kg/m  , BMI Body mass index is 44.12 kg/m. GEN: Well nourished, well developed, in no acute distress  HEENT: normal  Neck: no JVD, carotid bruits, or masses Cardiac: RRR; no murmurs, rubs, or gallops,no edema  Respiratory:  clear to auscultation bilaterally, normal work of breathing GI: soft, nontender, nondistended, + BS MS: no deformity or atrophy  Skin: warm and dry, no rash Neuro:  Strength and sensation are intact Psych:  euthymic mood, full affect   EKG:  EKG is ordered today. The ekg ordered today demonstrates normal sinus rhythm with no significant ST or T wave changes.   Recent Labs: No results found for requested labs within last 8760 hours.    Lipid Panel No results found for: CHOL, TRIG, HDL, CHOLHDL, VLDL, LDLCALC, LDLDIRECT    Wt Readings from Last 3 Encounters:  01/20/17 298 lb 12 oz (135.5 kg)  08/16/16 282 lb (127.9 kg)  07/08/16 292 lb (132.5 kg)       No flowsheet data found.    ASSESSMENT AND PLAN:  1.  History of atypical chest pain: Currently with no symptoms. Continue treatment of risk factors. Negative stress test in 2014. EKG is normal.  2. Essential hypertension: Blood pressure is controlled.  3. Hyperlipidemia: Continue treatment with atorvastatin with a target LDL of less than 70 given diabetes.  4. Morbid obesity: Activities are limited by severe arthritis. His weight has been relatively stable.    Disposition:   FU with me in 1 year  Signed,  Lorine Bears, MD  01/20/2017 9:04 AM    [facility]

## 2017-02-02 ENCOUNTER — Encounter: Payer: Self-pay | Admitting: Pain Medicine

## 2017-02-02 ENCOUNTER — Ambulatory Visit: Payer: Medicare (Managed Care) | Attending: Pain Medicine | Admitting: Pain Medicine

## 2017-02-02 VITALS — BP 126/65 | HR 79 | Temp 98.7°F | Resp 16 | Ht 69.0 in | Wt 282.0 lb

## 2017-02-02 DIAGNOSIS — M25511 Pain in right shoulder: Secondary | ICD-10-CM | POA: Diagnosis not present

## 2017-02-02 DIAGNOSIS — Z6841 Body Mass Index (BMI) 40.0 and over, adult: Secondary | ICD-10-CM | POA: Diagnosis not present

## 2017-02-02 DIAGNOSIS — M79605 Pain in left leg: Secondary | ICD-10-CM | POA: Insufficient documentation

## 2017-02-02 DIAGNOSIS — E119 Type 2 diabetes mellitus without complications: Secondary | ICD-10-CM | POA: Insufficient documentation

## 2017-02-02 DIAGNOSIS — M19011 Primary osteoarthritis, right shoulder: Secondary | ICD-10-CM | POA: Insufficient documentation

## 2017-02-02 DIAGNOSIS — G4733 Obstructive sleep apnea (adult) (pediatric): Secondary | ICD-10-CM | POA: Diagnosis not present

## 2017-02-02 DIAGNOSIS — Z79891 Long term (current) use of opiate analgesic: Secondary | ICD-10-CM | POA: Insufficient documentation

## 2017-02-02 DIAGNOSIS — E785 Hyperlipidemia, unspecified: Secondary | ICD-10-CM | POA: Insufficient documentation

## 2017-02-02 DIAGNOSIS — M170 Bilateral primary osteoarthritis of knee: Secondary | ICD-10-CM | POA: Diagnosis not present

## 2017-02-02 DIAGNOSIS — M542 Cervicalgia: Secondary | ICD-10-CM | POA: Diagnosis not present

## 2017-02-02 DIAGNOSIS — Z794 Long term (current) use of insulin: Secondary | ICD-10-CM | POA: Diagnosis not present

## 2017-02-02 DIAGNOSIS — M25561 Pain in right knee: Secondary | ICD-10-CM

## 2017-02-02 DIAGNOSIS — F17210 Nicotine dependence, cigarettes, uncomplicated: Secondary | ICD-10-CM | POA: Diagnosis not present

## 2017-02-02 DIAGNOSIS — G8929 Other chronic pain: Secondary | ICD-10-CM | POA: Diagnosis not present

## 2017-02-02 DIAGNOSIS — M25562 Pain in left knee: Secondary | ICD-10-CM

## 2017-02-02 DIAGNOSIS — M19012 Primary osteoarthritis, left shoulder: Secondary | ICD-10-CM | POA: Diagnosis not present

## 2017-02-02 DIAGNOSIS — Z7982 Long term (current) use of aspirin: Secondary | ICD-10-CM | POA: Insufficient documentation

## 2017-02-02 DIAGNOSIS — K219 Gastro-esophageal reflux disease without esophagitis: Secondary | ICD-10-CM | POA: Insufficient documentation

## 2017-02-02 DIAGNOSIS — I10 Essential (primary) hypertension: Secondary | ICD-10-CM | POA: Diagnosis not present

## 2017-02-02 DIAGNOSIS — M545 Low back pain, unspecified: Secondary | ICD-10-CM

## 2017-02-02 DIAGNOSIS — M4696 Unspecified inflammatory spondylopathy, lumbar region: Secondary | ICD-10-CM

## 2017-02-02 DIAGNOSIS — Z79899 Other long term (current) drug therapy: Secondary | ICD-10-CM | POA: Insufficient documentation

## 2017-02-02 DIAGNOSIS — M47816 Spondylosis without myelopathy or radiculopathy, lumbar region: Secondary | ICD-10-CM

## 2017-02-02 DIAGNOSIS — M25571 Pain in right ankle and joints of right foot: Secondary | ICD-10-CM | POA: Diagnosis not present

## 2017-02-02 DIAGNOSIS — M25572 Pain in left ankle and joints of left foot: Secondary | ICD-10-CM | POA: Diagnosis not present

## 2017-02-02 DIAGNOSIS — M79604 Pain in right leg: Secondary | ICD-10-CM | POA: Insufficient documentation

## 2017-02-02 DIAGNOSIS — M25512 Pain in left shoulder: Secondary | ICD-10-CM

## 2017-02-02 NOTE — Patient Instructions (Addendum)
Pain Score  Introduction: The pain score used by this practice is the Verbal Numerical Rating Scale (VNRS-11). This is an 11-point scale. It is for adults and children 10 years or older. There are significant differences in how the pain score is reported, used, and applied. Forget everything you learned in the past and learn this scoring system.  General Information: The scale should reflect your current level of pain. Unless you are specifically asked for the level of your worst pain, or your average pain. If you are asked for one of these two, then it should be understood that it is over the past 24 hours.  Basic Activities of Daily Living (ADL): Personal hygiene, dressing, eating, transferring, and using restroom.  Instructions: Most patients tend to report their level of pain as a combination of two factors, their physical pain and their psychosocial pain. This last one is also known as "suffering" and it is reflection of how physical pain affects you socially and psychologically. From now on, report them separately. From this point on, when asked to report your pain level, report only your physical pain. Use the following table for reference.  Pain Clinic Pain Levels (0-5/10)  Pain Level Score Description  No Pain 0   Mild pain 1 Nagging, annoying, but does not interfere with basic activities of daily living (ADL). Patients are able to eat, bathe, get dressed, toileting (being able to get on and off the toilet and perform personal hygiene functions), transfer (move in and out of bed or a chair without assistance), and maintain continence (able to control bladder and bowel functions). Blood pressure and heart rate are unaffected. A normal heart rate for a healthy adult ranges from 60 to 100 bpm (beats per minute).   Mild to moderate pain 2 Noticeable and distracting. Impossible to hide from other people. More frequent flare-ups. Still possible to adapt and function close to normal. It can be very  annoying and may have occasional stronger flare-ups. With discipline, patients may get used to it and adapt.   Moderate pain 3 Interferes significantly with activities of daily living (ADL). It becomes difficult to feed, bathe, get dressed, get on and off the toilet or to perform personal hygiene functions. Difficult to get in and out of bed or a chair without assistance. Very distracting. With effort, it can be ignored when deeply involved in activities.   Moderately severe pain 4 Impossible to ignore for more than a few minutes. With effort, patients may still be able to manage work or participate in some social activities. Very difficult to concentrate. Signs of autonomic nervous system discharge are evident: dilated pupils (mydriasis); mild sweating (diaphoresis); sleep interference. Heart rate becomes elevated (>115 bpm). Diastolic blood pressure (lower number) rises above 100 mmHg. Patients find relief in laying down and not moving.   Severe pain 5 Intense and extremely unpleasant. Associated with frowning face and frequent crying. Pain overwhelms the senses.  Ability to do any activity or maintain social relationships becomes significantly limited. Conversation becomes difficult. Pacing back and forth is common, as getting into a comfortable position is nearly impossible. Pain wakes you up from deep sleep. Physical signs will be obvious: pupillary dilation; increased sweating; goosebumps; brisk reflexes; cold, clammy hands and feet; nausea, vomiting or dry heaves; loss of appetite; significant sleep disturbance with inability to fall asleep or to remain asleep. When persistent, significant weight loss is observed due to the complete loss of appetite and sleep deprivation.  Blood pressure and heart   rate becomes significantly elevated. Caution: If elevated blood pressure triggers a pounding headache associated with blurred vision, then the patient should immediately seek attention at an urgent or  emergency care unit, as these may be signs of an impending stroke.    Emergency Department Pain Levels (6-10/10)  Emergency Room Pain 6 Severely limiting. Requires emergency care and should not be seen or managed at an outpatient pain management facility. Communication becomes difficult and requires great effort. Assistance to reach the emergency department may be required. Facial flushing and profuse sweating along with potentially dangerous increases in heart rate and blood pressure will be evident.   Distressing pain 7 Self-care is very difficult. Assistance is required to transport, or use restroom. Assistance to reach the emergency department will be required. Tasks requiring coordination, such as bathing and getting dressed become very difficult.   Disabling pain 8 Self-care is no longer possible. At this level, pain is disabling. The individual is unable to do even the most "basic" activities such as walking, eating, bathing, dressing, transferring to a bed, or toileting. Fine motor skills are lost. It is difficult to think clearly.   Incapacitating pain 9 Pain becomes incapacitating. Thought processing is no longer possible. Difficult to remember your own name. Control of movement and coordination are lost.   The worst pain imaginable 10 At this level, most patients pass out from pain. When this level is reached, collapse of the autonomic nervous system occurs, leading to a sudden drop in blood pressure and heart rate. This in turn results in a temporary and dramatic drop in blood flow to the brain, leading to a loss of consciousness. Fainting is one of the body's self defense mechanisms. Passing out puts the brain in a calmed state and causes it to shut down for a while, in order to begin the healing process.    Summary: 1. Refer to this scale when providing [place] with your pain level. 2. Be accurate and careful when reporting your pain level. This will help with your care. 3. Over-reporting  your pain level will lead to loss of credibility. 4. Even a level of 1/10 means that there is pain and will be treated at our facility. 5. High, inaccurate reporting will be documented as "Symptom Exaggeration", leading to loss of credibility and suspicions of possible secondary gains such as obtaining more narcotics, or wanting to appear disabled, for fraudulent reasons. 6. Only pain levels of 5 or below will be seen at our facility. 7. Pain levels of 6 and above will be sent to the Emergency Department and the appointment cancelled. _____________________________________________________________________________________________  Preparing for your procedure (without sedation) Instructions: . Oral Intake: Do not eat or drink anything for at least 3 hours prior to your procedure. . Transportation: Unless otherwise stated by your physician, you may drive yourself after the procedure. . Blood Pressure Medicine: Take your blood pressure medicine with a sip of water the morning of the procedure. . Blood thinners:  . Diabetics on insulin: Notify the staff so that you can be scheduled 1st case in the morning. If your diabetes requires high dose insulin, take only  of your normal insulin dose the morning of the procedure and notify the staff that you have done so. . Preventing infections: Shower with an antibacterial soap the morning of your procedure.  . Build-up your immune system: Take 1000 mg of Vitamin C with every meal (3 times a day) the day prior to your procedure. Marland Kitchen Antibiotics: Inform the staff  if you have a condition or reason that requires you to take antibiotics before dental procedures. . Pregnancy: If you are pregnant, call and cancel the procedure. . Sickness: If you have a cold, fever, or any active infections, call and cancel the procedure. . Arrival: You must be in the facility at least 30 minutes prior to your scheduled procedure. . Children: Do not bring any children with  you. . Dress appropriately: Bring dark clothing that you would not mind if they get stained. . Valuables: Do not bring any jewelry or valuables. Procedure appointments are reserved for interventional treatments only. Marland Kitchen. No Prescription Refills. . No medication changes will be discussed during procedure appointments. . No disability issues will be discussed. _____________________________________________________________________________________________  GENERAL RISKS AND COMPLICATIONS  What are the risk, side effects and possible complications? Generally speaking, most procedures are safe.  However, with any procedure there are risks, side effects, and the possibility of complications.  The risks and complications are dependent upon the sites that are lesioned, or the type of nerve block to be performed.  The closer the procedure is to the spine, the more serious the risks are.  Great care is taken when placing the radio frequency needles, block needles or lesioning probes, but sometimes complications can occur. 1. Infection: Any time there is an injection through the skin, there is a risk of infection.  This is why sterile conditions are used for these blocks.  There are four possible types of infection. 1. Localized skin infection. 2. Central Nervous System Infection-This can be in the form of Meningitis, which can be deadly. 3. Epidural Infections-This can be in the form of an epidural abscess, which can cause pressure inside of the spine, causing compression of the spinal cord with subsequent paralysis. This would require an emergency surgery to decompress, and there are no guarantees that the patient would recover from the paralysis. 4. Discitis-This is an infection of the intervertebral discs.  It occurs in about 1% of discography procedures.  It is difficult to treat and it may lead to surgery.        2. Pain: the needles have to go through skin and soft tissues, will cause soreness.        3. Damage to internal structures:  The nerves to be lesioned may be near blood vessels or    other nerves which can be potentially damaged.       4. Bleeding: Bleeding is more common if the patient is taking blood thinners such as  aspirin, Coumadin, Ticiid, Plavix, etc., or if he/she have some genetic predisposition  such as hemophilia. Bleeding into the spinal canal can cause compression of the spinal  cord with subsequent paralysis.  This would require an emergency surgery to  decompress and there are no guarantees that the patient would recover from the  paralysis.       5. Pneumothorax:  Puncturing of a lung is a possibility, every time a needle is introduced in  the area of the chest or upper back.  Pneumothorax refers to free air around the  collapsed lung(s), inside of the thoracic cavity (chest cavity).  Another two possible  complications related to a similar event would include: Hemothorax and Chylothorax.   These are variations of the Pneumothorax, where instead of air around the collapsed  lung(s), you may have blood or chyle, respectively.       6. Spinal headaches: They may occur with any procedures in the area of the spine.  7. Persistent CSF (Cerebro-Spinal Fluid) leakage: This is a rare problem, but may occur  with prolonged intrathecal or epidural catheters either due to the formation of a fistulous  track or a dural tear.       8. Nerve damage: By working so close to the spinal cord, there is always a possibility of  nerve damage, which could be as serious as a permanent spinal cord injury with  paralysis.       9. Death:  Although rare, severe deadly allergic reactions known as "Anaphylactic  reaction" can occur to any of the medications used.      10. Worsening of the symptoms:  We can always make thing worse.  What are the chances of something like this happening? Chances of any of this occuring are extremely low.  By statistics, you have more of a chance of getting killed in a  motor vehicle accident: while driving to the hospital than any of the above occurring .  Nevertheless, you should be aware that they are possibilities.  In general, it is similar to taking a shower.  Everybody knows that you can slip, hit your head and get killed.  Does that mean that you should not shower again?  Nevertheless always keep in mind that statistics do not mean anything if you happen to be on the wrong side of them.  Even if a procedure has a 1 (one) in a 1,000,000 (million) chance of going wrong, it you happen to be that one..Also, keep in mind that by statistics, you have more of a chance of having something go wrong when taking medications.  Who should not have this procedure? If you are on a blood thinning medication (e.g. Coumadin, Plavix, see list of "Blood Thinners"), or if you have an active infection going on, you should not have the procedure.  If you are taking any blood thinners, please inform your physician.  How should I prepare for this procedure?  Do not eat or drink anything at least six hours prior to the procedure.  Bring a driver with you .  It cannot be a taxi.  Come accompanied by an adult that can drive you back, and that is strong enough to help you if your legs get weak or numb from the local anesthetic.  Take all of your medicines the morning of the procedure with just enough water to swallow them.  If you have diabetes, make sure that you are scheduled to have your procedure done first thing in the morning, whenever possible.  If you have diabetes, take only half of your insulin dose and notify our nurse that you have done so as soon as you arrive at the clinic.  If you are diabetic, but only take blood sugar pills (oral hypoglycemic), then do not take them on the morning of your procedure.  You may take them after you have had the procedure.  Do not take aspirin or any aspirin-containing medications, at least eleven (11) days prior to the procedure.  They  may prolong bleeding.  Wear loose fitting clothing that may be easy to take off and that you would not mind if it got stained with Betadine or blood.  Do not wear any jewelry or perfume  Remove any nail coloring.  It will interfere with some of our monitoring equipment.  NOTE: Remember that this is not meant to be interpreted as a complete list of all possible complications.  Unforeseen problems may occur.  BLOOD THINNERS  The following drugs contain aspirin or other products, which can cause increased bleeding during surgery and should not be taken for 2 weeks prior to and 1 week after surgery.  If you should need take something for relief of minor pain, you may take acetaminophen which is found in Tylenol,m Datril, Anacin-3 and Panadol. It is not blood thinner. The products listed below are.  Do not take any of the products listed below in addition to any listed on your instruction sheet.  A.P.C or A.P.C with Codeine Codeine Phosphate Capsules #3 Ibuprofen Ridaura  ABC compound Congesprin Imuran rimadil  Advil Cope Indocin Robaxisal  Alka-Seltzer Effervescent Pain Reliever and Antacid Coricidin or Coricidin-D  Indomethacin Rufen  Alka-Seltzer plus Cold Medicine Cosprin Ketoprofen S-A-C Tablets  Anacin Analgesic Tablets or Capsules Coumadin Korlgesic Salflex  Anacin Extra Strength Analgesic tablets or capsules CP-2 Tablets Lanoril Salicylate  Anaprox Cuprimine Capsules Levenox Salocol  Anexsia-D Dalteparin Magan Salsalate  Anodynos Darvon compound Magnesium Salicylate Sine-off  Ansaid Dasin Capsules Magsal Sodium Salicylate  Anturane Depen Capsules Marnal Soma  APF Arthritis pain formula Dewitt's Pills Measurin Stanback  Argesic Dia-Gesic Meclofenamic Sulfinpyrazone  Arthritis Bayer Timed Release Aspirin Diclofenac Meclomen Sulindac  Arthritis pain formula Anacin Dicumarol Medipren Supac  Analgesic (Safety coated) Arthralgen Diffunasal Mefanamic Suprofen  Arthritis Strength Bufferin  Dihydrocodeine Mepro Compound Suprol  Arthropan liquid Dopirydamole Methcarbomol with Aspirin Synalgos  ASA tablets/Enseals Disalcid Micrainin Tagament  Ascriptin Doan's Midol Talwin  Ascriptin A/D Dolene Mobidin Tanderil  Ascriptin Extra Strength Dolobid Moblgesic Ticlid  Ascriptin with Codeine Doloprin or Doloprin with Codeine Momentum Tolectin  Asperbuf Duoprin Mono-gesic Trendar  Aspergum Duradyne Motrin or Motrin IB Triminicin  Aspirin plain, buffered or enteric coated Durasal Myochrisine Trigesic  Aspirin Suppositories Easprin Nalfon Trillsate  Aspirin with Codeine Ecotrin Regular or Extra Strength Naprosyn Uracel  Atromid-S Efficin Naproxen Ursinus  Auranofin Capsules Elmiron Neocylate Vanquish  Axotal Emagrin Norgesic Verin  Azathioprine Empirin or Empirin with Codeine Normiflo Vitamin E  Azolid Emprazil Nuprin Voltaren  Bayer Aspirin plain, buffered or children's or timed BC Tablets or powders Encaprin Orgaran Warfarin Sodium  Buff-a-Comp Enoxaparin Orudis Zorpin  Buff-a-Comp with Codeine Equegesic Os-Cal-Gesic   Buffaprin Excedrin plain, buffered or Extra Strength Oxalid   Bufferin Arthritis Strength Feldene Oxphenbutazone   Bufferin plain or Extra Strength Feldene Capsules Oxycodone with Aspirin   Bufferin with Codeine Fenoprofen Fenoprofen Pabalate or Pabalate-SF   Buffets II Flogesic Panagesic   Buffinol plain or Extra Strength Florinal or Florinal with Codeine Panwarfarin   Buf-Tabs Flurbiprofen Penicillamine   Butalbital Compound Four-way cold tablets Penicillin   Butazolidin Fragmin Pepto-Bismol   Carbenicillin Geminisyn Percodan   Carna Arthritis Reliever Geopen Persantine   Carprofen Gold's salt Persistin   Chloramphenicol Goody's Phenylbutazone   Chloromycetin Haltrain Piroxlcam   Clmetidine heparin Plaquenil   Cllnoril Hyco-pap Ponstel   Clofibrate Hydroxy chloroquine Propoxyphen         Before stopping any of these medications, be sure to consult the  physician who ordered them.  Some, such as Coumadin (Warfarin) are ordered to prevent or treat serious conditions such as "deep thrombosis", "pumonary embolisms", and other heart problems.  The amount of time that you may need off of the medication may also vary with the medication and the reason for which you were taking it.  If you are taking any of these medications, please make sure you notify your pain physician before you undergo any procedures.

## 2017-02-02 NOTE — Progress Notes (Signed)
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 history includes Heart attack in his father.  Past Surgical History:  Procedure Laterality Date  . CARPAL TUNNEL RELEASE    . CATARACT EXTRACTION EXTRACAPSULAR Left 08/04/2015   Procedure: CATARACT EXTRACTION EXTRACAPSULAR WITH INTRAOCULAR LENS PLACEMENT (IOC);  Surgeon: Ronnell Freshwater, MD;  Location: [facility];  Service: Ophthalmology;  Laterality: Left;  DIABETIC - insulin and oral meds  . CATARACT EXTRACTION W/PHACO Right 04/19/2016   Procedure: CATARACT EXTRACTION PHACO AND INTRAOCULAR LENS PLACEMENT (IOC);  Surgeon: Ronnell Freshwater, MD;  Location: [facility];  Service: Ophthalmology;  Laterality: Right;  RIGHT DIABETIC - insulin and oral meds Sleep apnea  . KNEE SURGERY Bilateral   . NECK SURGERY    . SHOULDER SURGERY Right    Constitutional Exam  General appearance: Well nourished, well developed, and well hydrated. In no apparent acute distress Vitals:   02/02/17 1300  BP: 126/65  Pulse: 79  Resp: 16  Temp: 98.7 F (37.1 C)  TempSrc: Oral  SpO2: 97%  Weight:  282 lb (127.9 kg)  Height: _0  (1.753 m)   BMI Assessment: Estimated body mass index is 41.64 kg/m as calculated from the following:   Height as of this encounter: _1  (1.753 m).   Weight as of this encounter: 282 lb (127.9 kg).  BMI interpretation table: BMI level Category Range association with higher incidence of chronic pain  <18 kg/m2 Underweight   18.5-24.9 kg/m2 Ideal body weight   25-29.9 kg/m2 Overweight Increased incidence by 20%  30-34.9 kg/m2 Obese (Class I)  Increased incidence by 68%  35-39.9 kg/m2 Severe obesity (Class II) Increased incidence by 136%  >40 kg/m2 Extreme obesity (Class III) Increased incidence by 254%   BMI Readings from Last 4 Encounters:  02/02/17 41.64 kg/m  01/20/17 44.12 kg/m  08/16/16 41.64 kg/m  07/08/16 43.12 kg/m   Wt Readings from Last 4 Encounters:  02/02/17 282 lb (127.9 kg)  01/20/17 298 lb 12 oz (135.5 kg)  08/16/16 282 lb (127.9 kg)  07/08/16 292 lb (132.5 kg)  Psych/Mental status: Alert, oriented x 3 (person, place, & time)       Eyes: PERLA Respiratory: No evidence of acute respiratory distress  Cervical Spine Exam  Inspection: Well healed scar from previous spine surgery detected Alignment: Symmetrical Functional ROM: Decreased ROM      Stability: No instability detected Muscle strength & Tone: Functionally intact Sensory: Movement-associated discomfort Palpation: Trigger Point detected over the C7 spinous process.              Upper Extremity (UE) Exam    Side: Right upper extremity  Side: Left upper extremity  Inspection: No masses, redness, swelling, or asymmetry. No contractures  Inspection: No masses, redness, swelling, or asymmetry. No contractures  Functional ROM: Limited ROM for shoulder  Functional ROM: Limited ROM for shoulder  Muscle strength & Tone: Functionally intact  Muscle strength & Tone: Functionally intact  Sensory: Movement-associated pain  Sensory: Movement-associated pain  Palpation: No  palpable anomalies              Palpation: No palpable anomalies              Specialized Test(s): Deferred         Specialized Test(s): Deferred          Thoracic Spine Exam  Inspection: No masses, redness, or swelling Alignment: Symmetrical Functional ROM: Unrestricted ROM Stability: No instability detected Sensory: Unimpaired Muscle strength & Tone: No palpable anomalies  Lumbar Spine Exam  Inspection: No masses, redness, or swelling Alignment: Symmetrical Functional ROM: Decreased ROM      Stability: No instability detected Muscle strength & Tone: Functionally intact Sensory: Movement-associated pain Palpation: Complains of area being tender to palpation       Provocative Tests: Lumbar Hyperextension and rotation test: Positive bilaterally for facet joint pain. Patrick's Maneuver: evaluation deferred today                    Gait & Posture Assessment  Ambulation: Patient ambulates using a wheel chair Gait: Limited. Using assistive device to ambulate Posture: Antalgic   Lower Extremity Exam    Side: Right lower extremity  Side: Left lower extremity  Inspection: No masses, redness, swelling, or asymmetry. No contractures  Inspection: No masses, redness, swelling, or asymmetry. No contractures  Functional ROM: Limited ROM for hip and knee joints  Functional ROM: Limited ROM for hip and knee joints  Muscle strength & Tone: Functionally intact  Muscle strength & Tone: Functionally intact  Sensory: Arthropathic arthralgia  Sensory: Arthropathic arthralgia  Palpation: No palpable anomalies  Palpation: No palpable anomalies   Assessment  Primary Diagnosis & Pertinent Problem List: The primary encounter diagnosis was Chronic pain of both shoulders. Diagnoses of Osteoarthritis of shoulder (Bilateral), Chronic knee pain (Bilateral), Osteoarthritis of knee (Bilateral), Chronic shoulder Arthropathy (Bilateral), Chronic low back pain (Location of Primary Source of Pain) (Bilateral) (L>R),  and Lumbar facet syndrome (Bilateral) (L>R) were also pertinent to this visit.  Status Diagnosis  Worsening Persistent Worsening 1. Chronic pain of  both shoulders   2. Osteoarthritis of shoulder (Bilateral)   3. Chronic knee pain (Bilateral)   4. Osteoarthritis of knee (Bilateral)   5. Chronic shoulder Arthropathy (Bilateral)   6. Chronic low back pain (Location of Primary Source of Pain) (Bilateral) (L>R)   7. Lumbar facet syndrome (Bilateral) (L>R)     Problems updated and reviewed during this visit: Problem  Osteoarthritis of knee (Bilateral)  Osteoarthritis of shoulder (Bilateral)  Chronic shoulder Arthropathy (Bilateral)  Chronic lower extremity pain (Location of Secondary source of pain) (Bilateral) (R>L)   Possible left L4 and right S1 radiculopathies.   Chronic shoulder pain (Bilateral) (L>R)   Plan of Care  Pharmacotherapy (Medications Ordered): No orders of the defined types were placed in this encounter.  New Prescriptions   No medications on file   Medications administered today: Mr. Hoar had no medications administered during this visit. Lab-work, procedure(s), and/or referral(s): Orders Placed This Encounter  Procedures  . SUPRASCAPULAR NERVE BLOCK  . KNEE INJECTION  . LUMBAR FACET(MEDIAL BRANCH NERVE BLOCK) MBNB   Imaging and/or referral(s): None  Interventional therapies: Planned, scheduled, and/or pending:   Diagnostic bilateral suprascapular nerve block under fluoro, no sedation. Diagnostic bilateral intra-articular Hyalgan knee injection.   Considering:   Diagnostic bilateral suprascapular nerve block  Diagnosticbilateral series of 5 intra-articular Hyalgan knee injection  Palliative bilateral lumbar facet radiofrequency ablation. (Right done 07/08/2016; 10/02/15 before that. Left  done 12/09/2015)  Right intra-articular shoulder injection  Possible intra-articular right knee injection  Palliative right cervical epidural steroid injection   Palliative bilateral genicular nerve RFA.    Palliative PRN treatment(s):   Palliative bilateral lumbar facet block  Palliative bilateral intra-articular Hyalgan knee injection    Provider-requested follow-up: Return for NS procedure, (ASAP), by MD.  Future Appointments Date Time Provider [place]  02/16/2017 9:00 AM Milinda Pointer, MD [facility] None   Primary Care Physician: Gareth Morgan, MD Location: [facility] Note by: Kathlen Brunswick. Dossie Arbour, M.D, DABA, DABAPM, DABPM, DABIPP, FIPP Date: 02/02/2017; Time: 4:25 PM  Patient instructions provided during this appointment: Patient Instructions   Pain Score  Introduction: The pain score used by this practice is the Verbal Numerical Rating Scale (VNRS-11). This is an 11-point scale. It is for adults and children 10 years or older. There are significant differences in how the pain score is reported, used, and applied. Forget everything you learned in the past and learn this scoring system.  General Information: The scale should reflect your current level of pain. Unless you are specifically asked for the level of your worst pain, or your average pain. If you are asked for one of these two, then it should be understood that it is over the past 24 hours.  Basic Activities of Daily Living (ADL): Personal hygiene, dressing, eating, transferring, and using restroom.  Instructions: Most patients tend to report their level of pain as a combination of two factors, their physical pain and their psychosocial pain. This last one is also known as "suffering" and it is reflection of how physical pain affects you socially and psychologically. From now on, report them separately. From this point on, when asked to report your pain level, report only your physical pain. Use the following table for reference.  Pain Clinic Pain Levels (0-5/10)  Pain Level Score Description  No Pain 0   Mild pain 1 Nagging,  annoying, but does not interfere with basic activities of daily living (ADL). Patients are able to eat, bathe, get dressed,  toileting (being able to get on and off the toilet and perform personal hygiene functions), transfer (move in and out of bed or a chair without assistance), and maintain continence (able to control bladder and bowel functions). Blood pressure and heart rate are unaffected. A normal heart rate for a healthy adult ranges from 60 to 100 bpm (beats per minute).   Mild to moderate pain 2 Noticeable and distracting. Impossible to hide from other people. More frequent flare-ups. Still possible to adapt and function close to normal. It can be very annoying and may have occasional stronger flare-ups. With discipline, patients may get used to it and adapt.   Moderate pain 3 Interferes significantly with activities of daily living (ADL). It becomes difficult to feed, bathe, get dressed, get on and off the toilet or to perform personal hygiene functions. Difficult to get in and out of bed or a chair without assistance. Very distracting. With effort, it can be ignored when deeply involved in activities.   Moderately severe pain 4 Impossible to ignore for more than a few minutes. With effort, patients may still be able to manage work or participate in some social activities. Very difficult to concentrate. Signs of autonomic nervous system discharge are evident: dilated pupils (mydriasis); mild sweating (diaphoresis); sleep interference. Heart rate becomes elevated (>115 bpm). Diastolic blood pressure (lower number) rises above 100 mmHg. Patients find relief in laying down and not moving.   Severe pain 5 Intense and extremely unpleasant. Associated with frowning face and frequent crying. Pain overwhelms the senses.  Ability to do any activity or maintain social relationships becomes significantly limited. Conversation becomes difficult. Pacing back and forth is common, as getting into a comfortable  position is nearly impossible. Pain wakes you up from deep sleep. Physical signs will be obvious: pupillary dilation; increased sweating; goosebumps; brisk reflexes; cold, clammy hands and feet; nausea, vomiting or dry heaves; loss of appetite; significant sleep disturbance with inability to fall asleep or to remain asleep. When persistent, significant weight loss is observed due to the complete loss of appetite and sleep deprivation.  Blood pressure and heart rate becomes significantly elevated. Caution: If elevated blood pressure triggers a pounding headache associated with blurred vision, then the patient should immediately seek attention at an urgent or emergency care unit, as these may be signs of an impending stroke.    Emergency Department Pain Levels (6-10/10)  Emergency Room Pain 6 Severely limiting. Requires emergency care and should not be seen or managed at an outpatient pain management facility. Communication becomes difficult and requires great effort. Assistance to reach the emergency department may be required. Facial flushing and profuse sweating along with potentially dangerous increases in heart rate and blood pressure will be evident.   Distressing pain 7 Self-care is very difficult. Assistance is required to transport, or use restroom. Assistance to reach the emergency department will be required. Tasks requiring coordination, such as bathing and getting dressed become very difficult.   Disabling pain 8 Self-care is no longer possible. At this level, pain is disabling. The individual is unable to do even the most "basic" activities such as walking, eating, bathing, dressing, transferring to a bed, or toileting. Fine motor skills are lost. It is difficult to think clearly.   Incapacitating pain 9 Pain becomes incapacitating. Thought processing is no longer possible. Difficult to remember your own name. Control of movement and coordination are lost.   The worst pain imaginable 10 At  this level, most patients pass out from pain.  When this level is reached, collapse of the autonomic nervous system occurs, leading to a sudden drop in blood pressure and heart rate. This in turn results in a temporary and dramatic drop in blood flow to the brain, leading to a loss of consciousness. Fainting is one of the body's self defense mechanisms. Passing out puts the brain in a calmed state and causes it to shut down for a while, in order to begin the healing process.    Summary: 1. Refer to this scale when providing [place] with your pain level. 2. Be accurate and careful when reporting your pain level. This will help with your care. 3. Over-reporting your pain level will lead to loss of credibility. 4. Even a level of 1/10 means that there is pain and will be treated at our facility. 5. High, inaccurate reporting will be documented as "Symptom Exaggeration", leading to loss of credibility and suspicions of possible secondary gains such as obtaining more narcotics, or wanting to appear disabled, for fraudulent reasons. 6. Only pain levels of 5 or below will be seen at our facility. 7. Pain levels of 6 and above will be sent to the Emergency Department and the appointment cancelled. _____________________________________________________________________________________________  Preparing for your procedure (without sedation) Instructions: . Oral Intake: Do not eat or drink anything for at least 3 hours prior to your procedure. . Transportation: Unless otherwise stated by your physician, you may drive yourself after the procedure. . Blood Pressure Medicine: Take your blood pressure medicine with a sip of water the morning of the procedure. . Blood thinners:  . Diabetics on insulin: Notify the staff so that you can be scheduled 1st case in the morning. If your diabetes requires high dose insulin, take only  of your normal insulin dose the morning of the procedure and notify the staff that you have  done so. . Preventing infections: Shower with an antibacterial soap the morning of your procedure.  . Build-up your immune system: Take 1000 mg of Vitamin C with every meal (3 times a day) the day prior to your procedure. Marland Kitchen Antibiotics: Inform the staff if you have a condition or reason that requires you to take antibiotics before dental procedures. . Pregnancy: If you are pregnant, call and cancel the procedure. . Sickness: If you have a cold, fever, or any active infections, call and cancel the procedure. . Arrival: You must be in the facility at least 30 minutes prior to your scheduled procedure. . Children: Do not bring any children with you. . Dress appropriately: Bring dark clothing that you would not mind if they get stained. . Valuables: Do not bring any jewelry or valuables. Procedure appointments are reserved for interventional treatments only. Marland Kitchen No Prescription Refills. . No medication changes will be discussed during procedure appointments. . No disability issues will be discussed. _____________________________________________________________________________________________  GENERAL RISKS AND COMPLICATIONS  What are the risk, side effects and possible complications? Generally speaking, most procedures are safe.  However, with any procedure there are risks, side effects, and the possibility of complications.  The risks and complications are dependent upon the sites that are lesioned, or the type of nerve block to be performed.  The closer the procedure is to the spine, the more serious the risks are.  Great care is taken when placing the radio frequency needles, block needles or lesioning probes, but sometimes complications can occur. 1. Infection: Any time there is an injection through the skin, there is a risk of infection.  This is  why sterile conditions are used for these blocks.  There are four possible types of infection. 1. Localized skin infection. 2. Central Nervous System  Infection-This can be in the form of Meningitis, which can be deadly. 3. Epidural Infections-This can be in the form of an epidural abscess, which can cause pressure inside of the spine, causing compression of the spinal cord with subsequent paralysis. This would require an emergency surgery to decompress, and there are no guarantees that the patient would recover from the paralysis. 4. Discitis-This is an infection of the intervertebral discs.  It occurs in about 1% of discography procedures.  It is difficult to treat and it may lead to surgery.        2. Pain: the needles have to go through skin and soft tissues, will cause soreness.       3. Damage to internal structures:  The nerves to be lesioned may be near blood vessels or    other nerves which can be potentially damaged.       4. Bleeding: Bleeding is more common if the patient is taking blood thinners such as  aspirin, Coumadin, Ticiid, Plavix, etc., or if he/she have some genetic predisposition  such as hemophilia. Bleeding into the spinal canal can cause compression of the spinal  cord with subsequent paralysis.  This would require an emergency surgery to  decompress and there are no guarantees that the patient would recover from the  paralysis.       5. Pneumothorax:  Puncturing of a lung is a possibility, every time a needle is introduced in  the area of the chest or upper back.  Pneumothorax refers to free air around the  collapsed lung(s), inside of the thoracic cavity (chest cavity).  Another two possible  complications related to a similar event would include: Hemothorax and Chylothorax.   These are variations of the Pneumothorax, where instead of air around the collapsed  lung(s), you may have blood or chyle, respectively.       6. Spinal headaches: They may occur with any procedures in the area of the spine.       7. Persistent CSF (Cerebro-Spinal Fluid) leakage: This is a rare problem, but may occur  with prolonged intrathecal or  epidural catheters either due to the formation of a fistulous  track or a dural tear.       8. Nerve damage: By working so close to the spinal cord, there is always a possibility of  nerve damage, which could be as serious as a permanent spinal cord injury with  paralysis.       9. Death:  Although rare, severe deadly allergic reactions known as "Anaphylactic  reaction" can occur to any of the medications used.      10. Worsening of the symptoms:  We can always make thing worse.  What are the chances of something like this happening? Chances of any of this occuring are extremely low.  By statistics, you have more of a chance of getting killed in a motor vehicle accident: while driving to the hospital than any of the above occurring .  Nevertheless, you should be aware that they are possibilities.  In general, it is similar to taking a shower.  Everybody knows that you can slip, hit your head and get killed.  Does that mean that you should not shower again?  Nevertheless always keep in mind that statistics do not mean anything if you happen to be on the wrong side  of them.  Even if a procedure has a 1 (one) in a 1,000,000 (million) chance of going wrong, it you happen to be that one..Also, keep in mind that by statistics, you have more of a chance of having something go wrong when taking medications.  Who should not have this procedure? If you are on a blood thinning medication (e.g. Coumadin, Plavix, see list of "Blood Thinners"), or if you have an active infection going on, you should not have the procedure.  If you are taking any blood thinners, please inform your physician.  How should I prepare for this procedure?  Do not eat or drink anything at least six hours prior to the procedure.  Bring a driver with you .  It cannot be a taxi.  Come accompanied by an adult that can drive you back, and that is strong enough to help you if your legs get weak or numb from the local anesthetic.  Take all of  your medicines the morning of the procedure with just enough water to swallow them.  If you have diabetes, make sure that you are scheduled to have your procedure done first thing in the morning, whenever possible.  If you have diabetes, take only half of your insulin dose and notify our nurse that you have done so as soon as you arrive at the clinic.  If you are diabetic, but only take blood sugar pills (oral hypoglycemic), then do not take them on the morning of your procedure.  You may take them after you have had the procedure.  Do not take aspirin or any aspirin-containing medications, at least eleven (11) days prior to the procedure.  They may prolong bleeding.  Wear loose fitting clothing that may be easy to take off and that you would not mind if it got stained with Betadine or blood.  Do not wear any jewelry or perfume  Remove any nail coloring.  It will interfere with some of our monitoring equipment.  NOTE: Remember that this is not meant to be interpreted as a complete list of all possible complications.  Unforeseen problems may occur.  BLOOD THINNERS The following drugs contain aspirin or other products, which can cause increased bleeding during surgery and should not be taken for 2 weeks prior to and 1 week after surgery.  If you should need take something for relief of minor pain, you may take acetaminophen which is found in Tylenol,m Datril, Anacin-3 and Panadol. It is not blood thinner. The products listed below are.  Do not take any of the products listed below in addition to any listed on your instruction sheet.  A.P.C or A.P.C with Codeine Codeine Phosphate Capsules #3 Ibuprofen Ridaura  ABC compound Congesprin Imuran rimadil  Advil Cope Indocin Robaxisal  Alka-Seltzer Effervescent Pain Reliever and Antacid Coricidin or Coricidin-D  Indomethacin Rufen  Alka-Seltzer plus Cold Medicine Cosprin Ketoprofen S-A-C Tablets  Anacin Analgesic Tablets or Capsules Coumadin  Korlgesic Salflex  Anacin Extra Strength Analgesic tablets or capsules CP-2 Tablets Lanoril Salicylate  Anaprox Cuprimine Capsules Levenox Salocol  Anexsia-D Dalteparin Magan Salsalate  Anodynos Darvon compound Magnesium Salicylate Sine-off  Ansaid Dasin Capsules Magsal Sodium Salicylate  Anturane Depen Capsules Marnal Soma  APF Arthritis pain formula Dewitt's Pills Measurin Stanback  Argesic Dia-Gesic Meclofenamic Sulfinpyrazone  Arthritis Bayer Timed Release Aspirin Diclofenac Meclomen Sulindac  Arthritis pain formula Anacin Dicumarol Medipren Supac  Analgesic (Safety coated) Arthralgen Diffunasal Mefanamic Suprofen  Arthritis Strength Bufferin Dihydrocodeine Mepro Compound Suprol  Arthropan liquid Dopirydamole  Methcarbomol with Aspirin Synalgos  ASA tablets/Enseals Disalcid Micrainin Tagament  Ascriptin Doan's Midol Talwin  Ascriptin A/D Dolene Mobidin Tanderil  Ascriptin Extra Strength Dolobid Moblgesic Ticlid  Ascriptin with Codeine Doloprin or Doloprin with Codeine Momentum Tolectin  Asperbuf Duoprin Mono-gesic Trendar  Aspergum Duradyne Motrin or Motrin IB Triminicin  Aspirin plain, buffered or enteric coated Durasal Myochrisine Trigesic  Aspirin Suppositories Easprin Nalfon Trillsate  Aspirin with Codeine Ecotrin Regular or Extra Strength Naprosyn Uracel  Atromid-S Efficin Naproxen Ursinus  Auranofin Capsules Elmiron Neocylate Vanquish  Axotal Emagrin Norgesic Verin  Azathioprine Empirin or Empirin with Codeine Normiflo Vitamin E  Azolid Emprazil Nuprin Voltaren  Bayer Aspirin plain, buffered or children's or timed BC Tablets or powders Encaprin Orgaran Warfarin Sodium  Buff-a-Comp Enoxaparin Orudis Zorpin  Buff-a-Comp with Codeine Equegesic Os-Cal-Gesic   Buffaprin Excedrin plain, buffered or Extra Strength Oxalid   Bufferin Arthritis Strength Feldene Oxphenbutazone   Bufferin plain or Extra Strength Feldene Capsules Oxycodone with Aspirin   Bufferin with Codeine  Fenoprofen Fenoprofen Pabalate or Pabalate-SF   Buffets II Flogesic Panagesic   Buffinol plain or Extra Strength Florinal or Florinal with Codeine Panwarfarin   Buf-Tabs Flurbiprofen Penicillamine   Butalbital Compound Four-way cold tablets Penicillin   Butazolidin Fragmin Pepto-Bismol   Carbenicillin Geminisyn Percodan   Carna Arthritis Reliever Geopen Persantine   Carprofen Gold's salt Persistin   Chloramphenicol Goody's Phenylbutazone   Chloromycetin Haltrain Piroxlcam   Clmetidine heparin Plaquenil   Cllnoril Hyco-pap Ponstel   Clofibrate Hydroxy chloroquine Propoxyphen         Before stopping any of these medications, be sure to consult the physician who ordered them.  Some, such as Coumadin (Warfarin) are ordered to prevent or treat serious conditions such as "deep thrombosis", "pumonary embolisms", and other heart problems.  The amount of time that you may need off of the medication may also vary with the medication and the reason for which you were taking it.  If you are taking any of these medications, please make sure you notify your pain physician before you undergo any procedures.

## 2017-02-02 NOTE — Progress Notes (Signed)
Safety precautions to be maintained throughout the outpatient stay will include: orient to surroundings, keep bed in low position, maintain call bell within reach at all times, provide assistance with transfer out of bed and ambulation.  

## 2017-02-16 ENCOUNTER — Ambulatory Visit (HOSPITAL_BASED_OUTPATIENT_CLINIC_OR_DEPARTMENT_OTHER): Payer: Medicare (Managed Care) | Admitting: Pain Medicine

## 2017-02-16 ENCOUNTER — Encounter: Payer: Self-pay | Admitting: Pain Medicine

## 2017-02-16 ENCOUNTER — Ambulatory Visit
Admission: RE | Admit: 2017-02-16 | Discharge: 2017-02-16 | Disposition: A | Discharge status: Home / Self Care | Payer: Medicare (Managed Care) | Source: Ambulatory Visit | Attending: Pain Medicine | Admitting: Pain Medicine

## 2017-02-16 VITALS — BP 111/96 | HR 80 | Temp 98.4°F | Resp 14 | Ht 69.0 in | Wt 282.0 lb

## 2017-02-16 DIAGNOSIS — M19012 Primary osteoarthritis, left shoulder: Secondary | ICD-10-CM | POA: Diagnosis not present

## 2017-02-16 DIAGNOSIS — M19011 Primary osteoarthritis, right shoulder: Secondary | ICD-10-CM | POA: Diagnosis not present

## 2017-02-16 DIAGNOSIS — M25512 Pain in left shoulder: Secondary | ICD-10-CM

## 2017-02-16 DIAGNOSIS — M542 Cervicalgia: Secondary | ICD-10-CM

## 2017-02-16 DIAGNOSIS — M25511 Pain in right shoulder: Secondary | ICD-10-CM | POA: Insufficient documentation

## 2017-02-16 DIAGNOSIS — G8929 Other chronic pain: Secondary | ICD-10-CM | POA: Diagnosis present

## 2017-02-16 DIAGNOSIS — M791 Myalgia: Secondary | ICD-10-CM

## 2017-02-16 DIAGNOSIS — M7918 Myalgia, other site: Secondary | ICD-10-CM | POA: Insufficient documentation

## 2017-02-16 MED ORDER — METHYLPREDNISOLONE ACETATE 80 MG/ML IJ SUSP
80.0000 mg | Freq: Once | INTRAMUSCULAR | Status: AC
  Administered 2017-02-16: 80 mg
  Filled 2017-02-16: qty 1

## 2017-02-16 MED ORDER — TRIAMCINOLONE ACETONIDE 40 MG/ML IJ SUSP
INTRAMUSCULAR | Status: AC
  Filled 2017-02-16: qty 1

## 2017-02-16 MED ORDER — ROPIVACAINE HCL 2 MG/ML IJ SOLN
9.0000 mL | Freq: Once | INTRAMUSCULAR | Status: AC
  Administered 2017-02-16: 10 mL
  Filled 2017-02-16: qty 10

## 2017-02-16 MED ORDER — LIDOCAINE HCL (PF) 1 % IJ SOLN
10.0000 mL | Freq: Once | INTRAMUSCULAR | Status: AC
  Administered 2017-02-16: 5 mL

## 2017-02-16 MED ORDER — LIDOCAINE HCL (PF) 1 % IJ SOLN
INTRAMUSCULAR | Status: AC
  Filled 2017-02-16: qty 5

## 2017-02-16 MED ORDER — LIDOCAINE HCL (PF) 1 % IJ SOLN
10.0000 mL | Freq: Once | INTRAMUSCULAR | Status: AC
  Administered 2017-02-16: 10 mL
  Filled 2017-02-16: qty 10

## 2017-02-16 MED ORDER — TRIAMCINOLONE ACETONIDE 40 MG/ML IJ SUSP
40.0000 mg | Freq: Once | INTRAMUSCULAR | Status: AC
  Administered 2017-02-16: 40 mg

## 2017-02-16 NOTE — Patient Instructions (Addendum)
____________________________________________________________________________________________  Post-Procedure instructions Instructions:  Apply ice: Fill a plastic sandwich bag with crushed ice. Cover it with a small towel and apply to injection site. Apply for 15 minutes then remove x 15 minutes. Repeat sequence on day of procedure, until you go to bed. The purpose is to minimize swelling and discomfort after procedure.  Apply heat: Apply heat to procedure site starting the day following the procedure. The purpose is to treat any soreness and discomfort from the procedure.  Food intake: Start with clear liquids (like water) and advance to regular food, as tolerated.   Physical activities: Keep activities to a minimum for the first 8 hours after the procedure.   Driving: If you have received any sedation, you are not allowed to drive for 24 hours after your procedure.  Blood thinner: Restart your blood thinner 6 hours after your procedure. (Only for those taking blood thinners)  Insulin: As soon as you can eat, you may resume your normal dosing schedule. (Only for those taking insulin)  Infection prevention: Keep procedure site clean and dry.  Post-procedure Pain Diary: Extremely important that this be done correctly and accurately. Recorded information will be used to determine the next step in treatment.  Pain evaluated is that of treated area only. Do not include pain from an untreated area.  Complete every hour, on the hour, for the initial 8 hours. Set an alarm to help you do this part accurately.  Do not go to sleep and have it completed later. It will not be accurate.  Follow-up appointment: Keep your follow-up appointment after the procedure. Usually 2 weeks for most procedures. (6 weeks in the case of radiofrequency.) Bring you pain diary.  Expect:  From numbing medicine (AKA: Local Anesthetics): Numbness or decrease in pain.  Onset: Full effect within 15 minutes of  injected.  Duration: It will depend on the type of local anesthetic used. On the average, 1 to 8 hours.   From steroids: Decrease in swelling or inflammation. Once inflammation is improved, relief of the pain will follow.  Onset of benefits: Depends on the amount of swelling present. The more swelling, the longer it will take for the benefits to be seen.   Duration: Steroids will stay in the system x 2 weeks. Duration of benefits will depend on multiple posibilities including persistent irritating factors.  From procedure: Some discomfort is to be expected once the numbing medicine wears off. This should be minimal if ice and heat are applied as instructed. Call if:  You experience numbness and weakness that gets worse with time, as opposed to wearing off.  New onset bowel or bladder incontinence. (Spinal procedures only)  Emergency Numbers:  Durning business hours (Monday - Thursday, 8:00 AM - 4:00 PM) (Friday, 9:00 AM - 12:00 Noon): [phone]  After hours: [phone] ____________________________________________________________________________________________  Pain Management Discharge Instructions  General Discharge Instructions :  If you need to reach your doctor call: Monday-Friday 8:00 am - 4:00 pm at [phone] or toll free [phone].  After clinic hours [phone] to have operator reach doctor.  Bring all of your medication bottles to all your appointments in the pain clinic.  To cancel or reschedule your appointment with Pain Management please remember to call 24 hours in advance to avoid a fee.  Refer to the educational materials which you have been given on: General Risks, I had my Procedure. Discharge Instructions, Post Sedation.  Post Procedure Instructions:  The drugs you were given will stay in your  system until tomorrow, so for the next 24 hours you should not drive, make any legal decisions or drink any alcoholic beverages.  You may eat  anything you prefer, but it is better to start with liquids then soups and crackers, and gradually work up to solid foods.  Please notify your doctor immediately if you have any unusual bleeding, trouble breathing or pain that is not related to your normal pain.  Depending on the type of procedure that was done, some parts of your body may feel week and/or numb.  This usually clears up by tonight or the next day.  Walk with the use of an assistive device or accompanied by an adult for the 24 hours.  You may use ice on the affected area for the first 24 hours.  Put ice in a Ziploc bag and cover with a towel and place against area 15 minutes on 15 minutes off.  You may switch to heat after 24 hours. Trigger Point Injection Trigger points are areas where you have pain. A trigger point injection is a shot given in the trigger point to help relieve pain for a few days to a few months. Common places for trigger points include:  The neck.  The shoulders.  The upper back.  The lower back. A trigger point injection will not cure long-lasting (chronic) pain permanently. These injections do not always work for every person, but for some people they can help to relieve pain for a few days to a few months. Tell a health care provider about:  Any allergies you have.  All medicines you are taking, including vitamins, herbs, eye drops, creams, and over-the-counter medicines.  Any problems you or family members have had with anesthetic medicines.  Any blood disorders you have.  Any surgeries you have had.  Any medical conditions you have. What are the risks? Generally, this is a safe procedure. However, problems may occur, including:  Infection.  Bleeding.  Allergic reaction to the injected medicine.  Irritation of the skin around the injection site. What happens before the procedure?  Ask your health care provider about changing or stopping your regular medicines. This is especially  important if you are taking diabetes medicines or blood thinners. What happens during the procedure?  Your health care provider will feel for trigger points. A marker may be used to circle the area for the injection.  The skin over the trigger point will be washed with a germ-killing (antiseptic) solution.  A thin needle is used for the shot. You may feel pain or a twitching feeling when the needle enters the trigger point.  A numbing solution may be injected into the trigger point. Sometimes a medicine to keep down swelling, redness, and warmth (inflammation) is also injected.  Your health care provider may move the needle around the area where the trigger point is located until the tightness and twitching goes away.  After the injection, your health care provider may put gentle pressure over the injection site.  The injection site will be covered with a bandage (dressing). The procedure may vary among health care providers and hospitals. What happens after the procedure?  The dressing can be taken off in a few hours or as told by your health care provider.  You may feel sore and stiff for 1-2 days. This information is not intended to replace advice given to you by your health care provider. Make sure you discuss any questions you have with your health care provider. Document  Released: 08/26/2011 Document Revised: 05/09/2016 Document Reviewed: 02/24/2015 [employer] Interactive Patient Education  2017 [employer] GENERAL RISKS AND COMPLICATIONS  What are the risk, side effects and possible complications? Generally speaking, most procedures are safe.  However, with any procedure there are risks, side effects, and the possibility of complications.  The risks and complications are dependent upon the sites that are lesioned, or the type of nerve block to be performed.  The closer the procedure is to the spine, the more serious the risks are.  Great care is taken when placing the radio  frequency needles, block needles or lesioning probes, but sometimes complications can occur. 1. Infection: Any time there is an injection through the skin, there is a risk of infection.  This is why sterile conditions are used for these blocks.  There are four possible types of infection. 1. Localized skin infection. 2. Central Nervous System Infection-This can be in the form of Meningitis, which can be deadly. 3. Epidural Infections-This can be in the form of an epidural abscess, which can cause pressure inside of the spine, causing compression of the spinal cord with subsequent paralysis. This would require an emergency surgery to decompress, and there are no guarantees that the patient would recover from the paralysis. 4. Discitis-This is an infection of the intervertebral discs.  It occurs in about 1% of discography procedures.  It is difficult to treat and it may lead to surgery.        2. Pain: the needles have to go through skin and soft tissues, will cause soreness.       3. Damage to internal structures:  The nerves to be lesioned may be near blood vessels or    other nerves which can be potentially damaged.       4. Bleeding: Bleeding is more common if the patient is taking blood thinners such as  aspirin, Coumadin, Ticiid, Plavix, etc., or if he/she have some genetic predisposition  such as hemophilia. Bleeding into the spinal canal can cause compression of the spinal  cord with subsequent paralysis.  This would require an emergency surgery to  decompress and there are no guarantees that the patient would recover from the  paralysis.       5. Pneumothorax:  Puncturing of a lung is a possibility, every time a needle is introduced in  the area of the chest or upper back.  Pneumothorax refers to free air around the  collapsed lung(s), inside of the thoracic cavity (chest cavity).  Another two possible  complications related to a similar event would include: Hemothorax and Chylothorax.   These are  variations of the Pneumothorax, where instead of air around the collapsed  lung(s), you may have blood or chyle, respectively.       6. Spinal headaches: They may occur with any procedures in the area of the spine.       7. Persistent CSF (Cerebro-Spinal Fluid) leakage: This is a rare problem, but may occur  with prolonged intrathecal or epidural catheters either due to the formation of a fistulous  track or a dural tear.       8. Nerve damage: By working so close to the spinal cord, there is always a possibility of  nerve damage, which could be as serious as a permanent spinal cord injury with  paralysis.       9. Death:  Although rare, severe deadly allergic reactions known as "Anaphylactic  reaction" can occur to any of the medications used.  10. Worsening of the symptoms:  We can always make thing worse.  What are the chances of something like this happening? Chances of any of this occuring are extremely low.  By statistics, you have more of a chance of getting killed in a motor vehicle accident: while driving to the hospital than any of the above occurring .  Nevertheless, you should be aware that they are possibilities.  In general, it is similar to taking a shower.  Everybody knows that you can slip, hit your head and get killed.  Does that mean that you should not shower again?  Nevertheless always keep in mind that statistics do not mean anything if you happen to be on the wrong side of them.  Even if a procedure has a 1 (one) in a 1,000,000 (million) chance of going wrong, it you happen to be that one..Also, keep in mind that by statistics, you have more of a chance of having something go wrong when taking medications.  Who should not have this procedure? If you are on a blood thinning medication (e.g. Coumadin, Plavix, see list of "Blood Thinners"), or if you have an active infection going on, you should not have the procedure.  If you are taking any blood thinners, please inform your  physician.  How should I prepare for this procedure?  Do not eat or drink anything at least six hours prior to the procedure.  Bring a driver with you .  It cannot be a taxi.  Come accompanied by an adult that can drive you back, and that is strong enough to help you if your legs get weak or numb from the local anesthetic.  Take all of your medicines the morning of the procedure with just enough water to swallow them.  If you have diabetes, make sure that you are scheduled to have your procedure done first thing in the morning, whenever possible.  If you have diabetes, take only half of your insulin dose and notify our nurse that you have done so as soon as you arrive at the clinic.  If you are diabetic, but only take blood sugar pills (oral hypoglycemic), then do not take them on the morning of your procedure.  You may take them after you have had the procedure.  Do not take aspirin or any aspirin-containing medications, at least eleven (11) days prior to the procedure.  They may prolong bleeding.  Wear loose fitting clothing that may be easy to take off and that you would not mind if it got stained with Betadine or blood.  Do not wear any jewelry or perfume  Remove any nail coloring.  It will interfere with some of our monitoring equipment.  NOTE: Remember that this is not meant to be interpreted as a complete list of all possible complications.  Unforeseen problems may occur.  BLOOD THINNERS The following drugs contain aspirin or other products, which can cause increased bleeding during surgery and should not be taken for 2 weeks prior to and 1 week after surgery.  If you should need take something for relief of minor pain, you may take acetaminophen which is found in Tylenol,m Datril, Anacin-3 and Panadol. It is not blood thinner. The products listed below are.  Do not take any of the products listed below in addition to any listed on your instruction sheet.  A.P.C or A.P.C with  Codeine Codeine Phosphate Capsules #3 Ibuprofen Ridaura  ABC compound Congesprin Imuran rimadil  Advil Cope Indocin Robaxisal  Alka-Seltzer Effervescent Pain Reliever and Antacid Coricidin or Coricidin-D  Indomethacin Rufen  Alka-Seltzer plus Cold Medicine Cosprin Ketoprofen S-A-C Tablets  Anacin Analgesic Tablets or Capsules Coumadin Korlgesic Salflex  Anacin Extra Strength Analgesic tablets or capsules CP-2 Tablets Lanoril Salicylate  Anaprox Cuprimine Capsules Levenox Salocol  Anexsia-D Dalteparin Magan Salsalate  Anodynos Darvon compound Magnesium Salicylate Sine-off  Ansaid Dasin Capsules Magsal Sodium Salicylate  Anturane Depen Capsules Marnal Soma  APF Arthritis pain formula Dewitt's Pills Measurin Stanback  Argesic Dia-Gesic Meclofenamic Sulfinpyrazone  Arthritis Bayer Timed Release Aspirin Diclofenac Meclomen Sulindac  Arthritis pain formula Anacin Dicumarol Medipren Supac  Analgesic (Safety coated) Arthralgen Diffunasal Mefanamic Suprofen  Arthritis Strength Bufferin Dihydrocodeine Mepro Compound Suprol  Arthropan liquid Dopirydamole Methcarbomol with Aspirin Synalgos  ASA tablets/Enseals Disalcid Micrainin Tagament  Ascriptin Doan's Midol Talwin  Ascriptin A/D Dolene Mobidin Tanderil  Ascriptin Extra Strength Dolobid Moblgesic Ticlid  Ascriptin with Codeine Doloprin or Doloprin with Codeine Momentum Tolectin  Asperbuf Duoprin Mono-gesic Trendar  Aspergum Duradyne Motrin or Motrin IB Triminicin  Aspirin plain, buffered or enteric coated Durasal Myochrisine Trigesic  Aspirin Suppositories Easprin Nalfon Trillsate  Aspirin with Codeine Ecotrin Regular or Extra Strength Naprosyn Uracel  Atromid-S Efficin Naproxen Ursinus  Auranofin Capsules Elmiron Neocylate Vanquish  Axotal Emagrin Norgesic Verin  Azathioprine Empirin or Empirin with Codeine Normiflo Vitamin E  Azolid Emprazil Nuprin Voltaren  Bayer Aspirin plain, buffered or children's or timed BC Tablets or powders  Encaprin Orgaran Warfarin Sodium  Buff-a-Comp Enoxaparin Orudis Zorpin  Buff-a-Comp with Codeine Equegesic Os-Cal-Gesic   Buffaprin Excedrin plain, buffered or Extra Strength Oxalid   Bufferin Arthritis Strength Feldene Oxphenbutazone   Bufferin plain or Extra Strength Feldene Capsules Oxycodone with Aspirin   Bufferin with Codeine Fenoprofen Fenoprofen Pabalate or Pabalate-SF   Buffets II Flogesic Panagesic   Buffinol plain or Extra Strength Florinal or Florinal with Codeine Panwarfarin   Buf-Tabs Flurbiprofen Penicillamine   Butalbital Compound Four-way cold tablets Penicillin   Butazolidin Fragmin Pepto-Bismol   Carbenicillin Geminisyn Percodan   Carna Arthritis Reliever Geopen Persantine   Carprofen Gold's salt Persistin   Chloramphenicol Goody's Phenylbutazone   Chloromycetin Haltrain Piroxlcam   Clmetidine heparin Plaquenil   Cllnoril Hyco-pap Ponstel   Clofibrate Hydroxy chloroquine Propoxyphen         Before stopping any of these medications, be sure to consult the physician who ordered them.  Some, such as Coumadin (Warfarin) are ordered to prevent or treat serious conditions such as "deep thrombosis", "pumonary embolisms", and other heart problems.  The amount of time that you may need off of the medication may also vary with the medication and the reason for which you were taking it.  If you are taking any of these medications, please make sure you notify your pain physician before you undergo any procedures.         Pain Management Discharge Instructions  General Discharge Instructions :  If you need to reach your doctor call: Monday-Friday 8:00 am - 4:00 pm at [phone] or toll free [phone].  After clinic hours [phone] to have operator reach doctor.  Bring all of your medication bottles to all your appointments in the pain clinic.  To cancel or reschedule your appointment with Pain Management please remember to call 24 hours in advance to avoid a  fee.  Refer to the educational materials which you have been given on: General Risks, I had my Procedure. Discharge Instructions, Post Sedation.  Post Procedure Instructions:  The drugs you were given will stay  in your system until tomorrow, so for the next 24 hours you should not drive, make any legal decisions or drink any alcoholic beverages.  You may eat anything you prefer, but it is better to start with liquids then soups and crackers, and gradually work up to solid foods.  Please notify your doctor immediately if you have any unusual bleeding, trouble breathing or pain that is not related to your normal pain.  Depending on the type of procedure that was done, some parts of your body may feel week and/or numb.  This usually clears up by tonight or the next day.  Walk with the use of an assistive device or accompanied by an adult for the 24 hours.  You may use ice on the affected area for the first 24 hours.  Put ice in a Ziploc bag and cover with a towel and place against area 15 minutes on 15 minutes off.  You may switch to heat after 24 hours.

## 2017-02-16 NOTE — Progress Notes (Signed)
Patient's Name: James Friedman  MRN: [id number]  Referring Provider: Bobbye Morton, MD  DOB: 1946-05-17  PCP: Bobbye Morton, MD  DOS: 02/16/2017  Note by: Sydnee Levans. Laban Emperor, MD  Service setting: Ambulatory outpatient  Location: [facility] (AMB) Pain Management Facility  Visit type: Procedure  Specialty: Interventional Pain Management  Patient type: Established   Primary Reason for Visit: Interventional Pain Management Treatment. CC: Back Pain (upper and mid) and Knee Pain (bilateral)  Procedure #1:  Anesthesia, Analgesia, Anxiolysis:  Type: Diagnostic Suprascapular nerve Block Region: Posterior Shoulder & Scapular Areas Level: Superior to the scapular spine, in the lateral aspect of the supraspinatus fossa (Suprescapular notch). Laterality: Bilateral  Type: Local Anesthesia Local Anesthetic: Lidocaine 1% Route: Infiltration ([place]/IM) IV Access: Declined Sedation: Declined  Indication(s): Analgesia          Indications: 1. Chronic shoulder Arthropathy (Bilateral)   2. Chronic shoulder pain (Bilateral) (L>R)   3. Osteoarthritis of shoulder (Bilateral)    Procedure #2:    Type: Trigger Point Injection Level: C7 spinous processes Laterality: Midline Position: Prone Target Muscle: Supraspinous ligament and paravertebral muscles as well as trapezius muscle Approach: Percutaneous Region: Posterior  Cervical area. Primary Purpose: Diagnostic       Indications: 1. Myofascial pain syndrome, cervical   2. Chronic neck pain (Bilateral) (R>L)     Pain Score: Pre-procedure: 6 /10 Post-procedure: 0-No pain/10  Pre-op Assessment:  Previous date of service: 02/02/17 Service provided: Evaluation James Friedman is a 71 y.o. (year old), male patient, seen today for interventional treatment. He  has a past surgical history that includes Knee surgery (Bilateral); Neck surgery; Carpal tunnel release; Shoulder surgery (Right); Cataract extraction, extracapsular (Left, 08/04/2015); and Cataract  extraction w/PHACO (Right, 04/19/2016). His primarily concern today is the Back Pain (upper and mid) and Knee Pain (bilateral)  Initial Vital Signs: Blood pressure 122/67, pulse 80, temperature 98.4 F (36.9 C), resp. rate 18, height 5\' 9"  (1.753 m), weight 282 lb (127.9 kg), SpO2 97 %. BMI: 41.64 kg/m  Risk Assessment: Allergies: Reviewed. He has No Known Allergies.  Allergy Precautions: None required Coagulopathies: Reviewed. None identified.  Blood-thinner therapy: None at this time Active Infection(s): Reviewed. None identified. James Friedman is afebrile  Site Confirmation: James Friedman was asked to confirm the procedure and laterality before marking the site Procedure checklist: Completed Consent: Before the procedure and under the influence of no sedative(s), amnesic(s), or anxiolytics, the patient was informed of the treatment options, risks and possible complications. To fulfill our ethical and legal obligations, as recommended by the American Medical Association's Code of Ethics, I have informed the patient of my clinical impression; the nature and purpose of the treatment or procedure; the risks, benefits, and possible complications of the intervention; the alternatives, including doing nothing; the risk(s) and benefit(s) of the alternative treatment(s) or procedure(s); and the risk(s) and benefit(s) of doing nothing. The patient was provided information about the general risks and possible complications associated with the procedure. These may include, but are not limited to: failure to achieve desired goals, infection, bleeding, organ or nerve damage, allergic reactions, paralysis, and death. In addition, the patient was informed of those risks and complications associated to the procedure, such as failure to decrease pain; infection; bleeding; organ or nerve damage with subsequent damage to sensory, motor, and/or autonomic systems, resulting in permanent pain, numbness, and/or weakness of  one or several areas of the body; allergic reactions; (i.e.: anaphylactic reaction); and/or death. Furthermore, the patient was informed of those  risks and complications associated with the medications. These include, but are not limited to: allergic reactions (i.e.: anaphylactic or anaphylactoid reaction(s)); adrenal axis suppression; blood sugar elevation that in diabetics may result in ketoacidosis or comma; water retention that in patients with history of congestive heart failure may result in shortness of breath, pulmonary edema, and decompensation with resultant heart failure; weight gain; swelling or edema; medication-induced neural toxicity; particulate matter embolism and blood vessel occlusion with resultant organ, and/or nervous system infarction; and/or aseptic necrosis of one or more joints. Finally, the patient was informed that Medicine is not an exact science; therefore, there is also the possibility of unforeseen or unpredictable risks and/or possible complications that may result in a catastrophic outcome. The patient indicated having understood very clearly. We have given the patient no guarantees and we have made no promises. Enough time was given to the patient to ask questions, all of which were answered to the patient's satisfaction. James Friedman has indicated that he wanted to continue with the procedure. Attestation: I, the ordering provider, attest that I have discussed with the patient the benefits, risks, side-effects, alternatives, likelihood of achieving goals, and potential problems during recovery for the procedure that I have provided informed consent. Date: 02/16/2017; Time: 9:37 AM  Pre-Procedure Preparation:  Monitoring: As per clinic protocol. Respiration, ETCO2, SpO2, BP, heart rate and rhythm monitor placed and checked for adequate function Safety Precautions: Patient was assessed for positional comfort and pressure points before starting the procedure. Time-out: I  initiated and conducted the "Time-out" before starting the procedure, as per protocol. The patient was asked to participate by confirming the accuracy of the "Time Out" information. Verification of the correct person, site, and procedure were performed and confirmed by me, the nursing staff, and the patient. "Time-out" conducted as per Joint Commission's Universal Protocol (UP.01.01.01). "Time-out" Date & Time: 02/16/2017; 1012 hrs.  Description of Procedure #1 Process:   Position: Prone Target Area: Suprascapular notch. Approach: Posterior approach. Area Prepped: Entire shoulder Area Prepping solution: ChloraPrep (2% chlorhexidine gluconate and 70% isopropyl alcohol) Safety Precautions: Aspiration looking for blood return was conducted prior to all injections. At no point did we inject any substances, as a needle was being advanced. No attempts were made at seeking any paresthesias. Safe injection practices and needle disposal techniques used. Medications properly checked for expiration dates. SDV (single dose vial) medications used. Description of the Procedure: Protocol guidelines were followed. The patient was placed in position over the procedure table. The target area was identified and the area prepped in the usual manner. Skin & deeper tissues infiltrated with local anesthetic. Appropriate amount of time allowed to pass for local anesthetics to take effect. The procedure needles were then advanced to the target area. Proper needle placement secured. Negative aspiration confirmed. Solution injected in intermittent fashion, asking for systemic symptoms every 0.5cc of injectate. The needles were then removed and the area cleansed, making sure to leave some of the prepping solution back to take advantage of its long term bactericidal properties. Vitals:   02/16/17 1010 02/16/17 1015 02/16/17 1020 02/16/17 1029  BP: (!) 131/98 137/85 126/84 (!) 111/96  Pulse:      Resp: 12 10 (!) 25 14  Temp:        SpO2: 95% 95% 95% 94%  Weight:      Height:        Start Time: 1015 hrs. Materials:  Needle(s) Type: Regular needle Gauge: 22G Length: 3.5-in Medication(s): We administered methylPREDNISolone acetate, ropivacaine (PF)  2 mg/mL (0.2%), and lidocaine (PF). Please see chart orders for dosing details.  Imaging Guidance for procedure #1 (Non-Spinal):  Type of Imaging Technique: Fluoroscopy Guidance (Non-Spinal) Indication(s): Assistance in needle guidance and placement for procedures requiring needle placement in or near specific anatomical locations not easily accessible without such assistance. Exposure Time: Please see nurses notes. Contrast: Before injecting any contrast, we confirmed that the patient did not have an allergy to iodine, shellfish, or radiological contrast. Once satisfactory needle placement was completed at the desired level, radiological contrast was injected. Contrast injected under live fluoroscopy. No contrast complications. See chart for type and volume of contrast used. Fluoroscopic Guidance: I was personally present during the use of fluoroscopy. "Tunnel Vision Technique" used to obtain the best possible view of the target area. Parallax error corrected before commencing the procedure. "Direction-depth-direction" technique used to introduce the needle under continuous pulsed fluoroscopy. Once target was reached, antero-posterior, oblique, and lateral fluoroscopic projection used confirm needle placement in all planes. Images permanently stored in EMR. Interpretation: I personally interpreted the imaging intraoperatively. Adequate needle placement confirmed in multiple planes. Appropriate spread of contrast into desired area was observed. No evidence of afferent or efferent intravascular uptake. Permanent images saved into the patient's record.  Description of Procedure #2 Process:   Prepping solution: ChloraPrep (2% chlorhexidine gluconate and 70% isopropyl alcohol) Safety  Precautions: Aspiration looking for blood return was conducted prior to all injections. At no point did we inject any substances, as a needle was being advanced. No attempts were made at seeking any paresthesias. Safe injection practices and needle disposal techniques used. Medications properly checked for expiration dates. SDV (single dose vial) medications used. Description of the Procedure: Protocol guidelines were followed. The patient was placed in position over the fluoroscopy table. The target area was identified and the area prepped in the usual manner. Skin desensitized using vapocoolant spray. Skin & deeper tissues infiltrated with local anesthetic. Appropriate amount of time allowed to pass for local anesthetics to take effect. The procedure needles were then advanced to the target area. Proper needle placement secured. Negative aspiration confirmed. Solution injected in intermittent fashion, asking for systemic symptoms every 0.5cc of injectate. The needles were then removed and the area cleansed, making sure to leave some of the prepping solution back to take advantage of its long term bactericidal properties.  End Time: 1024 hrs. Materials:  Needle(s) Type: Regular needle Gauge: 25G Length: 1.5-in Medication(s): We administered lidocaine (PF), and triamcinolone acetonide. Please see chart orders for dosing details.  Post-operative Assessment:  EBL: None Complications: No immediate post-treatment complications observed by team, or reported by patient. Note: The patient tolerated the entire procedure well. A repeat set of vitals were taken after the procedure and the patient was kept under observation following institutional policy, for this type of procedure. Post-procedural neurological assessment was performed, showing return to baseline, prior to discharge. The patient was provided with post-procedure discharge instructions, including a section on how to identify potential problems.  Should any problems arise concerning this procedure, the patient was given instructions to immediately contact us, at any time, without hesitation. In any case, we plan to contact the patient by telephone for a follow-up status report regarding this interventional procedure. Comments:  No additional relevant information.  Plan of Care  Disposition: Discharge home  Discharge Date & Time: 02/16/2017; 1038 hrs.  Physician-requested Follow-up:  Return for post-procedure eval (in 2 wks), w/ MD.  Future Appointments Date Time Provider Department Center  03/03/2017 10:45  AM Delano Metz, MD [facility] None   Medications ordered for procedure: Meds ordered this encounter  Medications  . methylPREDNISolone acetate (DEPO-MEDROL) injection 80 mg  . ropivacaine (PF) 2 mg/mL (0.2%) (NAROPIN) injection 9 mL  . lidocaine (PF) (XYLOCAINE) 1 % injection 10 mL  . lidocaine (PF) (XYLOCAINE) 1 % injection 10 mL  . triamcinolone acetonide (KENALOG-40) injection 40 mg   Medications administered: We administered methylPREDNISolone acetate, ropivacaine (PF) 2 mg/mL (0.2%), lidocaine (PF), lidocaine (PF), and triamcinolone acetonide.  See the medical record for exact dosing, route, and time of administration.  Lab-work, Procedure(s), & Referral(s) Ordered: Orders Placed This Encounter  Procedures  . SUPRASCAPULAR NERVE BLOCK  . TRIGGER POINT INJECTION  . DG C-Arm 1-60 Min-No Report  . Informed Consent Details: Transcribe to consent form and obtain patient signature  . Provider attestation of informed consent for procedure/surgical case  . Verify informed consent  . Discharge instructions  . Follow-up   Imaging Ordered: Results for orders placed in visit on 07/08/16  DG C-Arm 1-60 Min-No Report   Narrative CLINICAL DATA: Assistance in needle guidance and placement for procedures  requiring needle placement in or near specific anatomical locations not  easily accessible without such assistance.    C-ARM 1-60 MINUTES  Fluoroscopy was utilized by the requesting physician.  No radiographic  interpretation.     New Prescriptions   No medications on file   Primary Care Physician: Bobbye Morton, MD Location: [facility] Note by: Sydnee Levans. Laban Emperor, M.D, DABA, DABAPM, DABPM, DABIPP, FIPP Date: 02/16/2017; Time: 11:27 AM  Disclaimer:  Medicine is not an Visual merchandiser. The only guarantee in medicine is that nothing is guaranteed. It is important to note that the decision to proceed with this intervention was based on the information collected from the patient. The Data and conclusions were drawn from the patient's questionnaire, the interview, and the physical examination. Because the information was provided in large part by the patient, it cannot be guaranteed that it has not been purposely or unconsciously manipulated. Every effort has been made to obtain as much relevant data as possible for this evaluation. It is important to note that the conclusions that lead to this procedure are derived in large part from the available data. Always take into account that the treatment will also be dependent on availability of resources and existing treatment guidelines, considered by other Pain Management Practitioners as being common knowledge and practice, at the time of the intervention. For Medico-Legal purposes, it is also important to point out that variation in procedural techniques and pharmacological choices are the acceptable norm. The indications, contraindications, technique, and results of the above procedure should only be interpreted and judged by a Board-Certified Interventional Pain Specialist with extensive familiarity and expertise in the same exact procedure and technique.  Instructions provided at this appointment: Patient Instructions   ____________________________________________________________________________________________  Post-Procedure  instructions Instructions:  Apply ice: Fill a plastic sandwich bag with crushed ice. Cover it with a small towel and apply to injection site. Apply for 15 minutes then remove x 15 minutes. Repeat sequence on day of procedure, until you go to bed. The purpose is to minimize swelling and discomfort after procedure.  Apply heat: Apply heat to procedure site starting the day following the procedure. The purpose is to treat any soreness and discomfort from the procedure.  Food intake: Start with clear liquids (like water) and advance to regular food, as tolerated.   Physical activities: Keep activities  to a minimum for the first 8 hours after the procedure.   Driving: If you have received any sedation, you are not allowed to drive for 24 hours after your procedure.  Blood thinner: Restart your blood thinner 6 hours after your procedure. (Only for those taking blood thinners)  Insulin: As soon as you can eat, you may resume your normal dosing schedule. (Only for those taking insulin)  Infection prevention: Keep procedure site clean and dry.  Post-procedure Pain Diary: Extremely important that this be done correctly and accurately. Recorded information will be used to determine the next step in treatment.  Pain evaluated is that of treated area only. Do not include pain from an untreated area.  Complete every hour, on the hour, for the initial 8 hours. Set an alarm to help you do this part accurately.  Do not go to sleep and have it completed later. It will not be accurate.  Follow-up appointment: Keep your follow-up appointment after the procedure. Usually 2 weeks for most procedures. (6 weeks in the case of radiofrequency.) Bring you pain diary.  Expect:  From numbing medicine (AKA: Local Anesthetics): Numbness or decrease in pain.  Onset: Full effect within 15 minutes of injected.  Duration: It will depend on the type of local anesthetic used. On the average, 1 to 8 hours.   From  steroids: Decrease in swelling or inflammation. Once inflammation is improved, relief of the pain will follow.  Onset of benefits: Depends on the amount of swelling present. The more swelling, the longer it will take for the benefits to be seen.   Duration: Steroids will stay in the system x 2 weeks. Duration of benefits will depend on multiple posibilities including persistent irritating factors.  From procedure: Some discomfort is to be expected once the numbing medicine wears off. This should be minimal if ice and heat are applied as instructed. Call if:  You experience numbness and weakness that gets worse with time, as opposed to wearing off.  New onset bowel or bladder incontinence. (Spinal procedures only)  Emergency Numbers:  Durning business hours (Monday - Thursday, 8:00 AM - 4:00 PM) (Friday, 9:00 AM - 12:00 Noon): [phone]  After hours: [phone] ____________________________________________________________________________________________  Pain Management Discharge Instructions  General Discharge Instructions :  If you need to reach your doctor call: Monday-Friday 8:00 am - 4:00 pm at [phone] or toll free [phone].  After clinic hours [phone] to have operator reach doctor.  Bring all of your medication bottles to all your appointments in the pain clinic.  To cancel or reschedule your appointment with Pain Management please remember to call 24 hours in advance to avoid a fee.  Refer to the educational materials which you have been given on: General Risks, I had my Procedure. Discharge Instructions, Post Sedation.  Post Procedure Instructions:  The drugs you were given will stay in your system until tomorrow, so for the next 24 hours you should not drive, make any legal decisions or drink any alcoholic beverages.  You may eat anything you prefer, but it is better to start with liquids then soups and crackers, and gradually work up to solid  foods.  Please notify your doctor immediately if you have any unusual bleeding, trouble breathing or pain that is not related to your normal pain.  Depending on the type of procedure that was done, some parts of your body may feel week and/or numb.  This usually clears up by tonight or the next day.  Walk with the use of an assistive device or accompanied by an adult for the 24 hours.  You may use ice on the affected area for the first 24 hours.  Put ice in a Ziploc bag and cover with a towel and place against area 15 minutes on 15 minutes off.  You may switch to heat after 24 hours. Trigger Point Injection Trigger points are areas where you have pain. A trigger point injection is a shot given in the trigger point to help relieve pain for a few days to a few months. Common places for trigger points include:  The neck.  The shoulders.  The upper back.  The lower back. A trigger point injection will not cure long-lasting (chronic) pain permanently. These injections do not always work for every person, but for some people they can help to relieve pain for a few days to a few months. Tell a health care provider about:  Any allergies you have.  All medicines you are taking, including vitamins, herbs, eye drops, creams, and over-the-counter medicines.  Any problems you or family members have had with anesthetic medicines.  Any blood disorders you have.  Any surgeries you have had.  Any medical conditions you have. What are the risks? Generally, this is a safe procedure. However, problems may occur, including:  Infection.  Bleeding.  Allergic reaction to the injected medicine.  Irritation of the skin around the injection site. What happens before the procedure?  Ask your health care provider about changing or stopping your regular medicines. This is especially important if you are taking diabetes medicines or blood thinners. What happens during the procedure?  Your health  care provider will feel for trigger points. A marker may be used to circle the area for the injection.  The skin over the trigger point will be washed with a germ-killing (antiseptic) solution.  A thin needle is used for the shot. You may feel pain or a twitching feeling when the needle enters the trigger point.  A numbing solution may be injected into the trigger point. Sometimes a medicine to keep down swelling, redness, and warmth (inflammation) is also injected.  Your health care provider may move the needle around the area where the trigger point is located until the tightness and twitching goes away.  After the injection, your health care provider may put gentle pressure over the injection site.  The injection site will be covered with a bandage (dressing). The procedure may vary among health care providers and hospitals. What happens after the procedure?  The dressing can be taken off in a few hours or as told by your health care provider.  You may feel sore and stiff for 1-2 days. This information is not intended to replace advice given to you by your health care provider. Make sure you discuss any questions you have with your health care provider. Document Released: 08/26/2011 Document Revised: 05/09/2016 Document Reviewed: 02/24/2015 Elsevier Interactive Patient Education  2017 [employer] GENERAL RISKS AND COMPLICATIONS  What are the risk, side effects and possible complications? Generally speaking, most procedures are safe.  However, with any procedure there are risks, side effects, and the possibility of complications.  The risks and complications are dependent upon the sites that are lesioned, or the type of nerve block to be performed.  The closer the procedure is to the spine, the more serious the risks are.  Great care is taken when placing the radio frequency needles, block needles or lesioning probes, but  sometimes complications can occur. 1. Infection: Any time there  is an injection through the skin, there is a risk of infection.  This is why sterile conditions are used for these blocks.  There are four possible types of infection. 1. Localized skin infection. 2. Central Nervous System Infection-This can be in the form of Meningitis, which can be deadly. 3. Epidural Infections-This can be in the form of an epidural abscess, which can cause pressure inside of the spine, causing compression of the spinal cord with subsequent paralysis. This would require an emergency surgery to decompress, and there are no guarantees that the patient would recover from the paralysis. 4. Discitis-This is an infection of the intervertebral discs.  It occurs in about 1% of discography procedures.  It is difficult to treat and it may lead to surgery.        2. Pain: the needles have to go through skin and soft tissues, will cause soreness.       3. Damage to internal structures:  The nerves to be lesioned may be near blood vessels or    other nerves which can be potentially damaged.       4. Bleeding: Bleeding is more common if the patient is taking blood thinners such as  aspirin, Coumadin, Ticiid, Plavix, etc., or if he/she have some genetic predisposition  such as hemophilia. Bleeding into the spinal canal can cause compression of the spinal  cord with subsequent paralysis.  This would require an emergency surgery to  decompress and there are no guarantees that the patient would recover from the  paralysis.       5. Pneumothorax:  Puncturing of a lung is a possibility, every time a needle is introduced in  the area of the chest or upper back.  Pneumothorax refers to free air around the  collapsed lung(s), inside of the thoracic cavity (chest cavity).  Another two possible  complications related to a similar event would include: Hemothorax and Chylothorax.   These are variations of the Pneumothorax, where instead of air around the collapsed  lung(s), you may have blood or chyle,  respectively.       6. Spinal headaches: They may occur with any procedures in the area of the spine.       7. Persistent CSF (Cerebro-Spinal Fluid) leakage: This is a rare problem, but may occur  with prolonged intrathecal or epidural catheters either due to the formation of a fistulous  track or a dural tear.       8. Nerve damage: By working so close to the spinal cord, there is always a possibility of  nerve damage, which could be as serious as a permanent spinal cord injury with  paralysis.       9. Death:  Although rare, severe deadly allergic reactions known as "Anaphylactic  reaction" can occur to any of the medications used.      10. Worsening of the symptoms:  We can always make thing worse.  What are the chances of something like this happening? Chances of any of this occuring are extremely low.  By statistics, you have more of a chance of getting killed in a motor vehicle accident: while driving to the hospital than any of the above occurring .  Nevertheless, you should be aware that they are possibilities.  In general, it is similar to taking a shower.  Everybody knows that you can slip, hit your head and get killed.  Does that mean that you should  not shower again?  Nevertheless always keep in mind that statistics do not mean anything if you happen to be on the wrong side of them.  Even if a procedure has a 1 (one) in a 1,000,000 (million) chance of going wrong, it you happen to be that one..Also, keep in mind that by statistics, you have more of a chance of having something go wrong when taking medications.  Who should not have this procedure? If you are on a blood thinning medication (e.g. Coumadin, Plavix, see list of "Blood Thinners"), or if you have an active infection going on, you should not have the procedure.  If you are taking any blood thinners, please inform your physician.  How should I prepare for this procedure?  Do not eat or drink anything at least six hours prior to the  procedure.  Bring a driver with you .  It cannot be a taxi.  Come accompanied by an adult that can drive you back, and that is strong enough to help you if your legs get weak or numb from the local anesthetic.  Take all of your medicines the morning of the procedure with just enough water to swallow them.  If you have diabetes, make sure that you are scheduled to have your procedure done first thing in the morning, whenever possible.  If you have diabetes, take only half of your insulin dose and notify our nurse that you have done so as soon as you arrive at the clinic.  If you are diabetic, but only take blood sugar pills (oral hypoglycemic), then do not take them on the morning of your procedure.  You may take them after you have had the procedure.  Do not take aspirin or any aspirin-containing medications, at least eleven (11) days prior to the procedure.  They may prolong bleeding.  Wear loose fitting clothing that may be easy to take off and that you would not mind if it got stained with Betadine or blood.  Do not wear any jewelry or perfume  Remove any nail coloring.  It will interfere with some of our monitoring equipment.  NOTE: Remember that this is not meant to be interpreted as a complete list of all possible complications.  Unforeseen problems may occur.  BLOOD THINNERS The following drugs contain aspirin or other products, which can cause increased bleeding during surgery and should not be taken for 2 weeks prior to and 1 week after surgery.  If you should need take something for relief of minor pain, you may take acetaminophen which is found in Tylenol,m Datril, Anacin-3 and Panadol. It is not blood thinner. The products listed below are.  Do not take any of the products listed below in addition to any listed on your instruction sheet.  A.P.C or A.P.C with Codeine Codeine Phosphate Capsules #3 Ibuprofen Ridaura  ABC compound Congesprin Imuran rimadil  Advil Cope Indocin  Robaxisal  Alka-Seltzer Effervescent Pain Reliever and Antacid Coricidin or Coricidin-D  Indomethacin Rufen  Alka-Seltzer plus Cold Medicine Cosprin Ketoprofen S-A-C Tablets  Anacin Analgesic Tablets or Capsules Coumadin Korlgesic Salflex  Anacin Extra Strength Analgesic tablets or capsules CP-2 Tablets Lanoril Salicylate  Anaprox Cuprimine Capsules Levenox Salocol  Anexsia-D Dalteparin Magan Salsalate  Anodynos Darvon compound Magnesium Salicylate Sine-off  Ansaid Dasin Capsules Magsal Sodium Salicylate  Anturane Depen Capsules Marnal Soma  APF Arthritis pain formula Dewitt's Pills Measurin Stanback  Argesic Dia-Gesic Meclofenamic Sulfinpyrazone  Arthritis Bayer Timed Release Aspirin Diclofenac Meclomen Sulindac  Arthritis pain formula  Anacin Dicumarol Medipren Supac  Analgesic (Safety coated) Arthralgen Diffunasal Mefanamic Suprofen  Arthritis Strength Bufferin Dihydrocodeine Mepro Compound Suprol  Arthropan liquid Dopirydamole Methcarbomol with Aspirin Synalgos  ASA tablets/Enseals Disalcid Micrainin Tagament  Ascriptin Doan's Midol Talwin  Ascriptin A/D Dolene Mobidin Tanderil  Ascriptin Extra Strength Dolobid Moblgesic Ticlid  Ascriptin with Codeine Doloprin or Doloprin with Codeine Momentum Tolectin  Asperbuf Duoprin Mono-gesic Trendar  Aspergum Duradyne Motrin or Motrin IB Triminicin  Aspirin plain, buffered or enteric coated Durasal Myochrisine Trigesic  Aspirin Suppositories Easprin Nalfon Trillsate  Aspirin with Codeine Ecotrin Regular or Extra Strength Naprosyn Uracel  Atromid-S Efficin Naproxen Ursinus  Auranofin Capsules Elmiron Neocylate Vanquish  Axotal Emagrin Norgesic Verin  Azathioprine Empirin or Empirin with Codeine Normiflo Vitamin E  Azolid Emprazil Nuprin Voltaren  Bayer Aspirin plain, buffered or children's or timed BC Tablets or powders Encaprin Orgaran Warfarin Sodium  Buff-a-Comp Enoxaparin Orudis Zorpin  Buff-a-Comp with Codeine Equegesic Os-Cal-Gesic     Buffaprin Excedrin plain, buffered or Extra Strength Oxalid   Bufferin Arthritis Strength Feldene Oxphenbutazone   Bufferin plain or Extra Strength Feldene Capsules Oxycodone with Aspirin   Bufferin with Codeine Fenoprofen Fenoprofen Pabalate or Pabalate-SF   Buffets II Flogesic Panagesic   Buffinol plain or Extra Strength Florinal or Florinal with Codeine Panwarfarin   Buf-Tabs Flurbiprofen Penicillamine   Butalbital Compound Four-way cold tablets Penicillin   Butazolidin Fragmin Pepto-Bismol   Carbenicillin Geminisyn Percodan   Carna Arthritis Reliever Geopen Persantine   Carprofen Gold's salt Persistin   Chloramphenicol Goody's Phenylbutazone   Chloromycetin Haltrain Piroxlcam   Clmetidine heparin Plaquenil   Cllnoril Hyco-pap Ponstel   Clofibrate Hydroxy chloroquine Propoxyphen         Before stopping any of these medications, be sure to consult the physician who ordered them.  Some, such as Coumadin (Warfarin) are ordered to prevent or treat serious conditions such as "deep thrombosis", "pumonary embolisms", and other heart problems.  The amount of time that you may need off of the medication may also vary with the medication and the reason for which you were taking it.  If you are taking any of these medications, please make sure you notify your pain physician before you undergo any procedures.         Pain Management Discharge Instructions  General Discharge Instructions :  If you need to reach your doctor call: Monday-Friday 8:00 am - 4:00 pm at [phone] or toll free [phone].  After clinic hours [phone] to have operator reach doctor.  Bring all of your medication bottles to all your appointments in the pain clinic.  To cancel or reschedule your appointment with Pain Management please remember to call 24 hours in advance to avoid a fee.  Refer to the educational materials which you have been given on: General Risks, I had my Procedure. Discharge  Instructions, Post Sedation.  Post Procedure Instructions:  The drugs you were given will stay in your system until tomorrow, so for the next 24 hours you should not drive, make any legal decisions or drink any alcoholic beverages.  You may eat anything you prefer, but it is better to start with liquids then soups and crackers, and gradually work up to solid foods.  Please notify your doctor immediately if you have any unusual bleeding, trouble breathing or pain that is not related to your normal pain.  Depending on the type of procedure that was done, some parts of your body may feel week and/or numb.  This usually clears up  by tonight or the next day.  Walk with the use of an assistive device or accompanied by an adult for the 24 hours.  You may use ice on the affected area for the first 24 hours.  Put ice in a Ziploc bag and cover with a towel and place against area 15 minutes on 15 minutes off.  You may switch to heat after 24 hours.

## 2017-02-16 NOTE — Progress Notes (Signed)
Safety precautions to be maintained throughout the outpatient stay will include: orient to surroundings, keep bed in low position, maintain call bell within reach at all times, provide assistance with transfer out of bed and ambulation.  

## 2017-02-17 ENCOUNTER — Telehealth: Payer: Self-pay

## 2017-02-17 NOTE — Telephone Encounter (Signed)
Post procedure phone call.  Patient states he is doing good.  

## 2017-03-03 ENCOUNTER — Encounter: Payer: Self-pay | Admitting: Pain Medicine

## 2017-03-03 ENCOUNTER — Ambulatory Visit: Payer: Medicare (Managed Care) | Attending: Pain Medicine | Admitting: Pain Medicine

## 2017-03-03 VITALS — BP 124/96 | HR 80 | Temp 99.1°F | Resp 18 | Ht 69.0 in | Wt 280.0 lb

## 2017-03-03 DIAGNOSIS — I10 Essential (primary) hypertension: Secondary | ICD-10-CM | POA: Insufficient documentation

## 2017-03-03 DIAGNOSIS — M25562 Pain in left knee: Secondary | ICD-10-CM

## 2017-03-03 DIAGNOSIS — Z79891 Long term (current) use of opiate analgesic: Secondary | ICD-10-CM | POA: Diagnosis not present

## 2017-03-03 DIAGNOSIS — M48061 Spinal stenosis, lumbar region without neurogenic claudication: Secondary | ICD-10-CM | POA: Insufficient documentation

## 2017-03-03 DIAGNOSIS — M25511 Pain in right shoulder: Secondary | ICD-10-CM

## 2017-03-03 DIAGNOSIS — G894 Chronic pain syndrome: Secondary | ICD-10-CM | POA: Insufficient documentation

## 2017-03-03 DIAGNOSIS — M19012 Primary osteoarthritis, left shoulder: Secondary | ICD-10-CM | POA: Diagnosis not present

## 2017-03-03 DIAGNOSIS — G5601 Carpal tunnel syndrome, right upper limb: Secondary | ICD-10-CM | POA: Diagnosis not present

## 2017-03-03 DIAGNOSIS — M25561 Pain in right knee: Secondary | ICD-10-CM | POA: Diagnosis not present

## 2017-03-03 DIAGNOSIS — Z7982 Long term (current) use of aspirin: Secondary | ICD-10-CM | POA: Diagnosis not present

## 2017-03-03 DIAGNOSIS — Z794 Long term (current) use of insulin: Secondary | ICD-10-CM | POA: Insufficient documentation

## 2017-03-03 DIAGNOSIS — Z6841 Body Mass Index (BMI) 40.0 and over, adult: Secondary | ICD-10-CM | POA: Diagnosis not present

## 2017-03-03 DIAGNOSIS — M542 Cervicalgia: Secondary | ICD-10-CM | POA: Diagnosis not present

## 2017-03-03 DIAGNOSIS — G4733 Obstructive sleep apnea (adult) (pediatric): Secondary | ICD-10-CM | POA: Diagnosis not present

## 2017-03-03 DIAGNOSIS — E1140 Type 2 diabetes mellitus with diabetic neuropathy, unspecified: Secondary | ICD-10-CM | POA: Insufficient documentation

## 2017-03-03 DIAGNOSIS — F1190 Opioid use, unspecified, uncomplicated: Secondary | ICD-10-CM

## 2017-03-03 DIAGNOSIS — M19011 Primary osteoarthritis, right shoulder: Secondary | ICD-10-CM | POA: Diagnosis not present

## 2017-03-03 DIAGNOSIS — M170 Bilateral primary osteoarthritis of knee: Secondary | ICD-10-CM | POA: Diagnosis not present

## 2017-03-03 DIAGNOSIS — Z79899 Other long term (current) drug therapy: Secondary | ICD-10-CM | POA: Diagnosis not present

## 2017-03-03 DIAGNOSIS — K219 Gastro-esophageal reflux disease without esophagitis: Secondary | ICD-10-CM | POA: Diagnosis not present

## 2017-03-03 DIAGNOSIS — M25512 Pain in left shoulder: Secondary | ICD-10-CM | POA: Diagnosis not present

## 2017-03-03 DIAGNOSIS — M545 Low back pain: Secondary | ICD-10-CM | POA: Diagnosis not present

## 2017-03-03 DIAGNOSIS — G8929 Other chronic pain: Secondary | ICD-10-CM

## 2017-03-03 MED ORDER — OXYCODONE HCL 5 MG PO TABS: 5.0000 mg | ORAL_TABLET | ORAL | 0 refills | Status: DC | PRN

## 2017-03-03 MED ORDER — MORPHINE SULFATE ER 15 MG PO TBCR: 15.0000 mg | EXTENDED_RELEASE_TABLET | Freq: Two times a day (BID) | ORAL | 0 refills | Status: DC

## 2017-03-03 MED ORDER — OXYCODONE HCL 5 MG PO TABS: 5.0000 mg | ORAL_TABLET | Freq: Four times a day (QID) | ORAL | 0 refills | Status: DC | PRN

## 2017-03-03 NOTE — Patient Instructions (Addendum)
____________________________________________________________________________________________  Medication Rules  Applies to: All patients receiving prescriptions (written or electronic).  Pharmacy of record: Pharmacy where electronic prescriptions will be sent. If written prescriptions are taken to a different pharmacy, please inform the nursing staff. The pharmacy listed in the electronic medical record should be the one where you would like electronic prescriptions to be sent.  Prescription refills: Only during scheduled appointments. Applies to both, written and electronic prescriptions.  NOTE: The following applies primarily to controlled substances (Opioid Pain Medications)  Patient's responsibilities: 1. Pain Pills: Bring all pain pills to every appointment (except for procedure appointments). 2. Pill Bottles: Bring pills in original pharmacy bottle. Always bring newest bottle. Bring bottle, even if empty. 3. Medication refills: You are responsible for knowing and keeping track of what medications you need refilled. The day before your appointment, write a list of all prescriptions that need to be refilled. Bring that list to your appointment and give it to the admitting nurse. Prescriptions will be written only during appointments. If you forget a medication, it will not be "Called in", "Faxed", or "electronically sent". You will need to get another appointment to get these prescribed. 4. Prescription Accuracy: You are responsible for carefully inspecting your prescriptions before leaving our office. Have the discharge nurse carefully go over each prescription with you, before taking them home. Make sure that your name is accurately spelled, that your address is correct. Check the name and dose of your medication to make sure it is accurate. Check the number of pills, and the written instructions to make sure they are clear and accurate. Make sure that you are given enough medication to last  until your next medication refill appointment. 5. Taking Medication: Take medication as prescribed. Never take more pills than instructed. Never take medication more frequently than prescribed. Taking less pills or less frequently is permitted and encouraged, when it comes to controlled substances (written prescriptions).  6. Inform other Doctors: Always inform, all of your healthcare providers, of all the medications you take. 7. Pain Medication from other Providers: You are not allowed to accept any additional pain medication from any other Doctor or Healthcare provider. There are two exceptions to this rule. (see below) In the event that you require additional pain medication, you are responsible for notifying us, as stated below. 8. Medication Agreement: You are responsible for carefully reading and following our Medication Agreement. This must be signed before receiving any prescriptions from our practice. Safely store a copy of your signed Agreement. Violations to the Agreement will result in no further prescriptions. (Additional copies of our Medication Agreement are available upon request.) 9. Laws, Rules, & Regulations: All patients are expected to follow all Federal and [employer], Rules, [employer]. Ignorance of the Laws does not constitute a valid excuse.  Exceptions: There are only two exceptions to the rule of not receiving pain medications from other Healthcare Providers. 1. Exception #1 (Emergencies): In the event of an emergency (i.e.: accident requiring emergency care), you are allowed to receive additional pain medication. However, you are responsible for: As soon as you are able, call our office [phone], at any time of the day or night, and leave a message stating your name, the date and nature of the emergency, and the name and dose of the medication prescribed. In the event that your call is answered by a member of our staff, make sure to document and save the date,  time, and the name of the person that  took your information.  2. Exception #2 (Planned Surgery): In the event that you are scheduled by another doctor or dentist to have any type of surgery or procedure, you are allowed (for a period no longer than 30 days), to receive additional pain medication, for the acute post-op pain. However, in this case, you are responsible for picking up a copy of our "Post-op Pain Management for Surgeons" handout, and giving it to your surgeon or dentist. This document is available at our office, and does not require an appointment to obtain it. Simply go to our office during business hours (Monday-Thursday from 8:00 AM to 4:00 PM) (Friday 8:00 AM to 12:00 Noon) or if you have a scheduled appointment with [place], prior to your surgery, and ask for it by name. In addition, you will need to provide [place] with your name, name of your surgeon, type of surgery, and date of procedure or surgery.  ____________________________________________________________________________________________  ____________________________________________________________________________________________  Appointment Policy  It is our goal and responsibility to provide the medical community with assistance in the evaluation and management of patients with chronic pain. Unfortunately our resources are limited. Because we do not have an unlimited amount of time, or available appointments, we are required to closely monitor and manage their use. The following rules exist to maximize their use:  Patient's responsibilities: 1. Punctuality: You are required to be physically present and registered in our facility at least 30 minutes before your appointment. 2. Tardiness: The cutoff is your appointment time. If you have an appointment scheduled for 10:00 AM and you arrive at 10:01, you will be required to reschedule your appointment.  3. Plan ahead: Always assume that you will encounter traffic on your way in. Plan for  it. If you are dependent on a driver, make sure they understand these rules and the need to arrive early. 4. Other appointments and responsibilities: Avoid scheduling any other appointments before or after your pain clinic appointments.  5. Be prepared: Write down everything that you need to discuss with your healthcare provider and give this information to the admitting nurse. Write down the medications that you will need refilled. Bring your pills and bottles (even the empty ones), to all of your appointments, except for those where a procedure is scheduled. 6. No children or pets: Find someone to take care of them. It is not appropriate to bring them in. 7. Scheduling changes: We request "advanced notification" of any changes or cancellations. 8. Advanced notification: Defined as a time period of more than 24 hours prior to the originally scheduled appointment. This allows for the appointment to be offered to other patients. 9. Rescheduling: When a visit is rescheduled, it will require the cancellation of the original appointment. For this reason they both fall within the category of "Cancellations".  10. Cancellations: They require advanced notification. Any cancellation less than 24 hours before the  appointment will be recorded as a "No Show". 11. No Show: Defined as an unkept appointment where the patient failed to notify or declare to the practice their intention or inability to keep the appointment.  Corrective process for repeat offenders:  1. Tardiness: Three (3) episodes of rescheduling due to late arrivals will be recorded as one (1) "No Show". 2. Cancellation or reschedule: Three (3) cancellations or rescheduling will be recorded as one (1) "No Show". 3. "No Shows": Three (3) "No Shows" within a 12 month period will result in discharge from the  practice.  ____________________________________________________________________________________________  ____________________________________________________________________________________________  Pain Scale  Introduction: The pain score used by this practice is the Verbal Numerical Rating Scale (VNRS-11). This is an 11-point scale. It is for adults and children 10 years or older. There are significant differences in how the pain score is reported, used, and applied. Forget everything you learned in the past and learn this scoring system.  General Information: The scale should reflect your current level of pain. Unless you are specifically asked for the level of your worst pain, or your average pain. If you are asked for one of these two, then it should be understood that it is over the past 24 hours.  Basic Activities of Daily Living (ADL): Personal hygiene, dressing, eating, transferring, and using restroom.  Instructions: Most patients tend to report their level of pain as a combination of two factors, their physical pain and their psychosocial pain. This last one is also known as "suffering" and it is reflection of how physical pain affects you socially and psychologically. From now on, report them separately. From this point on, when asked to report your pain level, report only your physical pain. Use the following table for reference.  Pain Clinic Pain Levels (0-5/10)  Pain Level Score  Description  No Pain 0   Mild pain 1 Nagging, annoying, but does not interfere with basic activities of daily living (ADL). Patients are able to eat, bathe, get dressed, toileting (being able to get on and off the toilet and perform personal hygiene functions), transfer (move in and out of bed or a chair without assistance), and maintain continence (able to control bladder and bowel functions). Blood pressure and heart rate are unaffected. A normal heart rate for a healthy adult ranges from 60 to 100 bpm  (beats per minute).   Mild to moderate pain 2 Noticeable and distracting. Impossible to hide from other people. More frequent flare-ups. Still possible to adapt and function close to normal. It can be very annoying and may have occasional stronger flare-ups. With discipline, patients may get used to it and adapt.   Moderate pain 3 Interferes significantly with activities of daily living (ADL). It becomes difficult to feed, bathe, get dressed, get on and off the toilet or to perform personal hygiene functions. Difficult to get in and out of bed or a chair without assistance. Very distracting. With effort, it can be ignored when deeply involved in activities.   Moderately severe pain 4 Impossible to ignore for more than a few minutes. With effort, patients may still be able to manage work or participate in some social activities. Very difficult to concentrate. Signs of autonomic nervous system discharge are evident: dilated pupils (mydriasis); mild sweating (diaphoresis); sleep interference. Heart rate becomes elevated (>115 bpm). Diastolic blood pressure (lower number) rises above 100 mmHg. Patients find relief in laying down and not moving.   Severe pain 5 Intense and extremely unpleasant. Associated with frowning face and frequent crying. Pain overwhelms the senses.  Ability to do any activity or maintain social relationships becomes significantly limited. Conversation becomes difficult. Pacing back and forth is common, as getting into a comfortable position is nearly impossible. Pain wakes you up from deep sleep. Physical signs will be obvious: pupillary dilation; increased sweating; goosebumps; brisk reflexes; cold, clammy hands and feet; nausea, vomiting or dry heaves; loss of appetite; significant sleep disturbance with inability to fall asleep or to remain asleep. When persistent, significant weight loss is observed due to the complete loss of appetite and sleep deprivation.  Blood pressure and heart  rate  becomes significantly elevated. Caution: If elevated blood pressure triggers a pounding headache associated with blurred vision, then the patient should immediately seek attention at an urgent or emergency care unit, as these may be signs of an impending stroke.    Emergency Department Pain Levels (6-10/10)  Emergency Room Pain 6 Severely limiting. Requires emergency care and should not be seen or managed at an outpatient pain management facility. Communication becomes difficult and requires great effort. Assistance to reach the emergency department may be required. Facial flushing and profuse sweating along with potentially dangerous increases in heart rate and blood pressure will be evident.   Distressing pain 7 Self-care is very difficult. Assistance is required to transport, or use restroom. Assistance to reach the emergency department will be required. Tasks requiring coordination, such as bathing and getting dressed become very difficult.   Disabling pain 8 Self-care is no longer possible. At this level, pain is disabling. The individual is unable to do even the most "basic" activities such as walking, eating, bathing, dressing, transferring to a bed, or toileting. Fine motor skills are lost. It is difficult to think clearly.   Incapacitating pain 9 Pain becomes incapacitating. Thought processing is no longer possible. Difficult to remember your own name. Control of movement and coordination are lost.   The worst pain imaginable 10 At this level, most patients pass out from pain. When this level is reached, collapse of the autonomic nervous system occurs, leading to a sudden drop in blood pressure and heart rate. This in turn results in a temporary and dramatic drop in blood flow to the brain, leading to a loss of consciousness. Fainting is one of the body's self defense mechanisms. Passing out puts the brain in a calmed state and causes it to shut down for a while, in order to begin the  healing process.    Summary: 1. Refer to this scale when providing [place] with your pain level. 2. Be accurate and careful when reporting your pain level. This will help with your care. 3. Over-reporting your pain level will lead to loss of credibility. 4. Even a level of 1/10 means that there is pain and will be treated at our facility. 5. High, inaccurate reporting will be documented as "Symptom Exaggeration", leading to loss of credibility and suspicions of possible secondary gains such as obtaining more narcotics, or wanting to appear disabled, for fraudulent reasons. 6. Only pain levels of 5 or below will be seen at our facility. 7. Pain levels of 6 and above will be sent to the Emergency Department and the appointment cancelled. ____________________________________________________________________________________________  ____________________________________________________________________________________________  Preparing for your procedure (without sedation) Instructions: . Oral Intake: Do not eat or drink anything for at least 3 hours prior to your procedure. . Transportation: Unless otherwise stated by your physician, you may drive yourself after the procedure. . Blood Pressure Medicine: Take your blood pressure medicine with a sip of water the morning of the procedure. . Blood thinners:  . Diabetics on insulin: Notify the staff so that you can be scheduled 1st case in the morning. If your diabetes requires high dose insulin, take only  of your normal insulin dose the morning of the procedure and notify the staff that you have done so. . Preventing infections: Shower with an antibacterial soap the morning of your procedure.  . Build-up your immune system: Take 1000 mg of Vitamin C with every meal (3 times a day) the day prior to your procedure. Marland Kitchen Antibiotics: Inform the staff  if you have a condition or reason that requires you to take antibiotics before dental procedures. . Pregnancy:  If you are pregnant, call and cancel the procedure. . Sickness: If you have a cold, fever, or any active infections, call and cancel the procedure. . Arrival: You must be in the facility at least 30 minutes prior to your scheduled procedure. . Children: Do not bring any children with you. . Dress appropriately: Bring dark clothing that you would not mind if they get stained. . Valuables: Do not bring any jewelry or valuables. Procedure appointments are reserved for interventional treatments only. Marland Kitchen. No Prescription Refills. . No medication changes will be discussed during procedure appointments. . No disability issues will be discussed. ____________________________________________________________________________________________   Knee Injection A knee injection is a procedure to get medicine into your knee joint. Your health care provider puts a needle into the joint and injects medicine with an attached syringe. The injected medicine may relieve the pain, swelling, and stiffness of arthritis. The injected medicine may also help to lubricate and cushion your knee joint. You may need more than one injection. Tell a health care provider about:  Any allergies you have.  All medicines you are taking, including vitamins, herbs, eye drops, creams, and over-the-counter medicines.  Any problems you or family members have had with anesthetic medicines.  Any blood disorders you have.  Any surgeries you have had.  Any medical conditions you have. What are the risks? Generally, this is a safe procedure. However, problems may occur, including:  Infection.  Bleeding.  Worsening symptoms.  Damage to the area around your knee.  Allergic reaction to any of the medicines.  Skin reactions from repeated injections.  What happens before the procedure?  Ask your health care provider about changing or stopping your regular medicines. This is especially important if you are taking diabetes  medicines or blood thinners.  Plan to have someone take you home after the procedure. What happens during the procedure?  You will sit or lie down in a position for your knee to be treated.  The skin over your kneecap will be cleaned with a germ-killing solution (antiseptic).  You will be given a medicine that numbs the area (local anesthetic). You may feel some stinging.  After your knee becomes numb, you will have a second injection. This is the medicine. This needle is carefully placed between your kneecap and your knee. The medicine is injected into the joint space.  At the end of the procedure, the needle will be removed.  A bandage (dressing) may be placed over the injection site. The procedure may vary among health care providers and hospitals. What happens after the procedure?  You may have to move your knee through its full range of motion. This helps to get all of the medicine into your joint space.  Your blood pressure, heart rate, breathing rate, and blood oxygen level will be monitored often until the medicines you were given have worn off.  You will be watched to make sure that you do not have a reaction to the injected medicine. This information is not intended to replace advice given to you by your health care provider. Make sure you discuss any questions you have with your health care provider. Document Released: 11/28/2006 Document Revised: 02/06/2016 Document Reviewed: 07/17/2014 [employer] Interactive Patient Education  2018 [employer] GENERAL RISKS AND COMPLICATIONS  What are the risk, side effects and possible complications? Generally speaking, most procedures are safe.  However, with  any procedure there are risks, side effects, and the possibility of complications.  The risks and complications are dependent upon the sites that are lesioned, or the type of nerve block to be performed.  The closer the procedure is to the spine, the more serious the risks are.   Great care is taken when placing the radio frequency needles, block needles or lesioning probes, but sometimes complications can occur. 1. Infection: Any time there is an injection through the skin, there is a risk of infection.  This is why sterile conditions are used for these blocks.  There are four possible types of infection. 1. Localized skin infection. 2. Central Nervous System Infection-This can be in the form of Meningitis, which can be deadly. 3. Epidural Infections-This can be in the form of an epidural abscess, which can cause pressure inside of the spine, causing compression of the spinal cord with subsequent paralysis. This would require an emergency surgery to decompress, and there are no guarantees that the patient would recover from the paralysis. 4. Discitis-This is an infection of the intervertebral discs.  It occurs in about 1% of discography procedures.  It is difficult to treat and it may lead to surgery.        2. Pain: the needles have to go through skin and soft tissues, will cause soreness.       3. Damage to internal structures:  The nerves to be lesioned may be near blood vessels or    other nerves which can be potentially damaged.       4. Bleeding: Bleeding is more common if the patient is taking blood thinners such as  aspirin, Coumadin, Ticiid, Plavix, etc., or if he/she have some genetic predisposition  such as hemophilia. Bleeding into the spinal canal can cause compression of the spinal  cord with subsequent paralysis.  This would require an emergency surgery to  decompress and there are no guarantees that the patient would recover from the  paralysis.       5. Pneumothorax:  Puncturing of a lung is a possibility, every time a needle is introduced in  the area of the chest or upper back.  Pneumothorax refers to free air around the  collapsed lung(s), inside of the thoracic cavity (chest cavity).  Another two possible  complications related to a similar event would  include: Hemothorax and Chylothorax.   These are variations of the Pneumothorax, where instead of air around the collapsed  lung(s), you may have blood or chyle, respectively.       6. Spinal headaches: They may occur with any procedures in the area of the spine.       7. Persistent CSF (Cerebro-Spinal Fluid) leakage: This is a rare problem, but may occur  with prolonged intrathecal or epidural catheters either due to the formation of a fistulous  track or a dural tear.       8. Nerve damage: By working so close to the spinal cord, there is always a possibility of  nerve damage, which could be as serious as a permanent spinal cord injury with  paralysis.       9. Death:  Although rare, severe deadly allergic reactions known as "Anaphylactic  reaction" can occur to any of the medications used.      10. Worsening of the symptoms:  We can always make thing worse.  What are the chances of something like this happening? Chances of any of this occuring are extremely low.  By statistics,  you have more of a chance of getting killed in a motor vehicle accident: while driving to the hospital than any of the above occurring .  Nevertheless, you should be aware that they are possibilities.  In general, it is similar to taking a shower.  Everybody knows that you can slip, hit your head and get killed.  Does that mean that you should not shower again?  Nevertheless always keep in mind that statistics do not mean anything if you happen to be on the wrong side of them.  Even if a procedure has a 1 (one) in a 1,000,000 (million) chance of going wrong, it you happen to be that one..Also, keep in mind that by statistics, you have more of a chance of having something go wrong when taking medications.  Who should not have this procedure? If you are on a blood thinning medication (e.g. Coumadin, Plavix, see list of "Blood Thinners"), or if you have an active infection going on, you should not have the procedure.  If you are  taking any blood thinners, please inform your physician.  How should I prepare for this procedure?  Do not eat or drink anything at least six hours prior to the procedure.  Bring a driver with you .  It cannot be a taxi.  Come accompanied by an adult that can drive you back, and that is strong enough to help you if your legs get weak or numb from the local anesthetic.  Take all of your medicines the morning of the procedure with just enough water to swallow them.  If you have diabetes, make sure that you are scheduled to have your procedure done first thing in the morning, whenever possible.  If you have diabetes, take only half of your insulin dose and notify our nurse that you have done so as soon as you arrive at the clinic.  If you are diabetic, but only take blood sugar pills (oral hypoglycemic), then do not take them on the morning of your procedure.  You may take them after you have had the procedure.  Do not take aspirin or any aspirin-containing medications, at least eleven (11) days prior to the procedure.  They may prolong bleeding.  Wear loose fitting clothing that may be easy to take off and that you would not mind if it got stained with Betadine or blood.  Do not wear any jewelry or perfume  Remove any nail coloring.  It will interfere with some of our monitoring equipment.  NOTE: Remember that this is not meant to be interpreted as a complete list of all possible complications.  Unforeseen problems may occur.  BLOOD THINNERS The following drugs contain aspirin or other products, which can cause increased bleeding during surgery and should not be taken for 2 weeks prior to and 1 week after surgery.  If you should need take something for relief of minor pain, you may take acetaminophen which is found in Tylenol,m Datril, Anacin-3 and Panadol. It is not blood thinner. The products listed below are.  Do not take any of the products listed below in addition to any listed on  your instruction sheet.  A.P.C or A.P.C with Codeine Codeine Phosphate Capsules #3 Ibuprofen Ridaura  ABC compound Congesprin Imuran rimadil  Advil Cope Indocin Robaxisal  Alka-Seltzer Effervescent Pain Reliever and Antacid Coricidin or Coricidin-D  Indomethacin Rufen  Alka-Seltzer plus Cold Medicine Cosprin Ketoprofen S-A-C Tablets  Anacin Analgesic Tablets or Capsules Coumadin Korlgesic Salflex  Anacin Extra Strength  Analgesic tablets or capsules CP-2 Tablets Lanoril Salicylate  Anaprox Cuprimine Capsules Levenox Salocol  Anexsia-D Dalteparin Magan Salsalate  Anodynos Darvon compound Magnesium Salicylate Sine-off  Ansaid Dasin Capsules Magsal Sodium Salicylate  Anturane Depen Capsules Marnal Soma  APF Arthritis pain formula Dewitt's Pills Measurin Stanback  Argesic Dia-Gesic Meclofenamic Sulfinpyrazone  Arthritis Bayer Timed Release Aspirin Diclofenac Meclomen Sulindac  Arthritis pain formula Anacin Dicumarol Medipren Supac  Analgesic (Safety coated) Arthralgen Diffunasal Mefanamic Suprofen  Arthritis Strength Bufferin Dihydrocodeine Mepro Compound Suprol  Arthropan liquid Dopirydamole Methcarbomol with Aspirin Synalgos  ASA tablets/Enseals Disalcid Micrainin Tagament  Ascriptin Doan's Midol Talwin  Ascriptin A/D Dolene Mobidin Tanderil  Ascriptin Extra Strength Dolobid Moblgesic Ticlid  Ascriptin with Codeine Doloprin or Doloprin with Codeine Momentum Tolectin  Asperbuf Duoprin Mono-gesic Trendar  Aspergum Duradyne Motrin or Motrin IB Triminicin  Aspirin plain, buffered or enteric coated Durasal Myochrisine Trigesic  Aspirin Suppositories Easprin Nalfon Trillsate  Aspirin with Codeine Ecotrin Regular or Extra Strength Naprosyn Uracel  Atromid-S Efficin Naproxen Ursinus  Auranofin Capsules Elmiron Neocylate Vanquish  Axotal Emagrin Norgesic Verin  Azathioprine Empirin or Empirin with Codeine Normiflo Vitamin E  Azolid Emprazil Nuprin Voltaren  Bayer Aspirin plain, buffered or  children's or timed BC Tablets or powders Encaprin Orgaran Warfarin Sodium  Buff-a-Comp Enoxaparin Orudis Zorpin  Buff-a-Comp with Codeine Equegesic Os-Cal-Gesic   Buffaprin Excedrin plain, buffered or Extra Strength Oxalid   Bufferin Arthritis Strength Feldene Oxphenbutazone   Bufferin plain or Extra Strength Feldene Capsules Oxycodone with Aspirin   Bufferin with Codeine Fenoprofen Fenoprofen Pabalate or Pabalate-SF   Buffets II Flogesic Panagesic   Buffinol plain or Extra Strength Florinal or Florinal with Codeine Panwarfarin   Buf-Tabs Flurbiprofen Penicillamine   Butalbital Compound Four-way cold tablets Penicillin   Butazolidin Fragmin Pepto-Bismol   Carbenicillin Geminisyn Percodan   Carna Arthritis Reliever Geopen Persantine   Carprofen Gold's salt Persistin   Chloramphenicol Goody's Phenylbutazone   Chloromycetin Haltrain Piroxlcam   Clmetidine heparin Plaquenil   Cllnoril Hyco-pap Ponstel   Clofibrate Hydroxy chloroquine Propoxyphen         Before stopping any of these medications, be sure to consult the physician who ordered them.  Some, such as Coumadin (Warfarin) are ordered to prevent or treat serious conditions such as "deep thrombosis", "pumonary embolisms", and other heart problems.  The amount of time that you may need off of the medication may also vary with the medication and the reason for which you were taking it.  If you are taking any of these medications, please make sure you notify your pain physician before you undergo any procedures.

## 2017-03-03 NOTE — Progress Notes (Signed)
Safety precautions to be maintained throughout the outpatient stay will include: orient to surroundings, keep bed in low position, maintain call bell within reach at all times, provide assistance with transfer out of bed and ambulation.  

## 2017-03-03 NOTE — Progress Notes (Signed)
Patient's Name: James Friedman  MRN: [id number]  Referring Provider: Gareth Morgan, MD  DOB: [birth year]  PCP: Gareth Morgan, MD  DOS: 03/03/2017  Note by: Kathlen Brunswick. Dossie Arbour, MD  Service setting: Ambulatory outpatient  Specialty: Interventional Pain Management  Location: [facility] (AMB) Pain Management Facility    Patient type: Established   Primary Reason(s) for Visit: Encounter for prescription drug management & post-procedure evaluation of chronic illness with mild to moderate exacerbation(Level of risk: moderate) CC: Back Pain (low) and Knee Pain (bilateral, right is worse)  HPI  James Friedman is a 71 y.o. year old, male patient, who comes today for a post-procedure evaluation and medication management. He has Hyperlipidemia; Chronic shoulder pain (Bilateral) (L>R); Non-traumatic rotator cuff tear; Chronic pain syndrome; Chronic low back pain (Location of Primary Source of Pain) (Bilateral) (L>R); Lumbosacral radiculopathy at S1 (Right side); Lumbar facet syndrome (Bilateral) (L>R); Chronic neck pain (Bilateral) (R>L); Chronic cervical radicular pain (Bilateral) (R>L); Cervical spondylosis; Morbid obesity ([place]); Long term current use of opiate analgesic; Long term prescription opiate use; Opiate use; Opiate dependence ([place]); Chronic sacroiliac joint pain (Bilateral) (R>L); Failed neck surgery syndrome; Chronic upper extremity pain (Bilateral) (R>L); History of bilateral knee surgery; Diffuse myofascial pain syndrome; Lumbar facet hypertrophy (L4-5 and L5-S1); Carpal tunnel syndrome on right (Released); Transient weakness of left lower extremity; At high risk for falls; Lumbar spinal stenosis (L3-4); 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); Grade 1 Anterolisthesis of L4 over L5; Lumbar disc herniation (Left L4-5) (Right L5-S1); Chronic lower extremity pain (Location of Secondary source of pain) (Bilateral) (R>L); Essential hypertension; Diabetes mellitus without complication ([place]); Atypical chest  pain; Chronic knee pain (Bilateral); Diabetic peripheral neuropathy ([place]); Chronic lumbar radicular pain (Location of Secondary source of pain) (Bilateral) (R>L) (S1 Dermatome on the left); Abnormal EMG/PNCV (11/26/2015); Postoperative pain (post-radiofrequency); Lumbar spondylosis; COPD 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 ([place]); Polyneuropathy; Osteoarthritis of knee (Bilateral); Osteoarthritis of shoulder (Bilateral); Chronic shoulder Arthropathy (Bilateral); and Myofascial pain syndrome, cervical on his problem list. His primarily concern today is the Back Pain (low) and Knee Pain (bilateral, right is worse)  Pain Assessment: Self-Reported Pain Score: 7 /10 Clinically the patient looks like a 3/10 Reported level is inconsistent with clinical observations. Information on the proper use of the pain scale provided to the patient today Pain Type: Chronic pain Pain Location: Back (legs) Pain Orientation: Lower Pain Descriptors / Indicators: Aching, Sharp, Tingling Pain Frequency: Constant  James Friedman was last seen on 02/16/2017 for a procedure. During today's appointment we reviewed James Friedman post-procedure results, as well as his outpatient medication regimen. The patient has requested that we take over his medication management today. We have reminded him that once we do this, he cannot have any opioids prescribed from any other physician. Today we have provided him with a copy of our medication policy summary.  Further details on both, my assessment(s), as well as the proposed treatment plan, please see below.  Controlled Substance Pharmacotherapy Assessment REMS (Risk Evaluation and Mitigation Strategy)  Analgesic: Morphine ER 15 mg every 12 hours (30 mg/day of morphine) + oxycodone IR 5 mg every 6 (20 mg/day of oxycodone) (total: 60 MME) MME/day: 60 mg/day.  Dewayne Shorter, RN  03/03/2017 10:52 AM  Signed Safety precautions to be maintained throughout the outpatient stay will include: orient to  surroundings, keep bed in low position, maintain call bell within reach at all times, provide assistance with transfer out of bed and ambulation.   Pharmacokinetics: Liberation and absorption (onset of action): WNL  Distribution (time to peak effect): WNL Metabolism and excretion (duration of action): WNL         Pharmacodynamics: Desired effects: Analgesia: James Friedman reports >50% benefit. Functional ability: Patient reports that medication allows him to accomplish basic ADLs Clinically meaningful improvement in function (CMIF): Sustained CMIF goals met Perceived effectiveness: Described as relatively effective, allowing for increase in activities of daily living (ADL) Undesirable effects: Side-effects or Adverse reactions: None reported Monitoring: [place] PMP: Online review of the past 75-monthperiod conducted. Compliant with practice rules and regulations List of all UDS test(s) done:  Lab Results  Component Value Date   TOXASSSELUR FINAL 07/17/2015   Last UDS on record: ToxAssure Select 13  Date Value Ref Range Status  07/17/2015 FINAL  Final    Comment:    ==================================================================== TOXASSURE SELECT 13 (MW) ==================================================================== Test                             Result       Flag       Units Drug Present and Declared for Prescription Verification   Oxycodone                      1030         EXPECTED   ng/mg creat   Oxymorphone                    466          EXPECTED   ng/mg creat   Noroxycodone                   1183         EXPECTED   ng/mg creat   Noroxymorphone                 139          EXPECTED   ng/mg creat    Sources of oxycodone are scheduled prescription medications.    Oxymorphone, noroxycodone, and noroxymorphone are expected    metabolites of oxycodone. Oxymorphone is also available as a    scheduled prescription medication. Drug Present not Declared for Prescription  Verification   Alcohol, Ethyl                 0.024        UNEXPECTED g/dL    Sources of ethyl alcohol include alcoholic beverages or as a    fermentation product of glucose; glucose is present in this    specimen.  Interpret result with caution, as the presence of    ethyl alcohol is likely due, at least in part, to fermentation of    glucose.   Morphine                       4380         UNEXPECTED ng/mg creat    Potential sources of large amounts of morphine in the absence of    codeine include administration of morphine or use of heroin.   Hydromorphone                  69           UNEXPECTED ng/mg creat    Hydromorphone may be present as a metabolite of morphine;    concentrations of hydromorphone rarely exceed 5% of the morphine    concentration when this is the source  of hydromorphone. ==================================================================== Test                      Result    Flag   Units      Ref Range   Creatinine              122              mg/dL      >=20 ==================================================================== Declared Medications:  The flagging and interpretation on this report are based on the  following declared medications.  Unexpected results may arise from  inaccuracies in the declared medications.  **Note: The testing scope of this panel includes these medications:  Oxycodone  **Note: The testing scope of this panel does not include following  reported medications:  Duloxetine (Cymbalta)  Gabapentin ==================================================================== For clinical consultation, please call [phone]. ====================================================================    UDS interpretation: Compliant          Medication Assessment Form: Reviewed. Patient indicates being compliant with therapy Treatment compliance: Compliant Risk Assessment Profile: Aberrant behavior: See prior evaluations. None observed or  detected today Comorbid factors increasing risk of overdose: See prior notes. No additional risks detected today Risk of substance use disorder (SUD): Low Opioid Risk Tool (ORT) Total Score:    Interpretation Table:  Score <3 = Low Risk for SUD  Score between 4-7 = Moderate Risk for SUD  Score >8 = High Risk for Opioid Abuse   Risk Mitigation Strategies:  Patient Counseling: Covered Patient-Prescriber Agreement (PPA): Present and active  Notification to other healthcare providers: Done  Pharmacologic Plan: No change in therapy, at this time  Post-Procedure Assessment  02/16/2017 Procedure: Diagnostic bilateral suprascapular nerve block under fluoroscopic guidance, no sedation. Pre-procedure pain score:  6/10 Post-procedure pain score: 0/10 (100% relief) Influential Factors: BMI: 41.35 kg/m Intra-procedural challenges: None observed Assessment challenges: None detected         Post-procedural adverse reactions or complications: None reported Reported side-effects: None  Sedation: Please see nurses note. When no sedatives are used, the analgesic levels obtained are directly associated to the effectiveness of the local anesthetics. However, when sedation is provided, the level of analgesia obtained during the initial 1 hour following the intervention, is believed to be the result of a combination of factors. These factors may include, but are not limited to: 1. The effectiveness of the local anesthetics used. 2. The effects of the analgesic(s) and/or anxiolytic(s) used. 3. The degree of discomfort experienced by the patient at the time of the procedure. 4. The patients ability and reliability in recalling and recording the events. 5. The presence and influence of possible secondary gains and/or psychosocial factors. Reported result: Relief experienced during the 1st hour after the procedure: 100 % (Ultra-Short Term Relief) Interpretative annotation: Analgesia during this period is  likely to be Local Anesthetic and/or IV Sedative (Analgesic/Anxiolitic) related.          Effects of local anesthetic: The analgesic effects attained during this period are directly associated to the localized infiltration of local anesthetics and therefore cary significant diagnostic value as to the etiological location, or anatomical origin, of the pain. Expected duration of relief is directly dependent on the pharmacodynamics of the local anesthetic used. Long-acting (4-6 hours) anesthetics used.  Reported result: Relief during the next 4 to 6 hour after the procedure: 100 % (Short-Term Relief) Interpretative annotation: Complete relief would suggest area to be the source of the pain.  Long-term benefit: Defined as the period of time past the expected duration of local anesthetics. With the possible exception of prolonged sympathetic blockade from the local anesthetics, benefits during this period are typically attributed to, or associated with, other factors such as analgesic sensory neuropraxia, antiinflammatory effects, or beneficial biochemical changes provided by agents other than the local anesthetics Reported result: Extended relief following procedure: 100 % (ongoing) (Long-Term Relief) Interpretative annotation: Good relief. This could suggest inflammation to be a significant component in the etiology to the pain.          Current benefits: Defined as persistent relief that continues at this point in time.   Reported results: Treated area: 100 % Mr. Leu reports improvement in function Interpretative annotation: Ongoing benefits would suggest effective therapeutic approach  Interpretation: Results would suggest a successful diagnostic intervention.          Laboratory Chemistry  Inflammation Markers Lab Results  Component Value Date   ESRSEDRATE 7 10/11/2013   (CRP: Acute Phase) (ESR: Chronic Phase) Renal Function Markers Lab Results  Component Value Date   BUN 16  10/11/2013   CREATININE 1.13 10/11/2013   GFRAA >60 10/11/2013   GFRNONAA >60 10/11/2013   Hepatic Function Markers Lab Results  Component Value Date   AST 20 10/11/2013   ALT 28 10/11/2013   ALBUMIN 3.8 10/11/2013   ALKPHOS 67 10/11/2013   Electrolytes Lab Results  Component Value Date   NA 135 (L) 10/11/2013   K 4.6 10/11/2013   CL 98 10/11/2013   CALCIUM 8.5 10/11/2013   MG 1.8 05/24/2014   Neuropathy Markers No results found for: [id number] Bone Pathology Markers Lab Results  Component Value Date   ALKPHOS 67 10/11/2013   CALCIUM 8.5 10/11/2013   Coagulation Parameters No results found for: INR, LABPROT, APTT, PLT Cardiovascular Markers No results found for: BNP, HGB, HCT Note: Lab results reviewed.  Recent Diagnostic Imaging Review  Dg C-arm 1-60 Min-no Report  Result Date: 02/16/2017 Fluoroscopy was utilized by the requesting physician.  No radiographic interpretation.   Note: Imaging results reviewed.          Meds  The patient has a current medication list which includes the following prescription(s): acetaminophen, albuterol, aluminum hydroxide, amlodipine, antiseptic oral rinse, aspirin, atorvastatin, azelastine, capsaicin, carboxymethylcellul-glycerin, cetirizine, chlorpheniramine, clotrimazole, diclofenac sodium, docusate sodium, duloxetine, ergocalciferol, fluticasone, gabapentin, glucagon, hydroxyzine, insulin aspart, insulin detemir, lidocaine, lisinopril, magnesium, metformin, methocarbamol, metoprolol tartrate, mometasone, morphine, morphine, morphine, multivitamin, naloxone, naproxen, fish oil, omeprazole, oxycodone, oxycodone, oxycodone, oxygen-helium, pantoprazole, polyethylene glycol powder, promethazine, senna, sitagliptin-metformin, testosterone cypionate, trazodone, vitamin c, and vitamin a & d.  Current Outpatient Prescriptions on File Prior to Visit  Medication Sig  . acetaminophen (TYLENOL) 325 MG tablet Take 650 mg by mouth as needed.   Marland Kitchen  albuterol (PROAIR HFA) 108 (90 BASE) MCG/ACT inhaler Inhale 2 puffs into the lungs every 6 (six) hours as needed for wheezing.  Marland Kitchen aluminum hydroxide (AMPHOJEL/ALTERNAGEL) 320 MG/5ML suspension Take 30 mLs by mouth every 6 (six) hours as needed for indigestion or heartburn.  Marland Kitchen amLODipine (NORVASC) 5 MG tablet Take 5 mg by mouth daily.   Marland Kitchen antiseptic oral rinse (BIOTENE) LIQD 15 mLs by Mouth Rinse route as needed for dry mouth.  Marland Kitchen aspirin 81 MG chewable tablet Chew by mouth.  Marland Kitchen atorvastatin (LIPITOR) 80 MG tablet Take 80 mg by mouth daily.  Marland Kitchen azelastine (ASTELIN) 0.1 % nasal spray Place 1 spray into both nostrils as needed. Use in each nostril as directed   .  capsaicin (ZOSTRIX) 0.025 % cream Apply 1 application topically 2 (two) times daily.  . Carboxymethylcellul-Glycerin (OPTIVE) 0.5-0.9 % SOLN Apply 1 drop to eye daily.  . cetirizine (ZYRTEC) 10 MG tablet Take 10 mg by mouth daily.  . chlorpheniramine (CHLOR-TRIMETON) 4 MG tablet Take 4 mg by mouth every 6 (six) hours as needed for allergies.  . Clotrimazole 1 % OINT Apply 1 application topically 2 (two) times daily.   . diclofenac sodium (VOLTAREN) 1 % GEL Apply topically 2 (two) times daily as needed.  . docusate sodium (COLACE) 100 MG capsule Take 100 mg by mouth daily as needed for mild constipation.  . DULoxetine (CYMBALTA) 30 MG capsule Take 30 mg by mouth 2 (two) times daily.  . ergocalciferol (VITAMIN D2) 50000 units capsule Take 50,000 Units by mouth every 30 (thirty) days.  . fluticasone (FLONASE) 50 MCG/ACT nasal spray   . gabapentin (NEURONTIN) 600 MG tablet Take 600 mg by mouth 2 (two) times daily. One capsule in the am and two capsules at night  . glucagon (GLUCAGON EMERGENCY) 1 MG injection Inject 1 mg into the vein once as needed.  . hydrOXYzine (ATARAX/VISTARIL) 25 MG tablet Take 25 mg by mouth every 6 (six) hours as needed for itching.  . insulin aspart (NOVOLOG) 100 UNIT/ML injection Inject 70 Units into the skin 4 (four)  times daily -  before meals and at bedtime.   . insulin detemir (LEVEMIR) 100 UNIT/ML injection Inject 80 Units into the skin 2 (two) times daily.   Marland Kitchen lidocaine (LIDODERM) 5 % Place 1 patch onto the skin daily. Remove & Discard patch within 12 hours or as directed by MD  . lisinopril (PRINIVIL,ZESTRIL) 10 MG tablet Take 10 mg by mouth daily.  . Magnesium 400 MG TABS Take 1 tablet by mouth 2 (two) times daily.   . metFORMIN (GLUCOPHAGE) 1000 MG tablet Take 1,000 mg by mouth 2 (two) times daily with a meal.  . methocarbamol (ROBAXIN) 500 MG tablet Take 500 mg by mouth 2 (two) times daily.   . metoprolol (LOPRESSOR) 100 MG tablet Take 100 mg by mouth 2 (two) times daily.  . mometasone (NASONEX) 50 MCG/ACT nasal spray Place 2 sprays into the nose daily.  . Multiple Vitamin (MULTIVITAMIN) tablet Take 1 tablet by mouth daily.  . naloxone (NARCAN) nasal spray 4 mg/0.1 mL Place 1 spray into the nose.  . naproxen (NAPROSYN) 500 MG tablet Take 500 mg by mouth 2 (two) times daily with a meal.  . Omega-3 Fatty Acids (FISH OIL) 1000 MG CAPS Take by mouth 2 (two) times daily.  Marland Kitchen omeprazole (PRILOSEC) 20 MG capsule Take 20 mg by mouth daily.  . OXYGEN Inhale 3 L into the lungs at bedtime.  . pantoprazole (PROTONIX) 40 MG tablet Take 40 mg by mouth.  . polyethylene glycol powder (GLYCOLAX/MIRALAX) powder Take by mouth as needed.   . promethazine (PHENERGAN) 12.5 MG tablet Take by mouth.  . senna (SENOKOT) 8.6 MG TABS tablet Take 1 tablet by mouth daily as needed for mild constipation.  . sitaGLIPtin-metformin (JANUMET) 50-1000 MG tablet Take 1 tablet by mouth 2 (two) times daily with a meal.  . testosterone cypionate (DEPOTESTOTERONE CYPIONATE) 200 MG/ML injection Inject 200 mg into the muscle every 14 (fourteen) days.  . traZODone (DESYREL) 150 MG tablet 150 mg daily.   . vitamin C (ASCORBIC ACID) 500 MG tablet Take 500 mg by mouth daily.  . Vitamins A & D (VITAMIN A & D) ointment Apply 1 application  topically as needed for dry skin.   No current facility-administered medications on file prior to visit.    ROS  Constitutional: Denies any fever or chills Gastrointestinal: No reported hemesis, hematochezia, vomiting, or acute GI distress Musculoskeletal: Denies any acute onset joint swelling, redness, loss of ROM, or weakness Neurological: No reported episodes of acute onset apraxia, aphasia, dysarthria, agnosia, amnesia, paralysis, loss of coordination, or loss of consciousness  Allergies  Mr. Rossin has No Known Allergies.  PFSH  Drug: Mr. Bord  reports that he does not use drugs. Alcohol:  reports that he does not drink alcohol. Tobacco:  reports that he has quit smoking. His smoking use included Cigarettes. He has a 3.75 pack-year smoking history. His smokeless tobacco use includes Chew. Medical:  has a past medical history of Anxiety; Asthma; Atypical chest pain; Back pain; BPH (benign prostatic hypertrophy); Carpal tunnel syndrome; Carpal tunnel syndrome on right (07/17/2015); Chest pain (07/26/2013); DDD (degenerative disc disease); Diabetes mellitus without complication ([place]); Essential hypertension; Falls; GERD (gastroesophageal reflux disease); Heme positive stool; History of bilateral knee surgery (07/17/2015); Hyperlipidemia; Hypertension; Morbid obesity ([place]); OA (osteoarthritis); OSA (obstructive sleep apnea); Ventral hernia; and Wears dentures. Family: family history includes Heart attack in his father.  Past Surgical History:  Procedure Laterality Date  . CARPAL TUNNEL RELEASE    . CATARACT EXTRACTION EXTRACAPSULAR Left 08/04/2015   Procedure: CATARACT EXTRACTION EXTRACAPSULAR WITH INTRAOCULAR LENS PLACEMENT (IOC);  Surgeon: Ronnell Freshwater, MD;  Location: [place];  Service: Ophthalmology;  Laterality: Left;  DIABETIC - insulin and oral meds  . CATARACT EXTRACTION W/PHACO Right 04/19/2016   Procedure: CATARACT EXTRACTION PHACO AND INTRAOCULAR LENS  PLACEMENT (IOC);  Surgeon: Ronnell Freshwater, MD;  Location: [place];  Service: Ophthalmology;  Laterality: Right;  RIGHT DIABETIC - insulin and oral meds Sleep apnea  . KNEE SURGERY Bilateral   . NECK SURGERY    . SHOULDER SURGERY Right    Constitutional Exam  General appearance: Well nourished, well developed, and well hydrated. In no apparent acute distress Vitals:   03/03/17 1042 03/03/17 1045  BP:  (!) 124/96  Pulse: 80   Resp: 18   Temp: 99.1 F (37.3 C)   SpO2: 98%   Weight: 280 lb (127 kg)   Height: 5' 9" (1.753 m)    BMI Assessment: Estimated body mass index is 41.35 kg/m as calculated from the following:   Height as of this encounter: 5' 9" (1.753 m).   Weight as of this encounter: 280 lb (127 kg).  BMI interpretation table: BMI level Category Range association with higher incidence of chronic pain  <18 kg/m2 Underweight   18.5-24.9 kg/m2 Ideal body weight   25-29.9 kg/m2 Overweight Increased incidence by 20%  30-34.9 kg/m2 Obese (Class I) Increased incidence by 68%  35-39.9 kg/m2 Severe obesity (Class II) Increased incidence by 136%  >40 kg/m2 Extreme obesity (Class III) Increased incidence by 254%   BMI Readings from Last 4 Encounters:  03/03/17 41.35 kg/m  02/16/17 41.64 kg/m  02/02/17 41.64 kg/m  01/20/17 44.12 kg/m   Wt Readings from Last 4 Encounters:  03/03/17 280 lb (127 kg)  02/16/17 282 lb (127.9 kg)  02/02/17 282 lb (127.9 kg)  01/20/17 298 lb 12 oz (135.5 kg)  Psych/Mental status: Alert, oriented x 3 (person, place, & time)       Eyes: PERLA Respiratory: No evidence of acute respiratory distress  Cervical Spine Exam  Inspection: No masses, redness, or swelling Alignment: Symmetrical Functional ROM: Unrestricted  ROM      Stability: No instability detected Muscle strength & Tone: Functionally intact Sensory: Unimpaired Palpation: No palpable anomalies              Upper Extremity (UE) Exam    Side: Right upper  extremity  Side: Left upper extremity  Inspection: No masses, redness, swelling, or asymmetry. No contractures  Inspection: No masses, redness, swelling, or asymmetry. No contractures  Functional ROM: Improved after treatment for shoulder  Functional ROM: Improved after treatment for shoulder  Muscle strength & Tone: Functionally intact  Muscle strength & Tone: Functionally intact  Sensory: Unimpaired  Sensory: Unimpaired  Palpation: No palpable anomalies              Palpation: No palpable anomalies              Specialized Test(s): Deferred         Specialized Test(s): Deferred          Thoracic Spine Exam  Inspection: No masses, redness, or swelling Alignment: Symmetrical Functional ROM: Unrestricted ROM Stability: No instability detected Sensory: Unimpaired Muscle strength & Tone: No palpable anomalies  Lumbar Spine Exam  Inspection: No masses, redness, or swelling Alignment: Symmetrical Functional ROM: Unrestricted ROM      Stability: No instability detected Muscle strength & Tone: Functionally intact Sensory: Unimpaired Palpation: No palpable anomalies       Provocative Tests: Lumbar Hyperextension and rotation test: evaluation deferred today       Patrick's Maneuver: evaluation deferred today                    Gait & Posture Assessment  Ambulation: Patient came in today in a wheel chair Gait: Very limited, using assistive device to ambulate Posture: Antalgic   Lower Extremity Exam    Side: Right lower extremity  Side: Left lower extremity  Inspection: No masses, redness, swelling, or asymmetry. No contractures  Inspection: No masses, redness, swelling, or asymmetry. No contractures  Functional ROM: Decreased ROM for knee joint  Functional ROM: Decreased ROM for knee joint  Muscle strength & Tone: Functionally intact  Muscle strength & Tone: Functionally intact  Sensory: Unimpaired  Sensory: Unimpaired  Palpation: No palpable anomalies  Palpation: No palpable anomalies     Assessment  Primary Diagnosis & Pertinent Problem List: The primary encounter diagnosis was Chronic knee pain (Bilateral). Diagnoses of Osteoarthritis of knee (Bilateral), Chronic shoulder pain (Bilateral) (L>R), Chronic shoulder Arthropathy (Bilateral), Osteoarthritis of shoulder (Bilateral), Chronic pain syndrome, Long term prescription opiate use, Opiate use, and Long term current use of opiate analgesic were also pertinent to this visit.  Status Diagnosis  Persistent Unimproved Improved 1. Chronic knee pain (Bilateral)   2. Osteoarthritis of knee (Bilateral)   3. Chronic shoulder pain (Bilateral) (L>R)   4. Chronic shoulder Arthropathy (Bilateral)   5. Osteoarthritis of shoulder (Bilateral)   6. Chronic pain syndrome   7. Long term prescription opiate use   8. Opiate use   9. Long term current use of opiate analgesic     Problems updated and reviewed during this visit: No problems updated. Plan of Care  Pharmacotherapy (Medications Ordered): Meds ordered this encounter  Medications  . morphine (MS CONTIN) 15 MG 12 hr tablet    Sig: Take 1 tablet (15 mg total) by mouth every 12 (twelve) hours.    Dispense:  60 tablet    Refill:  0    Fill one day early if pharmacy is closed on scheduled refill  date. Do not fill until: 03/09/17 To last until: 04/08/17  . morphine (MS CONTIN) 15 MG 12 hr tablet    Sig: Take 1 tablet (15 mg total) by mouth every 12 (twelve) hours.    Dispense:  60 tablet    Refill:  0    Fill one day early if pharmacy is closed on scheduled refill date. Do not fill until: 04/08/17 To last until: 05/08/17  . morphine (MS CONTIN) 15 MG 12 hr tablet    Sig: Take 1 tablet (15 mg total) by mouth every 12 (twelve) hours.    Dispense:  60 tablet    Refill:  0    Fill one day early if pharmacy is closed on scheduled refill date. Do not fill until: 05/08/17 To last until: 06/07/17  . oxyCODONE (OXY IR/ROXICODONE) 5 MG immediate release tablet    Sig: Take 1  tablet (5 mg total) by mouth every 6 (six) hours as needed for severe pain.    Dispense:  120 tablet    Refill:  0    Fill one day early if pharmacy is closed on scheduled refill date. Do not fill until: 03/09/17 To last until: 04/08/17  . oxyCODONE (OXY IR/ROXICODONE) 5 MG immediate release tablet    Sig: Take 1 tablet (5 mg total) by mouth every 4 (four) hours as needed for severe pain.    Dispense:  120 tablet    Refill:  0    Fill one day early if pharmacy is closed on scheduled refill date. Do not fill until: 04/08/17 To last until: 05/08/17  . oxyCODONE (OXY IR/ROXICODONE) 5 MG immediate release tablet    Sig: Take 1 tablet (5 mg total) by mouth every 4 (four) hours as needed for severe pain.    Dispense:  120 tablet    Refill:  0    Fill one day early if pharmacy is closed on scheduled refill date. Do not fill until: 05/08/17 To last until: 06/07/17   New Prescriptions   MORPHINE (MS CONTIN) 15 MG 12 HR TABLET    Take 1 tablet (15 mg total) by mouth every 12 (twelve) hours.   MORPHINE (MS CONTIN) 15 MG 12 HR TABLET    Take 1 tablet (15 mg total) by mouth every 12 (twelve) hours.   MORPHINE (MS CONTIN) 15 MG 12 HR TABLET    Take 1 tablet (15 mg total) by mouth every 12 (twelve) hours.   OXYCODONE (OXY IR/ROXICODONE) 5 MG IMMEDIATE RELEASE TABLET    Take 1 tablet (5 mg total) by mouth every 6 (six) hours as needed for severe pain.   OXYCODONE (OXY IR/ROXICODONE) 5 MG IMMEDIATE RELEASE TABLET    Take 1 tablet (5 mg total) by mouth every 4 (four) hours as needed for severe pain.   OXYCODONE (OXY IR/ROXICODONE) 5 MG IMMEDIATE RELEASE TABLET    Take 1 tablet (5 mg total) by mouth every 4 (four) hours as needed for severe pain.   Medications administered today: Mr. Colomb had no medications administered during this visit. Lab-work, procedure(s), and/or referral(s): Orders Placed This Encounter  Procedures  . KNEE INJECTION  . SUPRASCAPULAR NERVE BLOCK  . ToxASSURE Select 13 (MW),  Urine   Imaging and/or referral(s): None  Interventional therapies: Planned, scheduled, and/or pending:   Diagnostic bilateral intra-articular knee injection with local anesthetic and steroid, no fluoroscopy or sedation.    Considering:   Diagnostic bilateral suprascapular nerve block  Diagnosticbilateral series of 5 intra-articular Hyalgan knee injection  Palliative bilateral lumbar facet radiofrequency ablation. (Right done 07/08/2016; 10/02/15 before that. Left  done 12/09/2015)  Right intra-articular shoulder injection  Possible intra-articular right knee injection  Palliative right cervical epidural steroid injection  Palliative bilateral genicular nerve RFA.    Palliative PRN treatment(s):   Palliative bilateral lumbar facet block  Palliative bilateral intra-articular Hyalgan knee injection    Provider-requested follow-up: Return in about 3 months (around 06/03/2017) for NS procedure, (ASAP), w/ MD, in addition, Med-Mgmt, (3 mo), by NP.  Future Appointments Date Time Provider [place]  03/07/2017 1:45 PM Milinda Pointer, MD [facility] None  06/02/2017 9:30 AM Vevelyn Francois, NP [facility] None   Primary Care Physician: Gareth Morgan, MD Location: [facility] Note by: Kathlen Brunswick Dossie Arbour, M.D, DABA, DABAPM, DABPM, DABIPP, FIPP Date: 03/03/2017; Time: 11:52 AM  Patient instructions provided during this appointment: Patient Instructions    ____________________________________________________________________________________________  Medication Rules  Applies to: All patients receiving prescriptions (written or electronic).  Pharmacy of record: Pharmacy where electronic prescriptions will be sent. If written prescriptions are taken to a different pharmacy, please inform the nursing staff. The pharmacy listed in the electronic medical record should be the one where you would like electronic prescriptions to be sent.  Prescription  refills: Only during scheduled appointments. Applies to both, written and electronic prescriptions.  NOTE: The following applies primarily to controlled substances (Opioid Pain Medications)  Patient's responsibilities: 1. Pain Pills: Bring all pain pills to every appointment (except for procedure appointments). 2. Pill Bottles: Bring pills in original pharmacy bottle. Always bring newest bottle. Bring bottle, even if empty. 3. Medication refills: You are responsible for knowing and keeping track of what medications you need refilled. The day before your appointment, write a list of all prescriptions that need to be refilled. Bring that list to your appointment and give it to the admitting nurse. Prescriptions will be written only during appointments. If you forget a medication, it will not be "Called in", "Faxed", or "electronically sent". You will need to get another appointment to get these prescribed. 4. Prescription Accuracy: You are responsible for carefully inspecting your prescriptions before leaving our office. Have the discharge nurse carefully go over each prescription with you, before taking them home. Make sure that your name is accurately spelled, that your address is correct. Check the name and dose of your medication to make sure it is accurate. Check the number of pills, and the written instructions to make sure they are clear and accurate. Make sure that you are given enough medication to last until your next medication refill appointment. 5. Taking Medication: Take medication as prescribed. Never take more pills than instructed. Never take medication more frequently than prescribed. Taking less pills or less frequently is permitted and encouraged, when it comes to controlled substances (written prescriptions).  6. Inform other Doctors: Always inform, all of your healthcare providers, of all the medications you take. 7. Pain Medication from other Providers: You are not allowed to accept  any additional pain medication from any other Doctor or Healthcare provider. There are two exceptions to this rule. (see below) In the event that you require additional pain medication, you are responsible for notifying us, as stated below. 8. Medication Agreement: You are responsible for carefully reading and following our Medication Agreement. This must be signed before receiving any prescriptions from our practice. Safely store a copy of your signed Agreement. Violations to the Agreement will result in no further prescriptions. (Additional copies of our  Medication Agreement are available upon request.) 9. Laws, Rules, & Regulations: All patients are expected to follow all Federal and [employer], Rules, [employer]. Ignorance of the Laws does not constitute a valid excuse.  Exceptions: There are only two exceptions to the rule of not receiving pain medications from other Healthcare Providers. 1. Exception #1 (Emergencies): In the event of an emergency (i.e.: accident requiring emergency care), you are allowed to receive additional pain medication. However, you are responsible for: As soon as you are able, call our office [phone], at any time of the day or night, and leave a message stating your name, the date and nature of the emergency, and the name and dose of the medication prescribed. In the event that your call is answered by a member of our staff, make sure to document and save the date, time, and the name of the person that took your information.  2. Exception #2 (Planned Surgery): In the event that you are scheduled by another doctor or dentist to have any type of surgery or procedure, you are allowed (for a period no longer than 30 days), to receive additional pain medication, for the acute post-op pain. However, in this case, you are responsible for picking up a copy of our "Post-op Pain Management for Surgeons" handout, and giving it to your surgeon or dentist. This document is  available at our office, and does not require an appointment to obtain it. Simply go to our office during business hours (Monday-Thursday from 8:00 AM to 4:00 PM) (Friday 8:00 AM to 12:00 Noon) or if you have a scheduled appointment with [place], prior to your surgery, and ask for it by name. In addition, you will need to provide [place] with your name, name of your surgeon, type of surgery, and date of procedure or surgery.  ____________________________________________________________________________________________  ____________________________________________________________________________________________  Appointment Policy  It is our goal and responsibility to provide the medical community with assistance in the evaluation and management of patients with chronic pain. Unfortunately our resources are limited. Because we do not have an unlimited amount of time, or available appointments, we are required to closely monitor and manage their use. The following rules exist to maximize their use:  Patient's responsibilities: 1. Punctuality: You are required to be physically present and registered in our facility at least 30 minutes before your appointment. 2. Tardiness: The cutoff is your appointment time. If you have an appointment scheduled for 10:00 AM and you arrive at 10:01, you will be required to reschedule your appointment.  3. Plan ahead: Always assume that you will encounter traffic on your way in. Plan for it. If you are dependent on a driver, make sure they understand these rules and the need to arrive early. 4. Other appointments and responsibilities: Avoid scheduling any other appointments before or after your pain clinic appointments.  5. Be prepared: Write down everything that you need to discuss with your healthcare provider and give this information to the admitting nurse. Write down the medications that you will need refilled. Bring your pills and bottles (even the empty ones), to all of your  appointments, except for those where a procedure is scheduled. 6. No children or pets: Find someone to take care of them. It is not appropriate to bring them in. 7. Scheduling changes: We request "advanced notification" of any changes or cancellations. 8. Advanced notification: Defined as a time period of more than 24 hours prior to the originally scheduled appointment. This allows for the  appointment to be offered to other patients. 9. Rescheduling: When a visit is rescheduled, it will require the cancellation of the original appointment. For this reason they both fall within the category of "Cancellations".  10. Cancellations: They require advanced notification. Any cancellation less than 24 hours before the  appointment will be recorded as a "No Show". 11. No Show: Defined as an unkept appointment where the patient failed to notify or declare to the practice their intention or inability to keep the appointment.  Corrective process for repeat offenders:  1. Tardiness: Three (3) episodes of rescheduling due to late arrivals will be recorded as one (1) "No Show". 2. Cancellation or reschedule: Three (3) cancellations or rescheduling will be recorded as one (1) "No Show". 3. "No Shows": Three (3) "No Shows" within a 12 month period will result in discharge from the practice.  ____________________________________________________________________________________________  ____________________________________________________________________________________________  Pain Scale  Introduction: The pain score used by this practice is the Verbal Numerical Rating Scale (VNRS-11). This is an 11-point scale. It is for adults and children 10 years or older. There are significant differences in how the pain score is reported, used, and applied. Forget everything you learned in the past and learn this scoring system.  General Information: The scale should reflect your current level of pain. Unless you are  specifically asked for the level of your worst pain, or your average pain. If you are asked for one of these two, then it should be understood that it is over the past 24 hours.  Basic Activities of Daily Living (ADL): Personal hygiene, dressing, eating, transferring, and using restroom.  Instructions: Most patients tend to report their level of pain as a combination of two factors, their physical pain and their psychosocial pain. This last one is also known as "suffering" and it is reflection of how physical pain affects you socially and psychologically. From now on, report them separately. From this point on, when asked to report your pain level, report only your physical pain. Use the following table for reference.  Pain Clinic Pain Levels (0-5/10)  Pain Level Score  Description  No Pain 0   Mild pain 1 Nagging, annoying, but does not interfere with basic activities of daily living (ADL). Patients are able to eat, bathe, get dressed, toileting (being able to get on and off the toilet and perform personal hygiene functions), transfer (move in and out of bed or a chair without assistance), and maintain continence (able to control bladder and bowel functions). Blood pressure and heart rate are unaffected. A normal heart rate for a healthy adult ranges from 60 to 100 bpm (beats per minute).   Mild to moderate pain 2 Noticeable and distracting. Impossible to hide from other people. More frequent flare-ups. Still possible to adapt and function close to normal. It can be very annoying and may have occasional stronger flare-ups. With discipline, patients may get used to it and adapt.   Moderate pain 3 Interferes significantly with activities of daily living (ADL). It becomes difficult to feed, bathe, get dressed, get on and off the toilet or to perform personal hygiene functions. Difficult to get in and out of bed or a chair without assistance. Very distracting. With effort, it can be ignored when deeply  involved in activities.   Moderately severe pain 4 Impossible to ignore for more than a few minutes. With effort, patients may still be able to manage work or participate in some social activities. Very difficult to concentrate. Signs of autonomic  nervous system discharge are evident: dilated pupils (mydriasis); mild sweating (diaphoresis); sleep interference. Heart rate becomes elevated (>115 bpm). Diastolic blood pressure (lower number) rises above 100 mmHg. Patients find relief in laying down and not moving.   Severe pain 5 Intense and extremely unpleasant. Associated with frowning face and frequent crying. Pain overwhelms the senses.  Ability to do any activity or maintain social relationships becomes significantly limited. Conversation becomes difficult. Pacing back and forth is common, as getting into a comfortable position is nearly impossible. Pain wakes you up from deep sleep. Physical signs will be obvious: pupillary dilation; increased sweating; goosebumps; brisk reflexes; cold, clammy hands and feet; nausea, vomiting or dry heaves; loss of appetite; significant sleep disturbance with inability to fall asleep or to remain asleep. When persistent, significant weight loss is observed due to the complete loss of appetite and sleep deprivation.  Blood pressure and heart rate becomes significantly elevated. Caution: If elevated blood pressure triggers a pounding headache associated with blurred vision, then the patient should immediately seek attention at an urgent or emergency care unit, as these may be signs of an impending stroke.    Emergency Department Pain Levels (6-10/10)  Emergency Room Pain 6 Severely limiting. Requires emergency care and should not be seen or managed at an outpatient pain management facility. Communication becomes difficult and requires great effort. Assistance to reach the emergency department may be required. Facial flushing and profuse sweating along with potentially  dangerous increases in heart rate and blood pressure will be evident.   Distressing pain 7 Self-care is very difficult. Assistance is required to transport, or use restroom. Assistance to reach the emergency department will be required. Tasks requiring coordination, such as bathing and getting dressed become very difficult.   Disabling pain 8 Self-care is no longer possible. At this level, pain is disabling. The individual is unable to do even the most "basic" activities such as walking, eating, bathing, dressing, transferring to a bed, or toileting. Fine motor skills are lost. It is difficult to think clearly.   Incapacitating pain 9 Pain becomes incapacitating. Thought processing is no longer possible. Difficult to remember your own name. Control of movement and coordination are lost.   The worst pain imaginable 10 At this level, most patients pass out from pain. When this level is reached, collapse of the autonomic nervous system occurs, leading to a sudden drop in blood pressure and heart rate. This in turn results in a temporary and dramatic drop in blood flow to the brain, leading to a loss of consciousness. Fainting is one of the body's self defense mechanisms. Passing out puts the brain in a calmed state and causes it to shut down for a while, in order to begin the healing process.    Summary: 1. Refer to this scale when providing [place] with your pain level. 2. Be accurate and careful when reporting your pain level. This will help with your care. 3. Over-reporting your pain level will lead to loss of credibility. 4. Even a level of 1/10 means that there is pain and will be treated at our facility. 5. High, inaccurate reporting will be documented as "Symptom Exaggeration", leading to loss of credibility and suspicions of possible secondary gains such as obtaining more narcotics, or wanting to appear disabled, for fraudulent reasons. 6. Only pain levels of 5 or below will be seen at our  facility. 7. Pain levels of 6 and above will be sent to the Emergency Department and the appointment cancelled. ____________________________________________________________________________________________  ____________________________________________________________________________________________  Preparing for your procedure (without sedation) Instructions: . Oral Intake: Do not eat or drink anything for at least 3 hours prior to your procedure. . Transportation: Unless otherwise stated by your physician, you may drive yourself after the procedure. . Blood Pressure Medicine: Take your blood pressure medicine with a sip of water the morning of the procedure. . Blood thinners:  . Diabetics on insulin: Notify the staff so that you can be scheduled 1st case in the morning. If your diabetes requires high dose insulin, take only  of your normal insulin dose the morning of the procedure and notify the staff that you have done so. . Preventing infections: Shower with an antibacterial soap the morning of your procedure.  . Build-up your immune system: Take 1000 mg of Vitamin C with every meal (3 times a day) the day prior to your procedure. Marland Kitchen Antibiotics: Inform the staff if you have a condition or reason that requires you to take antibiotics before dental procedures. . Pregnancy: If you are pregnant, call and cancel the procedure. . Sickness: If you have a cold, fever, or any active infections, call and cancel the procedure. . Arrival: You must be in the facility at least 30 minutes prior to your scheduled procedure. . Children: Do not bring any children with you. . Dress appropriately: Bring dark clothing that you would not mind if they get stained. . Valuables: Do not bring any jewelry or valuables. Procedure appointments are reserved for interventional treatments only. Marland Kitchen No Prescription Refills. . No medication changes will be discussed during procedure appointments. . No disability issues  will be discussed. ____________________________________________________________________________________________   Knee Injection A knee injection is a procedure to get medicine into your knee joint. Your health care provider puts a needle into the joint and injects medicine with an attached syringe. The injected medicine may relieve the pain, swelling, and stiffness of arthritis. The injected medicine may also help to lubricate and cushion your knee joint. You may need more than one injection. Tell a health care provider about:  Any allergies you have.  All medicines you are taking, including vitamins, herbs, eye drops, creams, and over-the-counter medicines.  Any problems you or family members have had with anesthetic medicines.  Any blood disorders you have.  Any surgeries you have had.  Any medical conditions you have. What are the risks? Generally, this is a safe procedure. However, problems may occur, including:  Infection.  Bleeding.  Worsening symptoms.  Damage to the area around your knee.  Allergic reaction to any of the medicines.  Skin reactions from repeated injections.  What happens before the procedure?  Ask your health care provider about changing or stopping your regular medicines. This is especially important if you are taking diabetes medicines or blood thinners.  Plan to have someone take you home after the procedure. What happens during the procedure?  You will sit or lie down in a position for your knee to be treated.  The skin over your kneecap will be cleaned with a germ-killing solution (antiseptic).  You will be given a medicine that numbs the area (local anesthetic). You may feel some stinging.  After your knee becomes numb, you will have a second injection. This is the medicine. This needle is carefully placed between your kneecap and your knee. The medicine is injected into the joint space.  At the end of the procedure, the needle will be  removed.  A bandage (dressing) may be placed over the injection  site. The procedure may vary among health care providers and hospitals. What happens after the procedure?  You may have to move your knee through its full range of motion. This helps to get all of the medicine into your joint space.  Your blood pressure, heart rate, breathing rate, and blood oxygen level will be monitored often until the medicines you were given have worn off.  You will be watched to make sure that you do not have a reaction to the injected medicine. This information is not intended to replace advice given to you by your health care provider. Make sure you discuss any questions you have with your health care provider. Document Released: 11/28/2006 Document Revised: 02/06/2016 Document Reviewed: 07/17/2014 Elsevier Interactive Patient Education  2018 [place]  What are the risk, side effects and possible complications? Generally speaking, most procedures are safe.  However, with any procedure there are risks, side effects, and the possibility of complications.  The risks and complications are dependent upon the sites that are lesioned, or the type of nerve block to be performed.  The closer the procedure is to the spine, the more serious the risks are.  Great care is taken when placing the radio frequency needles, block needles or lesioning probes, but sometimes complications can occur. 1. Infection: Any time there is an injection through the skin, there is a risk of infection.  This is why sterile conditions are used for these blocks.  There are four possible types of infection. 1. Localized skin infection. 2. Central Nervous System Infection-This can be in the form of Meningitis, which can be deadly. 3. Epidural Infections-This can be in the form of an epidural abscess, which can cause pressure inside of the spine, causing compression of the spinal cord with subsequent  paralysis. This would require an emergency surgery to decompress, and there are no guarantees that the patient would recover from the paralysis. 4. Discitis-This is an infection of the intervertebral discs.  It occurs in about 1% of discography procedures.  It is difficult to treat and it may lead to surgery.        2. Pain: the needles have to go through skin and soft tissues, will cause soreness.       3. Damage to internal structures:  The nerves to be lesioned may be near blood vessels or    other nerves which can be potentially damaged.       4. Bleeding: Bleeding is more common if the patient is taking blood thinners such as  aspirin, Coumadin, Ticiid, Plavix, etc., or if he/she have some genetic predisposition  such as hemophilia. Bleeding into the spinal canal can cause compression of the spinal  cord with subsequent paralysis.  This would require an emergency surgery to  decompress and there are no guarantees that the patient would recover from the  paralysis.       5. Pneumothorax:  Puncturing of a lung is a possibility, every time a needle is introduced in  the area of the chest or upper back.  Pneumothorax refers to free air around the  collapsed lung(s), inside of the thoracic cavity (chest cavity).  Another two possible  complications related to a similar event would include: Hemothorax and Chylothorax.   These are variations of the Pneumothorax, where instead of air around the collapsed  lung(s), you may have blood or chyle, respectively.       6. Spinal headaches: They may occur with any procedures  in the area of the spine.       7. Persistent CSF (Cerebro-Spinal Fluid) leakage: This is a rare problem, but may occur  with prolonged intrathecal or epidural catheters either due to the formation of a fistulous  track or a dural tear.       8. Nerve damage: By working so close to the spinal cord, there is always a possibility of  nerve damage, which could be as serious as a permanent spinal  cord injury with  paralysis.       9. Death:  Although rare, severe deadly allergic reactions known as "Anaphylactic  reaction" can occur to any of the medications used.      10. Worsening of the symptoms:  We can always make thing worse.  What are the chances of something like this happening? Chances of any of this occuring are extremely low.  By statistics, you have more of a chance of getting killed in a motor vehicle accident: while driving to the hospital than any of the above occurring .  Nevertheless, you should be aware that they are possibilities.  In general, it is similar to taking a shower.  Everybody knows that you can slip, hit your head and get killed.  Does that mean that you should not shower again?  Nevertheless always keep in mind that statistics do not mean anything if you happen to be on the wrong side of them.  Even if a procedure has a 1 (one) in a 1,000,000 (million) chance of going wrong, it you happen to be that one..Also, keep in mind that by statistics, you have more of a chance of having something go wrong when taking medications.  Who should not have this procedure? If you are on a blood thinning medication (e.g. Coumadin, Plavix, see list of "Blood Thinners"), or if you have an active infection going on, you should not have the procedure.  If you are taking any blood thinners, please inform your physician.  How should I prepare for this procedure?  Do not eat or drink anything at least six hours prior to the procedure.  Bring a driver with you .  It cannot be a taxi.  Come accompanied by an adult that can drive you back, and that is strong enough to help you if your legs get weak or numb from the local anesthetic.  Take all of your medicines the morning of the procedure with just enough water to swallow them.  If you have diabetes, make sure that you are scheduled to have your procedure done first thing in the morning, whenever possible.  If you have diabetes,  take only half of your insulin dose and notify our nurse that you have done so as soon as you arrive at the clinic.  If you are diabetic, but only take blood sugar pills (oral hypoglycemic), then do not take them on the morning of your procedure.  You may take them after you have had the procedure.  Do not take aspirin or any aspirin-containing medications, at least eleven (11) days prior to the procedure.  They may prolong bleeding.  Wear loose fitting clothing that may be easy to take off and that you would not mind if it got stained with Betadine or blood.  Do not wear any jewelry or perfume  Remove any nail coloring.  It will interfere with some of our monitoring equipment.  NOTE: Remember that this is not meant to be interpreted as a complete list  of all possible complications.  Unforeseen problems may occur.  BLOOD THINNERS The following drugs contain aspirin or other products, which can cause increased bleeding during surgery and should not be taken for 2 weeks prior to and 1 week after surgery.  If you should need take something for relief of minor pain, you may take acetaminophen which is found in Tylenol,m Datril, Anacin-3 and Panadol. It is not blood thinner. The products listed below are.  Do not take any of the products listed below in addition to any listed on your instruction sheet.  A.P.C or A.P.C with Codeine Codeine Phosphate Capsules #3 Ibuprofen Ridaura  ABC compound Congesprin Imuran rimadil  Advil Cope Indocin Robaxisal  Alka-Seltzer Effervescent Pain Reliever and Antacid Coricidin or Coricidin-D  Indomethacin Rufen  Alka-Seltzer plus Cold Medicine Cosprin Ketoprofen S-A-C Tablets  Anacin Analgesic Tablets or Capsules Coumadin Korlgesic Salflex  Anacin Extra Strength Analgesic tablets or capsules CP-2 Tablets Lanoril Salicylate  Anaprox Cuprimine Capsules Levenox Salocol  Anexsia-D Dalteparin Magan Salsalate  Anodynos Darvon compound Magnesium Salicylate Sine-off   Ansaid Dasin Capsules Magsal Sodium Salicylate  Anturane Depen Capsules Marnal Soma  APF Arthritis pain formula Dewitt's Pills Measurin Stanback  Argesic Dia-Gesic Meclofenamic Sulfinpyrazone  Arthritis Bayer Timed Release Aspirin Diclofenac Meclomen Sulindac  Arthritis pain formula Anacin Dicumarol Medipren Supac  Analgesic (Safety coated) Arthralgen Diffunasal Mefanamic Suprofen  Arthritis Strength Bufferin Dihydrocodeine Mepro Compound Suprol  Arthropan liquid Dopirydamole Methcarbomol with Aspirin Synalgos  ASA tablets/Enseals Disalcid Micrainin Tagament  Ascriptin Doan's Midol Talwin  Ascriptin A/D Dolene Mobidin Tanderil  Ascriptin Extra Strength Dolobid Moblgesic Ticlid  Ascriptin with Codeine Doloprin or Doloprin with Codeine Momentum Tolectin  Asperbuf Duoprin Mono-gesic Trendar  Aspergum Duradyne Motrin or Motrin IB Triminicin  Aspirin plain, buffered or enteric coated Durasal Myochrisine Trigesic  Aspirin Suppositories Easprin Nalfon Trillsate  Aspirin with Codeine Ecotrin Regular or Extra Strength Naprosyn Uracel  Atromid-S Efficin Naproxen Ursinus  Auranofin Capsules Elmiron Neocylate Vanquish  Axotal Emagrin Norgesic Verin  Azathioprine Empirin or Empirin with Codeine Normiflo Vitamin E  Azolid Emprazil Nuprin Voltaren  Bayer Aspirin plain, buffered or children's or timed BC Tablets or powders Encaprin Orgaran Warfarin Sodium  Buff-a-Comp Enoxaparin Orudis Zorpin  Buff-a-Comp with Codeine Equegesic Os-Cal-Gesic   Buffaprin Excedrin plain, buffered or Extra Strength Oxalid   Bufferin Arthritis Strength Feldene Oxphenbutazone   Bufferin plain or Extra Strength Feldene Capsules Oxycodone with Aspirin   Bufferin with Codeine Fenoprofen Fenoprofen Pabalate or Pabalate-SF   Buffets II Flogesic Panagesic   Buffinol plain or Extra Strength Florinal or Florinal with Codeine Panwarfarin   Buf-Tabs Flurbiprofen Penicillamine   Butalbital Compound Four-way cold tablets  Penicillin   Butazolidin Fragmin Pepto-Bismol   Carbenicillin Geminisyn Percodan   Carna Arthritis Reliever Geopen Persantine   Carprofen Gold's salt Persistin   Chloramphenicol Goody's Phenylbutazone   Chloromycetin Haltrain Piroxlcam   Clmetidine heparin Plaquenil   Cllnoril Hyco-pap Ponstel   Clofibrate Hydroxy chloroquine Propoxyphen         Before stopping any of these medications, be sure to consult the physician who ordered them.  Some, such as Coumadin (Warfarin) are ordered to prevent or treat serious conditions such as "deep thrombosis", "pumonary embolisms", and other heart problems.  The amount of time that you may need off of the medication may also vary with the medication and the reason for which you were taking it.  If you are taking any of these medications, please make sure you notify your pain physician before you undergo any  procedures.

## 2017-03-07 ENCOUNTER — Ambulatory Visit: Payer: Medicare (Managed Care) | Admitting: Pain Medicine

## 2017-03-07 ENCOUNTER — Ambulatory Visit: Payer: Medicare (Managed Care) | Attending: Pain Medicine | Admitting: Pain Medicine

## 2017-03-07 ENCOUNTER — Encounter: Payer: Self-pay | Admitting: Pain Medicine

## 2017-03-07 VITALS — BP 127/72 | HR 112 | Temp 98.7°F | Resp 16 | Ht 69.0 in | Wt 280.0 lb

## 2017-03-07 DIAGNOSIS — G8929 Other chronic pain: Secondary | ICD-10-CM | POA: Insufficient documentation

## 2017-03-07 DIAGNOSIS — M25561 Pain in right knee: Secondary | ICD-10-CM

## 2017-03-07 DIAGNOSIS — M1712 Unilateral primary osteoarthritis, left knee: Secondary | ICD-10-CM

## 2017-03-07 DIAGNOSIS — M170 Bilateral primary osteoarthritis of knee: Secondary | ICD-10-CM | POA: Insufficient documentation

## 2017-03-07 DIAGNOSIS — M542 Cervicalgia: Secondary | ICD-10-CM | POA: Diagnosis not present

## 2017-03-07 DIAGNOSIS — M545 Low back pain: Secondary | ICD-10-CM | POA: Diagnosis not present

## 2017-03-07 DIAGNOSIS — M25562 Pain in left knee: Secondary | ICD-10-CM

## 2017-03-07 DIAGNOSIS — M1711 Unilateral primary osteoarthritis, right knee: Secondary | ICD-10-CM | POA: Insufficient documentation

## 2017-03-07 MED ORDER — ROPIVACAINE HCL 2 MG/ML IJ SOLN
INTRAMUSCULAR | Status: AC
  Filled 2017-03-07: qty 20

## 2017-03-07 MED ORDER — METHYLPREDNISOLONE ACETATE 40 MG/ML IJ SUSP: 40.0000 mg | Freq: Once | INTRAMUSCULAR | Status: DC

## 2017-03-07 MED ORDER — LIDOCAINE HCL (PF) 1 % IJ SOLN
5.0000 mL | Freq: Once | INTRAMUSCULAR | Status: AC
  Administered 2017-03-07: 5 mL

## 2017-03-07 MED ORDER — LIDOCAINE HCL (PF) 1 % IJ SOLN
INTRAMUSCULAR | Status: AC
  Filled 2017-03-07: qty 10

## 2017-03-07 MED ORDER — METHYLPREDNISOLONE ACETATE 80 MG/ML IJ SUSP
INTRAMUSCULAR | Status: AC
  Filled 2017-03-07: qty 2

## 2017-03-07 MED ORDER — ROPIVACAINE HCL 2 MG/ML IJ SOLN
2.0000 mL | Freq: Once | INTRAMUSCULAR | Status: AC
  Administered 2017-03-07: 5 mL via INTRA_ARTICULAR

## 2017-03-07 MED ORDER — METHYLPREDNISOLONE ACETATE 40 MG/ML IJ SUSP
INTRAMUSCULAR | Status: AC
  Filled 2017-03-07: qty 2

## 2017-03-07 MED ORDER — METHYLPREDNISOLONE ACETATE 80 MG/ML IJ SUSP
80.0000 mg | Freq: Once | INTRAMUSCULAR | Status: AC
  Administered 2017-03-07: 80 mg via INTRA_ARTICULAR

## 2017-03-07 NOTE — Progress Notes (Signed)
Patient's Name: James Friedman  MRN: [id number]  Referring Provider: Bobbye Morton, MD  DOB: 05-Sep-1946  PCP: Bobbye Morton, MD  DOS: 03/07/2017  Note by: Sydnee Levans. Laban Emperor, MD  Service setting: Ambulatory outpatient  Location: [facility] (AMB) Pain Management Facility  Visit type: Procedure  Specialty: Interventional Pain Management  Patient type: Established   Primary Reason for Visit: Interventional Pain Management Treatment. CC: Knee Pain (bilateral); Back Pain (lower); and Neck Pain  Procedure:  Anesthesia, Analgesia, Anxiolysis:  Type: Diagnostic Intra-Articular Local anesthetic and steroid Knee Injection Region: Lateral infrapatellar Knee Region Level: Knee Joint Laterality: Bilateral  Type: Local Anesthesia Local Anesthetic: Lidocaine 1% Route: Infiltration (Mora/IM) IV Access: Declined Sedation: Declined  Indication(s): Analgesia          Indications: 1. Osteoarthritis of knee (Bilateral)   2. Chronic knee pain (Bilateral)   3. Primary osteoarthritis of left knee   4. Primary osteoarthritis of right knee    Pain Score: Pre-procedure: 0-No pain/10 Post-procedure: 0-No pain/10  Pre-op Assessment:  Previous date of service: 03/03/17 Service provided:   James Friedman is a 71 y.o. (year old), male patient, seen today for interventional treatment. He  has a past surgical history that includes Knee surgery (Bilateral); Neck surgery; Carpal tunnel release; Shoulder surgery (Right); Cataract extraction, extracapsular (Left, 08/04/2015); and Cataract extraction w/PHACO (Right, 04/19/2016). His primarily concern today is the Knee Pain (bilateral); Back Pain (lower); and Neck Pain  Initial Vital Signs: There were no vitals taken for this visit. BMI: 41.35 kg/m  Risk Assessment: Allergies: Reviewed. He has No Known Allergies.  Allergy Precautions: None required Coagulopathies: Reviewed. None identified.  Blood-thinner therapy: None at this time Active Infection(s): Reviewed. None  identified. James Friedman is afebrile  Site Confirmation: Mr. Hernan was asked to confirm the procedure and laterality before marking the site Procedure checklist: Completed Consent: Before the procedure and under the influence of no sedative(s), amnesic(s), or anxiolytics, the patient was informed of the treatment options, risks and possible complications. To fulfill our ethical and legal obligations, as recommended by the American Medical Association's Code of Ethics, I have informed the patient of my clinical impression; the nature and purpose of the treatment or procedure; the risks, benefits, and possible complications of the intervention; the alternatives, including doing nothing; the risk(s) and benefit(s) of the alternative treatment(s) or procedure(s); and the risk(s) and benefit(s) of doing nothing. The patient was provided information about the general risks and possible complications associated with the procedure. These may include, but are not limited to: failure to achieve desired goals, infection, bleeding, organ or nerve damage, allergic reactions, paralysis, and death. In addition, the patient was informed of those risks and complications associated to the procedure, such as failure to decrease pain; infection; bleeding; organ or nerve damage with subsequent damage to sensory, motor, and/or autonomic systems, resulting in permanent pain, numbness, and/or weakness of one or several areas of the body; allergic reactions; (i.e.: anaphylactic reaction); and/or death. Furthermore, the patient was informed of those risks and complications associated with the medications. These include, but are not limited to: allergic reactions (i.e.: anaphylactic or anaphylactoid reaction(s)); adrenal axis suppression; blood sugar elevation that in diabetics may result in ketoacidosis or comma; water retention that in patients with history of congestive heart failure may result in shortness of breath, pulmonary  edema, and decompensation with resultant heart failure; weight gain; swelling or edema; medication-induced neural toxicity; particulate matter embolism and blood vessel occlusion with resultant organ, and/or nervous system infarction;  and/or aseptic necrosis of one or more joints. Finally, the patient was informed that Medicine is not an exact science; therefore, there is also the possibility of unforeseen or unpredictable risks and/or possible complications that may result in a catastrophic outcome. The patient indicated having understood very clearly. We have given the patient no guarantees and we have made no promises. Enough time was given to the patient to ask questions, all of which were answered to the patient's satisfaction. [name] has indicated that he wanted to continue with the procedure. Attestation: I, the ordering provider, attest that I have discussed with the patient the benefits, risks, side-effects, alternatives, likelihood of achieving goals, and potential problems during recovery for the procedure that I have provided informed consent. Date: 03/07/2017; Time: 1:43 PM  Pre-Procedure Preparation:  Monitoring: As per clinic protocol. Respiration, ETCO2, SpO2, BP, heart rate and rhythm monitor placed and checked for adequate function Safety Precautions: Patient was assessed for positional comfort and pressure points before starting the procedure. Time-out: I initiated and conducted the "Time-out" before starting the procedure, as per protocol. The patient was asked to participate by confirming the accuracy of the "Time Out" information. Verification of the correct person, site, and procedure were performed and confirmed by me, the nursing staff, and the patient. "Time-out" conducted as per Joint Commission's Universal Protocol (UP.01.01.01). "Time-out" Date & Time: 03/07/2017; 1412 hrs.  Description of Procedure Process:   Position: Sitting Target Area: Knee Joint Approach: Just  above the Lateral tibial plateau, lateral to the infrapatellar tendon. Area Prepped: Entire knee area, from the mid-thigh to the mid-shin. Prepping solution: ChloraPrep (2% chlorhexidine gluconate and 70% isopropyl alcohol) Safety Precautions: Aspiration looking for blood return was conducted prior to all injections. At no point did we inject any substances, as a needle was being advanced. No attempts were made at seeking any paresthesias. Safe injection practices and needle disposal techniques used. Medications properly checked for expiration dates. SDV (single dose vial) medications used. Description of the Procedure: Protocol guidelines were followed. The patient was placed in position over the fluoroscopy table. The target area was identified and the area prepped in the usual manner. Skin desensitized using vapocoolant spray. Skin & deeper tissues infiltrated with local anesthetic. Appropriate amount of time allowed to pass for local anesthetics to take effect. The procedure needles were then advanced to the target area. Proper needle placement secured. Negative aspiration confirmed. Solution injected in intermittent fashion, asking for systemic symptoms every 0.5cc of injectate. The needles were then removed and the area cleansed, making sure to leave some of the prepping solution back to take advantage of its long term bactericidal properties. Vitals:   03/07/17 1347 03/07/17 1418  BP: (!) 146/93 127/72  Pulse: (!) 119 (!) 112  Resp: 16 16  Temp: 98.7 F (37.1 C)   SpO2: 100%   Weight: 280 lb (127 kg)   Height: 5\' 9"  (1.753 m)     Start Time: 1418 hrs. End Time: 1416 hrs. Materials:  Needle(s) Type: Regular needle Gauge: 25G Length: 1.5-in Medication(s): We administered lidocaine (PF), ropivacaine (PF) 2 mg/mL (0.2%), lidocaine (PF), and ropivacaine (PF) 2 mg/mL (0.2%). Please see chart orders for dosing details.  Imaging Guidance:  Type of Imaging Technique: None used Indication(s):  N/A Exposure Time: No patient exposure Contrast: None used. Fluoroscopic Guidance: N/A Ultrasound Guidance: N/A Interpretation: N/A  Antibiotic Prophylaxis:  Indication(s): None identified Antibiotic given: None  Post-operative Assessment:  EBL: None Complications: No immediate post-treatment complications observed by team,  or reported by patient. Note: The patient tolerated the entire procedure well. A repeat set of vitals were taken after the procedure and the patient was kept under observation following institutional policy, for this type of procedure. Post-procedural neurological assessment was performed, showing return to baseline, prior to discharge. The patient was provided with post-procedure discharge instructions, including a section on how to identify potential problems. Should any problems arise concerning this procedure, the patient was given instructions to immediately contact us, at any time, without hesitation. In any case, we plan to contact the patient by telephone for a follow-up status report regarding this interventional procedure. Comments:  No additional relevant information.  Plan of Care  Disposition: Discharge home  Discharge Date & Time: 03/07/2017; 1421 hrs.  Physician-requested Follow-up:  No Follow-up on file.  Future Appointments Date Time Provider Department Center  03/22/2017 10:00 AM Delano Metz, MD [facility] None  06/02/2017 9:30 AM Barbette Merino, NP [facility] None   Medications ordered for procedure: Meds ordered this encounter  Medications  . lidocaine (PF) (XYLOCAINE) 1 % injection 5 mL  . ropivacaine (PF) 2 mg/mL (0.2%) (NAROPIN) injection 2 mL  . lidocaine (PF) (XYLOCAINE) 1 % injection 5 mL  . ropivacaine (PF) 2 mg/mL (0.2%) (NAROPIN) injection 2 mL  . methylPREDNISolone acetate (DEPO-MEDROL) injection 80 mg   Medications administered: We administered lidocaine (PF), ropivacaine (PF) 2 mg/mL (0.2%), lidocaine (PF), and ropivacaine  (PF) 2 mg/mL (0.2%).  See the medical record for exact dosing, route, and time of administration.  Lab-work, Procedure(s), & Referral(s) Ordered: Orders Placed This Encounter  Procedures  . KNEE INJECTION  . Informed Consent Details: Transcribe to consent form and obtain patient signature  . Provider attestation of informed consent for procedure/surgical case  . Verify informed consent  . Discharge instructions  . Follow-up   Imaging Ordered: Results for orders placed in visit on 02/16/17  DG C-Arm 1-60 Min-No Report   Narrative Fluoroscopy was utilized by the requesting physician.  No radiographic  interpretation.    New Prescriptions   No medications on file   Primary Care Physician: Bobbye Morton, MD Location: [facility] Note by: Sydnee Levans. Laban Emperor, M.D, DABA, DABAPM, DABPM, DABIPP, FIPP Date: 03/07/2017; Time: 2:53 PM  Disclaimer:  Medicine is not an Visual merchandiser. The only guarantee in medicine is that nothing is guaranteed. It is important to note that the decision to proceed with this intervention was based on the information collected from the patient. The Data and conclusions were drawn from the patient's questionnaire, the interview, and the physical examination. Because the information was provided in large part by the patient, it cannot be guaranteed that it has not been purposely or unconsciously manipulated. Every effort has been made to obtain as much relevant data as possible for this evaluation. It is important to note that the conclusions that lead to this procedure are derived in large part from the available data. Always take into account that the treatment will also be dependent on availability of resources and existing treatment guidelines, considered by other Pain Management Practitioners as being common knowledge and practice, at the time of the intervention. For Medico-Legal purposes, it is also important to point out that  variation in procedural techniques and pharmacological choices are the acceptable norm. The indications, contraindications, technique, and results of the above procedure should only be interpreted and judged by a Board-Certified Interventional Pain Specialist with extensive familiarity and expertise in the same exact procedure and technique.  Instructions provided at this appointment: There are no Patient Instructions on file for this visit.

## 2017-03-07 NOTE — Progress Notes (Signed)
Safety precautions to be maintained throughout the outpatient stay will include: orient to surroundings, keep bed in low position, maintain call bell within reach at all times, provide assistance with transfer out of bed and ambulation.  

## 2017-03-08 ENCOUNTER — Telehealth: Payer: Self-pay | Admitting: *Deleted

## 2017-03-08 NOTE — Telephone Encounter (Signed)
Denies complications post procedure. 

## 2017-03-09 LAB — TOXASSURE SELECT 13 (MW), URINE

## 2017-03-21 NOTE — Progress Notes (Signed)
Patient's Name: James Friedman  MRN: [id number]  Referring Provider: Gareth Morgan, MD  DOB: Oct 22, 1945  PCP: Gareth Morgan, MD  DOS: 03/22/2017  Note by: Kathlen Brunswick. Dossie Arbour, MD  Service setting: Ambulatory outpatient  Specialty: Interventional Pain Management  Location: [facility] (AMB) Pain Management Facility    Patient type: Established   Primary Reason(s) for Visit: Encounter for post-procedure evaluation of chronic illness with mild to moderate exacerbation CC: Knee Pain (right)  HPI  James Friedman is a 71 y.o. year old, male patient, who comes today for a post-procedure evaluation. He has Hyperlipidemia; Chronic shoulder pain (Bilateral) (L>R); Non-traumatic rotator cuff tear; Chronic pain syndrome; Chronic low back pain (Location of Primary Source of Pain) (Bilateral) (L>R); Lumbosacral radiculopathy at S1 (Right side); Lumbar facet syndrome (Bilateral) (L>R); Chronic neck pain (Bilateral) (R>L); Chronic cervical radicular pain (Bilateral) (R>L); Cervical spondylosis; Morbid obesity ([place]); Long term current use of opiate analgesic; Long term prescription opiate use; Opiate use (60 MME/Day); Opiate dependence ([place]); Chronic sacroiliac joint pain (Bilateral) (R>L); Failed neck surgery syndrome; Chronic upper extremity pain (Bilateral) (R>L); History of bilateral knee surgery; Diffuse myofascial pain syndrome; Lumbar facet hypertrophy (L4-5 and L5-S1); Carpal tunnel syndrome on right (Released); Transient weakness of left lower extremity; At high risk for falls; Lumbar spinal stenosis (L3-4); 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); Grade 1 Anterolisthesis of L4 over L5; Lumbar disc herniation (Left L4-5) (Right L5-S1); Chronic lower extremity pain (Location of Secondary source of pain) (Bilateral) (R>L); Essential hypertension; Diabetes mellitus without complication ([place]); Atypical chest pain; Chronic knee pain (Bilateral); Diabetic peripheral neuropathy ([place]); Chronic lumbar radicular pain  (Location of Secondary source of pain) (Bilateral) (R>L) (S1 Dermatome on the left); Abnormal EMG/PNCV (11/26/2015); Postoperative pain (post-radiofrequency); Lumbar spondylosis; COPD 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 ([place]); Polyneuropathy; Osteoarthritis of knee (Bilateral); Osteoarthritis of shoulder (Bilateral); Chronic shoulder Arthropathy (Bilateral); Cervical myofascial pain syndrome (Left trapezius); Primary osteoarthritis of left knee; and Primary osteoarthritis of right knee on his problem list. His primarily concern today is the Knee Pain (right)  Pain Assessment: Location: Right Knee Radiating: right hip and right upper leg Onset: More than a month ago Duration: Chronic pain Quality: Throbbing (ouch) Severity: 8 /10 (self-reported pain score)  Note: Reported level is compatible with observation.                   Effect on ADL:   Timing: Constant Modifying factors: nothing  James Friedman comes in today for post-procedure evaluation after the treatment done on 03/07/2017.  Further details on both, my assessment(s), as well as the proposed treatment plan, please see below.  Post-Procedure Assessment  03/07/2017 Procedure: Diagnostic bilateral intra-articular knee joint injection with local anesthetic and steroid Pre-procedure pain score:  0/10 Post-procedure pain score: 0/10 (100% relief) Influential Factors: BMI: 41.79 kg/m Intra-procedural challenges: None observed Assessment challenges: None detected         Post-procedural adverse reactions or complications: None reported Reported side-effects: None  Sedation: No sedation used. When no sedatives are used, the analgesic levels obtained are directly associated to the effectiveness of the local anesthetics. However, when sedation is provided, the level of analgesia obtained during the initial 1 hour following the intervention, is believed to be the result of a combination of factors. These factors may include, but are not limited to: 1. The  effectiveness of the local anesthetics used. 2. The effects of the analgesic(s) and/or anxiolytic(s) used. 3. The degree of discomfort experienced by the patient at the time of the procedure. 4.  The patients ability and reliability in recalling and recording the events. 5. The presence and influence of possible secondary gains and/or psychosocial factors. Reported result: Relief experienced during the 1st hour after the procedure: 100 % (Ultra-Short Term Relief) Interpretative annotation: Analgesia during this period is likely to be Local Anesthetic and/or IV Sedative (Analgesic/Anxiolitic) related.          Effects of local anesthetic: The analgesic effects attained during this period are directly associated to the localized infiltration of local anesthetics and therefore cary significant diagnostic value as to the etiological location, or anatomical origin, of the pain. Expected duration of relief is directly dependent on the pharmacodynamics of the local anesthetic used. Long-acting (4-6 hours) anesthetics used.  Reported result: Relief during the next 4 to 6 hour after the procedure: 100 % (Short-Term Relief) Interpretative annotation: Complete relief would suggest area to be the source of the pain.          Long-term benefit: Defined as the period of time past the expected duration of local anesthetics. With the possible exception of prolonged sympathetic blockade from the local anesthetics, benefits during this period are typically attributed to, or associated with, other factors such as analgesic sensory neuropraxia, antiinflammatory effects, or beneficial biochemical changes provided by agents other than the local anesthetics Reported result: Extended relief following procedure: 0 % (Long-Term Relief) Interpretative annotation: No benefit. This could suggest algesic mechanism to be mechanical rather than inflammatory.          Current benefits: Defined as persistent relief that continues at  this point in time.   Reported results: Treated area: 0 %       Interpretative annotation: Recurrance of symptoms. This would suggest persistent aggravating factors  Interpretation: Results would suggest a successful diagnostic intervention.          Laboratory Chemistry  Inflammation Markers (CRP: Acute Phase) (ESR: Chronic Phase) Lab Results  Component Value Date   ESRSEDRATE 7 10/11/2013                 Renal Function Markers Lab Results  Component Value Date   BUN 16 10/11/2013   CREATININE 1.13 10/11/2013   GFRAA >60 10/11/2013   GFRNONAA >60 10/11/2013                 Hepatic Function Markers Lab Results  Component Value Date   AST 20 10/11/2013   ALT 28 10/11/2013   ALBUMIN 3.8 10/11/2013   ALKPHOS 67 10/11/2013                 Electrolytes Lab Results  Component Value Date   NA 135 (L) 10/11/2013   K 4.6 10/11/2013   CL 98 10/11/2013   CALCIUM 8.5 10/11/2013   MG 1.8 05/24/2014                 Neuropathy Markers No results found for: [id number]               Bone Pathology Markers Lab Results  Component Value Date   ALKPHOS 67 10/11/2013   CALCIUM 8.5 10/11/2013                 Coagulation Parameters No results found for: INR, LABPROT, APTT, PLT               Cardiovascular Markers No results found for: BNP, HGB, HCT               Note: Lab results reviewed.  Recent  Diagnostic Imaging Review  Dg C-arm 1-60 Min-no Report  Result Date: 02/16/2017 Fluoroscopy was utilized by the requesting physician.  No radiographic interpretation.   Note: Imaging results reviewed.          Meds   Current Meds  Medication Sig  . acetaminophen (TYLENOL) 325 MG tablet Take 650 mg by mouth as needed.   Marland Kitchen albuterol (PROAIR HFA) 108 (90 BASE) MCG/ACT inhaler Inhale 2 puffs into the lungs every 6 (six) hours as needed for wheezing.  Marland Kitchen aluminum hydroxide (AMPHOJEL/ALTERNAGEL) 320 MG/5ML suspension Take 30 mLs by mouth every 6 (six) hours as needed for  indigestion or heartburn.  Marland Kitchen amLODipine (NORVASC) 5 MG tablet Take 5 mg by mouth daily.   Marland Kitchen antiseptic oral rinse (BIOTENE) LIQD 15 mLs by Mouth Rinse route as needed for dry mouth.  Marland Kitchen aspirin 81 MG chewable tablet Chew by mouth.  Marland Kitchen atorvastatin (LIPITOR) 80 MG tablet Take 80 mg by mouth daily.  Marland Kitchen azelastine (ASTELIN) 0.1 % nasal spray Place 1 spray into both nostrils as needed. Use in each nostril as directed   . capsaicin (ZOSTRIX) 0.025 % cream Apply 1 application topically 2 (two) times daily.  . Carboxymethylcellul-Glycerin (OPTIVE) 0.5-0.9 % SOLN Apply 1 drop to eye daily.  . cetirizine (ZYRTEC) 10 MG tablet Take 10 mg by mouth daily.  . chlorpheniramine (CHLOR-TRIMETON) 4 MG tablet Take 4 mg by mouth every 6 (six) hours as needed for allergies.  . Clotrimazole 1 % OINT Apply 1 application topically 2 (two) times daily.   . diclofenac sodium (VOLTAREN) 1 % GEL Apply topically 2 (two) times daily as needed.  . docusate sodium (COLACE) 100 MG capsule Take 100 mg by mouth daily as needed for mild constipation.  . DULoxetine (CYMBALTA) 30 MG capsule Take 30 mg by mouth 2 (two) times daily.  . ergocalciferol (VITAMIN D2) 50000 units capsule Take 50,000 Units by mouth every 30 (thirty) days.  . fluticasone (FLONASE) 50 MCG/ACT nasal spray   . gabapentin (NEURONTIN) 600 MG tablet Take 600 mg by mouth 2 (two) times daily. One capsule in the am and two capsules at night  . glucagon (GLUCAGON EMERGENCY) 1 MG injection Inject 1 mg into the vein once as needed.  . hydrOXYzine (ATARAX/VISTARIL) 25 MG tablet Take 25 mg by mouth every 6 (six) hours as needed for itching.  . insulin aspart (NOVOLOG) 100 UNIT/ML injection Inject 70 Units into the skin 4 (four) times daily -  before meals and at bedtime.   . insulin detemir (LEVEMIR) 100 UNIT/ML injection Inject 80 Units into the skin 2 (two) times daily.   Marland Kitchen lidocaine (LIDODERM) 5 % Place 1 patch onto the skin daily. Remove & Discard patch within 12 hours  or as directed by MD  . lisinopril (PRINIVIL,ZESTRIL) 10 MG tablet Take 10 mg by mouth daily.  . Magnesium 400 MG TABS Take 1 tablet by mouth 2 (two) times daily.   . metFORMIN (GLUCOPHAGE) 1000 MG tablet Take 1,000 mg by mouth 2 (two) times daily with a meal.  . methocarbamol (ROBAXIN) 500 MG tablet Take 500 mg by mouth 2 (two) times daily.   . metoprolol (LOPRESSOR) 100 MG tablet Take 100 mg by mouth 2 (two) times daily.  . mometasone (NASONEX) 50 MCG/ACT nasal spray Place 2 sprays into the nose daily.  Marland Kitchen morphine (MS CONTIN) 15 MG 12 hr tablet Take 1 tablet (15 mg total) by mouth every 12 (twelve) hours.  Derrill Memo ON 04/08/2017] morphine (MS  CONTIN) 15 MG 12 hr tablet Take 1 tablet (15 mg total) by mouth every 12 (twelve) hours.  Derrill Memo ON 05/08/2017] morphine (MS CONTIN) 15 MG 12 hr tablet Take 1 tablet (15 mg total) by mouth every 12 (twelve) hours.  . Multiple Vitamin (MULTIVITAMIN) tablet Take 1 tablet by mouth daily.  . naloxone (NARCAN) nasal spray 4 mg/0.1 mL Place 1 spray into the nose.  . naproxen (NAPROSYN) 500 MG tablet Take 500 mg by mouth 2 (two) times daily with a meal.  . Omega-3 Fatty Acids (FISH OIL) 1000 MG CAPS Take by mouth 2 (two) times daily.  Marland Kitchen omeprazole (PRILOSEC) 20 MG capsule Take 20 mg by mouth daily.  Marland Kitchen oxyCODONE (OXY IR/ROXICODONE) 5 MG immediate release tablet Take 1 tablet (5 mg total) by mouth every 6 (six) hours as needed for severe pain.  Derrill Memo ON 04/08/2017] oxyCODONE (OXY IR/ROXICODONE) 5 MG immediate release tablet Take 1 tablet (5 mg total) by mouth every 4 (four) hours as needed for severe pain.  Derrill Memo ON 05/08/2017] oxyCODONE (OXY IR/ROXICODONE) 5 MG immediate release tablet Take 1 tablet (5 mg total) by mouth every 4 (four) hours as needed for severe pain.  . OXYGEN Inhale 3 L into the lungs at bedtime.  . pantoprazole (PROTONIX) 40 MG tablet Take 40 mg by mouth.  . polyethylene glycol powder (GLYCOLAX/MIRALAX) powder Take by mouth as needed.   .  promethazine (PHENERGAN) 12.5 MG tablet Take by mouth.  . senna (SENOKOT) 8.6 MG TABS tablet Take 1 tablet by mouth daily as needed for mild constipation.  . sitaGLIPtin-metformin (JANUMET) 50-1000 MG tablet Take 1 tablet by mouth 2 (two) times daily with a meal.  . testosterone cypionate (DEPOTESTOTERONE CYPIONATE) 200 MG/ML injection Inject 200 mg into the muscle every 14 (fourteen) days.  . traZODone (DESYREL) 150 MG tablet 150 mg daily.   . vitamin C (ASCORBIC ACID) 500 MG tablet Take 500 mg by mouth daily.  . Vitamins A & D (VITAMIN A & D) ointment Apply 1 application topically as needed for dry skin.    ROS  Constitutional: Denies any fever or chills Gastrointestinal: No reported hemesis, hematochezia, vomiting, or acute GI distress Musculoskeletal: Denies any acute onset joint swelling, redness, loss of ROM, or weakness Neurological: No reported episodes of acute onset apraxia, aphasia, dysarthria, agnosia, amnesia, paralysis, loss of coordination, or loss of consciousness  Allergies  Mr. Bueche has No Known Allergies.  PFSH  Drug: Mr. Candy  reports that he does not use drugs. Alcohol:  reports that he does not drink alcohol. Tobacco:  reports that he has quit smoking. His smoking use included Cigarettes. He has a 3.75 pack-year smoking history. His smokeless tobacco use includes Chew. Medical:  has a past medical history of Anxiety; Asthma; Atypical chest pain; Back pain; BPH (benign prostatic hypertrophy); Carpal tunnel syndrome; Carpal tunnel syndrome on right (07/17/2015); Chest pain (07/26/2013); DDD (degenerative disc disease); Diabetes mellitus without complication ([place]); Essential hypertension; Falls; GERD (gastroesophageal reflux disease); Heme positive stool; History of bilateral knee surgery (07/17/2015); Hyperlipidemia; Hypertension; Morbid obesity ([place]); OA (osteoarthritis); OSA (obstructive sleep apnea); Ventral hernia; and Wears dentures. Surgical: Mr. Seeman  has a  past surgical history that includes Knee surgery (Bilateral); Neck surgery; Carpal tunnel release; Shoulder surgery (Right); Cataract extraction, extracapsular (Left, 08/04/2015); and Cataract extraction w/PHACO (Right, 04/19/2016). Family: family history includes Heart attack in his father.  Constitutional Exam  General appearance: Well nourished, well developed, and well hydrated. In no apparent acute  distress Vitals:   03/22/17 0859  BP: 133/84  Pulse: 90  Resp: 18  Temp: 97.7 F (36.5 C)  TempSrc: Oral  SpO2: 99%  Weight: 283 lb (128.4 kg)  Height: _0  (1.753 m)   BMI Assessment: Estimated body mass index is 41.79 kg/m as calculated from the following:   Height as of this encounter: _1  (1.753 m).   Weight as of this encounter: 283 lb (128.4 kg).  BMI interpretation table: BMI level Category Range association with higher incidence of chronic pain  <18 kg/m2 Underweight   18.5-24.9 kg/m2 Ideal body weight   25-29.9 kg/m2 Overweight Increased incidence by 20%  30-34.9 kg/m2 Obese (Class I) Increased incidence by 68%  35-39.9 kg/m2 Severe obesity (Class II) Increased incidence by 136%  >40 kg/m2 Extreme obesity (Class III) Increased incidence by 254%   BMI Readings from Last 4 Encounters:  03/22/17 41.79 kg/m  03/07/17 41.35 kg/m  03/03/17 41.35 kg/m  02/16/17 41.64 kg/m   Wt Readings from Last 4 Encounters:  03/22/17 283 lb (128.4 kg)  03/07/17 280 lb (127 kg)  03/03/17 280 lb (127 kg)  02/16/17 282 lb (127.9 kg)  Psych/Mental status: Alert, oriented x 3 (person, place, & time)       Eyes: PERLA Respiratory: No evidence of acute respiratory distress  Cervical Spine Exam  Inspection: Some swelling observed to the left of his cervical scar, over the trapezius area. No evidence of skin lesion such as bug bites. Alignment: Symmetrical Functional ROM: Diminished ROM      Stability: No instability detected Muscle strength & Tone: Functionally intact Sensory:  Musculoskeletal pain pattern. Exquisite tenderness to palpation over a 1-2 cm movable mass to the left of his cervical scar in the area of the left trapezius close to C7. Palpation: Trigger Point              Upper Extremity (UE) Exam    Side: Right upper extremity  Side: Left upper extremity  Inspection: No masses, redness, swelling, or asymmetry. No contractures  Inspection: No masses, redness, swelling, or asymmetry. No contractures  Functional ROM: Unrestricted ROM          Functional ROM: Unrestricted ROM          Muscle strength & Tone: Functionally intact  Muscle strength & Tone: Functionally intact  Sensory: Unimpaired  Sensory: Unimpaired  Palpation: No palpable anomalies              Palpation: No palpable anomalies              Specialized Test(s): Deferred         Specialized Test(s): Deferred          Thoracic Spine Exam  Inspection: No masses, redness, or swelling Alignment: Symmetrical Functional ROM: Unrestricted ROM Stability: No instability detected Sensory: Unimpaired Muscle strength & Tone: No palpable anomalies  Lumbar Spine Exam  Inspection: No masses, redness, or swelling Alignment: Symmetrical Functional ROM: Unrestricted ROM      Stability: No instability detected Muscle strength & Tone: Functionally intact Sensory: Unimpaired Palpation: No palpable anomalies       Provocative Tests: Lumbar Hyperextension and rotation test: evaluation deferred today       Patrick's Maneuver: evaluation deferred today                    Gait & Posture Assessment  Ambulation: Patient came in today in a wheel chair Gait: Very limited, using assistive device to ambulate Posture:  Antalgic   Lower Extremity Exam    Side: Right lower extremity  Side: Left lower extremity  Inspection: No masses, redness, swelling, or asymmetry. No contractures  Inspection: No masses, redness, swelling, or asymmetry. No contractures  Functional ROM: Decreased ROM for hip and knee joints   Functional ROM: Decreased ROM for hip and knee joints  Muscle strength & Tone: Functionally intact  Muscle strength & Tone: Functionally intact  Sensory: Arthropathic arthralgia  Sensory: Arthropathic arthralgia  Palpation: No palpable anomalies  Palpation: No palpable anomalies   Procedure:  Anesthesia, Analgesia, Anxiolysis:  Type: Trigger Point Injection Level: C7 Laterality: Left Position: Sitting Target Muscle: Trapezius muscle Approach: Percutaneous Region: Posterior  Cervicothoracic area. Primary Purpose: Palliative  Type: Local Anesthesia Local Anesthetic: Lidocaine 1% Route: Infiltration ([place]/IM) IV Access: Declined Sedation: Declined  Indication(s): Analgesia          Indications: 1. Chronic knee pain (Bilateral)   2. Primary osteoarthritis of left knee   3. Primary osteoarthritis of right knee   4. Osteoarthritis of knee (Bilateral)   5. Long term prescription opiate use   6. Opiate use (60 MME/Day)   7. Chronic pain syndrome   8. Cervical myofascial pain syndrome (Left trapezius)    Pain Score: Pre-procedure: 8 /10 Post-procedure: 0-No pain (patient states area in shoulder is now numb)/10  Pre-op Assessment:  Previous date of service: 03/07/17 Service provided: Procedure Mr. Brandon is a 71 y.o. (year old), male patient, seen today for interventional treatment. He  has a past surgical history that includes Knee surgery (Bilateral); Neck surgery; Carpal tunnel release; Shoulder surgery (Right); Cataract extraction, extracapsular (Left, 08/04/2015); and Cataract extraction w/PHACO (Right, 04/19/2016). Mr. Tagliaferro has a current medication list which includes the following prescription(s): acetaminophen, albuterol, aluminum hydroxide, amlodipine, antiseptic oral rinse, aspirin, atorvastatin, azelastine, capsaicin, carboxymethylcellul-glycerin, cetirizine, chlorpheniramine, clotrimazole, diclofenac sodium, docusate sodium, duloxetine, ergocalciferol, fluticasone, gabapentin,  glucagon, hydroxyzine, insulin aspart, insulin detemir, lidocaine, lisinopril, magnesium, metformin, methocarbamol, metoprolol tartrate, mometasone, morphine, morphine, morphine, multivitamin, naloxone, naproxen, fish oil, omeprazole, oxycodone, oxycodone, oxycodone, oxygen-helium, pantoprazole, polyethylene glycol powder, promethazine, senna, sitagliptin-metformin, testosterone cypionate, trazodone, vitamin c, and vitamin a & d. His primarily concern today is the Knee Pain (right)  Initial Vital Signs: Blood pressure 133/84, pulse 90, temperature 97.7 F (36.5 C), temperature source Oral, resp. rate 18, height _0  (1.753 m), weight 283 lb (128.4 kg), SpO2 99 %. BMI: 41.79 kg/m  Risk Assessment: Allergies: Reviewed. He has No Known Allergies.  Allergy Precautions: None required Coagulopathies: Reviewed. None identified.  Blood-thinner therapy: None at this time Active Infection(s): Reviewed. None identified. Mr. Mannis is afebrile  Site Confirmation: Mr. Diesel was asked to confirm the procedure and laterality before marking the site Procedure checklist: Completed Consent: Before the procedure and under the influence of no sedative(s), amnesic(s), or anxiolytics, the patient was informed of the treatment options, risks and possible complications. To fulfill our ethical and legal obligations, as recommended by the American Medical Association's Code of Ethics, I have informed the patient of my clinical impression; the nature and purpose of the treatment or procedure; the risks, benefits, and possible complications of the intervention; the alternatives, including doing nothing; the risk(s) and benefit(s) of the alternative treatment(s) or procedure(s); and the risk(s) and benefit(s) of doing nothing. The patient was provided information about the general risks and possible complications associated with the procedure. These may include, but are not limited to: failure to achieve desired goals,  infection, bleeding, organ or nerve damage, allergic reactions, paralysis,  and death. In addition, the patient was informed of those risks and complications associated to the procedure, such as failure to decrease pain; infection; bleeding; organ or nerve damage with subsequent damage to sensory, motor, and/or autonomic systems, resulting in permanent pain, numbness, and/or weakness of one or several areas of the body; allergic reactions; (i.e.: anaphylactic reaction); and/or death. Furthermore, the patient was informed of those risks and complications associated with the medications. These include, but are not limited to: allergic reactions (i.e.: anaphylactic or anaphylactoid reaction(s)); adrenal axis suppression; blood sugar elevation that in diabetics may result in ketoacidosis or comma; water retention that in patients with history of congestive heart failure may result in shortness of breath, pulmonary edema, and decompensation with resultant heart failure; weight gain; swelling or edema; medication-induced neural toxicity; particulate matter embolism and blood vessel occlusion with resultant organ, and/or nervous system infarction; and/or aseptic necrosis of one or more joints. Finally, the patient was informed that Medicine is not an exact science; therefore, there is also the possibility of unforeseen or unpredictable risks and/or possible complications that may result in a catastrophic outcome. The patient indicated having understood very clearly. We have given the patient no guarantees and we have made no promises. Enough time was given to the patient to ask questions, all of which were answered to the patient's satisfaction. Mr. Adkison has indicated that he wanted to continue with the procedure. Attestation: I, the ordering provider, attest that I have discussed with the patient the benefits, risks, side-effects, alternatives, likelihood of achieving goals, and potential problems during recovery  for the procedure that I have provided informed consent. Date: 03/22/2017; Time: 9:45 AM  Pre-Procedure Preparation:  Monitoring: As per clinic protocol. Respiration, ETCO2, SpO2, BP, heart rate and rhythm monitor placed and checked for adequate function Safety Precautions: Patient was assessed for positional comfort and pressure points before starting the procedure. Time-out: I initiated and conducted the "Time-out" before starting the procedure, as per protocol. The patient was asked to participate by confirming the accuracy of the "Time Out" information. Verification of the correct person, site, and procedure were performed and confirmed by me, the nursing staff, and the patient. "Time-out" conducted as per Joint Commission's Universal Protocol (UP.01.01.01). "Time-out" Date & Time: 03/22/2017; 0951 hrs.  Description of Procedure Process:   Prepping solution: ChloraPrep (2% chlorhexidine gluconate and 70% isopropyl alcohol) Safety Precautions: Aspiration looking for blood return was conducted prior to all injections. At no point did we inject any substances, as a needle was being advanced. No attempts were made at seeking any paresthesias. Safe injection practices and needle disposal techniques used. Medications properly checked for expiration dates. SDV (single dose vial) medications used. Description of the Procedure: Protocol guidelines were followed. The patient was placed in position over the fluoroscopy table. The target area was identified and the area prepped in the usual manner. Skin desensitized using vapocoolant spray. Skin & deeper tissues infiltrated with local anesthetic. Appropriate amount of time allowed to pass for local anesthetics to take effect. The procedure needles were then advanced to the target area. Proper needle placement secured. Negative aspiration confirmed. Solution injected in intermittent fashion, asking for systemic symptoms every 0.5cc of injectate. The needles were then  removed and the area cleansed, making sure to leave some of the prepping solution back to take advantage of its long term bactericidal properties. Vitals:   03/22/17 0859  BP: 133/84  Pulse: 90  Resp: 18  Temp: 97.7 F (36.5 C)  TempSrc: Oral  SpO2: 99%  Weight: 283 lb (128.4 kg)  Height: _0  (1.753 m)    Start Time: 0951 hrs. End Time: 0952 hrs. Materials:  Needle(s) Type: Epidural needle Gauge: 25G Length: 1.5-in Medication(s): We administered methylPREDNISolone acetate, lidocaine (PF), and ropivacaine (PF) 2 mg/mL (0.2%). Please see chart orders for dosing details.  Assessment  Primary Diagnosis & Pertinent Problem List: The primary encounter diagnosis was Chronic knee pain (Bilateral). Diagnoses of Primary osteoarthritis of left knee, Primary osteoarthritis of right knee, Osteoarthritis of knee (Bilateral), Long term prescription opiate use, Opiate use (60 MME/Day), Chronic pain syndrome, and Cervical myofascial pain syndrome (Left trapezius) were also pertinent to this visit.  Status Diagnosis  Controlled Controlled Controlled 1. Chronic knee pain (Bilateral)   2. Primary osteoarthritis of left knee   3. Primary osteoarthritis of right knee   4. Osteoarthritis of knee (Bilateral)   5. Long term prescription opiate use   6. Opiate use (60 MME/Day)   7. Chronic pain syndrome   8. Cervical myofascial pain syndrome (Left trapezius)     Problems updated and reviewed during this visit: Problem  Cervical myofascial pain syndrome (Left trapezius)   Plan of Care  Pharmacotherapy (Medications Ordered): Meds ordered this encounter  Medications  . DISCONTD: ropivacaine (PF) 5 mg/mL (0.5%) (NAROPIN) injection 5 mL    Preservative-free (MPF), single use vial.  . methylPREDNISolone acetate (DEPO-MEDROL) injection 80 mg  . lidocaine (PF) (XYLOCAINE) 1 % injection 10 mL  . ropivacaine (PF) 2 mg/mL (0.2%) (NAROPIN) injection 4 mL   New Prescriptions   No medications on file     Medications administered today: We administered methylPREDNISolone acetate, lidocaine (PF), and ropivacaine (PF) 2 mg/mL (0.2%).  Lab-work, procedure(s), and/or referral(s): Orders Placed This Encounter  Procedures  . KNEE INJECTION  . TRIGGER POINT INJECTION  . Comprehensive metabolic panel  . C-reactive protein  . Sedimentation rate  . Magnesium  . 25-Hydroxyvitamin D Lcms D2+D3  . Vitamin B12  . Ambulatory referral to Physical Therapy  . Informed Consent Details: Transcribe to consent form and obtain patient signature  . Provider attestation of informed consent for procedure/surgical case  . Verify informed consent  . Discharge instructions  . Follow-up    Interventional management options: Planned, scheduled, and/or pending:   Diagnosticbilateral series of 5 intra-articular Hyalgan knee injection   Considering:   Diagnostic bilateral suprascapular nerve block Diagnosticbilateral series of 5 intra-articular Hyalgan knee injection Palliative bilateral lumbar facet radiofrequencyablation. (Right done 07/08/2016; 10/02/15 before that. Left done 12/09/2015)  Right intra-articular shoulder injection Possible intra-articular right knee injection Palliative right cervical epidural steroidinjection  Palliative bilateral genicular nerve RFA.    Palliative PRN treatment(s):   Palliative bilateral lumbar facet block Palliative bilateral intra-articular Hyalgan knee injection   Provider-requested follow-up: Return for NS procedure, by MD.  Future Appointments Date Time Provider [place]  03/28/2017 9:00 AM Milinda Pointer, MD [facility] None  06/02/2017 9:30 AM Vevelyn Francois, NP [facility] None   Primary Care Physician: Gareth Morgan, MD Location: [facility] Facility Note by: Kathlen Brunswick. Dossie Arbour, M.D, DABA, DABAPM, DABPM, DABIPP, FIPP Date: 03/22/2017; Time: 10:11 AM  Patient Instructions   ____________________________________________________________________________________________  Post-Procedure instructions Instructions:  Apply ice: Fill a plastic sandwich bag with crushed ice. Cover it with a small towel and apply to injection site. Apply for 15 minutes then remove x 15 minutes. Repeat sequence on day of procedure, until you go to bed. The purpose is to minimize swelling and discomfort after  procedure.  Apply heat: Apply heat to procedure site starting the day following the procedure. The purpose is to treat any soreness and discomfort from the procedure.  Food intake: Start with clear liquids (like water) and advance to regular food, as tolerated.   Physical activities: Keep activities to a minimum for the first 8 hours after the procedure.   Driving: If you have received any sedation, you are not allowed to drive for 24 hours after your procedure.  Blood thinner: Restart your blood thinner 6 hours after your procedure. (Only for those taking blood thinners)  Insulin: As soon as you can eat, you may resume your normal dosing schedule. (Only for those taking insulin)  Infection prevention: Keep procedure site clean and dry.  Post-procedure Pain Diary: Extremely important that this be done correctly and accurately. Recorded information will be used to determine the next step in treatment.  Pain evaluated is that of treated area only. Do not include pain from an untreated area.  Complete every hour, on the hour, for the initial 8 hours. Set an alarm to help you do this part accurately.  Do not go to sleep and have it completed later. It will not be accurate.  Follow-up appointment: Keep your follow-up appointment after the procedure. Usually 2 weeks for most procedures. (6 weeks in the case of radiofrequency.) Bring you pain diary.  Expect:  From numbing medicine (AKA: Local Anesthetics): Numbness or decrease in pain.  Onset: Full effect within 15 minutes of  injected.  Duration: It will depend on the type of local anesthetic used. On the average, 1 to 8 hours.   From steroids: Decrease in swelling or inflammation. Once inflammation is improved, relief of the pain will follow.  Onset of benefits: Depends on the amount of swelling present. The more swelling, the longer it will take for the benefits to be seen. In some cases, up to 10 days.  Duration: Steroids will stay in the system x 2 weeks. Duration of benefits will depend on multiple posibilities including persistent irritating factors.  From procedure: Some discomfort is to be expected once the numbing medicine wears off. This should be minimal if ice and heat are applied as instructed. Call if:  You experience numbness and weakness that gets worse with time, as opposed to wearing off.  New onset bowel or bladder incontinence. (Spinal procedures only)  Emergency Numbers:  Durning business hours (Monday - Thursday, 8:00 AM - 4:00 PM) (Friday, 9:00 AM - 12:00 Noon): [phone]  After hours: [phone] ____________________________________________________________________________________________  ____________________________________________________________________________________________  Preparing for your procedure (without sedation) Instructions: . Oral Intake: Do not eat or drink anything for at least 3 hours prior to your procedure. . Transportation: Unless otherwise stated by your physician, you may drive yourself after the procedure. . Blood Pressure Medicine: Take your blood pressure medicine with a sip of water the morning of the procedure. . Blood thinners:  . Diabetics on insulin: Notify the staff so that you can be scheduled 1st case in the morning. If your diabetes requires high dose insulin, take only  of your normal insulin dose the morning of the procedure and notify the staff that you have done so. . Preventing infections: Shower with an antibacterial soap the  morning of your procedure.  . Build-up your immune system: Take 1000 mg of Vitamin C with every meal (3 times a day) the day prior to your procedure. Marland Kitchen Antibiotics: Inform the staff if you have a condition or reason  that requires you to take antibiotics before dental procedures. . Pregnancy: If you are pregnant, call and cancel the procedure. . Sickness: If you have a cold, fever, or any active infections, call and cancel the procedure. . Arrival: You must be in the facility at least 30 minutes prior to your scheduled procedure. . Children: Do not bring any children with you. . Dress appropriately: Bring dark clothing that you would not mind if they get stained. . Valuables: Do not bring any jewelry or valuables. Procedure appointments are reserved for interventional treatments only. Marland Kitchen No Prescription Refills. . No medication changes will be discussed during procedure appointments. . No disability issues will be discussed. ____________________________________________________________________________________________   Trigger Point Injection Trigger points are areas where you have pain. A trigger point injection is a shot given in the trigger point to help relieve pain for a few days to a few months. Common places for trigger points include:  The neck.  The shoulders.  The upper back.  The lower back.  A trigger point injection will not cure long-lasting (chronic) pain permanently. These injections do not always work for every person, but for some people they can help to relieve pain for a few days to a few months. Tell a health care provider about:  Any allergies you have.  All medicines you are taking, including vitamins, herbs, eye drops, creams, and over-the-counter medicines.  Any problems you or family members have had with anesthetic medicines.  Any blood disorders you have.  Any surgeries you have had.  Any medical conditions you have. What are the risks? Generally,  this is a safe procedure. However, problems may occur, including:  Infection.  Bleeding.  Allergic reaction to the injected medicine.  Irritation of the skin around the injection site.  What happens before the procedure?  Ask your health care provider about changing or stopping your regular medicines. This is especially important if you are taking diabetes medicines or blood thinners. What happens during the procedure?  Your health care provider will feel for trigger points. A marker may be used to circle the area for the injection.  The skin over the trigger point will be washed with a germ-killing (antiseptic) solution.  A thin needle is used for the shot. You may feel pain or a twitching feeling when the needle enters the trigger point.  A numbing solution may be injected into the trigger point. Sometimes a medicine to keep down swelling, redness, and warmth (inflammation) is also injected.  Your health care provider may move the needle around the area where the trigger point is located until the tightness and twitching goes away.  After the injection, your health care provider may put gentle pressure over the injection site.  The injection site will be covered with a bandage (dressing). The procedure may vary among health care providers and hospitals. What happens after the procedure?  The dressing can be taken off in a few hours or as told by your health care provider.  You may feel sore and stiff for 1-2 days. This information is not intended to replace advice given to you by your health care provider. Make sure you discuss any questions you have with your health care provider. Document Released: 08/26/2011 Document Revised: 05/09/2016 Document Reviewed: 02/24/2015 Elsevier Interactive Patient Education  2018 [place]. Pain Management Discharge Instructions  General Discharge Instructions :  If you need to reach your doctor call: Monday-Friday 8:00 am - 4:00 pm at  [phone] or toll free  [phone].  After clinic hours [phone] to have operator reach doctor.  Bring all of your medication bottles to all your appointments in the pain clinic.  To cancel or reschedule your appointment with Pain Management please remember to call 24 hours in advance to avoid a fee.  Refer to the educational materials which you have been given on: General Risks, I had my Procedure. Discharge Instructions, Post Sedation.  Post Procedure Instructions:  The drugs you were given will stay in your system until tomorrow, so for the next 24 hours you should not drive, make any legal decisions or drink any alcoholic beverages.  You may eat anything you prefer, but it is better to start with liquids then soups and crackers, and gradually work up to solid foods.  Please notify your doctor immediately if you have any unusual bleeding, trouble breathing or pain that is not related to your normal pain.  Depending on the type of procedure that was done, some parts of your body may feel week and/or numb.  This usually clears up by tonight or the next day.  Walk with the use of an assistive device or accompanied by an adult for the 24 hours.  You may use ice on the affected area for the first 24 hours.  Put ice in a Ziploc bag and cover with a towel and place against area 15 minutes on 15 minutes off.  You may switch to heat after 24 hours.

## 2017-03-22 ENCOUNTER — Telehealth: Payer: Self-pay | Admitting: *Deleted

## 2017-03-22 ENCOUNTER — Encounter: Payer: Self-pay | Admitting: Pain Medicine

## 2017-03-22 ENCOUNTER — Ambulatory Visit: Payer: Medicare (Managed Care) | Attending: Pain Medicine | Admitting: Pain Medicine

## 2017-03-22 VITALS — BP 133/84 | HR 90 | Temp 97.7°F | Resp 18 | Ht 69.0 in | Wt 283.0 lb

## 2017-03-22 DIAGNOSIS — M48061 Spinal stenosis, lumbar region without neurogenic claudication: Secondary | ICD-10-CM | POA: Diagnosis not present

## 2017-03-22 DIAGNOSIS — E785 Hyperlipidemia, unspecified: Secondary | ICD-10-CM | POA: Insufficient documentation

## 2017-03-22 DIAGNOSIS — Z7982 Long term (current) use of aspirin: Secondary | ICD-10-CM | POA: Diagnosis not present

## 2017-03-22 DIAGNOSIS — G4733 Obstructive sleep apnea (adult) (pediatric): Secondary | ICD-10-CM | POA: Insufficient documentation

## 2017-03-22 DIAGNOSIS — Z794 Long term (current) use of insulin: Secondary | ICD-10-CM | POA: Insufficient documentation

## 2017-03-22 DIAGNOSIS — M4316 Spondylolisthesis, lumbar region: Secondary | ICD-10-CM | POA: Insufficient documentation

## 2017-03-22 DIAGNOSIS — F419 Anxiety disorder, unspecified: Secondary | ICD-10-CM | POA: Diagnosis not present

## 2017-03-22 DIAGNOSIS — M25562 Pain in left knee: Secondary | ICD-10-CM | POA: Diagnosis not present

## 2017-03-22 DIAGNOSIS — M5126 Other intervertebral disc displacement, lumbar region: Secondary | ICD-10-CM | POA: Insufficient documentation

## 2017-03-22 DIAGNOSIS — M542 Cervicalgia: Secondary | ICD-10-CM | POA: Insufficient documentation

## 2017-03-22 DIAGNOSIS — M791 Myalgia: Secondary | ICD-10-CM | POA: Insufficient documentation

## 2017-03-22 DIAGNOSIS — M5416 Radiculopathy, lumbar region: Secondary | ICD-10-CM | POA: Diagnosis not present

## 2017-03-22 DIAGNOSIS — M533 Sacrococcygeal disorders, not elsewhere classified: Secondary | ICD-10-CM | POA: Diagnosis not present

## 2017-03-22 DIAGNOSIS — G8929 Other chronic pain: Secondary | ICD-10-CM | POA: Diagnosis not present

## 2017-03-22 DIAGNOSIS — Z87891 Personal history of nicotine dependence: Secondary | ICD-10-CM | POA: Diagnosis not present

## 2017-03-22 DIAGNOSIS — M25511 Pain in right shoulder: Secondary | ICD-10-CM | POA: Insufficient documentation

## 2017-03-22 DIAGNOSIS — Z79891 Long term (current) use of opiate analgesic: Secondary | ICD-10-CM | POA: Diagnosis not present

## 2017-03-22 DIAGNOSIS — J449 Chronic obstructive pulmonary disease, unspecified: Secondary | ICD-10-CM | POA: Insufficient documentation

## 2017-03-22 DIAGNOSIS — M4722 Other spondylosis with radiculopathy, cervical region: Secondary | ICD-10-CM | POA: Insufficient documentation

## 2017-03-22 DIAGNOSIS — G894 Chronic pain syndrome: Secondary | ICD-10-CM

## 2017-03-22 DIAGNOSIS — M170 Bilateral primary osteoarthritis of knee: Secondary | ICD-10-CM

## 2017-03-22 DIAGNOSIS — M1712 Unilateral primary osteoarthritis, left knee: Secondary | ICD-10-CM | POA: Diagnosis not present

## 2017-03-22 DIAGNOSIS — F1190 Opioid use, unspecified, uncomplicated: Secondary | ICD-10-CM

## 2017-03-22 DIAGNOSIS — M7918 Myalgia, other site: Secondary | ICD-10-CM

## 2017-03-22 DIAGNOSIS — M25561 Pain in right knee: Secondary | ICD-10-CM | POA: Insufficient documentation

## 2017-03-22 DIAGNOSIS — I10 Essential (primary) hypertension: Secondary | ICD-10-CM | POA: Diagnosis not present

## 2017-03-22 DIAGNOSIS — M25512 Pain in left shoulder: Secondary | ICD-10-CM | POA: Insufficient documentation

## 2017-03-22 DIAGNOSIS — K219 Gastro-esophageal reflux disease without esophagitis: Secondary | ICD-10-CM | POA: Insufficient documentation

## 2017-03-22 DIAGNOSIS — J45909 Unspecified asthma, uncomplicated: Secondary | ICD-10-CM | POA: Insufficient documentation

## 2017-03-22 DIAGNOSIS — M1711 Unilateral primary osteoarthritis, right knee: Secondary | ICD-10-CM | POA: Diagnosis not present

## 2017-03-22 DIAGNOSIS — E1140 Type 2 diabetes mellitus with diabetic neuropathy, unspecified: Secondary | ICD-10-CM | POA: Diagnosis not present

## 2017-03-22 MED ORDER — LIDOCAINE HCL (PF) 1 % IJ SOLN
INTRAMUSCULAR | Status: AC
  Filled 2017-03-22: qty 5

## 2017-03-22 MED ORDER — LIDOCAINE HCL (PF) 1 % IJ SOLN
10.0000 mL | Freq: Once | INTRAMUSCULAR | Status: AC
  Administered 2017-03-22: 5 mL

## 2017-03-22 MED ORDER — ROPIVACAINE HCL 5 MG/ML IJ SOLN
5.0000 mL | Freq: Once | INTRAMUSCULAR | Status: DC
  Filled 2017-03-22: qty 5

## 2017-03-22 MED ORDER — ROPIVACAINE HCL 2 MG/ML IJ SOLN
4.0000 mL | Freq: Once | INTRAMUSCULAR | Status: AC
  Administered 2017-03-22: 5 mL

## 2017-03-22 MED ORDER — METHYLPREDNISOLONE ACETATE 80 MG/ML IJ SUSP
INTRAMUSCULAR | Status: AC
  Filled 2017-03-22: qty 1

## 2017-03-22 MED ORDER — ROPIVACAINE HCL 2 MG/ML IJ SOLN
INTRAMUSCULAR | Status: AC
  Filled 2017-03-22: qty 10

## 2017-03-22 MED ORDER — METHYLPREDNISOLONE ACETATE 80 MG/ML IJ SUSP
80.0000 mg | Freq: Once | INTRAMUSCULAR | Status: AC
  Administered 2017-03-22: 80 mg

## 2017-03-22 NOTE — Telephone Encounter (Signed)
Called patient to let him know that labs were ordered today and I failed to send him for the draw.  Voicemail left to see if he could return today or possibly Thursday of this week.  Asked him to call and let us know when he might be able to return.

## 2017-03-22 NOTE — Patient Instructions (Addendum)
____________________________________________________________________________________________  Post-Procedure instructions Instructions:  Apply ice: Fill a plastic sandwich bag with crushed ice. Cover it with a small towel and apply to injection site. Apply for 15 minutes then remove x 15 minutes. Repeat sequence on day of procedure, until you go to bed. The purpose is to minimize swelling and discomfort after procedure.  Apply heat: Apply heat to procedure site starting the day following the procedure. The purpose is to treat any soreness and discomfort from the procedure.  Food intake: Start with clear liquids (like water) and advance to regular food, as tolerated.   Physical activities: Keep activities to a minimum for the first 8 hours after the procedure.   Driving: If you have received any sedation, you are not allowed to drive for 24 hours after your procedure.  Blood thinner: Restart your blood thinner 6 hours after your procedure. (Only for those taking blood thinners)  Insulin: As soon as you can eat, you may resume your normal dosing schedule. (Only for those taking insulin)  Infection prevention: Keep procedure site clean and dry.  Post-procedure Pain Diary: Extremely important that this be done correctly and accurately. Recorded information will be used to determine the next step in treatment.  Pain evaluated is that of treated area only. Do not include pain from an untreated area.  Complete every hour, on the hour, for the initial 8 hours. Set an alarm to help you do this part accurately.  Do not go to sleep and have it completed later. It will not be accurate.  Follow-up appointment: Keep your follow-up appointment after the procedure. Usually 2 weeks for most procedures. (6 weeks in the case of radiofrequency.) Bring you pain diary.  Expect:  From numbing medicine (AKA: Local Anesthetics): Numbness or decrease in pain.  Onset: Full effect within 15 minutes of  injected.  Duration: It will depend on the type of local anesthetic used. On the average, 1 to 8 hours.   From steroids: Decrease in swelling or inflammation. Once inflammation is improved, relief of the pain will follow.  Onset of benefits: Depends on the amount of swelling present. The more swelling, the longer it will take for the benefits to be seen. In some cases, up to 10 days.  Duration: Steroids will stay in the system x 2 weeks. Duration of benefits will depend on multiple posibilities including persistent irritating factors.  From procedure: Some discomfort is to be expected once the numbing medicine wears off. This should be minimal if ice and heat are applied as instructed. Call if:  You experience numbness and weakness that gets worse with time, as opposed to wearing off.  New onset bowel or bladder incontinence. (Spinal procedures only)  Emergency Numbers:  Durning business hours (Monday - Thursday, 8:00 AM - 4:00 PM) (Friday, 9:00 AM - 12:00 Noon): [phone]  After hours: [phone] ____________________________________________________________________________________________  ____________________________________________________________________________________________  Preparing for your procedure (without sedation) Instructions: . Oral Intake: Do not eat or drink anything for at least 3 hours prior to your procedure. . Transportation: Unless otherwise stated by your physician, you may drive yourself after the procedure. . Blood Pressure Medicine: Take your blood pressure medicine with a sip of water the morning of the procedure. . Blood thinners:  . Diabetics on insulin: Notify the staff so that you can be scheduled 1st case in the morning. If your diabetes requires high dose insulin, take only  of your normal insulin dose the morning of the procedure and notify the staff   that you have done so. . Preventing infections: Shower with an antibacterial soap the  morning of your procedure.  . Build-up your immune system: Take 1000 mg of Vitamin C with every meal (3 times a day) the day prior to your procedure. Marland Kitchen Antibiotics: Inform the staff if you have a condition or reason that requires you to take antibiotics before dental procedures. . Pregnancy: If you are pregnant, call and cancel the procedure. . Sickness: If you have a cold, fever, or any active infections, call and cancel the procedure. . Arrival: You must be in the facility at least 30 minutes prior to your scheduled procedure. . Children: Do not bring any children with you. . Dress appropriately: Bring dark clothing that you would not mind if they get stained. . Valuables: Do not bring any jewelry or valuables. Procedure appointments are reserved for interventional treatments only. Marland Kitchen No Prescription Refills. . No medication changes will be discussed during procedure appointments. . No disability issues will be discussed. ____________________________________________________________________________________________   Trigger Point Injection Trigger points are areas where you have pain. A trigger point injection is a shot given in the trigger point to help relieve pain for a few days to a few months. Common places for trigger points include:  The neck.  The shoulders.  The upper back.  The lower back.  A trigger point injection will not cure long-lasting (chronic) pain permanently. These injections do not always work for every person, but for some people they can help to relieve pain for a few days to a few months. Tell a health care provider about:  Any allergies you have.  All medicines you are taking, including vitamins, herbs, eye drops, creams, and over-the-counter medicines.  Any problems you or family members have had with anesthetic medicines.  Any blood disorders you have.  Any surgeries you have had.  Any medical conditions you have. What are the risks? Generally,  this is a safe procedure. However, problems may occur, including:  Infection.  Bleeding.  Allergic reaction to the injected medicine.  Irritation of the skin around the injection site.  What happens before the procedure?  Ask your health care provider about changing or stopping your regular medicines. This is especially important if you are taking diabetes medicines or blood thinners. What happens during the procedure?  Your health care provider will feel for trigger points. A marker may be used to circle the area for the injection.  The skin over the trigger point will be washed with a germ-killing (antiseptic) solution.  A thin needle is used for the shot. You may feel pain or a twitching feeling when the needle enters the trigger point.  A numbing solution may be injected into the trigger point. Sometimes a medicine to keep down swelling, redness, and warmth (inflammation) is also injected.  Your health care provider may move the needle around the area where the trigger point is located until the tightness and twitching goes away.  After the injection, your health care provider may put gentle pressure over the injection site.  The injection site will be covered with a bandage (dressing). The procedure may vary among health care providers and hospitals. What happens after the procedure?  The dressing can be taken off in a few hours or as told by your health care provider.  You may feel sore and stiff for 1-2 days. This information is not intended to replace advice given to you by your health care provider. Make sure you discuss  any questions you have with your health care provider. Document Released: 08/26/2011 Document Revised: 05/09/2016 Document Reviewed: 02/24/2015 Elsevier Interactive Patient Education  2018 [employer] Pain Management Discharge Instructions  General Discharge Instructions :  If you need to reach your doctor call: Monday-Friday 8:00 am - 4:00 pm at  [phone] or toll free [phone].  After clinic hours [phone] to have operator reach doctor.  Bring all of your medication bottles to all your appointments in the pain clinic.  To cancel or reschedule your appointment with Pain Management please remember to call 24 hours in advance to avoid a fee.  Refer to the educational materials which you have been given on: General Risks, I had my Procedure. Discharge Instructions, Post Sedation.  Post Procedure Instructions:  The drugs you were given will stay in your system until tomorrow, so for the next 24 hours you should not drive, make any legal decisions or drink any alcoholic beverages.  You may eat anything you prefer, but it is better to start with liquids then soups and crackers, and gradually work up to solid foods.  Please notify your doctor immediately if you have any unusual bleeding, trouble breathing or pain that is not related to your normal pain.  Depending on the type of procedure that was done, some parts of your body may feel week and/or numb.  This usually clears up by tonight or the next day.  Walk with the use of an assistive device or accompanied by an adult for the 24 hours.  You may use ice on the affected area for the first 24 hours.  Put ice in a Ziploc bag and cover with a towel and place against area 15 minutes on 15 minutes off.  You may switch to heat after 24 hours.

## 2017-03-22 NOTE — Progress Notes (Signed)
Safety precautions to be maintained throughout the outpatient stay will include: orient to surroundings, keep bed in low position, maintain call bell within reach at all times, provide assistance with transfer out of bed and ambulation.  

## 2017-03-25 ENCOUNTER — Telehealth: Payer: Self-pay | Admitting: Pain Medicine

## 2017-03-25 NOTE — Telephone Encounter (Signed)
Dr. De BlanchBeth Rosenberg would like to speak with Dr. Laban EmperorNaveira before she will approve any future visits for [facility] [place]. [phone]

## 2017-03-28 ENCOUNTER — Ambulatory Visit: Payer: Medicare (Managed Care) | Attending: Pain Medicine | Admitting: Pain Medicine

## 2017-03-28 ENCOUNTER — Encounter: Payer: Self-pay | Admitting: Pain Medicine

## 2017-03-28 ENCOUNTER — Other Ambulatory Visit: Payer: Self-pay | Admitting: Pain Medicine

## 2017-03-28 ENCOUNTER — Telehealth: Payer: Self-pay

## 2017-03-28 VITALS — BP 119/77 | HR 76 | Temp 98.5°F | Resp 16 | Ht 69.0 in | Wt 282.0 lb

## 2017-03-28 DIAGNOSIS — M25561 Pain in right knee: Secondary | ICD-10-CM | POA: Diagnosis not present

## 2017-03-28 DIAGNOSIS — G8929 Other chronic pain: Secondary | ICD-10-CM | POA: Diagnosis not present

## 2017-03-28 DIAGNOSIS — M170 Bilateral primary osteoarthritis of knee: Secondary | ICD-10-CM | POA: Insufficient documentation

## 2017-03-28 DIAGNOSIS — M25562 Pain in left knee: Secondary | ICD-10-CM | POA: Diagnosis not present

## 2017-03-28 DIAGNOSIS — M1711 Unilateral primary osteoarthritis, right knee: Secondary | ICD-10-CM

## 2017-03-28 DIAGNOSIS — M1712 Unilateral primary osteoarthritis, left knee: Secondary | ICD-10-CM

## 2017-03-28 MED ORDER — ROPIVACAINE HCL 2 MG/ML IJ SOLN
INTRAMUSCULAR | Status: AC
  Filled 2017-03-28: qty 10

## 2017-03-28 MED ORDER — SODIUM HYALURONATE (VISCOSUP) 20 MG/2ML IX SOSY
2.0000 mL | PREFILLED_SYRINGE | Freq: Once | INTRA_ARTICULAR | Status: AC
  Administered 2017-03-28: 2 mL via INTRA_ARTICULAR
  Filled 2017-03-28: qty 2

## 2017-03-28 MED ORDER — LIDOCAINE HCL (PF) 1 % IJ SOLN
10.0000 mL | Freq: Once | INTRAMUSCULAR | Status: AC
  Administered 2017-03-28: 5 mL

## 2017-03-28 MED ORDER — ROPIVACAINE HCL 2 MG/ML IJ SOLN
4.0000 mL | Freq: Once | INTRAMUSCULAR | Status: AC
  Administered 2017-03-28: 10 mL via INTRA_ARTICULAR

## 2017-03-28 MED ORDER — LIDOCAINE HCL (PF) 1 % IJ SOLN
INTRAMUSCULAR | Status: AC
Start: 2017-03-28 — End: ?
  Filled 2017-03-28: qty 5

## 2017-03-28 NOTE — Patient Instructions (Addendum)
____________________________________________________________________________________________  Post-Procedure instructions Instructions:  Apply ice: Fill a plastic sandwich bag with crushed ice. Cover it with a small towel and apply to injection site. Apply for 15 minutes then remove x 15 minutes. Repeat sequence on day of procedure, until you go to bed. The purpose is to minimize swelling and discomfort after procedure.  Apply heat: Apply heat to procedure site starting the day following the procedure. The purpose is to treat any soreness and discomfort from the procedure.  Food intake: Start with clear liquids (like water) and advance to regular food, as tolerated.   Physical activities: Keep activities to a minimum for the first 8 hours after the procedure.   Driving: If you have received any sedation, you are not allowed to drive for 24 hours after your procedure.  Blood thinner: Restart your blood thinner 6 hours after your procedure. (Only for those taking blood thinners)  Insulin: As soon as you can eat, you may resume your normal dosing schedule. (Only for those taking insulin)  Infection prevention: Keep procedure site clean and dry.  Post-procedure Pain Diary: Extremely important that this be done correctly and accurately. Recorded information will be used to determine the next step in treatment.  Pain evaluated is that of treated area only. Do not include pain from an untreated area.  Complete every hour, on the hour, for the initial 8 hours. Set an alarm to help you do this part accurately.  Do not go to sleep and have it completed later. It will not be accurate.  Follow-up appointment: Keep your follow-up appointment after the procedure. Usually 2 weeks for most procedures. (6 weeks in the case of radiofrequency.) Bring you pain diary.  Expect:  From numbing medicine (AKA: Local Anesthetics): Numbness or decrease in pain.  Onset: Full effect within 15 minutes of  injected.  Duration: It will depend on the type of local anesthetic used. On the average, 1 to 8 hours.   From steroids: Decrease in swelling or inflammation. Once inflammation is improved, relief of the pain will follow.  Onset of benefits: Depends on the amount of swelling present. The more swelling, the longer it will take for the benefits to be seen. In some cases, up to 10 days.  Duration: Steroids will stay in the system x 2 weeks. Duration of benefits will depend on multiple posibilities including persistent irritating factors.  From procedure: Some discomfort is to be expected once the numbing medicine wears off. This should be minimal if ice and heat are applied as instructed. Call if:  You experience numbness and weakness that gets worse with time, as opposed to wearing off.  New onset bowel or bladder incontinence. (Spinal procedures only)  Emergency Numbers:  Durning business hours (Monday - Thursday, 8:00 AM - 4:00 PM) (Friday, 9:00 AM - 12:00 Noon): [phone]  After hours: [phone] ____________________________________________________________________________________________  ____________________________________________________________________________________________  Preparing for your procedure (without sedation) Instructions: . Oral Intake: Do not eat or drink anything for at least 3 hours prior to your procedure. . Transportation: Unless otherwise stated by your physician, you may drive yourself after the procedure. . Blood Pressure Medicine: Take your blood pressure medicine with a sip of water the morning of the procedure. . Blood thinners:  . Diabetics on insulin: Notify the staff so that you can be scheduled 1st case in the morning. If your diabetes requires high dose insulin, take only  of your normal insulin dose the morning of the procedure and notify the staff   that you have done so. . Preventing infections: Shower with an antibacterial soap the  morning of your procedure.  . Build-up your immune system: Take 1000 mg of Vitamin C with every meal (3 times a day) the day prior to your procedure. . Antibiotics: Inform the staff if you have a condition or reason that requires you to take antibiotics before dental procedures. . Pregnancy: If you are pregnant, call and cancel the procedure. . Sickness: If you have a cold, fever, or any active infections, call and cancel the procedure. . Arrival: You must be in the facility at least 30 minutes prior to your scheduled procedure. . Children: Do not bring any children with you. . Dress appropriately: Bring dark clothing that you would not mind if they get stained. . Valuables: Do not bring any jewelry or valuables. Procedure appointments are reserved for interventional treatments only. . No Prescription Refills. . No medication changes will be discussed during procedure appointments. . No disability issues will be discussed. ____________________________________________________________________________________________  Pain Management Discharge Instructions  General Discharge Instructions :  If you need to reach your doctor call: Monday-Friday 8:00 am - 4:00 pm at [phone] or toll free [phone].  After clinic hours [phone] to have operator reach doctor.  Bring all of your medication bottles to all your appointments in the pain clinic.  To cancel or reschedule your appointment with Pain Management please remember to call 24 hours in advance to avoid a fee.  Refer to the educational materials which you have been given on: General Risks, I had my Procedure. Discharge Instructions, Post Sedation.  Post Procedure Instructions:  The drugs you were given will stay in your system until tomorrow, so for the next 24 hours you should not drive, make any legal decisions or drink any alcoholic beverages.  You may eat anything you prefer, but it is better to start with liquids then soups  and crackers, and gradually work up to solid foods.  Please notify your doctor immediately if you have any unusual bleeding, trouble breathing or pain that is not related to your normal pain.  Depending on the type of procedure that was done, some parts of your body may feel week and/or numb.  This usually clears up by tonight or the next day.  Walk with the use of an assistive device or accompanied by an adult for the 24 hours.  You may use ice on the affected area for the first 24 hours.  Put ice in a Ziploc bag and cover with a towel and place against area 15 minutes on 15 minutes off.  You may switch to heat after 24 hours. Knee Injection A knee injection is a procedure to get medicine into your knee joint. Your health care provider puts a needle into the joint and injects medicine with an attached syringe. The injected medicine may relieve the pain, swelling, and stiffness of arthritis. The injected medicine may also help to lubricate and cushion your knee joint. You may need more than one injection. Tell a health care provider about:  Any allergies you have.  All medicines you are taking, including vitamins, herbs, eye drops, creams, and over-the-counter medicines.  Any problems you or family members have had with anesthetic medicines.  Any blood disorders you have.  Any surgeries you have had.  Any medical conditions you have. What are the risks? Generally, this is a safe procedure. However, problems may occur, including:  Infection.  Bleeding.  Worsening symptoms.  Damage to the   area around your knee.  Allergic reaction to any of the medicines.  Skin reactions from repeated injections.  What happens before the procedure?  Ask your health care provider about changing or stopping your regular medicines. This is especially important if you are taking diabetes medicines or blood thinners.  Plan to have someone take you home after the procedure. What happens during the  procedure?  You will sit or lie down in a position for your knee to be treated.  The skin over your kneecap will be cleaned with a germ-killing solution (antiseptic).  You will be given a medicine that numbs the area (local anesthetic). You may feel some stinging.  After your knee becomes numb, you will have a second injection. This is the medicine. This needle is carefully placed between your kneecap and your knee. The medicine is injected into the joint space.  At the end of the procedure, the needle will be removed.  A bandage (dressing) may be placed over the injection site. The procedure may vary among health care providers and hospitals. What happens after the procedure?  You may have to move your knee through its full range of motion. This helps to get all of the medicine into your joint space.  Your blood pressure, heart rate, breathing rate, and blood oxygen level will be monitored often until the medicines you were given have worn off.  You will be watched to make sure that you do not have a reaction to the injected medicine. This information is not intended to replace advice given to you by your health care provider. Make sure you discuss any questions you have with your health care provider. Document Released: 11/28/2006 Document Revised: 02/06/2016 Document Reviewed: 07/17/2014 Elsevier Interactive Patient Education  2018 [employer] GENERAL RISKS AND COMPLICATIONS  What are the risk, side effects and possible complications? Generally speaking, most procedures are safe.  However, with any procedure there are risks, side effects, and the possibility of complications.  The risks and complications are dependent upon the sites that are lesioned, or the type of nerve block to be performed.  The closer the procedure is to the spine, the more serious the risks are.  Great care is taken when placing the radio frequency needles, block needles or lesioning probes, but sometimes  complications can occur. 1. Infection: Any time there is an injection through the skin, there is a risk of infection.  This is why sterile conditions are used for these blocks.  There are four possible types of infection. 1. Localized skin infection. 2. Central Nervous System Infection-This can be in the form of Meningitis, which can be deadly. 3. Epidural Infections-This can be in the form of an epidural abscess, which can cause pressure inside of the spine, causing compression of the spinal cord with subsequent paralysis. This would require an emergency surgery to decompress, and there are no guarantees that the patient would recover from the paralysis. 4. Discitis-This is an infection of the intervertebral discs.  It occurs in about 1% of discography procedures.  It is difficult to treat and it may lead to surgery.        2. Pain: the needles have to go through skin and soft tissues, will cause soreness.       3. Damage to internal structures:  The nerves to be lesioned may be near blood vessels or    other nerves which can be potentially damaged.       4. Bleeding: Bleeding is more   common if the patient is taking blood thinners such as  aspirin, Coumadin, Ticiid, Plavix, etc., or if he/she have some genetic predisposition  such as hemophilia. Bleeding into the spinal canal can cause compression of the spinal  cord with subsequent paralysis.  This would require an emergency surgery to  decompress and there are no guarantees that the patient would recover from the  paralysis.       5. Pneumothorax:  Puncturing of a lung is a possibility, every time a needle is introduced in  the area of the chest or upper back.  Pneumothorax refers to free air around the  collapsed lung(s), inside of the thoracic cavity (chest cavity).  Another two possible  complications related to a similar event would include: Hemothorax and Chylothorax.   These are variations of the Pneumothorax, where instead of air around the  collapsed  lung(s), you may have blood or chyle, respectively.       6. Spinal headaches: They may occur with any procedures in the area of the spine.       7. Persistent CSF (Cerebro-Spinal Fluid) leakage: This is a rare problem, but may occur  with prolonged intrathecal or epidural catheters either due to the formation of a fistulous  track or a dural tear.       8. Nerve damage: By working so close to the spinal cord, there is always a possibility of  nerve damage, which could be as serious as a permanent spinal cord injury with  paralysis.       9. Death:  Although rare, severe deadly allergic reactions known as "Anaphylactic  reaction" can occur to any of the medications used.      10. Worsening of the symptoms:  We can always make thing worse.  What are the chances of something like this happening? Chances of any of this occuring are extremely low.  By statistics, you have more of a chance of getting killed in a motor vehicle accident: while driving to the hospital than any of the above occurring .  Nevertheless, you should be aware that they are possibilities.  In general, it is similar to taking a shower.  Everybody knows that you can slip, hit your head and get killed.  Does that mean that you should not shower again?  Nevertheless always keep in mind that statistics do not mean anything if you happen to be on the wrong side of them.  Even if a procedure has a 1 (one) in a 1,000,000 (million) chance of going wrong, it you happen to be that one..Also, keep in mind that by statistics, you have more of a chance of having something go wrong when taking medications.  Who should not have this procedure? If you are on a blood thinning medication (e.g. Coumadin, Plavix, see list of "Blood Thinners"), or if you have an active infection going on, you should not have the procedure.  If you are taking any blood thinners, please inform your physician.  How should I prepare for this procedure?  Do not eat  or drink anything at least six hours prior to the procedure.  Bring a driver with you .  It cannot be a taxi.  Come accompanied by an adult that can drive you back, and that is strong enough to help you if your legs get weak or numb from the local anesthetic.  Take all of your medicines the morning of the procedure with just enough water to swallow them.  If   you have diabetes, make sure that you are scheduled to have your procedure done first thing in the morning, whenever possible.  If you have diabetes, take only half of your insulin dose and notify our nurse that you have done so as soon as you arrive at the clinic.  If you are diabetic, but only take blood sugar pills (oral hypoglycemic), then do not take them on the morning of your procedure.  You may take them after you have had the procedure.  Do not take aspirin or any aspirin-containing medications, at least eleven (11) days prior to the procedure.  They may prolong bleeding.  Wear loose fitting clothing that may be easy to take off and that you would not mind if it got stained with Betadine or blood.  Do not wear any jewelry or perfume  Remove any nail coloring.  It will interfere with some of our monitoring equipment.  NOTE: Remember that this is not meant to be interpreted as a complete list of all possible complications.  Unforeseen problems may occur.  BLOOD THINNERS The following drugs contain aspirin or other products, which can cause increased bleeding during surgery and should not be taken for 2 weeks prior to and 1 week after surgery.  If you should need take something for relief of minor pain, you may take acetaminophen which is found in Tylenol,m Datril, Anacin-3 and Panadol. It is not blood thinner. The products listed below are.  Do not take any of the products listed below in addition to any listed on your instruction sheet.  A.P.C or A.P.C with Codeine Codeine Phosphate Capsules #3 Ibuprofen Ridaura  ABC compound  Congesprin Imuran rimadil  Advil Cope Indocin Robaxisal  Alka-Seltzer Effervescent Pain Reliever and Antacid Coricidin or Coricidin-D  Indomethacin Rufen  Alka-Seltzer plus Cold Medicine Cosprin Ketoprofen S-A-C Tablets  Anacin Analgesic Tablets or Capsules Coumadin Korlgesic Salflex  Anacin Extra Strength Analgesic tablets or capsules CP-2 Tablets Lanoril Salicylate  Anaprox Cuprimine Capsules Levenox Salocol  Anexsia-D Dalteparin Magan Salsalate  Anodynos Darvon compound Magnesium Salicylate Sine-off  Ansaid Dasin Capsules Magsal Sodium Salicylate  Anturane Depen Capsules Marnal Soma  APF Arthritis pain formula Dewitt's Pills Measurin Stanback  Argesic Dia-Gesic Meclofenamic Sulfinpyrazone  Arthritis Bayer Timed Release Aspirin Diclofenac Meclomen Sulindac  Arthritis pain formula Anacin Dicumarol Medipren Supac  Analgesic (Safety coated) Arthralgen Diffunasal Mefanamic Suprofen  Arthritis Strength Bufferin Dihydrocodeine Mepro Compound Suprol  Arthropan liquid Dopirydamole Methcarbomol with Aspirin Synalgos  ASA tablets/Enseals Disalcid Micrainin Tagament  Ascriptin Doan's Midol Talwin  Ascriptin A/D Dolene Mobidin Tanderil  Ascriptin Extra Strength Dolobid Moblgesic Ticlid  Ascriptin with Codeine Doloprin or Doloprin with Codeine Momentum Tolectin  Asperbuf Duoprin Mono-gesic Trendar  Aspergum Duradyne Motrin or Motrin IB Triminicin  Aspirin plain, buffered or enteric coated Durasal Myochrisine Trigesic  Aspirin Suppositories Easprin Nalfon Trillsate  Aspirin with Codeine Ecotrin Regular or Extra Strength Naprosyn Uracel  Atromid-S Efficin Naproxen Ursinus  Auranofin Capsules Elmiron Neocylate Vanquish  Axotal Emagrin Norgesic Verin  Azathioprine Empirin or Empirin with Codeine Normiflo Vitamin E  Azolid Emprazil Nuprin Voltaren  Bayer Aspirin plain, buffered or children's or timed BC Tablets or powders Encaprin Orgaran Warfarin Sodium  Buff-a-Comp Enoxaparin Orudis Zorpin   Buff-a-Comp with Codeine Equegesic Os-Cal-Gesic   Buffaprin Excedrin plain, buffered or Extra Strength Oxalid   Bufferin Arthritis Strength Feldene Oxphenbutazone   Bufferin plain or Extra Strength Feldene Capsules Oxycodone with Aspirin   Bufferin with Codeine Fenoprofen Fenoprofen Pabalate or Pabalate-SF   Buffets II Flogesic   Panagesic   Buffinol plain or Extra Strength Florinal or Florinal with Codeine Panwarfarin   Buf-Tabs Flurbiprofen Penicillamine   Butalbital Compound Four-way cold tablets Penicillin   Butazolidin Fragmin Pepto-Bismol   Carbenicillin Geminisyn Percodan   Carna Arthritis Reliever Geopen Persantine   Carprofen Gold's salt Persistin   Chloramphenicol Goody's Phenylbutazone   Chloromycetin Haltrain Piroxlcam   Clmetidine heparin Plaquenil   Cllnoril Hyco-pap Ponstel   Clofibrate Hydroxy chloroquine Propoxyphen         Before stopping any of these medications, be sure to consult the physician who ordered them.  Some, such as Coumadin (Warfarin) are ordered to prevent or treat serious conditions such as "deep thrombosis", "pumonary embolisms", and other heart problems.  The amount of time that you may need off of the medication may also vary with the medication and the reason for which you were taking it.  If you are taking any of these medications, please make sure you notify your pain physician before you undergo any procedures.          

## 2017-03-28 NOTE — Telephone Encounter (Signed)
[name] was here last week and Dr. Laban EmperorNaveira ordered a series of 5 hyalgan knee injections. [place] [facility] with [place] [facility] and she gave to ok to schedule. I called Billy Coasticole [place] today to double check on the authorization and she looked into it and said it has not been authorized due to the fact that Dr. Joannie Springsosenburg wants to speak to Dr. Laban EmperorNaveira regarding this order. Until she speaks to him it will not be authorized.  Please have Dr. Laban EmperorNaveira to call Dr. Joannie Springsosen[place] as soon as possible. Her number is [phone]. Thank you.  Unfortunately [name] will be unable to have his knee injection today.

## 2017-03-28 NOTE — Progress Notes (Signed)
Patient's Name: James Friedman  MRN: [id number]  Referring Provider: Bobbye Morton, MD  DOB: [birth year]  PCP: Bobbye Morton, MD  DOS: 03/28/2017  Note by: Sydnee Levans. Laban Emperor, MD  Service setting: Ambulatory outpatient  Specialty: Interventional Pain Management  Patient type: Established  Location: [facility] (AMB) Pain Management Facility  Visit type: Interventional Procedure   Primary Reason for Visit: Interventional Pain Management Treatment. CC: Knee Pain (both) and Back Pain (lower)  Procedure:  Anesthesia, Analgesia, Anxiolysis:  Type: Therapeutic Intra-Articular Hyalgan Knee Injection #1 Region: Lateral infrapatellar Knee Region Level: Knee Joint Laterality: Bilateral  Type: Local Anesthesia Local Anesthetic: Lidocaine 1% Route: Infiltration ([place]/IM) IV Access: Declined Sedation: Declined  Indication(s): Analgesia          Indications: 1. Osteoarthritis of knee (Bilateral)   2. Chronic knee pain (Bilateral)   3. Primary osteoarthritis of left knee   4. Primary osteoarthritis of right knee    Pain Score: Pre-procedure: 7 /10 Post-procedure: 0-No pain/10  Pre-op Assessment:  Previous date of service: 03/22/17 Service provided: Evaluation (post procedural eval) James Friedman is a 71 y.o. (year old), male patient, seen today for interventional treatment. He  has a past surgical history that includes Knee surgery (Bilateral); Neck surgery; Carpal tunnel release; Shoulder surgery (Right); Cataract extraction, extracapsular (Left, 08/04/2015); and Cataract extraction w/PHACO (Right, 04/19/2016). James Friedman has a current medication list which includes the following prescription(s): acetaminophen, albuterol, aluminum hydroxide, amlodipine, antiseptic oral rinse, aspirin, atorvastatin, azelastine, capsaicin, carboxymethylcellul-glycerin, cetirizine, chlorpheniramine, clotrimazole, diclofenac sodium, docusate sodium, duloxetine, ergocalciferol, fluticasone, gabapentin, glucagon, hydroxyzine,  insulin aspart, insulin detemir, lidocaine, lisinopril, magnesium, metformin, methocarbamol, metoprolol tartrate, mometasone, morphine, morphine, morphine, multivitamin, naloxone, naproxen, fish oil, omeprazole, oxycodone, oxycodone, oxycodone, oxygen-helium, pantoprazole, polyethylene glycol powder, promethazine, senna, sitagliptin-metformin, testosterone cypionate, trazodone, vitamin c, and vitamin a & d. His primarily concern today is the Knee Pain (both) and Back Pain (lower)  Initial Vital Signs: Blood pressure 121/63, pulse 83, temperature 98.5 F (36.9 C), resp. rate 16, height 5\' 9"  (1.753 m), weight 282 lb (127.9 kg), SpO2 98 %. BMI: 41.64 kg/m  Risk Assessment: Allergies: Reviewed. He has No Known Allergies.  Allergy Precautions: None required Coagulopathies: Reviewed. None identified.  Blood-thinner therapy: None at this time Active Infection(s): Reviewed. None identified. James Friedman is afebrile  Site Confirmation: James Friedman was asked to confirm the procedure and laterality before marking the site Procedure checklist: Completed Consent: Before the procedure and under the influence of no sedative(s), amnesic(s), or anxiolytics, the patient was informed of the treatment options, risks and possible complications. To fulfill our ethical and legal obligations, as recommended by the American Medical Association's Code of Ethics, I have informed the patient of my clinical impression; the nature and purpose of the treatment or procedure; the risks, benefits, and possible complications of the intervention; the alternatives, including doing nothing; the risk(s) and benefit(s) of the alternative treatment(s) or procedure(s); and the risk(s) and benefit(s) of doing nothing. The patient was provided information about the general risks and possible complications associated with the procedure. These may include, but are not limited to: failure to achieve desired goals, infection, bleeding, organ or  nerve damage, allergic reactions, paralysis, and death. In addition, the patient was informed of those risks and complications associated to the procedure, such as failure to decrease pain; infection; bleeding; organ or nerve damage with subsequent damage to sensory, motor, and/or autonomic systems, resulting in permanent pain, numbness, and/or weakness of one or several areas of the body; allergic reactions; (  i.e.: anaphylactic reaction); and/or death. Furthermore, the patient was informed of those risks and complications associated with the medications. These include, but are not limited to: allergic reactions (i.e.: anaphylactic or anaphylactoid reaction(s)); adrenal axis suppression; blood sugar elevation that in diabetics may result in ketoacidosis or comma; water retention that in patients with history of congestive heart failure may result in shortness of breath, pulmonary edema, and decompensation with resultant heart failure; weight gain; swelling or edema; medication-induced neural toxicity; particulate matter embolism and blood vessel occlusion with resultant organ, and/or nervous system infarction; and/or aseptic necrosis of one or more joints. Finally, the patient was informed that Medicine is not an exact science; therefore, there is also the possibility of unforeseen or unpredictable risks and/or possible complications that may result in a catastrophic outcome. The patient indicated having understood very clearly. We have given the patient no guarantees and we have made no promises. Enough time was given to the patient to ask questions, all of which were answered to the patient's satisfaction. James Friedman has indicated that he wanted to continue with the procedure. Attestation: I, the ordering provider, attest that I have discussed with the patient the benefits, risks, side-effects, alternatives, likelihood of achieving goals, and potential problems during recovery for the procedure that I have  provided informed consent. Date: 03/28/2017; Time: 10:34 AM  Pre-Procedure Preparation:  Monitoring: As per clinic protocol. Respiration, ETCO2, SpO2, BP, heart rate and rhythm monitor placed and checked for adequate function Safety Precautions: Patient was assessed for positional comfort and pressure points before starting the procedure. Time-out: I initiated and conducted the "Time-out" before starting the procedure, as per protocol. The patient was asked to participate by confirming the accuracy of the "Time Out" information. Verification of the correct person, site, and procedure were performed and confirmed by me, the nursing staff, and the patient. "Time-out" conducted as per Joint Commission's Universal Protocol (UP.01.01.01). "Time-out" Date & Time: 03/28/2017; 1108 hrs.  Description of Procedure Process:   Position: Sitting Target Area: Knee Joint Approach: Just above the Lateral tibial plateau, lateral to the infrapatellar tendon. Area Prepped: Entire knee area, from the mid-thigh to the mid-shin. Prepping solution: ChloraPrep (2% chlorhexidine gluconate and 70% isopropyl alcohol) Safety Precautions: Aspiration looking for blood return was conducted prior to all injections. At no point did we inject any substances, as a needle was being advanced. No attempts were made at seeking any paresthesias. Safe injection practices and needle disposal techniques used. Medications properly checked for expiration dates. SDV (single dose vial) medications used. Description of the Procedure: Protocol guidelines were followed. The patient was placed in position over the fluoroscopy table. The target area was identified and the area prepped in the usual manner. Skin desensitized using vapocoolant spray. Skin & deeper tissues infiltrated with local anesthetic. Appropriate amount of time allowed to pass for local anesthetics to take effect. The procedure needles were then advanced to the target area. Proper  needle placement secured. Negative aspiration confirmed. Solution injected in intermittent fashion, asking for systemic symptoms every 0.5cc of injectate. The needles were then removed and the area cleansed, making sure to leave some of the prepping solution back to take advantage of its long term bactericidal properties. Vitals:   03/28/17 1004 03/28/17 1116 03/28/17 1120  BP: 121/63 129/89 119/77  Pulse: 83 77 76  Resp: 16 16 16   Temp: 98.5 F (36.9 C)    SpO2: 98% 94% 94%  Weight: 282 lb (127.9 kg)    Height: 5\' 9"  (1.753  m)      Start Time: 1111 hrs. End Time: 1115 hrs. Materials:  Needle(s) Type: Regular needle Gauge: 22G Length: 3.5-in Medication(s): We administered lidocaine (PF), Sodium Hyaluronate, Sodium Hyaluronate, and ropivacaine (PF) 2 mg/mL (0.2%). Please see chart orders for dosing details.  Imaging Guidance:  Type of Imaging Technique: None used Indication(s): N/A Exposure Time: No patient exposure Contrast: None used. Fluoroscopic Guidance: N/A Ultrasound Guidance: N/A Interpretation: N/A  Antibiotic Prophylaxis:  Indication(s): None identified Antibiotic given: None  Post-operative Assessment:  EBL: None Complications: No immediate post-treatment complications observed by team, or reported by patient. Note: The patient tolerated the entire procedure well. A repeat set of vitals were taken after the procedure and the patient was kept under observation following institutional policy, for this type of procedure. Post-procedural neurological assessment was performed, showing return to baseline, prior to discharge. The patient was provided with post-procedure discharge instructions, including a section on how to identify potential problems. Should any problems arise concerning this procedure, the patient was given instructions to immediately contact us, at any time, without hesitation. In any case, we plan to contact the patient by telephone for a follow-up status  report regarding this interventional procedure. Comments:  No additional relevant information.  Plan of Care  Disposition: Discharge home  Discharge Date & Time: 03/28/2017; 1122 hrs.  Physician-requested Follow-up:  Return for post-procedure eval (in 2 wks).  Future Appointments Date Time Provider Department Center  04/21/2017 9:00 AM Delano Metz, MD [facility] None  06/02/2017 9:30 AM Barbette Merino, NP [facility] None   Medications ordered for procedure: Meds ordered this encounter  Medications  . lidocaine (PF) (XYLOCAINE) 1 % injection 10 mL  . Sodium Hyaluronate SOSY 2 mL  . Sodium Hyaluronate SOSY 2 mL  . ropivacaine (PF) 2 mg/mL (0.2%) (NAROPIN) injection 4 mL   Medications administered: We administered lidocaine (PF), Sodium Hyaluronate, Sodium Hyaluronate, and ropivacaine (PF) 2 mg/mL (0.2%).  See the medical record for exact dosing, route, and time of administration.  Lab-work, Procedure(s), & Referral(s) Ordered: Orders Placed This Encounter  Procedures  . KNEE INJECTION  . Informed Consent Details: Transcribe to consent form and obtain patient signature  . Provider attestation of informed consent for procedure/surgical case  . Verify informed consent  . Discharge instructions  . Follow-up   Imaging Ordered: Results for orders placed in visit on 02/16/17  DG C-Arm 1-60 Min-No Report   Narrative Fluoroscopy was utilized by the requesting physician.  No radiographic  interpretation.    New Prescriptions   No medications on file   Primary Care Physician: Bobbye Morton, MD Location: [facility] Note by: Sydnee Levans. Laban Emperor, M.D, DABA, DABAPM, DABPM, DABIPP, FIPP Date: 03/28/2017; Time: 2:08 PM  Disclaimer:  Medicine is not an Visual merchandiser. The only guarantee in medicine is that nothing is guaranteed. It is important to note that the decision to proceed with this intervention was based on the information collected from the  patient. The Data and conclusions were drawn from the patient's questionnaire, the interview, and the physical examination. Because the information was provided in large part by the patient, it cannot be guaranteed that it has not been purposely or unconsciously manipulated. Every effort has been made to obtain as much relevant data as possible for this evaluation. It is important to note that the conclusions that lead to this procedure are derived in large part from the available data. Always take into account that the treatment will also be  dependent on availability of resources and existing treatment guidelines, considered by other Pain Management Practitioners as being common knowledge and practice, at the time of the intervention. For Medico-Legal purposes, it is also important to point out that variation in procedural techniques and pharmacological choices are the acceptable norm. The indications, contraindications, technique, and results of the above procedure should only be interpreted and judged by a Board-Certified Interventional Pain Specialist with extensive familiarity and expertise in the same exact procedure and technique.  Instructions provided at this appointment: Patient Instructions   ____________________________________________________________________________________________  Post-Procedure instructions Instructions:  Apply ice: Fill a plastic sandwich bag with crushed ice. Cover it with a small towel and apply to injection site. Apply for 15 minutes then remove x 15 minutes. Repeat sequence on day of procedure, until you go to bed. The purpose is to minimize swelling and discomfort after procedure.  Apply heat: Apply heat to procedure site starting the day following the procedure. The purpose is to treat any soreness and discomfort from the procedure.  Food intake: Start with clear liquids (like water) and advance to regular food, as tolerated.   Physical activities: Keep  activities to a minimum for the first 8 hours after the procedure.   Driving: If you have received any sedation, you are not allowed to drive for 24 hours after your procedure.  Blood thinner: Restart your blood thinner 6 hours after your procedure. (Only for those taking blood thinners)  Insulin: As soon as you can eat, you may resume your normal dosing schedule. (Only for those taking insulin)  Infection prevention: Keep procedure site clean and dry.  Post-procedure Pain Diary: Extremely important that this be done correctly and accurately. Recorded information will be used to determine the next step in treatment.  Pain evaluated is that of treated area only. Do not include pain from an untreated area.  Complete every hour, on the hour, for the initial 8 hours. Set an alarm to help you do this part accurately.  Do not go to sleep and have it completed later. It will not be accurate.  Follow-up appointment: Keep your follow-up appointment after the procedure. Usually 2 weeks for most procedures. (6 weeks in the case of radiofrequency.) Bring you pain diary.  Expect:  From numbing medicine (AKA: Local Anesthetics): Numbness or decrease in pain.  Onset: Full effect within 15 minutes of injected.  Duration: It will depend on the type of local anesthetic used. On the average, 1 to 8 hours.   From steroids: Decrease in swelling or inflammation. Once inflammation is improved, relief of the pain will follow.  Onset of benefits: Depends on the amount of swelling present. The more swelling, the longer it will take for the benefits to be seen. In some cases, up to 10 days.  Duration: Steroids will stay in the system x 2 weeks. Duration of benefits will depend on multiple posibilities including persistent irritating factors.  From procedure: Some discomfort is to be expected once the numbing medicine wears off. This should be minimal if ice and heat are applied as instructed. Call if:  You  experience numbness and weakness that gets worse with time, as opposed to wearing off.  New onset bowel or bladder incontinence. (Spinal procedures only)  Emergency Numbers:  Durning business hours (Monday - Thursday, 8:00 AM - 4:00 PM) (Friday, 9:00 AM - 12:00 Noon): [phone]  After hours: [phone] ____________________________________________________________________________________________  ____________________________________________________________________________________________  Preparing for your procedure (without sedation) Instructions: . Oral Intake: Do  not eat or drink anything for at least 3 hours prior to your procedure. . Transportation: Unless otherwise stated by your physician, you may drive yourself after the procedure. . Blood Pressure Medicine: Take your blood pressure medicine with a sip of water the morning of the procedure. . Blood thinners:  . Diabetics on insulin: Notify the staff so that you can be scheduled 1st case in the morning. If your diabetes requires high dose insulin, take only  of your normal insulin dose the morning of the procedure and notify the staff that you have done so. . Preventing infections: Shower with an antibacterial soap the morning of your procedure.  . Build-up your immune system: Take 1000 mg of Vitamin C with every meal (3 times a day) the day prior to your procedure. Marland Kitchen. Antibiotics: Inform the staff if you have a condition or reason that requires you to take antibiotics before dental procedures. . Pregnancy: If you are pregnant, call and cancel the procedure. . Sickness: If you have a cold, fever, or any active infections, call and cancel the procedure. . Arrival: You must be in the facility at least 30 minutes prior to your scheduled procedure. . Children: Do not bring any children with you. . Dress appropriately: Bring dark clothing that you would not mind if they get stained. . Valuables: Do not bring any jewelry or  valuables. Procedure appointments are reserved for interventional treatments only. Marland Kitchen. No Prescription Refills. . No medication changes will be discussed during procedure appointments. . No disability issues will be discussed. ____________________________________________________________________________________________  Pain Management Discharge Instructions  General Discharge Instructions :  If you need to reach your doctor call: Monday-Friday 8:00 am - 4:00 pm at [phone] or toll free [phone].  After clinic hours [phone] to have operator reach doctor.  Bring all of your medication bottles to all your appointments in the pain clinic.  To cancel or reschedule your appointment with Pain Management please remember to call 24 hours in advance to avoid a fee.  Refer to the educational materials which you have been given on: General Risks, I had my Procedure. Discharge Instructions, Post Sedation.  Post Procedure Instructions:  The drugs you were given will stay in your system until tomorrow, so for the next 24 hours you should not drive, make any legal decisions or drink any alcoholic beverages.  You may eat anything you prefer, but it is better to start with liquids then soups and crackers, and gradually work up to solid foods.  Please notify your doctor immediately if you have any unusual bleeding, trouble breathing or pain that is not related to your normal pain.  Depending on the type of procedure that was done, some parts of your body may feel week and/or numb.  This usually clears up by tonight or the next day.  Walk with the use of an assistive device or accompanied by an adult for the 24 hours.  You may use ice on the affected area for the first 24 hours.  Put ice in a Ziploc bag and cover with a towel and place against area 15 minutes on 15 minutes off.  You may switch to heat after 24 hours. Knee Injection A knee injection is a procedure to get medicine into your  knee joint. Your health care provider puts a needle into the joint and injects medicine with an attached syringe. The injected medicine may relieve the pain, swelling, and stiffness of arthritis. The injected medicine may also help to lubricate  and cushion your knee joint. You may need more than one injection. Tell a health care provider about:  Any allergies you have.  All medicines you are taking, including vitamins, herbs, eye drops, creams, and over-the-counter medicines.  Any problems you or family members have had with anesthetic medicines.  Any blood disorders you have.  Any surgeries you have had.  Any medical conditions you have. What are the risks? Generally, this is a safe procedure. However, problems may occur, including:  Infection.  Bleeding.  Worsening symptoms.  Damage to the area around your knee.  Allergic reaction to any of the medicines.  Skin reactions from repeated injections.  What happens before the procedure?  Ask your health care provider about changing or stopping your regular medicines. This is especially important if you are taking diabetes medicines or blood thinners.  Plan to have someone take you home after the procedure. What happens during the procedure?  You will sit or lie down in a position for your knee to be treated.  The skin over your kneecap will be cleaned with a germ-killing solution (antiseptic).  You will be given a medicine that numbs the area (local anesthetic). You may feel some stinging.  After your knee becomes numb, you will have a second injection. This is the medicine. This needle is carefully placed between your kneecap and your knee. The medicine is injected into the joint space.  At the end of the procedure, the needle will be removed.  A bandage (dressing) may be placed over the injection site. The procedure may vary among health care providers and hospitals. What happens after the procedure?  You may have to  move your knee through its full range of motion. This helps to get all of the medicine into your joint space.  Your blood pressure, heart rate, breathing rate, and blood oxygen level will be monitored often until the medicines you were given have worn off.  You will be watched to make sure that you do not have a reaction to the injected medicine. This information is not intended to replace advice given to you by your health care provider. Make sure you discuss any questions you have with your health care provider. Document Released: 11/28/2006 Document Revised: 02/06/2016 Document Reviewed: 07/17/2014 Elsevier Interactive Patient Education  2018 [employer] GENERAL RISKS AND COMPLICATIONS  What are the risk, side effects and possible complications? Generally speaking, most procedures are safe.  However, with any procedure there are risks, side effects, and the possibility of complications.  The risks and complications are dependent upon the sites that are lesioned, or the type of nerve block to be performed.  The closer the procedure is to the spine, the more serious the risks are.  Great care is taken when placing the radio frequency needles, block needles or lesioning probes, but sometimes complications can occur. 1. Infection: Any time there is an injection through the skin, there is a risk of infection.  This is why sterile conditions are used for these blocks.  There are four possible types of infection. 1. Localized skin infection. 2. Central Nervous System Infection-This can be in the form of Meningitis, which can be deadly. 3. Epidural Infections-This can be in the form of an epidural abscess, which can cause pressure inside of the spine, causing compression of the spinal cord with subsequent paralysis. This would require an emergency surgery to decompress, and there are no guarantees that the patient would recover from the paralysis. 4. Discitis-This is  an infection of the intervertebral  discs.  It occurs in about 1% of discography procedures.  It is difficult to treat and it may lead to surgery.        2. Pain: the needles have to go through skin and soft tissues, will cause soreness.       3. Damage to internal structures:  The nerves to be lesioned may be near blood vessels or    other nerves which can be potentially damaged.       4. Bleeding: Bleeding is more common if the patient is taking blood thinners such as  aspirin, Coumadin, Ticiid, Plavix, etc., or if he/she have some genetic predisposition  such as hemophilia. Bleeding into the spinal canal can cause compression of the spinal  cord with subsequent paralysis.  This would require an emergency surgery to  decompress and there are no guarantees that the patient would recover from the  paralysis.       5. Pneumothorax:  Puncturing of a lung is a possibility, every time a needle is introduced in  the area of the chest or upper back.  Pneumothorax refers to free air around the  collapsed lung(s), inside of the thoracic cavity (chest cavity).  Another two possible  complications related to a similar event would include: Hemothorax and Chylothorax.   These are variations of the Pneumothorax, where instead of air around the collapsed  lung(s), you may have blood or chyle, respectively.       6. Spinal headaches: They may occur with any procedures in the area of the spine.       7. Persistent CSF (Cerebro-Spinal Fluid) leakage: This is a rare problem, but may occur  with prolonged intrathecal or epidural catheters either due to the formation of a fistulous  track or a dural tear.       8. Nerve damage: By working so close to the spinal cord, there is always a possibility of  nerve damage, which could be as serious as a permanent spinal cord injury with  paralysis.       9. Death:  Although rare, severe deadly allergic reactions known as "Anaphylactic  reaction" can occur to any of the medications used.      10. Worsening of the  symptoms:  We can always make thing worse.  What are the chances of something like this happening? Chances of any of this occuring are extremely low.  By statistics, you have more of a chance of getting killed in a motor vehicle accident: while driving to the hospital than any of the above occurring .  Nevertheless, you should be aware that they are possibilities.  In general, it is similar to taking a shower.  Everybody knows that you can slip, hit your head and get killed.  Does that mean that you should not shower again?  Nevertheless always keep in mind that statistics do not mean anything if you happen to be on the wrong side of them.  Even if a procedure has a 1 (one) in a 1,000,000 (million) chance of going wrong, it you happen to be that one..Also, keep in mind that by statistics, you have more of a chance of having something go wrong when taking medications.  Who should not have this procedure? If you are on a blood thinning medication (e.g. Coumadin, Plavix, see list of "Blood Thinners"), or if you have an active infection going on, you should not have the procedure.  If you are taking  any blood thinners, please inform your physician.  How should I prepare for this procedure?  Do not eat or drink anything at least six hours prior to the procedure.  Bring a driver with you .  It cannot be a taxi.  Come accompanied by an adult that can drive you back, and that is strong enough to help you if your legs get weak or numb from the local anesthetic.  Take all of your medicines the morning of the procedure with just enough water to swallow them.  If you have diabetes, make sure that you are scheduled to have your procedure done first thing in the morning, whenever possible.  If you have diabetes, take only half of your insulin dose and notify our nurse that you have done so as soon as you arrive at the clinic.  If you are diabetic, but only take blood sugar pills (oral hypoglycemic), then do  not take them on the morning of your procedure.  You may take them after you have had the procedure.  Do not take aspirin or any aspirin-containing medications, at least eleven (11) days prior to the procedure.  They may prolong bleeding.  Wear loose fitting clothing that may be easy to take off and that you would not mind if it got stained with Betadine or blood.  Do not wear any jewelry or perfume  Remove any nail coloring.  It will interfere with some of our monitoring equipment.  NOTE: Remember that this is not meant to be interpreted as a complete list of all possible complications.  Unforeseen problems may occur.  BLOOD THINNERS The following drugs contain aspirin or other products, which can cause increased bleeding during surgery and should not be taken for 2 weeks prior to and 1 week after surgery.  If you should need take something for relief of minor pain, you may take acetaminophen which is found in Tylenol,m Datril, Anacin-3 and Panadol. It is not blood thinner. The products listed below are.  Do not take any of the products listed below in addition to any listed on your instruction sheet.  A.P.C or A.P.C with Codeine Codeine Phosphate Capsules #3 Ibuprofen Ridaura  ABC compound Congesprin Imuran rimadil  Advil Cope Indocin Robaxisal  Alka-Seltzer Effervescent Pain Reliever and Antacid Coricidin or Coricidin-D  Indomethacin Rufen  Alka-Seltzer plus Cold Medicine Cosprin Ketoprofen S-A-C Tablets  Anacin Analgesic Tablets or Capsules Coumadin Korlgesic Salflex  Anacin Extra Strength Analgesic tablets or capsules CP-2 Tablets Lanoril Salicylate  Anaprox Cuprimine Capsules Levenox Salocol  Anexsia-D Dalteparin Magan Salsalate  Anodynos Darvon compound Magnesium Salicylate Sine-off  Ansaid Dasin Capsules Magsal Sodium Salicylate  Anturane Depen Capsules Marnal Soma  APF Arthritis pain formula Dewitt's Pills Measurin Stanback  Argesic Dia-Gesic Meclofenamic Sulfinpyrazone    Arthritis Bayer Timed Release Aspirin Diclofenac Meclomen Sulindac  Arthritis pain formula Anacin Dicumarol Medipren Supac  Analgesic (Safety coated) Arthralgen Diffunasal Mefanamic Suprofen  Arthritis Strength Bufferin Dihydrocodeine Mepro Compound Suprol  Arthropan liquid Dopirydamole Methcarbomol with Aspirin Synalgos  ASA tablets/Enseals Disalcid Micrainin Tagament  Ascriptin Doan's Midol Talwin  Ascriptin A/D Dolene Mobidin Tanderil  Ascriptin Extra Strength Dolobid Moblgesic Ticlid  Ascriptin with Codeine Doloprin or Doloprin with Codeine Momentum Tolectin  Asperbuf Duoprin Mono-gesic Trendar  Aspergum Duradyne Motrin or Motrin IB Triminicin  Aspirin plain, buffered or enteric coated Durasal Myochrisine Trigesic  Aspirin Suppositories Easprin Nalfon Trillsate  Aspirin with Codeine Ecotrin Regular or Extra Strength Naprosyn Uracel  Atromid-S Efficin Naproxen Ursinus  Auranofin Capsules Elmiron  Neocylate Vanquish  Axotal Emagrin Norgesic Verin  Azathioprine Empirin or Empirin with Codeine Normiflo Vitamin E  Azolid Emprazil Nuprin Voltaren  Bayer Aspirin plain, buffered or children's or timed BC Tablets or powders Encaprin Orgaran Warfarin Sodium  Buff-a-Comp Enoxaparin Orudis Zorpin  Buff-a-Comp with Codeine Equegesic Os-Cal-Gesic   Buffaprin Excedrin plain, buffered or Extra Strength Oxalid   Bufferin Arthritis Strength Feldene Oxphenbutazone   Bufferin plain or Extra Strength Feldene Capsules Oxycodone with Aspirin   Bufferin with Codeine Fenoprofen Fenoprofen Pabalate or Pabalate-SF   Buffets II Flogesic Panagesic   Buffinol plain or Extra Strength Florinal or Florinal with Codeine Panwarfarin   Buf-Tabs Flurbiprofen Penicillamine   Butalbital Compound Four-way cold tablets Penicillin   Butazolidin Fragmin Pepto-Bismol   Carbenicillin Geminisyn Percodan   Carna Arthritis Reliever Geopen Persantine   Carprofen Gold's salt Persistin   Chloramphenicol Goody's Phenylbutazone    Chloromycetin Haltrain Piroxlcam   Clmetidine heparin Plaquenil   Cllnoril Hyco-pap Ponstel   Clofibrate Hydroxy chloroquine Propoxyphen         Before stopping any of these medications, be sure to consult the physician who ordered them.  Some, such as Coumadin (Warfarin) are ordered to prevent or treat serious conditions such as "deep thrombosis", "pumonary embolisms", and other heart problems.  The amount of time that you may need off of the medication may also vary with the medication and the reason for which you were taking it.  If you are taking any of these medications, please make sure you notify your pain physician before you undergo any procedures.

## 2017-03-28 NOTE — Progress Notes (Signed)
Safety precautions to be maintained throughout the outpatient stay will include: orient to surroundings, keep bed in low position, maintain call bell within reach at all times, provide assistance with transfer out of bed and ambulation.  

## 2017-03-29 ENCOUNTER — Telehealth: Payer: Self-pay

## 2017-03-29 NOTE — Telephone Encounter (Signed)
Post procedure phone call.  Did not leave message.  Voicemail did not have a name attached to it.

## 2017-04-02 LAB — 25-HYDROXY VITAMIN D LCMS D2+D3
25-Hydroxy, Vitamin D-3: 21 ng/mL
25-Hydroxy, Vitamin D: 35 ng/mL

## 2017-04-02 LAB — COMPREHENSIVE METABOLIC PANEL
A/G RATIO: 2.5 — AB (ref 1.2–2.2)
ALBUMIN: 4.3 g/dL (ref 3.6–4.8)
ALT: 23 IU/L (ref 0–44)
AST: 19 IU/L (ref 0–40)
Alkaline Phosphatase: 70 IU/L (ref 39–117)
BUN / CREAT RATIO: 24 (ref 10–24)
BUN: 19 mg/dL (ref 8–27)
Bilirubin Total: 0.2 mg/dL (ref 0.0–1.2)
CALCIUM: 9.1 mg/dL (ref 8.6–10.2)
CO2: 22 mmol/L (ref 20–29)
CREATININE: 0.79 mg/dL (ref 0.76–1.27)
Chloride: 91 mmol/L — ABNORMAL LOW (ref 96–106)
GFR, EST AFRICAN AMERICAN: 107 mL/min/{1.73_m2} (ref >59)
GFR, EST NON AFRICAN AMERICAN: 92 mL/min/{1.73_m2} (ref >59)
GLOBULIN, TOTAL: 1.7 g/dL (ref 1.5–4.5)
Glucose: 241 mg/dL — ABNORMAL HIGH (ref 65–99)
POTASSIUM: 5.1 mmol/L (ref 3.5–5.2)
SODIUM: 136 mmol/L (ref 134–144)
TOTAL PROTEIN: 6 g/dL (ref 6.0–8.5)

## 2017-04-02 LAB — MAGNESIUM: MAGNESIUM: 1.8 mg/dL (ref 1.6–2.3)

## 2017-04-02 LAB — VITAMIN B12: VITAMIN B 12: 255 pg/mL (ref 232–1245)

## 2017-04-02 LAB — C-REACTIVE PROTEIN: CRP: 1.3 mg/L (ref 0.0–4.9)

## 2017-04-02 LAB — SEDIMENTATION RATE: Sed Rate: 3 mm/hr (ref 0–30)

## 2017-04-02 LAB — 25-HYDROXYVITAMIN D LCMS D2+D3: 25-HYDROXY, VITAMIN D-2: 14 ng/mL

## 2017-04-11 ENCOUNTER — Encounter: Payer: Self-pay | Admitting: Pain Medicine

## 2017-04-11 ENCOUNTER — Ambulatory Visit: Payer: Medicare (Managed Care) | Attending: Pain Medicine | Admitting: Pain Medicine

## 2017-04-11 VITALS — BP 121/68 | HR 89 | Temp 98.0°F | Resp 16 | Ht 69.0 in | Wt 280.0 lb

## 2017-04-11 DIAGNOSIS — M25562 Pain in left knee: Secondary | ICD-10-CM | POA: Diagnosis not present

## 2017-04-11 DIAGNOSIS — Z6841 Body Mass Index (BMI) 40.0 and over, adult: Secondary | ICD-10-CM | POA: Diagnosis not present

## 2017-04-11 DIAGNOSIS — Z9841 Cataract extraction status, right eye: Secondary | ICD-10-CM | POA: Insufficient documentation

## 2017-04-11 DIAGNOSIS — M545 Low back pain: Secondary | ICD-10-CM | POA: Diagnosis not present

## 2017-04-11 DIAGNOSIS — G8929 Other chronic pain: Secondary | ICD-10-CM | POA: Diagnosis not present

## 2017-04-11 DIAGNOSIS — M170 Bilateral primary osteoarthritis of knee: Secondary | ICD-10-CM | POA: Insufficient documentation

## 2017-04-11 DIAGNOSIS — M25561 Pain in right knee: Secondary | ICD-10-CM | POA: Insufficient documentation

## 2017-04-11 DIAGNOSIS — M1712 Unilateral primary osteoarthritis, left knee: Secondary | ICD-10-CM

## 2017-04-11 DIAGNOSIS — M1711 Unilateral primary osteoarthritis, right knee: Secondary | ICD-10-CM | POA: Diagnosis not present

## 2017-04-11 MED ORDER — SODIUM HYALURONATE (VISCOSUP) 20 MG/2ML IX SOSY
2.0000 mL | PREFILLED_SYRINGE | Freq: Once | INTRA_ARTICULAR | Status: AC
  Administered 2017-04-11: 09:00:00 via INTRA_ARTICULAR
  Filled 2017-04-11: qty 2

## 2017-04-11 MED ORDER — LIDOCAINE HCL (PF) 1 % IJ SOLN
5.0000 mL | Freq: Once | INTRAMUSCULAR | Status: AC
  Administered 2017-04-11: 5 mL
  Filled 2017-04-11: qty 5

## 2017-04-11 MED ORDER — ROPIVACAINE HCL 2 MG/ML IJ SOLN
4.0000 mL | Freq: Once | INTRAMUSCULAR | Status: AC
  Administered 2017-04-11: 10 mL via INTRA_ARTICULAR
  Filled 2017-04-11: qty 10

## 2017-04-11 NOTE — Patient Instructions (Signed)

## 2017-04-11 NOTE — Progress Notes (Signed)
Safety precautions to be maintained throughout the outpatient stay will include: orient to surroundings, keep bed in low position, maintain call bell within reach at all times, provide assistance with transfer out of bed and ambulation.  

## 2017-04-11 NOTE — Progress Notes (Addendum)
Patient's Name: James Friedman  MRN: [id number]  Referring Provider: Bobbye Morton, MD  DOB: 1945-12-21  PCP: Bobbye Morton, MD  DOS: 04/11/2017  Note by: Oswaldo Done, MD  Service setting: Ambulatory outpatient  Specialty: Interventional Pain Management  Patient type: Established  Location: [facility] (AMB) Pain Management Facility  Visit type: Interventional Procedure   Primary Reason for Visit: Interventional Pain Management Treatment. CC: Knee Pain (right) and Back Pain (lower)  Procedure:  Anesthesia, Analgesia, Anxiolysis:  Type: Therapeutic Intra-Articular Hyalgan Knee Injection #2 Region: Lateral infrapatellar Knee Region Level: Knee Joint Laterality: Bilateral  Type: Local Anesthesia Local Anesthetic: Lidocaine 1% Route: Infiltration (Cobb/IM) IV Access: Declined Sedation: Declined  Indication(s): Analgesia          Indications: 1. Osteoarthritis of knee (Bilateral)   2. Primary osteoarthritis of right knee   3. Primary osteoarthritis of left knee   4. Chronic knee pain (Bilateral)    Pain Score: Pre-procedure: 3 /10 Post-procedure: 3 /10  Pre-op Assessment:  Previous date of service: 03/28/17 Service provided: Procedure (bilateral knee injections) James Friedman is a 71 y.o. (year old), male patient, seen today for interventional treatment. He  has a past surgical history that includes Knee surgery (Bilateral); Neck surgery; Carpal tunnel release; Shoulder surgery (Right); Cataract extraction, extracapsular (Left, 08/04/2015); and Cataract extraction w/PHACO (Right, 04/19/2016). James Friedman has a current medication list which includes the following prescription(s): acetaminophen, albuterol, aluminum hydroxide, amlodipine, antiseptic oral rinse, aspirin, atorvastatin, azelastine, capsaicin, carboxymethylcellul-glycerin, cetirizine, chlorpheniramine, clotrimazole, diclofenac sodium, docusate sodium, duloxetine, ergocalciferol, fluticasone, gabapentin, glucagon, hydroxyzine,  insulin aspart, insulin detemir, lidocaine, lisinopril, magnesium, metformin, methocarbamol, metoprolol tartrate, mometasone, morphine, morphine, multivitamin, naloxone, naproxen, fish oil, omeprazole, oxycodone, oxycodone, oxygen-helium, pantoprazole, polyethylene glycol powder, promethazine, senna, sitagliptin-metformin, testosterone cypionate, trazodone, vitamin c, vitamin a & d, morphine, and oxycodone. His primarily concern today is the Knee Pain (right) and Back Pain (lower)  Initial Vital Signs: There were no vitals taken for this visit. BMI: Estimated body mass index is 41.35 kg/m as calculated from the following:   Height as of this encounter: 5\' 9"  (1.753 m).   Weight as of this encounter: 280 lb (127 kg).  Risk Assessment: Allergies: Reviewed. He has No Known Allergies.  Allergy Precautions: None required Coagulopathies: Reviewed. None identified.  Blood-thinner therapy: None at this time Active Infection(s): Reviewed. None identified. James Friedman is afebrile  Site Confirmation: James Friedman was asked to confirm the procedure and laterality before marking the site Procedure checklist: Completed Consent: Before the procedure and under the influence of no sedative(s), amnesic(s), or anxiolytics, the patient was informed of the treatment options, risks and possible complications. To fulfill our ethical and legal obligations, as recommended by the American Medical Association's Code of Ethics, I have informed the patient of my clinical impression; the nature and purpose of the treatment or procedure; the risks, benefits, and possible complications of the intervention; the alternatives, including doing nothing; the risk(s) and benefit(s) of the alternative treatment(s) or procedure(s); and the risk(s) and benefit(s) of doing nothing. The patient was provided information about the general risks and possible complications associated with the procedure. These may include, but are not limited to:  failure to achieve desired goals, infection, bleeding, organ or nerve damage, allergic reactions, paralysis, and death. In addition, the patient was informed of those risks and complications associated to the procedure, such as failure to decrease pain; infection; bleeding; organ or nerve damage with subsequent damage to sensory, motor, and/or autonomic systems, resulting in permanent  pain, numbness, and/or weakness of one or several areas of the body; allergic reactions; (i.e.: anaphylactic reaction); and/or death. Furthermore, the patient was informed of those risks and complications associated with the medications. These include, but are not limited to: allergic reactions (i.e.: anaphylactic or anaphylactoid reaction(s)); adrenal axis suppression; blood sugar elevation that in diabetics may result in ketoacidosis or comma; water retention that in patients with history of congestive heart failure may result in shortness of breath, pulmonary edema, and decompensation with resultant heart failure; weight gain; swelling or edema; medication-induced neural toxicity; particulate matter embolism and blood vessel occlusion with resultant organ, and/or nervous system infarction; and/or aseptic necrosis of one or more joints. Finally, the patient was informed that Medicine is not an exact science; therefore, there is also the possibility of unforeseen or unpredictable risks and/or possible complications that may result in a catastrophic outcome. The patient indicated having understood very clearly. We have given the patient no guarantees and we have made no promises. Enough time was given to the patient to ask questions, all of which were answered to the patient's satisfaction. James Friedman has indicated that he wanted to continue with the procedure. Attestation: I, the ordering provider, attest that I have discussed with the patient the benefits, risks, side-effects, alternatives, likelihood of achieving goals, and  potential problems during recovery for the procedure that I have provided informed consent. Date: 04/11/2017; Time: 7:48 AM  Pre-Procedure Preparation:  Monitoring: As per clinic protocol. Respiration, ETCO2, SpO2, BP, heart rate and rhythm monitor placed and checked for adequate function Safety Precautions: Patient was assessed for positional comfort and pressure points before starting the procedure. Time-out: I initiated and conducted the "Time-out" before starting the procedure, as per protocol. The patient was asked to participate by confirming the accuracy of the "Time Out" information. Verification of the correct person, site, and procedure were performed and confirmed by me, the nursing staff, and the patient. "Time-out" conducted as per Joint Commission's Universal Protocol (UP.01.01.01). "Time-out" Date & Time: 04/11/2017; 0840 hrs.  Description of Procedure Process:   Position: Sitting Target Area: Knee Joint Approach: Just above the Lateral tibial plateau, lateral to the infrapatellar tendon. Area Prepped: Entire knee area, from the mid-thigh to the mid-shin. Prepping solution: ChloraPrep (2% chlorhexidine gluconate and 70% isopropyl alcohol) Safety Precautions: Aspiration looking for blood return was conducted prior to all injections. At no point did we inject any substances, as a needle was being advanced. No attempts were made at seeking any paresthesias. Safe injection practices and needle disposal techniques used. Medications properly checked for expiration dates. SDV (single dose vial) medications used. Description of the Procedure: Protocol guidelines were followed. The patient was placed in position over the fluoroscopy table. The target area was identified and the area prepped in the usual manner. Skin desensitized using vapocoolant spray. Skin & deeper tissues infiltrated with local anesthetic. Appropriate amount of time allowed to pass for local anesthetics to take effect. The  procedure needles were then advanced to the target area. Proper needle placement secured. Negative aspiration confirmed. Solution injected in intermittent fashion, asking for systemic symptoms every 0.5cc of injectate. The needles were then removed and the area cleansed, making sure to leave some of the prepping solution back to take advantage of its long term bactericidal properties. Vitals:   04/11/17 0803 04/11/17 0848  BP: 121/70 121/68  Pulse: 94 89  Resp: 16 16  Temp: 98 F (36.7 C)   SpO2: 94% 98%  Weight: 280 lb (127 kg)  Height: 5\' 9"  (1.753 m)     Start Time: 0840 hrs. End Time: 0845 hrs. Materials:  Needle(s) Type: Regular needle Gauge: 22G Length: 3.5-in Medication(s): We administered lidocaine (PF), Sodium Hyaluronate, Sodium Hyaluronate, and ropivacaine (PF) 2 mg/mL (0.2%). Please see chart orders for dosing details.  Imaging Guidance:  Type of Imaging Technique: None used Indication(s): N/A Exposure Time: No patient exposure Contrast: None used. Fluoroscopic Guidance: N/A Ultrasound Guidance: N/A Interpretation: N/A  Antibiotic Prophylaxis:  Indication(s): None identified Antibiotic given: None  Post-operative Assessment:  EBL: None Complications: No immediate post-treatment complications observed by team, or reported by patient. Note: The patient tolerated the entire procedure well. A repeat set of vitals were taken after the procedure and the patient was kept under observation following institutional policy, for this type of procedure. Post-procedural neurological assessment was performed, showing return to baseline, prior to discharge. The patient was provided with post-procedure discharge instructions, including a section on how to identify potential problems. Should any problems arise concerning this procedure, the patient was given instructions to immediately contact us, at any time, without hesitation. In any case, we plan to contact the patient by telephone  for a follow-up status report regarding this interventional procedure. Comments:  No additional relevant information.  Plan of Care  Disposition: Discharge home  Discharge Date & Time: 04/11/2017; 0850 hrs.  Physician-requested Follow-up:  Return for procedure (no sedation), (2 wks) Bilateral IA Knee Hyalgan #3.  Future Appointments Date Time Provider Department Center  06/02/2017 9:30 AM Barbette Merino, NP [facility] None   Medications ordered for procedure: Meds ordered this encounter  Medications  . lidocaine (PF) (XYLOCAINE) 1 % injection 5 mL  . Sodium Hyaluronate SOSY 2 mL  . Sodium Hyaluronate SOSY 2 mL  . ropivacaine (PF) 2 mg/mL (0.2%) (NAROPIN) injection 4 mL   Medications administered: We administered lidocaine (PF), Sodium Hyaluronate, Sodium Hyaluronate, and ropivacaine (PF) 2 mg/mL (0.2%).  See the medical record for exact dosing, route, and time of administration.  Lab-work, Procedure(s), & Referral(s) Ordered: Orders Placed This Encounter  Procedures  . KNEE INJECTION  . KNEE INJECTION  . Informed Consent Details: Transcribe to consent form and obtain patient signature  . Provider attestation of informed consent for procedure/surgical case  . Verify informed consent  . Discharge instructions  . Follow-up   Imaging Ordered: Results for orders placed in visit on 02/16/17  DG C-Arm 1-60 Min-No Report   Narrative Fluoroscopy was utilized by the requesting physician.  No radiographic  interpretation.    New Prescriptions   No medications on file   Primary Care Physician: Bobbye Morton, MD Location: [facility] Note by: Oswaldo Done, MD Date: 04/11/2017; Time: 9:56 AM  Disclaimer:  Medicine is not an exact science. The only guarantee in medicine is that nothing is guaranteed. It is important to note that the decision to proceed with this intervention was based on the information collected from the patient. The Data and  conclusions were drawn from the patient's questionnaire, the interview, and the physical examination. Because the information was provided in large part by the patient, it cannot be guaranteed that it has not been purposely or unconsciously manipulated. Every effort has been made to obtain as much relevant data as possible for this evaluation. It is important to note that the conclusions that lead to this procedure are derived in large part from the available data. Always take into account that the treatment will also be dependent on  availability of resources and existing treatment guidelines, considered by other Pain Management Practitioners as being common knowledge and practice, at the time of the intervention. For Medico-Legal purposes, it is also important to point out that variation in procedural techniques and pharmacological choices are the acceptable norm. The indications, contraindications, technique, and results of the above procedure should only be interpreted and judged by a Board-Certified Interventional Pain Specialist with extensive familiarity and expertise in the same exact procedure and technique.  Instructions provided at this appointment: Patient Instructions  ____________________________________________________________________________________________  Post-Procedure instructions Instructions:  Apply ice: Fill a plastic sandwich bag with crushed ice. Cover it with a small towel and apply to injection site. Apply for 15 minutes then remove x 15 minutes. Repeat sequence on day of procedure, until you go to bed. The purpose is to minimize swelling and discomfort after procedure.  Apply heat: Apply heat to procedure site starting the day following the procedure. The purpose is to treat any soreness and discomfort from the procedure.  Food intake: Start with clear liquids (like water) and advance to regular food, as tolerated.   Physical activities: Keep activities to a minimum for the  first 8 hours after the procedure.   Driving: If you have received any sedation, you are not allowed to drive for 24 hours after your procedure.  Blood thinner: Restart your blood thinner 6 hours after your procedure. (Only for those taking blood thinners)  Insulin: As soon as you can eat, you may resume your normal dosing schedule. (Only for those taking insulin)  Infection prevention: Keep procedure site clean and dry.  Post-procedure Pain Diary: Extremely important that this be done correctly and accurately. Recorded information will be used to determine the next step in treatment.  Pain evaluated is that of treated area only. Do not include pain from an untreated area.  Complete every hour, on the hour, for the initial 8 hours. Set an alarm to help you do this part accurately.  Do not go to sleep and have it completed later. It will not be accurate.  Follow-up appointment: Keep your follow-up appointment after the procedure. Usually 2 weeks for most procedures. (6 weeks in the case of radiofrequency.) Bring you pain diary.  Expect:  From numbing medicine (AKA: Local Anesthetics): Numbness or decrease in pain.  Onset: Full effect within 15 minutes of injected.  Duration: It will depend on the type of local anesthetic used. On the average, 1 to 8 hours.   From steroids: Decrease in swelling or inflammation. Once inflammation is improved, relief of the pain will follow.  Onset of benefits: Depends on the amount of swelling present. The more swelling, the longer it will take for the benefits to be seen. In some cases, up to 10 days.  Duration: Steroids will stay in the system x 2 weeks. Duration of benefits will depend on multiple posibilities including persistent irritating factors.  From procedure: Some discomfort is to be expected once the numbing medicine wears off. This should be minimal if ice and heat are applied as instructed. Call if:  You experience numbness and weakness  that gets worse with time, as opposed to wearing off.  New onset bowel or bladder incontinence. (Spinal procedures only)  Emergency Numbers:  Durning business hours (Monday - Thursday, 8:00 AM - 4:00 PM) (Friday, 9:00 AM - 12:00 Noon): [phone]  After hours: [phone] ____________________________________________________________________________________________  ____________________________________________________________________________________________  Preparing for your procedure (without sedation) Instructions: . Oral Intake: Do not eat or  drink anything for at least 3 hours prior to your procedure. . Transportation: Unless otherwise stated by your physician, you may drive yourself after the procedure. . Blood Pressure Medicine: Take your blood pressure medicine with a sip of water the morning of the procedure. . Blood thinners:  . Diabetics on insulin: Notify the staff so that you can be scheduled 1st case in the morning. If your diabetes requires high dose insulin, take only  of your normal insulin dose the morning of the procedure and notify the staff that you have done so. . Preventing infections: Shower with an antibacterial soap the morning of your procedure.  . Build-up your immune system: Take 1000 mg of Vitamin C with every meal (3 times a day) the day prior to your procedure. [place] Kitchen Antibiotics: Inform the staff if you have a condition or reason that requires you to take antibiotics before dental procedures. . Pregnancy: If you are pregnant, call and cancel the procedure. . Sickness: If you have a cold, fever, or any active infections, call and cancel the procedure. . Arrival: You must be in the facility at least 30 minutes prior to your scheduled procedure. . Children: Do not bring any children with you. . Dress appropriately: Bring dark clothing that you would not mind if they get stained. . Valuables: Do not bring any jewelry or valuables. Procedure appointments are  reserved for interventional treatments only. [place] Kitchen No Prescription Refills. . No medication changes will be discussed during procedure appointments. . No disability issues will be discussed. ____________________________________________________________________________________________

## 2017-04-12 ENCOUNTER — Telehealth: Payer: Self-pay | Admitting: *Deleted

## 2017-04-12 NOTE — Telephone Encounter (Signed)
No problems post procedure. 

## 2017-04-21 ENCOUNTER — Ambulatory Visit: Payer: Medicare (Managed Care) | Admitting: Pain Medicine

## 2017-04-26 ENCOUNTER — Encounter: Payer: Self-pay | Admitting: Pain Medicine

## 2017-04-26 ENCOUNTER — Ambulatory Visit: Payer: Medicare (Managed Care) | Attending: Pain Medicine | Admitting: Pain Medicine

## 2017-04-26 DIAGNOSIS — M7051 Other bursitis of knee, right knee: Secondary | ICD-10-CM | POA: Insufficient documentation

## 2017-04-26 DIAGNOSIS — Z7982 Long term (current) use of aspirin: Secondary | ICD-10-CM | POA: Insufficient documentation

## 2017-04-26 DIAGNOSIS — Z79899 Other long term (current) drug therapy: Secondary | ICD-10-CM | POA: Insufficient documentation

## 2017-04-26 DIAGNOSIS — M170 Bilateral primary osteoarthritis of knee: Secondary | ICD-10-CM | POA: Insufficient documentation

## 2017-04-26 DIAGNOSIS — M1712 Unilateral primary osteoarthritis, left knee: Secondary | ICD-10-CM

## 2017-04-26 DIAGNOSIS — M25561 Pain in right knee: Secondary | ICD-10-CM | POA: Diagnosis present

## 2017-04-26 DIAGNOSIS — G8929 Other chronic pain: Secondary | ICD-10-CM | POA: Insufficient documentation

## 2017-04-26 DIAGNOSIS — M25562 Pain in left knee: Secondary | ICD-10-CM | POA: Diagnosis not present

## 2017-04-26 DIAGNOSIS — Z79891 Long term (current) use of opiate analgesic: Secondary | ICD-10-CM | POA: Diagnosis not present

## 2017-04-26 DIAGNOSIS — M1711 Unilateral primary osteoarthritis, right knee: Secondary | ICD-10-CM

## 2017-04-26 MED ORDER — SODIUM HYALURONATE (VISCOSUP) 20 MG/2ML IX SOSY
2.0000 mL | PREFILLED_SYRINGE | Freq: Once | INTRA_ARTICULAR | Status: AC
  Administered 2017-04-26: 2 mL via INTRA_ARTICULAR
  Filled 2017-04-26: qty 2

## 2017-04-26 MED ORDER — LIDOCAINE HCL (PF) 1 % IJ SOLN
5.0000 mL | Freq: Once | INTRAMUSCULAR | Status: DC
  Filled 2017-04-26: qty 5

## 2017-04-26 MED ORDER — ROPIVACAINE HCL 2 MG/ML IJ SOLN
2.0000 mL | Freq: Once | INTRAMUSCULAR | Status: AC
  Administered 2017-04-26: 2 mL via INTRA_ARTICULAR
  Filled 2017-04-26: qty 10

## 2017-04-26 MED ORDER — LIDOCAINE HCL (PF) 1 % IJ SOLN
5.0000 mL | Freq: Once | INTRAMUSCULAR | Status: AC
  Administered 2017-04-26: 5 mL
  Filled 2017-04-26: qty 5

## 2017-04-26 MED ORDER — METHYLPREDNISOLONE ACETATE 40 MG/ML IJ SUSP
40.0000 mg | Freq: Once | INTRAMUSCULAR | Status: AC
  Administered 2017-04-26: 40 mg via INTRA_ARTICULAR
  Filled 2017-04-26: qty 1

## 2017-04-26 NOTE — Patient Instructions (Signed)
Pain Management Discharge Instructions  General Discharge Instructions :  If you need to reach your doctor call: Monday-Friday 8:00 am - 4:00 pm at [phone] or toll free [phone].  After clinic hours [phone] to have operator reach doctor.  Bring all of your medication bottles to all your appointments in the pain clinic.  To cancel or reschedule your appointment with Pain Management please remember to call 24 hours in advance to avoid a fee.  Refer to the educational materials which you have been given on: General Risks, I had my Procedure. Discharge Instructions, Post Sedation.  Post Procedure Instructions:  The drugs you were given will stay in your system until tomorrow, so for the next 24 hours you should not drive, make any legal decisions or drink any alcoholic beverages.  You may eat anything you prefer, but it is better to start with liquids then soups and crackers, and gradually work up to solid foods.  Please notify your doctor immediately if you have any unusual bleeding, trouble breathing or pain that is not related to your normal pain.  Depending on the type of procedure that was done, some parts of your body may feel week and/or numb.  This usually clears up by tonight or the next day.  Walk with the use of an assistive device or accompanied by an adult for the 24 hours.  You may use ice on the affected area for the first 24 hours.  Put ice in a Ziploc bag and cover with a towel and place against area 15 minutes on 15 minutes off.  You may switch to heat after 24 hours.Knee Injection A knee injection is a procedure to get medicine into your knee joint. Your health care provider puts a needle into the joint and injects medicine with an attached syringe. The injected medicine may relieve the pain, swelling, and stiffness of arthritis. The injected medicine may also help to lubricate and cushion your knee joint. You may need more than one injection. Tell a health  care provider about:  Any allergies you have.  All medicines you are taking, including vitamins, herbs, eye drops, creams, and over-the-counter medicines.  Any problems you or family members have had with anesthetic medicines.  Any blood disorders you have.  Any surgeries you have had.  Any medical conditions you have. What are the risks? Generally, this is a safe procedure. However, problems may occur, including:  Infection.  Bleeding.  Worsening symptoms.  Damage to the area around your knee.  Allergic reaction to any of the medicines.  Skin reactions from repeated injections.  What happens before the procedure?  Ask your health care provider about changing or stopping your regular medicines. This is especially important if you are taking diabetes medicines or blood thinners.  Plan to have someone take you home after the procedure. What happens during the procedure?  You will sit or lie down in a position for your knee to be treated.  The skin over your kneecap will be cleaned with a germ-killing solution (antiseptic).  You will be given a medicine that numbs the area (local anesthetic). You may feel some stinging.  After your knee becomes numb, you will have a second injection. This is the medicine. This needle is carefully placed between your kneecap and your knee. The medicine is injected into the joint space.  At the end of the procedure, the needle will be removed.  A bandage (dressing) may be placed over the injection site. The procedure may   vary among health care providers and hospitals. What happens after the procedure?  You may have to move your knee through its full range of motion. This helps to get all of the medicine into your joint space.  Your blood pressure, heart rate, breathing rate, and blood oxygen level will be monitored often until the medicines you were given have worn off.  You will be watched to make sure that you do not have a reaction  to the injected medicine. This information is not intended to replace advice given to you by your health care provider. Make sure you discuss any questions you have with your health care provider. Document Released: 11/28/2006 Document Revised: 02/06/2016 Document Reviewed: 07/17/2014 [employer] Interactive Patient Education  2018 [employer]  

## 2017-04-26 NOTE — Progress Notes (Signed)
Safety precautions to be maintained throughout the outpatient stay will include: orient to surroundings, keep bed in low position, maintain call bell within reach at all times, provide assistance with transfer out of bed and ambulation.  

## 2017-04-26 NOTE — Progress Notes (Addendum)
Patient's Name: James Friedman  MRN: [id number]  Referring Provider: Delano MetzNaveira, James Talbot, MD  DOB: February 23, 1946  PCP: James Friedman, James A, MD  DOS: 04/26/2017  Note by: James DoneFrancisco A Esteen Delpriore, MD  Service setting: Ambulatory outpatient  Specialty: Interventional Pain Management  Patient type: Established  Location: [facility] (AMB) Pain Management Facility  Visit type: Interventional Procedure   Primary Reason for Visit: Interventional Pain Management Treatment. CC: Knee Pain (right)  Procedure #1:  Anesthesia, Analgesia, Anxiolysis:  Type: Therapeutic Intra-Articular Hyalgan Knee Injection #3 Region: Lateral infrapatellar Knee Region Level: Knee Joint Laterality: Bilateral  Type: Local Anesthesia Local Anesthetic: Lidocaine 1% Route: Infiltration ([place]ckley/IM) IV Access: Declined Sedation: Declined  Indication(s): Analgesia          Indications: 1. Osteoarthritis of knee (Bilateral)   2. Primary osteoarthritis of right knee   3. Primary osteoarthritis of left knee   4. Chronic knee pain (Bilateral)    Procedure #2:   Type: Diagnostic Suprapatellar Knee Bursa Injection Region: Suprapatellar Knee Region Level: Knee Joint Laterality: Right knee   Indications: 1. Suprapatellar bursitis of right knee    Pain Score: Pre-procedure: 8 /10 Post-procedure: 0-No pain/10  Pre-op Assessment:  Previous date of service: 04/11/17  [name] is a 71 y.o. (year old), male patient, seen today for interventional treatment. He  has a past surgical history that includes Knee surgery (Bilateral); Neck surgery; Carpal tunnel release; Shoulder surgery (Right); Cataract extraction, extracapsular (Left, 08/04/2015); and Cataract extraction w/PHACO (Right, 04/19/2016). [name] has a current medication list which includes the following prescription(s): acetaminophen, albuterol, aluminum hydroxide, amlodipine, antiseptic oral rinse, aspirin, atorvastatin, azelastine, capsaicin, carboxymethylcellul-glycerin, cetirizine,  chlorpheniramine, clotrimazole, diclofenac sodium, docusate sodium, duloxetine, ergocalciferol, fluticasone, gabapentin, glucagon, hydroxyzine, insulin aspart, insulin detemir, lidocaine, lisinopril, magnesium, metformin, methocarbamol, metoprolol tartrate, mometasone, morphine, morphine, multivitamin, naloxone, naproxen, fish oil, omeprazole, oxycodone, oxycodone, oxygen-helium, pantoprazole, polyethylene glycol powder, promethazine, senna, sitagliptin-metformin, testosterone cypionate, trazodone, vitamin c, vitamin a & d, morphine, and oxycodone, and the following Facility-Administered Medications: lidocaine (pf). His primarily concern today is the Knee Pain (right)  Initial Vital Signs: Blood pressure (!) 125/95, pulse 86, temperature 98.5 F (36.9 C), temperature source Oral, resp. rate (!) 22, height 5\' 9"  (1.753 m), weight 282 lb (127.9 kg), SpO2 96 %. BMI: Estimated body mass index is 41.64 kg/m as calculated from the following:   Height as of this encounter: 5\' 9"  (1.753 m).   Weight as of this encounter: 282 lb (127.9 kg).  Risk Assessment: Allergies: Reviewed. He has No Known Allergies.  Allergy Precautions: None required Coagulopathies: Reviewed. None identified.  Blood-thinner therapy: None at this time Active Infection(s): Reviewed. None identified. [name] is afebrile  Site Confirmation: [name] was asked to confirm the procedure and laterality before marking the site Procedure checklist: Completed Consent: Before the procedure and under the influence of no sedative(s), amnesic(s), or anxiolytics, the patient was informed of the treatment options, risks and possible complications. To fulfill our ethical and legal obligations, as recommended by the American Medical Association's Code of Ethics, I have informed the patient of my clinical impression; the nature and purpose of the treatment or procedure; the risks, benefits, and possible complications of the intervention; the  alternatives, including doing nothing; the risk(s) and benefit(s) of the alternative treatment(s) or procedure(s); and the risk(s) and benefit(s) of doing nothing. The patient was provided information about the general risks and possible complications associated with the procedure. These may include, but are not limited to: failure to achieve desired goals, infection,  bleeding, organ or nerve damage, allergic reactions, paralysis, and death. In addition, the patient was informed of those risks and complications associated to the procedure, such as failure to decrease pain; infection; bleeding; organ or nerve damage with subsequent damage to sensory, motor, and/or autonomic systems, resulting in permanent pain, numbness, and/or weakness of one or several areas of the body; allergic reactions; (i.e.: anaphylactic reaction); and/or death. Furthermore, the patient was informed of those risks and complications associated with the medications. These include, but are not limited to: allergic reactions (i.e.: anaphylactic or anaphylactoid reaction(s)); adrenal axis suppression; blood sugar elevation that in diabetics may result in ketoacidosis or comma; water retention that in patients with history of congestive heart failure may result in shortness of breath, pulmonary edema, and decompensation with resultant heart failure; weight gain; swelling or edema; medication-induced neural toxicity; particulate matter embolism and blood vessel occlusion with resultant organ, and/or nervous system infarction; and/or aseptic necrosis of one or more joints. Finally, the patient was informed that Medicine is not an exact science; therefore, there is also the possibility of unforeseen or unpredictable risks and/or possible complications that may result in a catastrophic outcome. The patient indicated having understood very clearly. We have given the patient no guarantees and we have made no promises. Enough time was given to the  patient to ask questions, all of which were answered to the patient's satisfaction. Mr. Hennes has indicated that he wanted to continue with the procedure. Attestation: I, the ordering provider, attest that I have discussed with the patient the benefits, risks, side-effects, alternatives, likelihood of achieving goals, and potential problems during recovery for the procedure that I have provided informed consent. Date: 04/26/2017; Time: 10:41 AM  Pre-Procedure Preparation:  Monitoring: As per clinic protocol. Respiration, ETCO2, SpO2, BP, heart rate and rhythm monitor placed and checked for adequate function Safety Precautions: Patient was assessed for positional comfort and pressure points before starting the procedure. Time-out: I initiated and conducted the "Time-out" before starting the procedure, as per protocol. The patient was asked to participate by confirming the accuracy of the "Time Out" information. Verification of the correct person, site, and procedure were performed and confirmed by me, the nursing staff, and the patient. "Time-out" conducted as per Joint Commission's Universal Protocol (UP.01.01.01). "Time-out" Date & Time: 04/26/2017; 1054 hrs.  Description of Procedure #1 Process:   Position: Sitting Target Area: Knee Joint Approach: Just above the Lateral tibial plateau, lateral to the infrapatellar tendon. Area Prepped: Entire knee area, from the mid-thigh to the mid-shin. Prepping solution: ChloraPrep (2% chlorhexidine gluconate and 70% isopropyl alcohol) Safety Precautions: Aspiration looking for blood return was conducted prior to all injections. At no point did we inject any substances, as a needle was being advanced. No attempts were made at seeking any paresthesias. Safe injection practices and needle disposal techniques used. Medications properly checked for expiration dates. SDV (single dose vial) medications used. Description of the Procedure: Protocol guidelines were  followed. The patient was placed in position over the fluoroscopy table. The target area was identified and the area prepped in the usual manner. Skin desensitized using vapocoolant spray. Skin & deeper tissues infiltrated with local anesthetic. Appropriate amount of time allowed to pass for local anesthetics to take effect. The procedure needles were then advanced to the target area. Proper needle placement secured. Negative aspiration confirmed. Solution injected in intermittent fashion, asking for systemic symptoms every 0.5cc of injectate. The needles were then removed and the area cleansed, making sure to leave some  of the prepping solution back to take advantage of its long term bactericidal properties.  04/26/17 0957  BP: (!) 125/95  Pulse: 86  Resp: (!) 22  Temp: 98.5 F (36.9 C)  TempSrc: Oral  SpO2: 96%  Weight: 282 lb (127.9 kg)  Height: 5\' 9"  (1.753 m)    Start Time: 1055 hrs. Materials:  Needle(s) Type: Regular needle Gauge: 22G Length: 3.5-in Medication(s): We administered Sodium Hyaluronate, Sodium Hyaluronate, ropivacaine (PF) 2 mg/mL (0.2%), lidocaine (PF), methylPREDNISolone acetate, and ropivacaine (PF) 2 mg/mL (0.2%). Please see chart orders for dosing details.  Description of Procedure #2 Process:   Position: Sitting Target Area: Knee Joint Approach: Just above the Patella Area Prepped: Entire knee area, from the mid-thigh to the mid-shin. Prepping solution: ChloraPrep (2% chlorhexidine gluconate and 70% isopropyl alcohol) Safety Precautions: Aspiration looking for blood return was conducted prior to all injections. At no point did we inject any substances, as a needle was being advanced. No attempts were made at seeking any paresthesias. Safe injection practices and needle disposal techniques used. Medications properly checked for expiration dates. SDV (single dose vial) medications used. Description of the Procedure: Protocol guidelines were followed. The patient was  placed in position over the fluoroscopy table. The target area was identified and the area prepped in the usual manner. Skin desensitized using vapocoolant spray. Skin & deeper tissues infiltrated with local anesthetic. Appropriate amount of time allowed to pass for local anesthetics to take effect. The procedure needles were then advanced to the target area. Proper needle placement secured. Negative aspiration confirmed. Solution injected in intermittent fashion, asking for systemic symptoms every 0.5cc of injectate. The needles were then removed and the area cleansed, making sure to leave some of the prepping solution back to take advantage of its long term bactericidal properties.  Vitals:   04/26/17 1055 04/26/17 1100  BP: (!) 133/48 (!) 84/48  Pulse:  80  Resp:  16  Temp:    TempSrc:    SpO2:  97%  Weight:    Height:     End Time: 1059 hrs. Materials:  Needle(s) Type: Regular needle Gauge: 22G Length: 3.5-in Medication(s): We administered Sodium Hyaluronate, Sodium Hyaluronate, ropivacaine (PF) 2 mg/mL (0.2%), lidocaine (PF), methylPREDNISolone acetate, and ropivacaine (PF) 2 mg/mL (0.2%). Please see chart orders for dosing details.  Imaging Guidance:  Type of Imaging Technique: None used Indication(s): N/A Exposure Time: No patient exposure Contrast: None used. Fluoroscopic Guidance: N/A Ultrasound Guidance: N/A Interpretation: N/A  Antibiotic Prophylaxis:  Indication(s): None identified Antibiotic given: None  Post-operative Assessment:  EBL: None Complications: No immediate post-treatment complications observed by team, or reported by patient. Note: The patient tolerated the entire procedure well. A repeat set of vitals were taken after the procedure and the patient was kept under observation following institutional policy, for this type of procedure. Post-procedural neurological assessment was performed, showing return to baseline, prior to discharge. The patient was  provided with post-procedure discharge instructions, including a section on how to identify potential problems. Should any problems arise concerning this procedure, the patient was given instructions to immediately contact us, at any time, without hesitation. In any case, we plan to contact the patient by telephone for a follow-up status report regarding this interventional procedure. Comments:  No additional relevant information.  Plan of Care  Disposition: Discharge home  Discharge Date & Time: 04/26/2017; 1101 hrs.  Physician-requested Follow-up:  Return in about 2 weeks (around 05/10/2017) for Procedure (no sedation): (B) IA Hyalgan Knee BLK #4.  New Prescriptions   No medications on file   Future Appointments Date Time Provider Department Center  05/12/2017 9:00 AM Delano Metz, MD [facility] None  06/02/2017 9:30 AM Barbette Merino, NP [facility] None   Imaging Orders  No imaging studies ordered today    Procedure Orders     KNEE INJECTION Medications ordered for procedure: Meds ordered this encounter  Medications  . lidocaine (PF) (XYLOCAINE) 1 % injection 5 mL  . Sodium Hyaluronate SOSY 2 mL  . Sodium Hyaluronate SOSY 2 mL  . ropivacaine (PF) 2 mg/mL (0.2%) (NAROPIN) injection 2 mL  . lidocaine (PF) (XYLOCAINE) 1 % injection 5 mL  . methylPREDNISolone acetate (DEPO-MEDROL) injection 40 mg  . ropivacaine (PF) 2 mg/mL (0.2%) (NAROPIN) injection 2 mL   Medications administered: We administered Sodium Hyaluronate, Sodium Hyaluronate, ropivacaine (PF) 2 mg/mL (0.2%), lidocaine (PF), methylPREDNISolone acetate, and ropivacaine (PF) 2 mg/mL (0.2%).  See the medical record for exact dosing, route, and time of administration.  Primary Care Physician: James Morton, MD Location: [facility] Note by: James Done, MD Date: 04/26/2017; Time: 2:18 PM  Disclaimer:  Medicine is not an Visual merchandiser. The only guarantee in medicine is that  nothing is guaranteed. It is important to note that the decision to proceed with this intervention was based on the information collected from the patient. The Data and conclusions were drawn from the patient's questionnaire, the interview, and the physical examination. Because the information was provided in large part by the patient, it cannot be guaranteed that it has not been purposely or unconsciously manipulated. Every effort has been made to obtain as much relevant data as possible for this evaluation. It is important to note that the conclusions that lead to this procedure are derived in large part from the available data. Always take into account that the treatment will also be dependent on availability of resources and existing treatment guidelines, considered by other Pain Management Practitioners as being common knowledge and practice, at the time of the intervention. For Medico-Legal purposes, it is also important to point out that variation in procedural techniques and pharmacological choices are the acceptable norm. The indications, contraindications, technique, and results of the above procedure should only be interpreted and judged by a Board-Certified Interventional Pain Specialist with extensive familiarity and expertise in the same exact procedure and technique.

## 2017-04-27 ENCOUNTER — Telehealth: Payer: Self-pay | Admitting: *Deleted

## 2017-04-27 NOTE — Telephone Encounter (Signed)
Spoke with patient, verbalizes no questions or concerns re; procedure on yesterday.  

## 2017-05-12 ENCOUNTER — Encounter: Payer: Self-pay | Admitting: Pain Medicine

## 2017-05-12 ENCOUNTER — Ambulatory Visit: Payer: Medicare (Managed Care) | Attending: Pain Medicine | Admitting: Pain Medicine

## 2017-05-12 VITALS — BP 133/81 | HR 82 | Temp 98.2°F | Resp 18 | Ht 69.0 in | Wt 292.0 lb

## 2017-05-12 DIAGNOSIS — G8929 Other chronic pain: Secondary | ICD-10-CM

## 2017-05-12 DIAGNOSIS — M1711 Unilateral primary osteoarthritis, right knee: Secondary | ICD-10-CM

## 2017-05-12 DIAGNOSIS — M1712 Unilateral primary osteoarthritis, left knee: Secondary | ICD-10-CM

## 2017-05-12 DIAGNOSIS — M9983 Other biomechanical lesions of lumbar region: Secondary | ICD-10-CM | POA: Diagnosis not present

## 2017-05-12 DIAGNOSIS — Z9889 Other specified postprocedural states: Secondary | ICD-10-CM | POA: Diagnosis not present

## 2017-05-12 DIAGNOSIS — M25562 Pain in left knee: Secondary | ICD-10-CM | POA: Diagnosis not present

## 2017-05-12 DIAGNOSIS — M170 Bilateral primary osteoarthritis of knee: Secondary | ICD-10-CM

## 2017-05-12 DIAGNOSIS — M48061 Spinal stenosis, lumbar region without neurogenic claudication: Secondary | ICD-10-CM | POA: Insufficient documentation

## 2017-05-12 DIAGNOSIS — Z9841 Cataract extraction status, right eye: Secondary | ICD-10-CM | POA: Diagnosis not present

## 2017-05-12 DIAGNOSIS — M25561 Pain in right knee: Secondary | ICD-10-CM | POA: Insufficient documentation

## 2017-05-12 MED ORDER — SODIUM HYALURONATE (VISCOSUP) 20 MG/2ML IX SOSY
2.0000 mL | PREFILLED_SYRINGE | Freq: Once | INTRA_ARTICULAR | Status: AC
  Administered 2017-05-12: 2 mL via INTRA_ARTICULAR
  Filled 2017-05-12: qty 2

## 2017-05-12 MED ORDER — LIDOCAINE HCL (PF) 1 % IJ SOLN
5.0000 mL | Freq: Once | INTRAMUSCULAR | Status: AC
  Administered 2017-05-12: 5 mL
  Filled 2017-05-12: qty 5

## 2017-05-12 MED ORDER — ROPIVACAINE HCL 2 MG/ML IJ SOLN
4.0000 mL | Freq: Once | INTRAMUSCULAR | Status: AC
  Administered 2017-05-12: 10 mL via INTRA_ARTICULAR
  Filled 2017-05-12: qty 10

## 2017-05-12 NOTE — Patient Instructions (Addendum)
____________________________________________________________________________________________Return in 2 weeks for procedure #5.  You will be having 2 procedures next time.  You states you do not want sedation.  Do not eat or drink for 3 hours prior to procedure.  Do not take diabetic medication the morning of the procedure.    Preparing for your procedure (without sedation) Instructions: . Oral Intake: Do not eat or drink anything for at least 3 hours prior to your procedure. . Transportation: Unless otherwise stated by your physician, you may drive yourself after the procedure. . Blood Pressure Medicine: Take your blood pressure medicine with a sip of water the morning of the procedure. . Blood thinners:  . Diabetics on insulin: Notify the staff so that you can be scheduled 1st case in the morning. If your diabetes requires high dose insulin, take only  of your normal insulin dose the morning of the procedure and notify the staff that you have done so. . Preventing infections: Shower with an antibacterial soap the morning of your procedure.  . Build-up your immune system: Take 1000 mg of Vitamin C with every meal (3 times a day) the day prior to your procedure. [place] Kitchen Antibiotics: Inform the staff if you have a condition or reason that requires you to take antibiotics before dental procedures. . Pregnancy: If you are pregnant, call and cancel the procedure. . Sickness: If you have a cold, fever, or any active infections, call and cancel the procedure. . Arrival: You must be in the facility at least 30 minutes prior to your scheduled procedure. . Children: Do not bring any children with you. . Dress appropriately: Bring dark clothing that you would not mind if they get stained. . Valuables: Do not bring any jewelry or valuables. Procedure appointments are reserved for interventional treatments only. [place]land Kitchen No Prescription Refills. . No medication changes will be discussed during procedure  appointments. . No disability issues will be discussed. ____________________________________________________________________________________________  ____________________________________________________________________________________________  Post-Procedure instructions Instructions:  Apply ice: Fill a plastic sandwich bag with crushed ice. Cover it with a small towel and apply to injection site. Apply for 15 minutes then remove x 15 minutes. Repeat sequence on day of procedure, until you go to bed. The purpose is to minimize swelling and discomfort after procedure.  Apply heat: Apply heat to procedure site starting the day following the procedure. The purpose is to treat any soreness and discomfort from the procedure.  Food intake: Start with clear liquids (like water) and advance to regular food, as tolerated.   Physical activities: Keep activities to a minimum for the first 8 hours after the procedure.   Driving: If you have received any sedation, you are not allowed to drive for 24 hours after your procedure.  Blood thinner: Restart your blood thinner 6 hours after your procedure. (Only for those taking blood thinners)  Insulin: As soon as you can eat, you may resume your normal dosing schedule. (Only for those taking insulin)  Infection prevention: Keep procedure site clean and dry.  Post-procedure Pain Diary: Extremely important that this be done correctly and accurately. Recorded information will be used to determine the next step in treatment.  Pain evaluated is that of treated area only. Do not include pain from an untreated area.  Complete every hour, on the hour, for the initial 8 hours. Set an alarm to help you do this part accurately.  Do not go to sleep and have it completed later. It will not be accurate.  Follow-up appointment: Keep your follow-up appointment  after the procedure. Usually 2 weeks for most procedures. (6 weeks in the case of radiofrequency.) Bring you  pain diary.  Expect:  From numbing medicine (AKA: Local Anesthetics): Numbness or decrease in pain.  Onset: Full effect within 15 minutes of injected.  Duration: It will depend on the type of local anesthetic used. On the average, 1 to 8 hours.   From steroids: Decrease in swelling or inflammation. Once inflammation is improved, relief of the pain will follow.  Onset of benefits: Depends on the amount of swelling present. The more swelling, the longer it will take for the benefits to be seen. In some cases, up to 10 days.  Duration: Steroids will stay in the system x 2 weeks. Duration of benefits will depend on multiple posibilities including persistent irritating factors.  From procedure: Some discomfort is to be expected once the numbing medicine wears off. This should be minimal if ice and heat are applied as instructed. Call if:  You experience numbness and weakness that gets worse with time, as opposed to wearing off.  New onset bowel or bladder incontinence. (Spinal procedures only)  Emergency Numbers:  Durning business hours (Monday - Thursday, 8:00 AM - 4:00 PM) (Friday, 9:00 AM - 12:00 Noon): [phone]  After hours: [phone] ____________________________________________________________________________________________  Post-procedure Information What to expect: Most procedures involve the use of a local anesthetic (numbing medicine), and a steroid (anti-inflammatory medicine).  The local anesthetics may cause temporary numbness and weakness of the legs or arms, depending on the location of the block. This numbness/weakness may last 4-6 hours, depending on the local anesthetic used. In rare instances, it can last up to 24 hours. While numb, you must be very careful not to injure the extremity.  After any procedure, you could expect the pain to get better within 15-20 minutes. This relief is temporary and may last 4-6 hours. Once the local anesthetics wears off, you  could experience discomfort, possibly more than usual, for up to 10 (ten) days. In the case of radiofrequencies, it may last up to 6 weeks. Surgeries may take up to 8 weeks for the healing process. The discomfort is due to the irritation caused by needles going through skin and muscle. To minimize the discomfort, we recommend using ice the first day, and heat from then on. The ice should be applied for 15 minutes on, and 15 minutes off. Keep repeating this cycle until bedtime. Avoid applying the ice directly to the skin, to prevent frostbite. Heat should be used daily, until the pain improves (4-10 days). Be careful not to burn yourself.  Occasionally you may experience muscle spasms or cramps. These occur as a consequence of the irritation caused by the needle sticks to the muscle and the blood that will inevitably be lost into the surrounding muscle tissue. Blood tends to be very irritating to tissues, which tend to react by going into spasm. These spasms may start the same day of your procedure, but they may also take days to develop. This late onset type of spasm or cramp is usually caused by electrolyte imbalances triggered by the steroids, at the level of the kidney. Cramps and spasms tend to respond well to muscle relaxants, multivitamins (some are triggered by the procedure, but may have their origins in vitamin deficiencies), and "Gatorade", or any sports drinks that can replenish any electrolyte imbalances. (If you are a diabetic, ask your pharmacist to get you a sugar-free brand.) Warm showers or baths may also be helpful.  Stretching exercises are highly recommended. General Instructions:  Be alert for signs of possible infection: redness, swelling, heat, red streaks, elevated temperature, and/or fever. These typically appear 4 to 6 days after the procedure. Immediately notify your doctor if you experience unusual bleeding, difficulty breathing, or loss of bowel or bladder control. If you experience  increased pain, do not increase your pain medicine intake, unless instructed by your pain physician. Post-Procedure Care:  Be careful in moving about. Muscle spasms in the area of the injection may occur. Applying ice or heat to the area is often helpful. The incidence of spinal headaches after epidural injections ranges between 1.4% and 6%. If you develop a headache that does not seem to respond to conservative therapy, please let your physician know. This can be treated with an epidural blood patch.   Post-procedure numbness or redness is to be expected, however it should average 4 to 6 hours. If numbness and weakness of your extremities begins to develop 4 to 6 hours after your procedure, and is felt to be progressing and worsening, immediately contact your physician.   Diet:  If you experience nausea, do not eat until this sensation goes away. If you had a "Stellate Ganglion Block" for upper extremity "Reflex Sympathetic Dystrophy", do not eat or drink until your hoarseness goes away. In any case, always start with liquids first and if you tolerate them well, then slowly progress to more solid foods. Activity:  For the first 4 to 6 hours after the procedure, use caution in moving about as you may experience numbness and/or weakness. Use caution in cooking, using household electrical appliances, and climbing steps. If you need to reach your Doctor call our office: [phone] Monday-Thursday 8:00 am - 4:00 PM    Fridays: Closed     In case of an emergency: In case of emergency, call 911 or go to the nearest emergency room and have the physician there call us.  Interpretation of Procedure Every nerve block has two components: a diagnostic component, and a treatment component. Unrealistic expectations are the most common causes of "perceived failure".  In a perfect world, a single nerve block should be able to completely and permanently eliminate the pain. Sadly, the world is not perfect.  Most  pain management nerve blocks are performed using local anesthetics and steroids. Steroids are responsible for any long-term benefit that you may experience. Their purpose is to decrease any chronic swelling that may exist in the area. Steroids begin to work immediately after being injected. However, most patients will not experience any benefits until 5 to 10 days after the injection, when the swelling has come down to the point where they can tell a difference. Steroids will only help if there is swelling to be treated. As such, they can assist with the diagnosis. If effective, they suggest an inflammatory component to the pain, and if ineffective, they rule out inflammation as the main cause or component of the problem. If the problem is one of mechanical compression, you will get no benefit from those steroids.   In the case of local anesthetics, they have a crucial role in the diagnosis of your condition. Most will begin to work within15 to 20 minutes after injection. The duration will depend on the type used (short- vs. Long-acting). It is of outmost importance that patients keep tract of their pain, after the procedure. To assist with this matter, a "Post-procedure Pain Diary" is provided. Make sure to complete it and to bring  it back to your follow-up appointment.  As long as the patient keeps accurate, detailed records of their symptoms after every procedure, and returns to have those interpreted, every procedure will provide [place] with invaluable information. Even a block that does not provide the patient with any relief, will always provide [place] with information about the mechanism and the origin of the pain. The only time a nerve block can be considered a waste of time is when patients do not keep track of the results, or do not keep their post-procedure appointment.  Reporting the results back to your physician The Pain Score  Pain is a subjective complaint. It cannot be seen, touched, or measured. We  depend entirely on the patient's report of the pain in order to assess your condition and treatment. To evaluate the pain, we use a pain scale, where "0" means "No Pain", and a "10" is "the worst possible pain that you can even imagine" (i.e. something like been eaten alive by a shark or being torn apart by a lion).   You will frequently be asked to rate your pain. Please be as accurate, remember that medical decisions will be based on your responses. Please do not rate your pain above a 10. Doing so is actually interpreted as "symptom magnification" (exaggeration), as well as lack of understanding with regards to the scale. To put this into perspective, when you tell us that your pain is at a 10 (ten), what you are saying is that there is nothing we can do to make this pain any worse. (Carefully think about that.)

## 2017-05-12 NOTE — Progress Notes (Signed)
Safety precautions to be maintained throughout the outpatient stay will include: orient to surroundings, keep bed in low position, maintain call bell within reach at all times, provide assistance with transfer out of bed and ambulation.  

## 2017-05-12 NOTE — Progress Notes (Signed)
Patient's Name: James Friedman  MRN: [id number]  Referring Provider: Bobbye Morton, MD  DOB: 02/29/1946  PCP: Bobbye Morton, MD  DOS: 05/12/2017  Note by: Oswaldo Done, MD  Service setting: Ambulatory outpatient  Specialty: Interventional Pain Management  Patient type: Established  Location: [facility] (AMB) Pain Management Facility  Visit type: Interventional Procedure   Primary Reason for Visit: Interventional Pain Management Treatment. CC: Knee Pain (bilateral)  Procedure:  Anesthesia, Analgesia, Anxiolysis:  Type: Therapeutic Intra-Articular Hyalgan Knee Injection #4 Region: Lateral infrapatellar Knee Region Level: Knee Joint Laterality: Bilateral  Type: Local Anesthesia Local Anesthetic: Lidocaine 1% Route: Infiltration ([place]/IM) IV Access: Declined Sedation: Declined  Indication(s): Analgesia          Indications: 1. Chronic knee pain (Bilateral)   2. Primary osteoarthritis of left knee   3. Primary osteoarthritis of right knee   4. Osteoarthritis of knee (Bilateral)   5. 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 (year old), male patient, seen today for interventional treatment. He  has a past surgical history that includes Knee surgery (Bilateral); Neck surgery; Carpal tunnel release; Shoulder surgery (Right); Cataract extraction, extracapsular (Left, 08/04/2015); and Cataract extraction w/PHACO (Right, 04/19/2016). James Friedman has a current medication list which includes the following prescription(s): acetaminophen, albuterol, aluminum hydroxide, amlodipine, antiseptic oral rinse, aspirin, atorvastatin, azelastine, capsaicin, carboxymethylcellul-glycerin, cetirizine, chlorpheniramine, clotrimazole, diclofenac sodium, docusate sodium, duloxetine, ergocalciferol, fluticasone, gabapentin, glucagon, hydroxyzine, insulin aspart, insulin detemir,  lidocaine, lisinopril, magnesium, metformin, methocarbamol, metoprolol tartrate, mometasone, multivitamin, naloxone, naproxen, fish oil, omeprazole, oxycodone, oxygen-helium, pantoprazole, polyethylene glycol powder, promethazine, senna, sitagliptin-metformin, testosterone cypionate, trazodone, vitamin c, vitamin a & d, morphine, morphine, morphine, oxycodone, and oxycodone. His primarily concern today is the Knee Pain (bilateral)  The patient indicates that the Hyalgan knee injections have worked wonders for his knee pain. He indicates that the knee pain at this point is only a 2/10 and it has improved more than 90%. However, he is having some pain in the area of the right knee that he describes as non-typical. The pain goes over the anterior portion of the thigh and he has been having difficulties with stumbling or he has noticed that he cannot lift his right leg as he used to. This appears to be in terms of flexion of the leg at the hip joint suggesting weakness of the psoas muscle. Between the weakness and the distribution of the pain it is likely that this is affecting the L2 and/or L3 nerve roots. Review of the patient's MRI done on 2016 reveals that he does have a right-sided L2-3 foraminal stenosis. Review of his symptoms has revealed that when he bends his spine towards the right side the symptoms worsen and when he does lateral bending towards the left they improve. This again would confirm that the pathology resized at the L2-3 neural foramina and therefore he will be scheduled to return for a right-sided transforaminal epidural steroid injection under fluoroscopic guidance and IV sedation. On the same visit, we will go ahead and also do his fifth bilateral intra-articular Hyalgan knee injection.  Initial Vital Signs: Blood pressure 121/75, pulse 82, temperature 98.2 F (36.8 C), temperature source Oral, resp. rate 16, height 5\' 9"  (1.753 m), weight 292 lb (132.5 kg), SpO2 97 %. BMI: Estimated body  mass index is 43.12 kg/m as calculated from the following:   Height as of this encounter:  5\' 9"  (1.753 m).   Weight as of this encounter: 292 lb (132.5 kg).  Risk Assessment: Allergies: Reviewed. He has No Known Allergies.  Allergy Precautions: None required Coagulopathies: Reviewed. None identified.  Blood-thinner therapy: None at this time Active Infection(s): Reviewed. None identified. James Friedman is afebrile  Site Confirmation: James Friedman was asked to confirm the procedure and laterality before marking the site Procedure checklist: Completed Consent: Before the procedure and under the influence of no sedative(s), amnesic(s), or anxiolytics, the patient was informed of the treatment options, risks and possible complications. To fulfill our ethical and legal obligations, as recommended by the American Medical Association's Code of Ethics, I have informed the patient of my clinical impression; the nature and purpose of the treatment or procedure; the risks, benefits, and possible complications of the intervention; the alternatives, including doing nothing; the risk(s) and benefit(s) of the alternative treatment(s) or procedure(s); and the risk(s) and benefit(s) of doing nothing. The patient was provided information about the general risks and possible complications associated with the procedure. These may include, but are not limited to: failure to achieve desired goals, infection, bleeding, organ or nerve damage, allergic reactions, paralysis, and death. In addition, the patient was informed of those risks and complications associated to the procedure, such as failure to decrease pain; infection; bleeding; organ or nerve damage with subsequent damage to sensory, motor, and/or autonomic systems, resulting in permanent pain, numbness, and/or weakness of one or several areas of the body; allergic reactions; (i.e.: anaphylactic reaction); and/or death. Furthermore, the patient was informed of those  risks and complications associated with the medications. These include, but are not limited to: allergic reactions (i.e.: anaphylactic or anaphylactoid reaction(s)); adrenal axis suppression; blood sugar elevation that in diabetics may result in ketoacidosis or comma; water retention that in patients with history of congestive heart failure may result in shortness of breath, pulmonary edema, and decompensation with resultant heart failure; weight gain; swelling or edema; medication-induced neural toxicity; particulate matter embolism and blood vessel occlusion with resultant organ, and/or nervous system infarction; and/or aseptic necrosis of one or more joints. Finally, the patient was informed that Medicine is not an exact science; therefore, there is also the possibility of unforeseen or unpredictable risks and/or possible complications that may result in a catastrophic outcome. The patient indicated having understood very clearly. We have given the patient no guarantees and we have made no promises. Enough time was given to the patient to ask questions, all of which were answered to the patient's satisfaction. Mr. Shedden has indicated that he wanted to continue with the procedure. Attestation: I, the ordering provider, attest that I have discussed with the patient the benefits, risks, side-effects, alternatives, likelihood of achieving goals, and potential problems during recovery for the procedure that I have provided informed consent. Date: 05/12/2017; Time: 9:44 AM  Pre-Procedure Preparation:  Monitoring: As per clinic protocol. Respiration, ETCO2, SpO2, BP, heart rate and rhythm monitor placed and checked for adequate function Safety Precautions: Patient was assessed for positional comfort and pressure points before starting the procedure. Time-out: I initiated and conducted the "Time-out" before starting the procedure, as per protocol. The patient was asked to participate by confirming the accuracy  of the "Time Out" information. Verification of the correct person, site, and procedure were performed and confirmed by me, the nursing staff, and the patient. "Time-out" conducted as per Joint Commission's Universal Protocol (UP.01.01.01). "Time-out" Date & Time: 05/12/2017; 1017 hrs.  Description of Procedure Process:   Position: Sitting Target  Area: Knee Joint Approach: Just above the Lateral tibial plateau, lateral to the infrapatellar tendon. Area Prepped: Entire knee area, from the mid-thigh to the mid-shin. Prepping solution: ChloraPrep (2% chlorhexidine gluconate and 70% isopropyl alcohol) Safety Precautions: Aspiration looking for blood return was conducted prior to all injections. At no point did we inject any substances, as a needle was being advanced. No attempts were made at seeking any paresthesias. Safe injection practices and needle disposal techniques used. Medications properly checked for expiration dates. SDV (single dose vial) medications used. Description of the Procedure: Protocol guidelines were followed. The patient was placed in position over the fluoroscopy table. The target area was identified and the area prepped in the usual manner. Skin desensitized using vapocoolant spray. Skin & deeper tissues infiltrated with local anesthetic. Appropriate amount of time allowed to pass for local anesthetics to take effect. The procedure needles were then advanced to the target area. Proper needle placement secured. Negative aspiration confirmed. Solution injected in intermittent fashion, asking for systemic symptoms every 0.5cc of injectate. The needles were then removed and the area cleansed, making sure to leave some of the prepping solution back to take advantage of its long term bactericidal properties. Vitals:   05/12/17 0916 05/12/17 1014 05/12/17 1022  BP: 121/75 113/73 133/81  Pulse: 82    Resp: 16 18 18   Temp: 98.2 F (36.8 C)    TempSrc: Oral    SpO2: 97% 98% 97%  Weight:  292 lb (132.5 kg)    Height: 5\' 9"  (1.753 m)      Start Time: 1018 hrs. End Time: 1020 hrs. Materials:  Needle(s) Type: Regular needle Gauge: 22G Length: 3.5-in Medication(s): We administered lidocaine (PF), Sodium Hyaluronate, Sodium Hyaluronate, and ropivacaine (PF) 2 mg/mL (0.2%). Please see chart orders for dosing details.  Imaging Guidance:  Type of Imaging Technique: None used Indication(s): N/A Exposure Time: No patient exposure Contrast: None used. Fluoroscopic Guidance: N/A Ultrasound Guidance: N/A Interpretation: N/A  Antibiotic Prophylaxis:  Indication(s): None identified Antibiotic given: None  Post-operative Assessment:  EBL: None Complications: No immediate post-treatment complications observed by team, or reported by patient. Note: The patient tolerated the entire procedure well. A repeat set of vitals were taken after the procedure and the patient was kept under observation following institutional policy, for this type of procedure. Post-procedural neurological assessment was performed, showing return to baseline, prior to discharge. The patient was provided with post-procedure discharge instructions, including a section on how to identify potential problems. Should any problems arise concerning this procedure, the patient was given instructions to immediately contact us, at any time, without hesitation. In any case, we plan to contact the patient by telephone for a follow-up status report regarding this interventional procedure. Comments:  No additional relevant information.  Plan of Care  Disposition: Discharge home  Discharge Date & Time: 05/12/2017; 1026 hrs.  Physician-requested Follow-up:  Return for Procedure (with sedation): Diagnostic right-sided L2 transforaminal epidural steroid injection + Bilateral intra-articular Hyalgan knee injection #5.   Future Appointments Date Time Provider Department Center  06/02/2017 9:30 AM Barbette Merino, NP [facility]  None   Imaging Orders  No imaging studies ordered today    Procedure Orders     KNEE INJECTION     KNEE INJECTION     Lumbar Transforaminal Epidural  Medications ordered for procedure: Meds ordered this encounter  Medications  . lidocaine (PF) (XYLOCAINE) 1 % injection 5 mL  . Sodium Hyaluronate SOSY 2 mL  . Sodium Hyaluronate SOSY 2  mL  . ropivacaine (PF) 2 mg/mL (0.2%) (NAROPIN) injection 4 mL   Medications administered: We administered lidocaine (PF), Sodium Hyaluronate, Sodium Hyaluronate, and ropivacaine (PF) 2 mg/mL (0.2%).  See the medical record for exact dosing, route, and time of administration.  New Prescriptions   No medications on file   Primary Care Physician: Bobbye Morton, MD Location: [facility] Out[facility] Note by: Oswaldo Done, MD Date: 05/12/2017; Time: 10:49 AM  Disclaimer:  Medicine is not an exact science. The only guarantee in medicine is that nothing is guaranteed. It is important to note that the decision to proceed with this intervention was based on the information collected from the patient. The Data and conclusions were drawn from the patient's questionnaire, the interview, and the physical examination. Because the information was provided in large part by the patient, it cannot be guaranteed that it has not been purposely or unconsciously manipulated. Every effort has been made to obtain as much relevant data as possible for this evaluation. It is important to note that the conclusions that lead to this procedure are derived in large part from the available data. Always take into account that the treatment will also be dependent on availability of resources and existing treatment guidelines, considered by other Pain Management Practitioners as being common knowledge and practice, at the time of the intervention. For Medico-Legal purposes, it is also important to point out that variation in procedural techniques and  pharmacological choices are the acceptable norm. The indications, contraindications, technique, and results of the above procedure should only be interpreted and judged by a Board-Certified Interventional Pain Specialist with extensive familiarity and expertise in the same exact procedure and technique.

## 2017-05-13 ENCOUNTER — Telehealth: Payer: Self-pay

## 2017-05-13 NOTE — Telephone Encounter (Signed)
Post procedure phone call.  Patient states he is doing good.  

## 2017-05-31 ENCOUNTER — Encounter: Payer: Self-pay | Admitting: Pain Medicine

## 2017-05-31 ENCOUNTER — Ambulatory Visit: Payer: Medicare (Managed Care) | Attending: Pain Medicine | Admitting: Pain Medicine

## 2017-05-31 VITALS — BP 144/81 | HR 79 | Temp 98.2°F | Resp 18 | Ht 69.0 in | Wt 282.0 lb

## 2017-05-31 DIAGNOSIS — M48061 Spinal stenosis, lumbar region without neurogenic claudication: Secondary | ICD-10-CM | POA: Diagnosis not present

## 2017-05-31 DIAGNOSIS — M79604 Pain in right leg: Secondary | ICD-10-CM | POA: Diagnosis not present

## 2017-05-31 DIAGNOSIS — G894 Chronic pain syndrome: Secondary | ICD-10-CM

## 2017-05-31 DIAGNOSIS — G8929 Other chronic pain: Secondary | ICD-10-CM

## 2017-05-31 DIAGNOSIS — M25561 Pain in right knee: Secondary | ICD-10-CM | POA: Diagnosis not present

## 2017-05-31 DIAGNOSIS — M546 Pain in thoracic spine: Secondary | ICD-10-CM | POA: Insufficient documentation

## 2017-05-31 DIAGNOSIS — M170 Bilateral primary osteoarthritis of knee: Secondary | ICD-10-CM | POA: Diagnosis not present

## 2017-05-31 DIAGNOSIS — M25562 Pain in left knee: Secondary | ICD-10-CM

## 2017-05-31 DIAGNOSIS — M5416 Radiculopathy, lumbar region: Secondary | ICD-10-CM | POA: Insufficient documentation

## 2017-05-31 DIAGNOSIS — M79605 Pain in left leg: Secondary | ICD-10-CM | POA: Insufficient documentation

## 2017-05-31 DIAGNOSIS — M1711 Unilateral primary osteoarthritis, right knee: Secondary | ICD-10-CM

## 2017-05-31 DIAGNOSIS — M1712 Unilateral primary osteoarthritis, left knee: Secondary | ICD-10-CM

## 2017-05-31 MED ORDER — LIDOCAINE HCL (PF) 2 % IJ SOLN
INTRAMUSCULAR | Status: AC
  Filled 2017-05-31: qty 10

## 2017-05-31 MED ORDER — SODIUM HYALURONATE (VISCOSUP) 20 MG/2ML IX SOSY
2.0000 mL | PREFILLED_SYRINGE | Freq: Once | INTRA_ARTICULAR | Status: AC
Start: 2017-05-31 — End: 2017-05-31
  Administered 2017-05-31: 2 mL via INTRA_ARTICULAR
  Filled 2017-05-31: qty 2

## 2017-05-31 MED ORDER — OXYCODONE HCL 5 MG PO TABS: 5.0000 mg | ORAL_TABLET | ORAL | 0 refills | Status: DC | PRN

## 2017-05-31 MED ORDER — OXYCODONE HCL 5 MG PO TABS: 5.0000 mg | ORAL_TABLET | Freq: Four times a day (QID) | ORAL | 0 refills | Status: DC | PRN

## 2017-05-31 MED ORDER — MORPHINE SULFATE ER 15 MG PO TBCR: 15.0000 mg | EXTENDED_RELEASE_TABLET | Freq: Two times a day (BID) | ORAL | 0 refills | Status: DC

## 2017-05-31 MED ORDER — SODIUM HYALURONATE (VISCOSUP) 20 MG/2ML IX SOSY
2.0000 mL | PREFILLED_SYRINGE | Freq: Once | INTRA_ARTICULAR | Status: AC
  Administered 2017-05-31: 2 mL via INTRA_ARTICULAR
  Filled 2017-05-31: qty 2

## 2017-05-31 MED ORDER — ROPIVACAINE HCL 2 MG/ML IJ SOLN
INTRAMUSCULAR | Status: AC
Start: 2017-05-31 — End: ?
  Filled 2017-05-31: qty 10

## 2017-05-31 MED ORDER — ROPIVACAINE HCL 2 MG/ML IJ SOLN
2.0000 mL | Freq: Once | INTRAMUSCULAR | Status: AC
  Administered 2017-05-31: 10 mL via INTRA_ARTICULAR

## 2017-05-31 MED ORDER — LIDOCAINE HCL 2 % IJ SOLN
20.0000 mL | Freq: Once | INTRAMUSCULAR | Status: AC
  Administered 2017-05-31: 200 mg

## 2017-05-31 NOTE — Patient Instructions (Addendum)
____________________________________________________________________________You have been given 3 prescriptions for oxycodone and 3 scripts for Morphine_today.  You have been ordered a LESI and have been given instructions for procedure without sedation.  _______________  Post-Procedure instructions Instructions:  Apply ice: Fill a plastic sandwich bag with crushed ice. Cover it with a small towel and apply to injection site. Apply for 15 minutes then remove x 15 minutes. Repeat sequence on day of procedure, until you go to bed. The purpose is to minimize swelling and discomfort after procedure.  Apply heat: Apply heat to procedure site starting the day following the procedure. The purpose is to treat any soreness and discomfort from the procedure.  Food intake: Start with clear liquids (like water) and advance to regular food, as tolerated.   Physical activities: Keep activities to a minimum for the first 8 hours after the procedure.   Driving: If you have received any sedation, you are not allowed to drive for 24 hours after your procedure.  Blood thinner: Restart your blood thinner 6 hours after your procedure. (Only for those taking blood thinners)  Insulin: As soon as you can eat, you may resume your normal dosing schedule. (Only for those taking insulin)  Infection prevention: Keep procedure site clean and dry.  Post-procedure Pain Diary: Extremely important that this be done correctly and accurately. Recorded information will be used to determine the next step in treatment.  Pain evaluated is that of treated area only. Do not include pain from an untreated area.  Complete every hour, on the hour, for the initial 8 hours. Set an alarm to help you do this part accurately.  Do not go to sleep and have it completed later. It will not be accurate.  Follow-up appointment: Keep your follow-up appointment after the procedure. Usually 2 weeks for most procedures. (6 weeks in the case of  radiofrequency.) Bring you pain diary.  Expect:  From numbing medicine (AKA: Local Anesthetics): Numbness or decrease in pain.  Onset: Full effect within 15 minutes of injected.  Duration: It will depend on the type of local anesthetic used. On the average, 1 to 8 hours.   From steroids: Decrease in swelling or inflammation. Once inflammation is improved, relief of the pain will follow.  Onset of benefits: Depends on the amount of swelling present. The more swelling, the longer it will take for the benefits to be seen. In some cases, up to 10 days.  Duration: Steroids will stay in the system x 2 weeks. Duration of benefits will depend on multiple posibilities including persistent irritating factors.  From procedure: Some discomfort is to be expected once the numbing medicine wears off. This should be minimal if ice and heat are applied as instructed. Call if:  You experience numbness and weakness that gets worse with time, as opposed to wearing off.  New onset bowel or bladder incontinence. (Spinal procedures only)  Emergency Numbers:  Durning business hours (Monday - Thursday, 8:00 AM - 4:00 PM) (Friday, 9:00 AM - 12:00 Noon): [phone]  After hours: [phone] ____________________________________________________________________________________________  Preparing for your procedure (without sedation) Instructions: . Oral Intake: Do not eat or drink anything for at least 3 hours prior to your procedure. . Transportation: Unless otherwise stated by your physician, you may drive yourself after the procedure. . Blood Pressure Medicine: Take your blood pressure medicine with a sip of water the morning of the procedure. . Insulin: Take only  of your normal insulin dose. . Preventing infections: Shower with an antibacterial soap  the morning of your procedure. . Build-up your immune system: Take 1000 mg of Vitamin C with every meal (3 times a day) the day prior to your  procedure. . Pregnancy: If you are pregnant, call and cancel the procedure. . Sickness: If you have a cold, fever, or any active infections, call and cancel the procedure. . Arrival: You must be in the facility at least 30 minutes prior to your scheduled procedure. . Children: Do not bring any children with you. . Dress appropriately: Bring dark clothing that you would not mind if they get stained. . Valuables: Do not bring any jewelry or valuables. Procedure appointments are reserved for interventional treatments only. Marland Kitchen No Prescription Refills. . No medication changes will be discussed during procedure appointments. No disability issues will be discussed.Epidural Steroid Injection Patient Information  Description: The epidural space surrounds the nerves as they exit the spinal cord.  In some patients, the nerves can be compressed and inflamed by a bulging disc or a tight spinal canal (spinal stenosis).  By injecting steroids into the epidural space, we can bring irritated nerves into direct contact with a potentially helpful medication.  These steroids act directly on the irritated nerves and can reduce swelling and inflammation which often leads to decreased pain.  Epidural steroids may be injected anywhere along the spine and from the neck to the low back depending upon the location of your pain.   After numbing the skin with local anesthetic (like Novocaine), a small needle is passed into the epidural space slowly.  You may experience a sensation of pressure while this is being done.  The entire block usually last less than 10 minutes.  Conditions which may be treated by epidural steroids:   Low back and leg pain  Neck and arm pain  Spinal stenosis  Post-laminectomy syndrome  Herpes zoster (shingles) pain  Pain from compression fractures  Preparation for the injection:  1. Do not eat any solid food or dairy products within 8 hours of your appointment.  2. You may drink clear  liquids up to 3 hours before appointment.  Clear liquids include water, black coffee, juice or soda.  No milk or cream please. 3. You may take your regular medication, including pain medications, with a sip of water before your appointment  Diabetics should hold regular insulin (if taken separately) and take 1/2 normal NPH dos the morning of the procedure.  Carry some sugar containing items with you to your appointment. 4. A driver must accompany you and be prepared to drive you home after your procedure.  5. Bring all your current medications with your. 6. An IV may be inserted and sedation may be given at the discretion of the physician.   7. A blood pressure cuff, EKG and other monitors will often be applied during the procedure.  Some patients may need to have extra oxygen administered for a short period. 8. You will be asked to provide medical information, including your allergies, prior to the procedure.  We must know immediately if you are taking blood thinners (like Coumadin/Warfarin)  Or if you are allergic to IV iodine contrast (dye). We must know if you could possible be pregnant.  Possible side-effects:  Bleeding from needle site  Infection (rare, may require surgery)  Nerve injury (rare)  Numbness & tingling (temporary)  Difficulty urinating (rare, temporary)  Spinal headache ( a headache worse with upright posture)  Light -headedness (temporary)  Pain at injection site (several days)  Decreased blood  pressure (temporary)  Weakness in arm/leg (temporary)  Pressure sensation in back/neck (temporary)  Call if you experience:  Fever/chills associated with headache or increased back/neck pain.  Headache worsened by an upright position.  New onset weakness or numbness of an extremity below the injection site  Hives or difficulty breathing (go to the emergency room)  Inflammation or drainage at the infection site  Severe back/neck pain  Any new symptoms which are  concerning to you  Please note:  Although the local anesthetic injected can often make your back or neck feel good for several hours after the injection, the pain will likely return.  It takes 3-7 days for steroids to work in the epidural space.  You may not notice any pain relief for at least that one week.  If effective, we will often do a series of three injections spaced 3-6 weeks apart to maximally decrease your pain.  After the initial series, we generally will wait several months before considering a repeat injection of the same type.  If you have any questions, please call [phone] [facility]

## 2017-05-31 NOTE — Progress Notes (Signed)
Nursing Pain Medication Assessment:  Safety precautions to be maintained throughout the outpatient stay will include: orient to surroundings, keep bed in low position, maintain call bell within reach at all times, provide assistance with transfer out of bed and ambulation.  Medication Inspection Compliance: Pill count conducted under aseptic conditions, in front of the patient. Neither the pills nor the bottle was removed from the patient's sight at any time. Once count was completed pills were immediately returned to the patient in their original bottle.  Medication #1: Morphine ER (MSContin) Pill/Patch Count: 48 of 60 pills remain Pill/Patch Appearance: Markings consistent with prescribed medication Bottle Appearance: Standard pharmacy container. Clearly labeled. Filled Date: 09 / 01 / 2018 Last Medication intake:  Today  Medication #2: Oxycodone ER (OxyContin) Pill/Patch Count: 63 of 90 pills remain Pill/Patch Appearance: Markings consistent with prescribed medication Bottle Appearance: Standard pharmacy container. Clearly labeled. Filled Date: 609 / 4 / 2018 Last Medication intake:  Today

## 2017-05-31 NOTE — Progress Notes (Signed)
Patient's Name: James Friedman  MRN: [id number]  Referring Provider: Delano MetzNaveira, Tomoki Lucken, MD  DOB: 03/18/1946  PCP: Bobbye Mortoneilly, Sharon A, MD  DOS: 05/31/2017  Note by: Oswaldo DoneFrancisco A Alyn Jurney, MD  Service setting: Ambulatory outpatient  Specialty: Interventional Pain Management  Patient type: Established  Location: [facility] (AMB) Pain Management Facility  Visit type: Interventional Procedure   Primary Reason for Visit: Interventional Pain Management Treatment. CC: Knee Pain and Back Pain (upper)  Procedure:  Anesthesia, Analgesia, Anxiolysis:  Type: Therapeutic Intra-Articular Hyalgan Knee Injection #5 Region: Lateral infrapatellar Knee Region Level: Knee Joint Laterality: Bilateral  Type: Local Anesthesia Local Anesthetic: Lidocaine 1% Route: Infiltration ([place]/IM) IV Access: Declined Sedation: Declined  Indication(s): Analgesia          Indications: 1. Chronic knee pain (Bilateral)   2. Primary osteoarthritis of left knee   3. Primary osteoarthritis of right knee   4. Osteoarthritis of knee (Bilateral)   5. Chronic pain syndrome   6. Chronic lower extremity pain (Location of Secondary source of pain) (Bilateral) (R>L)   7. Chronic lumbar radicular pain (Location of Secondary source of pain) (Bilateral) (R>L) (S1 Dermatome on the left)   8. 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 is a 71 y.o. (year old), male patient, seen today for interventional treatment. He  has a past surgical history that includes Knee surgery (Bilateral); Neck surgery; Carpal tunnel release; Shoulder surgery (Right); Cataract extraction, extracapsular (Left, 08/04/2015); and Cataract extraction w/PHACO (Right, 04/19/2016). [name] has a current medication list which includes the following prescription(s): acetaminophen, albuterol, aluminum hydroxide, amlodipine, antiseptic oral rinse, aspirin,  atorvastatin, azelastine, capsaicin, carboxymethylcellul-glycerin, cetirizine, chlorpheniramine, clotrimazole, diclofenac sodium, docusate sodium, duloxetine, ergocalciferol, fluticasone, gabapentin, glucagon, hydroxyzine, insulin aspart, insulin detemir, lidocaine, lisinopril, magnesium, metformin, methocarbamol, metoprolol tartrate, mometasone, morphine, multivitamin, naloxone, naproxen, fish oil, omeprazole, oxycodone, oxygen-helium, pantoprazole, polyethylene glycol powder, promethazine, senna, sitagliptin-metformin, testosterone cypionate, trazodone, vitamin c, vitamin a & d, morphine, morphine, oxycodone, and oxycodone. His primarily concern today is the Knee Pain and Back Pain (upper)  Initial Vital Signs: Blood pressure (!) 150/90, pulse 80, temperature 98.2 F (36.8 C), resp. rate 18, height 5\' 9"  (1.753 m), weight 282 lb (127.9 kg), SpO2 100 %. BMI: Estimated body mass index is 41.64 kg/m as calculated from the following:   Height as of this encounter: 5\' 9"  (1.753 m).   Weight as of this encounter: 282 lb (127.9 kg).  Risk Assessment: Allergies: Reviewed. He has No Known Allergies.  Allergy Precautions: None required Coagulopathies: Reviewed. None identified.  Blood-thinner therapy: None at this time Active Infection(s): Reviewed. None identified. [name] is afebrile  Site Confirmation: [name] was asked to confirm the procedure and laterality before marking the site Procedure checklist: Completed Consent: Before the procedure and under the influence of no sedative(s), amnesic(s), or anxiolytics, the patient was informed of the treatment options, risks and possible complications. To fulfill our ethical and legal obligations, as recommended by the American Medical Association's Code of Ethics, I have informed the patient of my clinical impression; the nature and purpose of the treatment or procedure; the risks, benefits, and possible complications of the intervention; the  alternatives, including doing nothing; the risk(s) and benefit(s) of the alternative treatment(s) or procedure(s); and the risk(s) and benefit(s) of doing nothing. The patient was provided information about the general risks and possible complications associated with the procedure. These may include, but are not limited  to: failure to achieve desired goals, infection, bleeding, organ or nerve damage, allergic reactions, paralysis, and death. In addition, the patient was informed of those risks and complications associated to the procedure, such as failure to decrease pain; infection; bleeding; organ or nerve damage with subsequent damage to sensory, motor, and/or autonomic systems, resulting in permanent pain, numbness, and/or weakness of one or several areas of the body; allergic reactions; (i.e.: anaphylactic reaction); and/or death. Furthermore, the patient was informed of those risks and complications associated with the medications. These include, but are not limited to: allergic reactions (i.e.: anaphylactic or anaphylactoid reaction(s)); adrenal axis suppression; blood sugar elevation that in diabetics may result in ketoacidosis or comma; water retention that in patients with history of congestive heart failure may result in shortness of breath, pulmonary edema, and decompensation with resultant heart failure; weight gain; swelling or edema; medication-induced neural toxicity; particulate matter embolism and blood vessel occlusion with resultant organ, and/or nervous system infarction; and/or aseptic necrosis of one or more joints. Finally, the patient was informed that Medicine is not an exact science; therefore, there is also the possibility of unforeseen or unpredictable risks and/or possible complications that may result in a catastrophic outcome. The patient indicated having understood very clearly. We have given the patient no guarantees and we have made no promises. Enough time was given to the  patient to ask questions, all of which were answered to the patient's satisfaction. Mr. Bozzi has indicated that he wanted to continue with the procedure. Attestation: I, the ordering provider, attest that I have discussed with the patient the benefits, risks, side-effects, alternatives, likelihood of achieving goals, and potential problems during recovery for the procedure that I have provided informed consent. Date: 05/31/2017; Time: 10:22 AM  Pre-Procedure Preparation:  Monitoring: As per clinic protocol. Respiration, ETCO2, SpO2, BP, heart rate and rhythm monitor placed and checked for adequate function Safety Precautions: Patient was assessed for positional comfort and pressure points before starting the procedure. Time-out: I initiated and conducted the "Time-out" before starting the procedure, as per protocol. The patient was asked to participate by confirming the accuracy of the "Time Out" information. Verification of the correct person, site, and procedure were performed and confirmed by me, the nursing staff, and the patient. "Time-out" conducted as per Joint Commission's Universal Protocol (UP.01.01.01). "Time-out" Date & Time: 05/31/2017; 1024 hrs.  Description of Procedure Process:   Position: Sitting Target Area: Knee Joint Approach: Just above the Lateral tibial plateau, lateral to the infrapatellar tendon. Area Prepped: Entire knee area, from the mid-thigh to the mid-shin. Prepping solution: ChloraPrep (2% chlorhexidine gluconate and 70% isopropyl alcohol) Safety Precautions: Aspiration looking for blood return was conducted prior to all injections. At no point did we inject any substances, as a needle was being advanced. No attempts were made at seeking any paresthesias. Safe injection practices and needle disposal techniques used. Medications properly checked for expiration dates. SDV (single dose vial) medications used. Description of the Procedure: Protocol guidelines were  followed. The patient was placed in position over the fluoroscopy table. The target area was identified and the area prepped in the usual manner. Skin desensitized using vapocoolant spray. Skin & deeper tissues infiltrated with local anesthetic. Appropriate amount of time allowed to pass for local anesthetics to take effect. The procedure needles were then advanced to the target area. Proper needle placement secured. Negative aspiration confirmed. Solution injected in intermittent fashion, asking for systemic symptoms every 0.5cc of injectate. The needles were then removed and the area  cleansed, making sure to leave some of the prepping solution back to take advantage of its long term bactericidal properties. Vitals:   05/31/17 0924 05/31/17 0926 05/31/17 1024 05/31/17 1030  BP:  (!) 150/90 139/74 (!) 144/81  Pulse: 80  82 79  Resp: Temp: 98.2 F (36.8 C)     SpO2: 100%  93% 94%  Weight: 282 lb (127.9 kg)     Height:  (1.753 m)       Start Time: 1024 hrs. End Time: 1030 hrs. Materials:  Needle(s) Type: Regular needle Gauge: 22G Length: 3.5-in Medication(s): We administered ropivacaine (PF) 2 mg/mL (0.2%), Sodium Hyaluronate, Sodium Hyaluronate, and lidocaine. Please see chart orders for dosing details.  Imaging Guidance:  Type of Imaging Technique: None used Indication(s): N/A Exposure Time: No patient exposure Contrast: None used. Fluoroscopic Guidance: N/A Ultrasound Guidance: N/A Interpretation: N/A  Antibiotic Prophylaxis:  Indication(s): None identified Antibiotic given: None  Post-operative Assessment:  EBL: None Complications: No immediate post-treatment complications observed by team, or reported by patient. Note: The patient tolerated the entire procedure well. A repeat set of vitals were taken after the procedure and the patient was kept under observation following institutional policy, for this type of procedure. Post-procedural neurological assessment  was performed, showing return to baseline, prior to discharge. The patient was provided with post-procedure discharge instructions, including a section on how to identify potential problems. Should any problems arise concerning this procedure, the patient was given instructions to immediately contact us, at any time, without hesitation. In any case, we plan to contact the patient by telephone for a follow-up status report regarding this interventional procedure. Comments:  No additional relevant information.  Plan of Care  Disposition: Discharge home  Discharge Date & Time: 05/31/2017; 1031 hrs.   Physician-requested Follow-up:  Return in about 1 week (around 06/07/2017) for Procedure (with sedation): L2 TFESI (2).  Future Appointments Date Time Provider Department Center  06/07/2017 10:15 AM Delano Metz, MD [facility] None  06/27/2017 11:15 AM Delano Metz, MD [facility] None  08/30/2017 10:30 AM Barbette Merino, NP [facility] None   Imaging Orders  No imaging studies ordered today    Procedure Orders     Lumbar Transforaminal Epidural  Medications ordered for procedure: Meds ordered this encounter  Medications  . morphine (MS CONTIN) 15 MG 12 hr tablet    Sig: Take 1 tablet (15 mg total) by mouth every 12 (twelve) hours.    Dispense:  60 tablet    Refill:  0    Fill one day early if pharmacy is closed on scheduled refill date. Do not fill until: 06/07/2017 To last until: 07/07/2017    Order Specific Question:   Supervising Provider    Answer:   Delano Metz [phone]  . morphine (MS CONTIN) 15 MG 12 hr tablet    Sig: Take 1 tablet (15 mg total) by mouth every 12 (twelve) hours.    Dispense:  60 tablet    Refill:  0    Fill one day early if pharmacy is closed on scheduled refill date. Do not fill until: 07/07/2017 To last until: 08/06/2017    Order Specific Question:   Supervising Provider    Answer:   Delano Metz [phone]  . morphine (MS CONTIN) 15 MG 12  hr tablet    Sig: Take 1 tablet (15 mg total) by mouth every 12 (twelve) hours.    Dispense:  60 tablet    Refill:  0  Fill one day early if pharmacy is closed on scheduled refill date. Do not fill until:08/06/2017 04/08/17 To last until: 09/05/2017    Order Specific Question:   Supervising Provider    Answer:   Delano Metz [phone]  . oxyCODONE (OXY IR/ROXICODONE) 5 MG immediate release tablet    Sig: Take 1 tablet (5 mg total) by mouth every 4 (four) hours as needed for severe pain.    Dispense:  120 tablet    Refill:  0    Fill one day early if pharmacy is closed on scheduled refill date. Do not fill until: 06/07/2017 To last until: 07/07/2017    Order Specific Question:   Supervising Provider    Answer:   Delano Metz [phone]  . oxyCODONE (OXY IR/ROXICODONE) 5 MG immediate release tablet    Sig: Take 1 tablet (5 mg total) by mouth every 6 (six) hours as needed for severe pain.    Dispense:  120 tablet    Refill:  0    Fill one day early if pharmacy is closed on scheduled refill date. Do not fill until: 07/07/2017 To last until: 08/06/2017    Order Specific Question:   Supervising Provider    Answer:   Delano Metz [phone]  . oxyCODONE (OXY IR/ROXICODONE) 5 MG immediate release tablet    Sig: Take 1 tablet (5 mg total) by mouth every 4 (four) hours as needed for severe pain.    Dispense:  120 tablet    Refill:  0    Fill one day early if pharmacy is closed on scheduled refill date. Do not fill until: 08/06/2017 To last until: 09/05/2017    Order Specific Question:   Supervising Provider    Answer:   Delano Metz [phone]  . ropivacaine (PF) 2 mg/mL (0.2%) (NAROPIN) injection 2 mL  . Sodium Hyaluronate SOSY 2 mL  . Sodium Hyaluronate SOSY 2 mL  . lidocaine (XYLOCAINE) 2 % (with pres) injection 400 mg   Medications administered: We administered ropivacaine (PF) 2 mg/mL (0.2%), Sodium Hyaluronate, Sodium Hyaluronate, and lidocaine.  See the medical  record for exact dosing, route, and time of administration.  New Prescriptions   No medications on file   Primary Care Physician: Bobbye Morton, MD Location: [facility] Note by: Oswaldo Done, MD Date: 05/31/2017; Time: 1:15 PM  Disclaimer:  Medicine is not an Visual merchandiser. The only guarantee in medicine is that nothing is guaranteed. It is important to note that the decision to proceed with this intervention was based on the information collected from the patient. The Data and conclusions were drawn from the patient's questionnaire, the interview, and the physical examination. Because the information was provided in large part by the patient, it cannot be guaranteed that it has not been purposely or unconsciously manipulated. Every effort has been made to obtain as much relevant data as possible for this evaluation. It is important to note that the conclusions that lead to this procedure are derived in large part from the available data. Always take into account that the treatment will also be dependent on availability of resources and existing treatment guidelines, considered by other Pain Management Practitioners as being common knowledge and practice, at the time of the intervention. For Medico-Legal purposes, it is also important to point out that variation in procedural techniques and pharmacological choices are the acceptable norm. The indications, contraindications, technique, and results of the above procedure should only be interpreted and judged by a Board-Certified  Interventional Pain Specialist with extensive familiarity and expertise in the same exact procedure and technique.

## 2017-06-01 ENCOUNTER — Telehealth: Payer: Self-pay

## 2017-06-01 NOTE — Telephone Encounter (Signed)
No answer- Left message to call if needed 

## 2017-06-02 ENCOUNTER — Ambulatory Visit: Payer: Self-pay | Admitting: Nurse Practitioner

## 2017-06-07 ENCOUNTER — Encounter: Payer: Self-pay | Admitting: Pain Medicine

## 2017-06-07 ENCOUNTER — Ambulatory Visit
Admission: RE | Admit: 2017-06-07 | Discharge: 2017-06-07 | Disposition: A | Discharge status: Home / Self Care | Payer: Medicare (Managed Care) | Source: Ambulatory Visit | Attending: Pain Medicine | Admitting: Pain Medicine

## 2017-06-07 ENCOUNTER — Ambulatory Visit (HOSPITAL_BASED_OUTPATIENT_CLINIC_OR_DEPARTMENT_OTHER): Payer: Medicare (Managed Care) | Admitting: Pain Medicine

## 2017-06-07 VITALS — BP 140/95 | HR 81 | Temp 98.2°F | Resp 16 | Ht 69.0 in | Wt 282.0 lb

## 2017-06-07 DIAGNOSIS — M48062 Spinal stenosis, lumbar region with neurogenic claudication: Secondary | ICD-10-CM | POA: Insufficient documentation

## 2017-06-07 DIAGNOSIS — M47816 Spondylosis without myelopathy or radiculopathy, lumbar region: Secondary | ICD-10-CM | POA: Insufficient documentation

## 2017-06-07 DIAGNOSIS — M9983 Other biomechanical lesions of lumbar region: Secondary | ICD-10-CM | POA: Diagnosis present

## 2017-06-07 DIAGNOSIS — M48061 Spinal stenosis, lumbar region without neurogenic claudication: Secondary | ICD-10-CM

## 2017-06-07 DIAGNOSIS — M5416 Radiculopathy, lumbar region: Secondary | ICD-10-CM | POA: Insufficient documentation

## 2017-06-07 MED ORDER — DEXAMETHASONE SODIUM PHOSPHATE 10 MG/ML IJ SOLN
10.0000 mg | Freq: Once | INTRAMUSCULAR | Status: AC
  Administered 2017-06-07: 10 mg
  Filled 2017-06-07: qty 1

## 2017-06-07 MED ORDER — IOPAMIDOL (ISOVUE-M 200) INJECTION 41%
10.0000 mL | Freq: Once | INTRAMUSCULAR | Status: AC
  Administered 2017-06-07: 10 mL via EPIDURAL
  Filled 2017-06-07: qty 10

## 2017-06-07 MED ORDER — SODIUM CHLORIDE 0.9% FLUSH
1.0000 mL | Freq: Once | INTRAVENOUS | Status: AC
  Administered 2017-06-07: 1 mL

## 2017-06-07 MED ORDER — ROPIVACAINE HCL 2 MG/ML IJ SOLN
1.0000 mL | Freq: Once | INTRAMUSCULAR | Status: AC
  Administered 2017-06-07: 1 mL via EPIDURAL
  Filled 2017-06-07: qty 10

## 2017-06-07 MED ORDER — LIDOCAINE HCL 2 % IJ SOLN
10.0000 mL | Freq: Once | INTRAMUSCULAR | Status: AC
Start: 2017-06-07 — End: 2017-06-07
  Administered 2017-06-07: 400 mg
  Filled 2017-06-07: qty 20

## 2017-06-07 NOTE — Progress Notes (Signed)
Patient's Name: James Friedman  MRN: [id number]  Referring Provider: Bobbye Morton, MD  DOB: Oct 29, 1945  PCP: Bobbye Morton, MD  DOS: 06/07/2017  Note by: Oswaldo Done, MD  Service setting: Ambulatory outpatient  Specialty: Interventional Pain Management  Patient type: Established  Location: [facility] (AMB) Pain Management Facility  Visit type: Interventional Procedure   Primary Reason for Visit: Interventional Pain Management Treatment. CC: Back Pain (lower)  Procedure:  Anesthesia, Analgesia, Anxiolysis:  Type: Therapeutic Trans-Foraminal Epidural Steroid Injection Region: Posterolateral Lumbosacral Level: L2-3 Level Laterality: Right-Sided Paravertebral   Note: After the procedure, the patient developed bilateral lower extremity numbness and weakness, suggesting the possibility of intrathecal spread of the local anesthetic. He was kept in the clinic for observation.  Type: Local Anesthesia Local Anesthetic: Lidocaine 1% Route: Infiltration (Wagon Wheel/IM) IV Access: Declined Sedation: Declined  Indication(s): Analgesia          Indications: 1. Chronic lumbar radiculopathy   2. 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 Spinal stenosis of lumbar region with neurogenic claudication   4. Lumbar spondylosis    Pain Score: Pre-procedure: 7 /10 Post-procedure: 0-No pain/10  Pre-op Assessment:  James Friedman is a 71 y.o. (year old), male patient, seen today for interventional treatment. He  has a past surgical history that includes Knee surgery (Bilateral); Neck surgery; Carpal tunnel release; Shoulder surgery (Right); Cataract extraction, extracapsular (Left, 08/04/2015); and Cataract extraction w/PHACO (Right, 04/19/2016). James Friedman has a current medication list which includes the following prescription(s): acetaminophen, albuterol, aluminum hydroxide, amlodipine, antiseptic oral rinse, aspirin, atorvastatin, azelastine, capsaicin, carboxymethylcellul-glycerin,  cetirizine, chlorpheniramine, clotrimazole, diclofenac sodium, docusate sodium, duloxetine, ergocalciferol, fluticasone, gabapentin, glucagon, hydroxyzine, insulin aspart, insulin detemir, lidocaine, lisinopril, magnesium, metformin, methocarbamol, metoprolol tartrate, mometasone, morphine, morphine, morphine, multivitamin, naloxone, naproxen, fish oil, omeprazole, oxycodone, oxycodone, oxycodone, oxygen-helium, pantoprazole, polyethylene glycol powder, promethazine, senna, sitagliptin-metformin, testosterone cypionate, trazodone, vitamin c, and vitamin a & d. His primarily concern today is the Back Pain (lower)  Initial Vital Signs: Blood pressure 110/80, pulse 81, temperature 98.2 F (36.8 C), temperature source Oral, resp. rate 18, height  (1.753 m), weight 282 lb (127.9 kg), SpO2 98 %. BMI: Estimated body mass index is 41.64 kg/m as calculated from the following:   Height as of this encounter:  (1.753 m).   Weight as of this encounter: 282 lb (127.9 kg).  Risk Assessment: Allergies: Reviewed. He has No Known Allergies.  Allergy Precautions: None required Coagulopathies: Reviewed. None identified.  Blood-thinner therapy: None at this time Active Infection(s): Reviewed. None identified. James Friedman is afebrile  Site Confirmation: James Friedman was asked to confirm the procedure and laterality before marking the site Procedure checklist: Completed Consent: Before the procedure and under the influence of no sedative(s), amnesic(s), or anxiolytics, the patient was informed of the treatment options, risks and possible complications. To fulfill our ethical and legal obligations, as recommended by the American Medical Association's Code of Ethics, I have informed the patient of my clinical impression; the nature and purpose of the treatment or procedure; the risks, benefits, and possible complications of the intervention; the alternatives, including doing nothing; the risk(s) and benefit(s) of  the alternative treatment(s) or procedure(s); and the risk(s) and benefit(s) of doing nothing. The patient was provided information about the general risks and possible complications associated with the procedure. These may include, but are not limited to: failure to achieve desired goals, infection, bleeding, organ or nerve damage, allergic reactions, paralysis, and death. In addition, the patient  was informed of those risks and complications associated to Spine-related procedures, such as failure to decrease pain; infection (i.e.: Meningitis, epidural or intraspinal abscess); bleeding (i.e.: epidural hematoma, subarachnoid hemorrhage, or any other type of intraspinal or peri-dural bleeding); organ or nerve damage (i.e.: Any type of peripheral nerve, nerve root, or spinal cord injury) with subsequent damage to sensory, motor, and/or autonomic systems, resulting in permanent pain, numbness, and/or weakness of one or several areas of the body; allergic reactions; (i.e.: anaphylactic reaction); and/or death. Furthermore, the patient was informed of those risks and complications associated with the medications. These include, but are not limited to: allergic reactions (i.e.: anaphylactic or anaphylactoid reaction(s)); adrenal axis suppression; blood sugar elevation that in diabetics may result in ketoacidosis or comma; water retention that in patients with history of congestive heart failure may result in shortness of breath, pulmonary edema, and decompensation with resultant heart failure; weight gain; swelling or edema; medication-induced neural toxicity; particulate matter embolism and blood vessel occlusion with resultant organ, and/or nervous system infarction; and/or aseptic necrosis of one or more joints. Finally, the patient was informed that Medicine is not an exact science; therefore, there is also the possibility of unforeseen or unpredictable risks and/or possible complications that may result in a  catastrophic outcome. The patient indicated having understood very clearly. We have given the patient no guarantees and we have made no promises. Enough time was given to the patient to ask questions, all of which were answered to the patient's satisfaction. Mr. Paskett has indicated that he wanted to continue with the procedure. Attestation: I, the ordering provider, attest that I have discussed with the patient the benefits, risks, side-effects, alternatives, likelihood of achieving goals, and potential problems during recovery for the procedure that I have provided informed consent. Date: 06/07/2017; Time: 10:50 AM  Pre-Procedure Preparation:  Monitoring: As per clinic protocol. Respiration, ETCO2, SpO2, BP, heart rate and rhythm monitor placed and checked for adequate function Safety Precautions: Patient was assessed for positional comfort and pressure points before starting the procedure. Time-out: I initiated and conducted the "Time-out" before starting the procedure, as per protocol. The patient was asked to participate by confirming the accuracy of the "Time Out" information. Verification of the correct person, site, and procedure were performed and confirmed by me, the nursing staff, and the patient. "Time-out" conducted as per Joint Commission's Universal Protocol (UP.01.01.01). "Time-out" Date & Time: 06/07/2017; 1150 hrs.  Description of Procedure Process:   Position: Prone Target Area: For Epidural Steroid injection(s), the target area is the  interlaminar space, initially targeting the lower border of the superior vertebral body lamina. Approach: Paravertebral approach. Area Prepped: Entire Posterior Lumbosacral Area Prepping solution: ChloraPrep (2% chlorhexidine gluconate and 70% isopropyl alcohol) Safety Precautions: Aspiration looking for blood return was conducted prior to all injections. At no point did we inject any substances, as a needle was being advanced. No attempts were made  at seeking any paresthesias. Safe injection practices and needle disposal techniques used. Medications properly checked for expiration dates. SDV (single dose vial) medications used. Description of the Procedure: Protocol guidelines were followed. The patient was placed in position over the procedure table. The target area was identified and the area prepped in the usual manner. Skin desensitized using vapocoolant spray. Skin & deeper tissues infiltrated with local anesthetic. Appropriate amount of time allowed to pass for local anesthetics to take effect. The procedure needles were then advanced to the target area. Proper needle placement secured. Negative aspiration confirmed. Solution injected in  intermittent fashion, asking for systemic symptoms every 0.5cc of injectate. The needles were then removed and the area cleansed, making sure to leave some of the prepping solution back to take advantage of its long term bactericidal properties. Vitals:   06/07/17 1140 06/07/17 1149 06/07/17 1154 06/07/17 1157  BP: 106/65 103/75 (!) 144/95 (!) 140/95  Pulse:      Resp: Temp:      TempSrc:      SpO2: 95% 95% 95% 95%  Weight:      Height:        Start Time: 1150 hrs. End Time: 1156 hrs. Materials:  Needle(s) Type: Regular needle Gauge: 22G Length: 3.5-in Medication(s): We administered lidocaine, iopamidol, dexamethasone, ropivacaine (PF) 2 mg/mL (0.2%), and sodium chloride flush. Please see chart orders for dosing details.  Imaging Guidance (Spinal):  Type of Imaging Technique: Fluoroscopy Guidance (Spinal) Indication(s): Assistance in needle guidance and placement for procedures requiring needle placement in or near specific anatomical locations not easily accessible without such assistance. Exposure Time: Please see nurses notes. Contrast: Before injecting any contrast, we confirmed that the patient did not have an allergy to iodine, shellfish, or radiological contrast. Once  satisfactory needle placement was completed at the desired level, radiological contrast was injected. Contrast injected under live fluoroscopy. No contrast complications. See chart for type and volume of contrast used. Fluoroscopic Guidance: I was personally present during the use of fluoroscopy. "Tunnel Vision Technique" used to obtain the best possible view of the target area. Parallax error corrected before commencing the procedure. "Direction-depth-direction" technique used to introduce the needle under continuous pulsed fluoroscopy. Once target was reached, antero-posterior, oblique, and lateral fluoroscopic projection used confirm needle placement in all planes. Images permanently stored in EMR. Interpretation: I personally interpreted the imaging intraoperatively. Adequate needle placement confirmed in multiple planes. Appropriate spread of contrast into desired area was observed. No evidence of afferent or efferent intravascular uptake. No intrathecal or subarachnoid spread observed. Permanent images saved into the patient's record.  Antibiotic Prophylaxis:  Indication(s): None identified Antibiotic given: None  Post-operative Assessment:  EBL: None Complications: After the procedure, the patient developed bilateral lower extremity numbness and weakness, suggesting the possibility of intrathecal spread of the local anesthetic. He was kept in the clinic for observation. the patient was kept in the clinic until 1350 hrs. at which time we repeated the neurological evaluation on the patient had fully recovered from his block. He was able to stand and walk by himself. Note: The patient tolerated the entire procedure well. A repeat set of vitals were taken after the procedure and the patient was kept under observation following institutional policy, for this type of procedure. The patient was provided with post-procedure discharge instructions, including a section on how to identify potential problems.  Should any problems arise concerning this procedure, the patient was given instructions to immediately contact us, at any time, without hesitation. In any case, we plan to contact the patient by telephone for a follow-up status report regarding this interventional procedure. Comments:  No additional relevant information.  Plan of Care  Disposition: Discharge home  Discharge Date & Time: 06/07/2017; 1350 hrs.   Physician-requested Follow-up:  Return for post-procedure eval by Dr. Laban Emperor in 2 weeks.  Future Appointments Date Time Provider Department Center  06/27/2017 11:15 AM Delano Metz, MD [facility] None  08/30/2017 10:30 AM Barbette Merino, NP [facility] None    Imaging Orders     DG C-Arm 1-60 Min-No Report  Procedure Orders     Lumbar Transforaminal Epidural  Medications ordered for procedure: Meds ordered this encounter  Medications  . lidocaine (XYLOCAINE) 2 % (with pres) injection 200 mg  . iopamidol (ISOVUE-M) 41 % intrathecal injection 10 mL  . dexamethasone (DECADRON) injection 10 mg  . ropivacaine (PF) 2 mg/mL (0.2%) (NAROPIN) injection 1 mL  . sodium chloride flush (NS) 0.9 % injection 1 mL   Medications administered: We administered lidocaine, iopamidol, dexamethasone, ropivacaine (PF) 2 mg/mL (0.2%), and sodium chloride flush.  See the medical record for exact dosing, route, and time of administration.  New Prescriptions   No medications on file   Primary Care Physician: Bobbye Morton, MD Location: [facility] Note by: Oswaldo Done, MD Date: 06/07/2017; Time: 1:51 PM  Disclaimer:  Medicine is not an Visual merchandiser. The only guarantee in medicine is that nothing is guaranteed. It is important to note that the decision to proceed with this intervention was based on the information collected from the patient. The Data and conclusions were drawn from the patient's questionnaire, the interview, and the physical  examination. Because the information was provided in large part by the patient, it cannot be guaranteed that it has not been purposely or unconsciously manipulated. Every effort has been made to obtain as much relevant data as possible for this evaluation. It is important to note that the conclusions that lead to this procedure are derived in large part from the available data. Always take into account that the treatment will also be dependent on availability of resources and existing treatment guidelines, considered by other Pain Management Practitioners as being common knowledge and practice, at the time of the intervention. For Medico-Legal purposes, it is also important to point out that variation in procedural techniques and pharmacological choices are the acceptable norm. The indications, contraindications, technique, and results of the above procedure should only be interpreted and judged by a Board-Certified Interventional Pain Specialist with extensive familiarity and expertise in the same exact procedure and technique.

## 2017-06-07 NOTE — Patient Instructions (Addendum)
____________________________________________________________________________________________  Post-Procedure instructions Instructions:  Apply ice: Fill a plastic sandwich bag with crushed ice. Cover it with a small towel and apply to injection site. Apply for 15 minutes then remove x 15 minutes. Repeat sequence on day of procedure, until you go to bed. The purpose is to minimize swelling and discomfort after procedure.  Apply heat: Apply heat to procedure site starting the day following the procedure. The purpose is to treat any soreness and discomfort from the procedure.  Food intake: Start with clear liquids (like water) and advance to regular food, as tolerated.   Physical activities: Keep activities to a minimum for the first 8 hours after the procedure.   Driving: If you have received any sedation, you are not allowed to drive for 24 hours after your procedure.  Blood thinner: Restart your blood thinner 6 hours after your procedure. (Only for those taking blood thinners)  Insulin: As soon as you can eat, you may resume your normal dosing schedule. (Only for those taking insulin)  Infection prevention: Keep procedure site clean and dry.  Post-procedure Pain Diary: Extremely important that this be done correctly and accurately. Recorded information will be used to determine the next step in treatment.  Pain evaluated is that of treated area only. Do not include pain from an untreated area.  Complete every hour, on the hour, for the initial 8 hours. Set an alarm to help you do this part accurately.  Do not go to sleep and have it completed later. It will not be accurate.  Follow-up appointment: Keep your follow-up appointment after the procedure. Usually 2 weeks for most procedures. (6 weeks in the case of radiofrequency.) Bring you pain diary.  Expect:  From numbing medicine (AKA: Local Anesthetics): Numbness or decrease in pain.  Onset: Full effect within 15 minutes of  injected.  Duration: It will depend on the type of local anesthetic used. On the average, 1 to 8 hours.   From steroids: Decrease in swelling or inflammation. Once inflammation is improved, relief of the pain will follow.  Onset of benefits: Depends on the amount of swelling present. The more swelling, the longer it will take for the benefits to be seen. In some cases, up to 10 days.  Duration: Steroids will stay in the system x 2 weeks. Duration of benefits will depend on multiple posibilities including persistent irritating factors.  From procedure: Some discomfort is to be expected once the numbing medicine wears off. This should be minimal if ice and heat are applied as instructed. Call if:  You experience numbness and weakness that gets worse with time, as opposed to wearing off.  New onset bowel or bladder incontinence. (Spinal procedures only)  Emergency Numbers:  Durning business hours (Monday - Thursday, 8:00 AM - 4:00 PM) (Friday, 9:00 AM - 12:00 Noon): [phone]  After hours: [phone] ____________________________________________________________________________________________   Selective Nerve Root Block Patient Information  Description: Specific nerve roots exit the spinal canal and these nerves can be compressed and inflamed by a bulging disc and bone spurs.  By injecting steroids on the nerve root, we can potentially decrease the inflammation surrounding these nerves, which often leads to decreased pain.  Also, by injecting local anesthesia on the nerve root, this can provide us helpful information to give to your referring doctor if it decreases your pain.  Selective nerve root blocks can be done along the spine from the neck to the low back depending on the location of your pain.     After numbing the skin with local anesthesia, a small needle is passed to the nerve root and the position of the needle is verified using x-ray pictures.  After the needle is in  correct position, we then deposit the medication.  You may experience a pressure sensation while this is being done.  The entire block usually lasts less than 15 minutes.  Conditions that may be treated with selective nerve root blocks:  Low back and leg pain  Spinal stenosis  Diagnostic block prior to potential surgery  Neck and arm pain  Post laminectomy syndrome  Preparation for the injection:  1. Do not eat any solid food or dairy products within 8 hours of your appointment. 2. You may drink clear liquids up to 3 hours before an appointment.  Clear liquids include water, black coffee, juice or soda.  No milk or cream please. 3. You may take your regular medications, including pain medications, with a sip of water before your appointment.  Diabetics should hold regular insulin (if taken separately) and take 1/2 normal NPH dose the morning of the procedure.  Carry some sugar containing items with you to your appointment. 4. A driver must accompany you and be prepared to drive you home after your procedure. 5. Bring all your current medications with you. 6. An IV may be inserted and sedation may be given at the discretion of the physician. 7. A blood pressure cuff, EKG, and other monitors will often be applied during the procedure.  Some patients may need to have extra oxygen administered for a short period. 8. You will be asked to provide medical information, including allergies, prior to the procedure.  We must know immediately if you are taking blood  Thinners (like Coumadin) or if you are allergic to IV iodine contrast (dye).  Possible side-effects: All are usually temporary  Bleeding from needle site  Light headedness  Numbness and tingling  Decreased blood pressure  Weakness in arms/legs  Pressure sensation in back/neck  Pain at injection site (several days)  Possible complications: All are extremely rare  Infection  Nerve injury  Spinal headache (a headache  wore with upright position)  Call if you experience:  Fever/chills associated with headache or increased back/neck pain  Headache worsened by an upright position  New onset weakness or numbness of an extremity below the injection site  Hives or difficulty breathing (go to the emergency room)  Inflammation or drainage at the injection site(s)  Severe back/neck pain greater than usual  New symptoms which are concerning to you  Please note:  Although the local anesthetic injected can often make your back or neck feel good for several hours after the injection the pain will likely return.  It takes 3-5 days for steroids to work on the nerve root. You may not notice any pain relief for at least one week.  If effective, we will often do a series of 3 injections spaced 3-6 weeks apart to maximally decrease your pain.    If you have any questions, please call [phone] [facility]  General Discharge Instructions :  If you need to reach your doctor call: Monday-Friday 8:00 am - 4:00 pm at [phone] or toll free [phone].  After clinic hours [phone] to have operator reach doctor.  Bring all of your medication bottles to all your appointments in the pain clinic.  To cancel or reschedule your appointment with Pain Management please remember to call 24 hours in  advance to avoid a fee.  Refer to the educational materials which you have been given on: General Risks, I had my Procedure. Discharge Instructions, Post Sedation.  Post Procedure Instructions:  The drugs you were given will stay in your system until tomorrow, so for the next 24 hours you should not drive, make any legal decisions or drink any alcoholic beverages.  You may eat anything you prefer, but it is better to start with liquids then soups and crackers, and gradually work up to solid foods.  Please notify your doctor immediately if you  have any unusual bleeding, trouble breathing or pain that is not related to your normal pain.  Depending on the type of procedure that was done, some parts of your body may feel week and/or numb.  This usually clears up by tonight or the next day.  Walk with the use of an assistive device or accompanied by an adult for the 24 hours.  You may use ice on the affected area for the first 24 hours.  Put ice in a Ziploc bag and cover with a towel and place against area 15 minutes on 15 minutes off.  You may switch to heat after 24 hours.

## 2017-06-07 NOTE — Progress Notes (Signed)
Safety precautions to be maintained throughout the outpatient stay will include: orient to surroundings, keep bed in low position, maintain call bell within reach at all times, provide assistance with transfer out of bed and ambulation.  

## 2017-06-08 ENCOUNTER — Telehealth: Payer: Self-pay | Admitting: *Deleted

## 2017-06-08 NOTE — Telephone Encounter (Signed)
Spoke with patient, denies any questions or concerrns re; procedure on yesterday.

## 2017-06-27 ENCOUNTER — Encounter: Payer: Self-pay | Admitting: Pain Medicine

## 2017-06-27 ENCOUNTER — Ambulatory Visit: Payer: Medicare (Managed Care) | Attending: Pain Medicine | Admitting: Pain Medicine

## 2017-06-27 VITALS — BP 112/68 | HR 106 | Temp 98.2°F | Resp 20 | Ht 69.0 in | Wt 282.0 lb

## 2017-06-27 DIAGNOSIS — J449 Chronic obstructive pulmonary disease, unspecified: Secondary | ICD-10-CM | POA: Diagnosis not present

## 2017-06-27 DIAGNOSIS — Z794 Long term (current) use of insulin: Secondary | ICD-10-CM | POA: Diagnosis not present

## 2017-06-27 DIAGNOSIS — I10 Essential (primary) hypertension: Secondary | ICD-10-CM | POA: Diagnosis not present

## 2017-06-27 DIAGNOSIS — G5603 Carpal tunnel syndrome, bilateral upper limbs: Secondary | ICD-10-CM | POA: Insufficient documentation

## 2017-06-27 DIAGNOSIS — M7918 Myalgia, other site: Secondary | ICD-10-CM | POA: Diagnosis not present

## 2017-06-27 DIAGNOSIS — M4722 Other spondylosis with radiculopathy, cervical region: Secondary | ICD-10-CM | POA: Diagnosis not present

## 2017-06-27 DIAGNOSIS — R94131 Abnormal electromyogram [EMG]: Secondary | ICD-10-CM | POA: Insufficient documentation

## 2017-06-27 DIAGNOSIS — M5126 Other intervertebral disc displacement, lumbar region: Secondary | ICD-10-CM | POA: Diagnosis not present

## 2017-06-27 DIAGNOSIS — Z79899 Other long term (current) drug therapy: Secondary | ICD-10-CM | POA: Diagnosis not present

## 2017-06-27 DIAGNOSIS — M1712 Unilateral primary osteoarthritis, left knee: Secondary | ICD-10-CM | POA: Diagnosis not present

## 2017-06-27 DIAGNOSIS — Z79891 Long term (current) use of opiate analgesic: Secondary | ICD-10-CM | POA: Diagnosis not present

## 2017-06-27 DIAGNOSIS — Z7982 Long term (current) use of aspirin: Secondary | ICD-10-CM | POA: Diagnosis not present

## 2017-06-27 DIAGNOSIS — M48061 Spinal stenosis, lumbar region without neurogenic claudication: Secondary | ICD-10-CM | POA: Insufficient documentation

## 2017-06-27 DIAGNOSIS — M79605 Pain in left leg: Secondary | ICD-10-CM

## 2017-06-27 DIAGNOSIS — M5417 Radiculopathy, lumbosacral region: Secondary | ICD-10-CM | POA: Diagnosis not present

## 2017-06-27 DIAGNOSIS — M545 Low back pain: Secondary | ICD-10-CM | POA: Insufficient documentation

## 2017-06-27 DIAGNOSIS — M25512 Pain in left shoulder: Secondary | ICD-10-CM | POA: Diagnosis not present

## 2017-06-27 DIAGNOSIS — M79604 Pain in right leg: Secondary | ICD-10-CM | POA: Diagnosis not present

## 2017-06-27 DIAGNOSIS — G8929 Other chronic pain: Secondary | ICD-10-CM | POA: Diagnosis not present

## 2017-06-27 DIAGNOSIS — M25511 Pain in right shoulder: Secondary | ICD-10-CM | POA: Insufficient documentation

## 2017-06-27 DIAGNOSIS — G894 Chronic pain syndrome: Secondary | ICD-10-CM | POA: Diagnosis not present

## 2017-06-27 DIAGNOSIS — M5136 Other intervertebral disc degeneration, lumbar region: Secondary | ICD-10-CM

## 2017-06-27 DIAGNOSIS — M4726 Other spondylosis with radiculopathy, lumbar region: Secondary | ICD-10-CM | POA: Insufficient documentation

## 2017-06-27 DIAGNOSIS — F1120 Opioid dependence, uncomplicated: Secondary | ICD-10-CM | POA: Insufficient documentation

## 2017-06-27 DIAGNOSIS — R0789 Other chest pain: Secondary | ICD-10-CM | POA: Diagnosis not present

## 2017-06-27 DIAGNOSIS — E1142 Type 2 diabetes mellitus with diabetic polyneuropathy: Secondary | ICD-10-CM | POA: Insufficient documentation

## 2017-06-27 DIAGNOSIS — M5416 Radiculopathy, lumbar region: Secondary | ICD-10-CM | POA: Diagnosis not present

## 2017-06-27 DIAGNOSIS — M47812 Spondylosis without myelopathy or radiculopathy, cervical region: Secondary | ICD-10-CM | POA: Diagnosis not present

## 2017-06-27 DIAGNOSIS — E785 Hyperlipidemia, unspecified: Secondary | ICD-10-CM | POA: Diagnosis not present

## 2017-06-27 DIAGNOSIS — R531 Weakness: Secondary | ICD-10-CM | POA: Diagnosis not present

## 2017-06-27 NOTE — Progress Notes (Signed)
Patient's Name: James Friedman  MRN: [id number]  Referring Provider: Gareth Morgan, MD  DOB: [birth year]  PCP: Gareth Morgan, MD  DOS: 06/27/2017  Note by: Gaspar Cola, MD  Service setting: Ambulatory outpatient  Specialty: Interventional Pain Management  Location: [facility] (AMB) Pain Management Facility    Patient type: Established   Primary Reason(s) for Visit: Encounter for post-procedure evaluation of chronic illness with mild to moderate exacerbation CC: Back Pain (back)  HPI  James Friedman is a 71 y.o. year old, male patient, who comes today for a post-procedure evaluation. He has Hyperlipidemia; Chronic shoulder pain (Bilateral) (L>R); Non-traumatic rotator cuff tear; Chronic pain syndrome; Chronic low back pain (Primary Source of Pain) (Bilateral) (L>R); Lumbosacral radiculopathy at S1 (Right side); Lumbar facet syndrome (Bilateral) (L>R); Chronic neck pain (Bilateral) (R>L); Chronic cervical radicular pain (Bilateral) (R>L); Cervical spondylosis; Morbid obesity ([place]); Long term current use of opiate analgesic; Long term prescription opiate use; Opiate use (60 MME/Day); Opiate dependence ([place]); Chronic sacroiliac joint pain (Bilateral) (R>L); Failed neck surgery syndrome; Chronic upper extremity pain (Bilateral) (R>L); History of bilateral knee surgery; Diffuse myofascial pain syndrome; Lumbar facet hypertrophy (L4-5 and L5-S1); Carpal tunnel syndrome on right (Released); Transient weakness of left lower extremity; At high risk for falls; 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); Grade 1 Anterolisthesis of L4 over L5; Lumbar disc herniation (Left L4-5) (Right L5-S1); Chronic lower extremity pain (Secondary source of pain) (Bilateral) (R>L); Essential hypertension; Diabetes mellitus without complication ([place]); Atypical chest pain; Chronic knee pain (Bilateral); Diabetic peripheral neuropathy ([place]); Chronic lumbar radicular pain (Bilateral) (R>L) (S1 Dermatome on the left);  Abnormal EMG/PNCV (11/26/2015); Postoperative pain (post-radiofrequency); Lumbar spondylosis; COPD 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 ([place]); Polyneuropathy; Osteoarthritis of knee (Bilateral); Osteoarthritis of shoulder (Bilateral); Chronic shoulder Arthropathy (Bilateral); Cervical myofascial pain syndrome (Left trapezius); Primary osteoarthritis of left knee; Primary osteoarthritis of right knee; Suprapatellar bursitis of right knee; Chronic lumbar radiculopathy; Lumbar spinal stenosis (L3-4); and Polypharmacy on his problem list. His primarily concern today is the Back Pain (back)  Pain Assessment: Location: Lower Back Radiating: denies Onset: More than a month ago Duration: Chronic pain Quality:  (aggravating) Severity: 0-No pain/10 (self-reported pain score)  Note: Reported level is compatible with observation.                   When using our objective Pain Scale, levels between 6 and 10/10 are said to belong in an emergency room, as it progressively worsens from a 6/10, described as severely limiting, requiring emergency care not usually available at an outpatient pain management facility. At a 6/10 level, communication becomes difficult and requires great effort. Assistance to reach the emergency department may be required. Facial flushing and profuse sweating along with potentially dangerous increases in heart rate and blood pressure will be evident. Timing: Constant  James Friedman comes in today for post-procedure evaluation after the treatment done on 06/07/2017.  Further details on both, my assessment(s), as well as the proposed treatment plan, please see below.  Post-Procedure Assessment  06/07/2017 Procedure: Therapeutic right-sided L2-3 transforaminal epidural steroid injection under fluoroscopic guidance, without IV sedation Pre-procedure pain score:  7/10 Post-procedure pain score: 0/10 (100% relief) Influential Factors: BMI: 41.64 kg/m Intra-procedural challenges: None observed.          Assessment challenges: None detected.              Reported side-effects: None.        Post-procedural adverse reactions or complications: None reported  Sedation: Sedation provided. When no sedatives are used, the analgesic levels obtained are directly associated to the effectiveness of the local anesthetics. However, when sedation is provided, the level of analgesia obtained during the initial 1 hour following the intervention, is believed to be the result of a combination of factors. These factors may include, but are not limited to: 1. The effectiveness of the local anesthetics used. 2. The effects of the analgesic(s) and/or anxiolytic(s) used. 3. The degree of discomfort experienced by the patient at the time of the procedure. 4. The patients ability and reliability in recalling and recording the events. 5. The presence and influence of possible secondary gains and/or psychosocial factors. Reported result: Relief experienced during the 1st hour after the procedure: 100 % (Ultra-Short Term Relief)            Interpretative annotation: Clinically appropriate result. Analgesia during this period is likely to be Local Anesthetic and/or IV Sedative (Analgesic/Anxiolytic) related.          Effects of local anesthetic: The analgesic effects attained during this period are directly associated to the localized infiltration of local anesthetics and therefore cary significant diagnostic value as to the etiological location, or anatomical origin, of the pain. Expected duration of relief is directly dependent on the pharmacodynamics of the local anesthetic used. Long-acting (4-6 hours) anesthetics used.  Reported result: Relief during the next 4 to 6 hour after the procedure: 100 % (Short-Term Relief)            Interpretative annotation: Clinically appropriate result. Analgesia during this period is likely to be Local Anesthetic-related.          Long-term benefit: Defined as the period of time  past the expected duration of local anesthetics (1 hour for short-acting and 4-6 hours for long-acting). With the possible exception of prolonged sympathetic blockade from the local anesthetics, benefits during this period are typically attributed to, or associated with, other factors such as analgesic sensory neuropraxia, antiinflammatory effects, or beneficial biochemical changes provided by agents other than the local anesthetics.  Reported result: Extended relief following procedure: 100 % (Long-Term Relief)            Interpretative annotation: Clinically appropriate result. Good relief. No permanent benefit expected. Inflammation plays a part in the etiology to the pain.          Current benefits: Defined as reported results that persistent at this point in time.   Analgesia: 100 % Mr. Privitera reports improvement of extremity symptoms. Function: Mr. Blackwelder reports improvement in function ROM: Mr. Bhat reports improvement in ROM Interpretative annotation: Recurrence of symptoms. No permanent benefit expected. Effective diagnostic intervention.          Interpretation: Results would suggest a successful diagnostic intervention.                  Plan:  Please see "Plan of Care" for details.       Laboratory Chemistry  Inflammation Markers (CRP: Acute Phase) (ESR: Chronic Phase) Lab Results  Component Value Date   CRP 1.3 03/28/2017   ESRSEDRATE 3 03/28/2017                 Renal Function Markers Lab Results  Component Value Date   BUN 19 03/28/2017   CREATININE 0.79 03/28/2017   GFRAA 107 03/28/2017   GFRNONAA 92 03/28/2017                 Hepatic Function Markers Lab Results  Component Value  Date   AST 19 03/28/2017   ALT 23 03/28/2017   ALBUMIN 4.3 03/28/2017   ALKPHOS 70 03/28/2017                 Electrolytes Lab Results  Component Value Date   NA 136 03/28/2017   K 5.1 03/28/2017   CL 91 (L) 03/28/2017   CALCIUM 9.1 03/28/2017   MG 1.8 03/28/2017                  Neuropathy Markers Lab Results  Component Value Date   VITAMINB12 255 03/28/2017                 Bone Pathology Markers Lab Results  Component Value Date   ALKPHOS 70 03/28/2017   25OHVITD1 35 03/28/2017   25OHVITD2 14 03/28/2017   25OHVITD3 21 03/28/2017   CALCIUM 9.1 03/28/2017                 Coagulation Parameters No results found for: INR, LABPROT, APTT, PLT               Cardiovascular Markers No results found for: BNP, HGB, HCT               Note: Lab results reviewed.  Recent Diagnostic Imaging Results  DG C-Arm 1-60 Min-No Report Fluoroscopy was utilized by the requesting physician.  No radiographic  interpretation.   Complexity Note: Imaging results reviewed. Results shared with Mr. Chisolm, using Layman's terms.                         Meds   Current Outpatient Prescriptions:  .  acetaminophen (TYLENOL) 325 MG tablet, Take 650 mg by mouth as needed. , Disp: , Rfl:  .  albuterol (PROAIR HFA) 108 (90 BASE) MCG/ACT inhaler, Inhale 2 puffs into the lungs every 6 (six) hours as needed for wheezing., Disp: , Rfl:  .  aluminum hydroxide (AMPHOJEL/ALTERNAGEL) 320 MG/5ML suspension, Take 30 mLs by mouth every 6 (six) hours as needed for indigestion or heartburn., Disp: , Rfl:  .  amLODipine (NORVASC) 5 MG tablet, Take 5 mg by mouth daily. , Disp: , Rfl:  .  antiseptic oral rinse (BIOTENE) LIQD, 15 mLs by Mouth Rinse route as needed for dry mouth., Disp: , Rfl:  .  aspirin 81 MG chewable tablet, Chew by mouth., Disp: , Rfl:  .  atorvastatin (LIPITOR) 80 MG tablet, Take 80 mg by mouth daily., Disp: , Rfl:  .  azelastine (ASTELIN) 0.1 % nasal spray, Place 1 spray into both nostrils as needed. Use in each nostril as directed , Disp: , Rfl:  .  capsaicin (ZOSTRIX) 0.025 % cream, Apply 1 application topically 2 (two) times daily., Disp: , Rfl:  .  Carboxymethylcellul-Glycerin (OPTIVE) 0.5-0.9 % SOLN, Apply 1 drop to eye daily., Disp: , Rfl:  .  cetirizine (ZYRTEC) 10  MG tablet, Take 10 mg by mouth daily., Disp: , Rfl:  .  chlorpheniramine (CHLOR-TRIMETON) 4 MG tablet, Take 4 mg by mouth every 6 (six) hours as needed for allergies., Disp: , Rfl:  .  Clotrimazole 1 % OINT, Apply 1 application topically 2 (two) times daily. , Disp: , Rfl:  .  diclofenac sodium (VOLTAREN) 1 % GEL, Apply topically 2 (two) times daily as needed., Disp: , Rfl:  .  docusate sodium (COLACE) 100 MG capsule, Take 100 mg by mouth daily as needed for mild constipation., Disp: , Rfl:  .  DULoxetine (CYMBALTA) 30 MG capsule, Take 30 mg by mouth 2 (two) times daily., Disp: , Rfl:  .  ergocalciferol (VITAMIN D2) 50000 units capsule, Take 50,000 Units by mouth every 30 (thirty) days., Disp: , Rfl:  .  fluticasone (FLONASE) 50 MCG/ACT nasal spray, , Disp: , Rfl:  .  gabapentin (NEURONTIN) 600 MG tablet, Take 600 mg by mouth 2 (two) times daily. One capsule in the am and two capsules at night, Disp: , Rfl:  .  glucagon (GLUCAGON EMERGENCY) 1 MG injection, Inject 1 mg into the vein once as needed., Disp: , Rfl:  .  hydrOXYzine (ATARAX/VISTARIL) 25 MG tablet, Take 25 mg by mouth every 6 (six) hours as needed for itching., Disp: , Rfl:  .  insulin aspart (NOVOLOG) 100 UNIT/ML injection, Inject 70 Units into the skin 4 (four) times daily -  before meals and at bedtime. , Disp: , Rfl:  .  insulin detemir (LEVEMIR) 100 UNIT/ML injection, Inject 80 Units into the skin 2 (two) times daily. , Disp: , Rfl:  .  lidocaine (LIDODERM) 5 %, Place 1 patch onto the skin daily. Remove & Discard patch within 12 hours or as directed by MD, Disp: , Rfl:  .  lisinopril (PRINIVIL,ZESTRIL) 10 MG tablet, Take 10 mg by mouth daily., Disp: , Rfl:  .  Magnesium 400 MG TABS, Take 1 tablet by mouth 2 (two) times daily. , Disp: , Rfl:  .  metFORMIN (GLUCOPHAGE) 1000 MG tablet, Take 1,000 mg by mouth 2 (two) times daily with a meal., Disp: , Rfl:  .  methocarbamol (ROBAXIN) 500 MG tablet, Take 500 mg by mouth 2 (two) times daily.  , Disp: , Rfl:  .  metoprolol (LOPRESSOR) 100 MG tablet, Take 100 mg by mouth 2 (two) times daily., Disp: , Rfl:  .  mometasone (NASONEX) 50 MCG/ACT nasal spray, Place 2 sprays into the nose daily., Disp: , Rfl:  .  morphine (MS CONTIN) 15 MG 12 hr tablet, Take 1 tablet (15 mg total) by mouth every 12 (twelve) hours., Disp: 60 tablet, Rfl: 0 .  [START ON 07/07/2017] morphine (MS CONTIN) 15 MG 12 hr tablet, Take 1 tablet (15 mg total) by mouth every 12 (twelve) hours., Disp: 60 tablet, Rfl: 0 .  [START ON 08/06/2017] morphine (MS CONTIN) 15 MG 12 hr tablet, Take 1 tablet (15 mg total) by mouth every 12 (twelve) hours., Disp: 60 tablet, Rfl: 0 .  Multiple Vitamin (MULTIVITAMIN) tablet, Take 1 tablet by mouth daily., Disp: , Rfl:  .  naloxone (NARCAN) nasal spray 4 mg/0.1 mL, Place 1 spray into the nose., Disp: , Rfl:  .  naproxen (NAPROSYN) 500 MG tablet, Take 500 mg by mouth 2 (two) times daily with a meal., Disp: , Rfl:  .  Omega-3 Fatty Acids (FISH OIL) 1000 MG CAPS, Take by mouth 2 (two) times daily., Disp: , Rfl:  .  omeprazole (PRILOSEC) 20 MG capsule, Take 20 mg by mouth daily., Disp: , Rfl:  .  oxyCODONE (OXY IR/ROXICODONE) 5 MG immediate release tablet, Take 1 tablet (5 mg total) by mouth every 4 (four) hours as needed for severe pain., Disp: 120 tablet, Rfl: 0 .  [START ON 07/07/2017] oxyCODONE (OXY IR/ROXICODONE) 5 MG immediate release tablet, Take 1 tablet (5 mg total) by mouth every 6 (six) hours as needed for severe pain., Disp: 120 tablet, Rfl: 0 .  [START ON 08/06/2017] oxyCODONE (OXY IR/ROXICODONE) 5 MG immediate release tablet, Take 1 tablet (5 mg total) by  mouth every 4 (four) hours as needed for severe pain., Disp: 120 tablet, Rfl: 0 .  OXYGEN, Inhale 3 L into the lungs at bedtime., Disp: , Rfl:  .  pantoprazole (PROTONIX) 40 MG tablet, Take 40 mg by mouth., Disp: , Rfl:  .  polyethylene glycol powder (GLYCOLAX/MIRALAX) powder, Take by mouth as needed. , Disp: , Rfl:  .   promethazine (PHENERGAN) 12.5 MG tablet, Take by mouth., Disp: , Rfl:  .  senna (SENOKOT) 8.6 MG TABS tablet, Take 1 tablet by mouth daily as needed for mild constipation., Disp: , Rfl:  .  sitaGLIPtin-metformin (JANUMET) 50-1000 MG tablet, Take 1 tablet by mouth 2 (two) times daily with a meal., Disp: , Rfl:  .  testosterone cypionate (DEPOTESTOTERONE CYPIONATE) 200 MG/ML injection, Inject 200 mg into the muscle every 14 (fourteen) days., Disp: , Rfl:  .  traZODone (DESYREL) 150 MG tablet, 150 mg daily. , Disp: , Rfl:  .  vitamin C (ASCORBIC ACID) 500 MG tablet, Take 500 mg by mouth daily., Disp: , Rfl:  .  Vitamins A & D (VITAMIN A & D) ointment, Apply 1 application topically as needed for dry skin., Disp: , Rfl:   ROS  Constitutional: Denies any fever or chills Gastrointestinal: No reported hemesis, hematochezia, vomiting, or acute GI distress Musculoskeletal: Denies any acute onset joint swelling, redness, loss of ROM, or weakness Neurological: No reported episodes of acute onset apraxia, aphasia, dysarthria, agnosia, amnesia, paralysis, loss of coordination, or loss of consciousness  Allergies  Mr. Shadd has No Known Allergies.  PFSH  Drug: Mr. Imes  reports that he does not use drugs. Alcohol:  reports that he does not drink alcohol. Tobacco:  reports that he has quit smoking. His smoking use included Cigarettes. He has a 3.75 pack-year smoking history. His smokeless tobacco use includes Chew. Medical:  has a past medical history of Anxiety; Asthma; Atypical chest pain; Back pain; BPH (benign prostatic hypertrophy); Carpal tunnel syndrome; Carpal tunnel syndrome on right (07/17/2015); Chest pain (07/26/2013); DDD (degenerative disc disease); Diabetes mellitus without complication ([place]); Essential hypertension; Falls; GERD (gastroesophageal reflux disease); Heme positive stool; History of bilateral knee surgery (07/17/2015); Hyperlipidemia; Hypertension; Morbid obesity ([place]); OA  (osteoarthritis); OSA (obstructive sleep apnea); Ventral hernia; and Wears dentures. Surgical: Mr. Ewan  has a past surgical history that includes Knee surgery (Bilateral); Neck surgery; Carpal tunnel release; Shoulder surgery (Right); Cataract extraction, extracapsular (Left, 08/04/2015); and Cataract extraction w/PHACO (Right, 04/19/2016). Family: family history includes Heart attack in his father.  Constitutional Exam  General appearance: Well nourished, well developed, and well hydrated. In no apparent acute distress Vitals:   06/27/17 1119  BP: 112/68  Pulse: (!) 106  Resp: 20  Temp: 98.2 F (36.8 C)  SpO2: 95%  Weight: 282 lb (127.9 kg)  Height: _0  (1.753 m)   BMI Assessment: Estimated body mass index is 41.64 kg/m as calculated from the following:   Height as of this encounter: _1  (1.753 m).   Weight as of this encounter: 282 lb (127.9 kg).  BMI interpretation table: BMI level Category Range association with higher incidence of chronic pain  <18 kg/m2 Underweight   18.5-24.9 kg/m2 Ideal body weight   25-29.9 kg/m2 Overweight Increased incidence by 20%  30-34.9 kg/m2 Obese (Class I) Increased incidence by 68%  35-39.9 kg/m2 Severe obesity (Class II) Increased incidence by 136%  >40 kg/m2 Extreme obesity (Class III) Increased incidence by 254%   BMI Readings from Last 4 Encounters:  06/27/17 41.64  kg/m  06/07/17 41.64 kg/m  05/31/17 41.64 kg/m  05/12/17 43.12 kg/m   Wt Readings from Last 4 Encounters:  06/27/17 282 lb (127.9 kg)  06/07/17 282 lb (127.9 kg)  05/31/17 282 lb (127.9 kg)  05/12/17 292 lb (132.5 kg)  Psych/Mental status: Alert, oriented x 3 (person, place, & time)       Eyes: PERLA Respiratory: No evidence of acute respiratory distress  Cervical Spine Area Exam  Skin & Axial Inspection: No masses, redness, edema, swelling, or associated skin lesions Alignment: Symmetrical Functional ROM: Unrestricted ROM      Stability: No instability  detected Muscle Tone/Strength: Functionally intact. No obvious neuro-muscular anomalies detected. Sensory (Neurological): Unimpaired Palpation: No palpable anomalies              Upper Extremity (UE) Exam    Side: Right upper extremity  Side: Left upper extremity  Skin & Extremity Inspection: Skin color, temperature, and hair growth are WNL. No peripheral edema or cyanosis. No masses, redness, swelling, asymmetry, or associated skin lesions. No contractures.  Skin & Extremity Inspection: Skin color, temperature, and hair growth are WNL. No peripheral edema or cyanosis. No masses, redness, swelling, asymmetry, or associated skin lesions. No contractures.  Functional ROM: Unrestricted ROM          Functional ROM: Unrestricted ROM          Muscle Tone/Strength: Functionally intact. No obvious neuro-muscular anomalies detected.  Muscle Tone/Strength: Functionally intact. No obvious neuro-muscular anomalies detected.  Sensory (Neurological): Unimpaired          Sensory (Neurological): Unimpaired          Palpation: No palpable anomalies              Palpation: No palpable anomalies              Specialized Test(s): Deferred         Specialized Test(s): Deferred          Thoracic Spine Area Exam  Skin & Axial Inspection: No masses, redness, or swelling Alignment: Symmetrical Functional ROM: Unrestricted ROM Stability: No instability detected Muscle Tone/Strength: Functionally intact. No obvious neuro-muscular anomalies detected. Sensory (Neurological): Unimpaired Muscle strength & Tone: No palpable anomalies  Lumbar Spine Area Exam  Skin & Axial Inspection: No masses, redness, or swelling Alignment: Symmetrical Functional ROM: Unrestricted ROM      Stability: No instability detected Muscle Tone/Strength: Functionally intact. No obvious neuro-muscular anomalies detected. Sensory (Neurological): Unimpaired Palpation: No palpable anomalies       Provocative Tests: Lumbar Hyperextension and  rotation test: evaluation deferred today       Lumbar Lateral bending test: evaluation deferred today       Patrick's Maneuver: evaluation deferred today                    Gait & Posture Assessment  Ambulation: Patient came in today in a wheel chair Gait: Very limited, using assistive device to ambulate Posture: Antalgic   Lower Extremity Exam    Side: Right lower extremity  Side: Left lower extremity  Skin & Extremity Inspection: Skin color, temperature, and hair growth are WNL. No peripheral edema or cyanosis. No masses, redness, swelling, asymmetry, or associated skin lesions. No contractures.  Skin & Extremity Inspection: Skin color, temperature, and hair growth are WNL. No peripheral edema or cyanosis. No masses, redness, swelling, asymmetry, or associated skin lesions. No contractures.  Functional ROM: Unrestricted ROM          Functional  ROM: Unrestricted ROM          Muscle Tone/Strength: Functionally intact. No obvious neuro-muscular anomalies detected.  Muscle Tone/Strength: Functionally intact. No obvious neuro-muscular anomalies detected.  Sensory (Neurological): Unimpaired  Sensory (Neurological): Unimpaired  Palpation: No palpable anomalies  Palpation: No palpable anomalies   Assessment  Primary Diagnosis & Pertinent Problem List: The primary encounter diagnosis was Cervical myofascial pain syndrome (Left trapezius). Diagnoses of Chronic low back pain (Primary Source of Pain) (Bilateral) (L>R), Chronic lower extremity pain (Secondary source of pain) (Bilateral) (R>L), Cervical spondylosis, Other intervertebral disc degeneration, lumbar region, Chronic lumbar radicular pain (Bilateral) (R>L) (S1 Dermatome on the left), Chronic lumbar radiculopathy, and Polypharmacy were also pertinent to this visit.  Status Diagnosis  Controlled Improved Resolved 1. Cervical myofascial pain syndrome (Left trapezius)   2. Chronic low back pain (Primary Source of Pain) (Bilateral) (L>R)   3.  Chronic lower extremity pain (Secondary source of pain) (Bilateral) (R>L)   4. Cervical spondylosis   5. Other intervertebral disc degeneration, lumbar region   6. Chronic lumbar radicular pain (Bilateral) (R>L) (S1 Dermatome on the left)   7. Chronic lumbar radiculopathy   8. Polypharmacy     Problems updated and reviewed during this visit: Problem  Chronic lumbar radicular pain (Bilateral) (R>L) (S1 Dermatome on the left)  Chronic lower extremity pain (Secondary source of pain) (Bilateral) (R>L)   Possible left L4 and right S1 radiculopathies.   Chronic low back pain (Primary Source of Pain) (Bilateral) (L>R)  Polypharmacy   Plan of Care  Pharmacotherapy (Medications Ordered): No orders of the defined types were placed in this encounter.  New Prescriptions   No medications on file   Medications administered today: Mr. Sommers had no medications administered during this visit.  Procedure Orders    No procedure(s) ordered today   Lab Orders  No laboratory test(s) ordered today    Imaging Orders     MR LUMBAR SPINE WO CONTRAST Referral Orders  No referral(s) requested today    Interventional management options: Planned, scheduled, and/or pending:   None at this time. L-MRI   Considering:   Diagnostic bilateral suprascapular nerve block Diagnosticbilateral series of 5 intra-articular Hyalgan knee injection Palliative bilateral lumbar facet radiofrequencyablation. (Right done 07/08/2016; 10/02/15 before that. Left done 12/09/2015)  Right intra-articular shoulder injection Possible intra-articular right knee injection Palliative right cervical epidural steroidinjection  Palliative bilateral genicular nerve RFA.    Palliative PRN treatment(s):   Palliative bilateral lumbar facet block Palliative bilateral intra-articular Hyalgan knee injection   Provider-requested follow-up: Return for Med-Mgmt w/ Dionisio David, NP.  Future Appointments Date Time  Provider [place]  08/30/2017 10:30 AM Vevelyn Francois, NP [facility] None   Primary Care Physician: Gareth Morgan, MD Location: [facility] Note by: Gaspar Cola, MD Date: 06/27/2017; Time: 2:04 PM

## 2017-06-27 NOTE — Progress Notes (Signed)
Safety precautions to be maintained throughout the outpatient stay will include: orient to surroundings, keep bed in low position, maintain call bell within reach at all times, provide assistance with transfer out of bed and ambulation.  

## 2017-06-27 NOTE — Patient Instructions (Signed)

## 2017-07-21 ENCOUNTER — Ambulatory Visit
Admission: RE | Admit: 2017-07-21 | Discharge: 2017-07-21 | Disposition: A | Discharge status: Home / Self Care | Payer: Medicare (Managed Care) | Source: Ambulatory Visit | Attending: Pain Medicine | Admitting: Pain Medicine

## 2017-07-21 DIAGNOSIS — M79605 Pain in left leg: Secondary | ICD-10-CM

## 2017-07-21 DIAGNOSIS — G8929 Other chronic pain: Secondary | ICD-10-CM

## 2017-07-21 DIAGNOSIS — M545 Low back pain, unspecified: Secondary | ICD-10-CM

## 2017-07-21 DIAGNOSIS — M51369 Other intervertebral disc degeneration, lumbar region without mention of lumbar back pain or lower extremity pain: Secondary | ICD-10-CM

## 2017-07-21 DIAGNOSIS — M5136 Other intervertebral disc degeneration, lumbar region: Secondary | ICD-10-CM

## 2017-07-21 DIAGNOSIS — M5416 Radiculopathy, lumbar region: Secondary | ICD-10-CM

## 2017-07-21 DIAGNOSIS — M79604 Pain in right leg: Secondary | ICD-10-CM

## 2017-08-30 ENCOUNTER — Encounter: Payer: Medicare (Managed Care) | Admitting: Nurse Practitioner

## 2017-09-02 ENCOUNTER — Other Ambulatory Visit: Payer: Self-pay

## 2017-09-02 ENCOUNTER — Ambulatory Visit: Payer: Medicare (Managed Care) | Attending: Nurse Practitioner | Admitting: Nurse Practitioner

## 2017-09-02 ENCOUNTER — Encounter: Payer: Self-pay | Admitting: Nurse Practitioner

## 2017-09-02 VITALS — BP 131/79 | HR 96 | Temp 98.9°F | Resp 18 | Ht 69.0 in | Wt 292.0 lb

## 2017-09-02 DIAGNOSIS — Z7982 Long term (current) use of aspirin: Secondary | ICD-10-CM | POA: Insufficient documentation

## 2017-09-02 DIAGNOSIS — G4733 Obstructive sleep apnea (adult) (pediatric): Secondary | ICD-10-CM | POA: Insufficient documentation

## 2017-09-02 DIAGNOSIS — I10 Essential (primary) hypertension: Secondary | ICD-10-CM | POA: Insufficient documentation

## 2017-09-02 DIAGNOSIS — M4726 Other spondylosis with radiculopathy, lumbar region: Secondary | ICD-10-CM | POA: Diagnosis not present

## 2017-09-02 DIAGNOSIS — M79605 Pain in left leg: Secondary | ICD-10-CM

## 2017-09-02 DIAGNOSIS — Z87891 Personal history of nicotine dependence: Secondary | ICD-10-CM | POA: Diagnosis not present

## 2017-09-02 DIAGNOSIS — J449 Chronic obstructive pulmonary disease, unspecified: Secondary | ICD-10-CM | POA: Insufficient documentation

## 2017-09-02 DIAGNOSIS — M542 Cervicalgia: Secondary | ICD-10-CM

## 2017-09-02 DIAGNOSIS — Z9841 Cataract extraction status, right eye: Secondary | ICD-10-CM | POA: Diagnosis not present

## 2017-09-02 DIAGNOSIS — M48061 Spinal stenosis, lumbar region without neurogenic claudication: Secondary | ICD-10-CM | POA: Insufficient documentation

## 2017-09-02 DIAGNOSIS — Z9842 Cataract extraction status, left eye: Secondary | ICD-10-CM | POA: Insufficient documentation

## 2017-09-02 DIAGNOSIS — J45909 Unspecified asthma, uncomplicated: Secondary | ICD-10-CM | POA: Diagnosis not present

## 2017-09-02 DIAGNOSIS — E1142 Type 2 diabetes mellitus with diabetic polyneuropathy: Secondary | ICD-10-CM | POA: Insufficient documentation

## 2017-09-02 DIAGNOSIS — M5116 Intervertebral disc disorders with radiculopathy, lumbar region: Secondary | ICD-10-CM | POA: Insufficient documentation

## 2017-09-02 DIAGNOSIS — N400 Enlarged prostate without lower urinary tract symptoms: Secondary | ICD-10-CM | POA: Insufficient documentation

## 2017-09-02 DIAGNOSIS — M5416 Radiculopathy, lumbar region: Secondary | ICD-10-CM

## 2017-09-02 DIAGNOSIS — M79604 Pain in right leg: Secondary | ICD-10-CM | POA: Diagnosis not present

## 2017-09-02 DIAGNOSIS — Z79899 Other long term (current) drug therapy: Secondary | ICD-10-CM | POA: Insufficient documentation

## 2017-09-02 DIAGNOSIS — M19012 Primary osteoarthritis, left shoulder: Secondary | ICD-10-CM | POA: Insufficient documentation

## 2017-09-02 DIAGNOSIS — M7918 Myalgia, other site: Secondary | ICD-10-CM | POA: Insufficient documentation

## 2017-09-02 DIAGNOSIS — M533 Sacrococcygeal disorders, not elsewhere classified: Secondary | ICD-10-CM | POA: Diagnosis not present

## 2017-09-02 DIAGNOSIS — Z7951 Long term (current) use of inhaled steroids: Secondary | ICD-10-CM | POA: Diagnosis not present

## 2017-09-02 DIAGNOSIS — K219 Gastro-esophageal reflux disease without esophagitis: Secondary | ICD-10-CM | POA: Insufficient documentation

## 2017-09-02 DIAGNOSIS — Z79891 Long term (current) use of opiate analgesic: Secondary | ICD-10-CM | POA: Insufficient documentation

## 2017-09-02 DIAGNOSIS — M4722 Other spondylosis with radiculopathy, cervical region: Secondary | ICD-10-CM | POA: Diagnosis not present

## 2017-09-02 DIAGNOSIS — Z9181 History of falling: Secondary | ICD-10-CM | POA: Insufficient documentation

## 2017-09-02 DIAGNOSIS — F419 Anxiety disorder, unspecified: Secondary | ICD-10-CM | POA: Insufficient documentation

## 2017-09-02 DIAGNOSIS — G8929 Other chronic pain: Secondary | ICD-10-CM | POA: Diagnosis not present

## 2017-09-02 DIAGNOSIS — G894 Chronic pain syndrome: Secondary | ICD-10-CM | POA: Diagnosis not present

## 2017-09-02 DIAGNOSIS — Z8249 Family history of ischemic heart disease and other diseases of the circulatory system: Secondary | ICD-10-CM | POA: Diagnosis not present

## 2017-09-02 DIAGNOSIS — M545 Low back pain: Secondary | ICD-10-CM

## 2017-09-02 DIAGNOSIS — E785 Hyperlipidemia, unspecified: Secondary | ICD-10-CM | POA: Insufficient documentation

## 2017-09-02 DIAGNOSIS — M4316 Spondylolisthesis, lumbar region: Secondary | ICD-10-CM | POA: Insufficient documentation

## 2017-09-02 DIAGNOSIS — M19011 Primary osteoarthritis, right shoulder: Secondary | ICD-10-CM | POA: Insufficient documentation

## 2017-09-02 DIAGNOSIS — M5117 Intervertebral disc disorders with radiculopathy, lumbosacral region: Secondary | ICD-10-CM | POA: Insufficient documentation

## 2017-09-02 DIAGNOSIS — Z794 Long term (current) use of insulin: Secondary | ICD-10-CM | POA: Insufficient documentation

## 2017-09-02 DIAGNOSIS — M170 Bilateral primary osteoarthritis of knee: Secondary | ICD-10-CM | POA: Insufficient documentation

## 2017-09-02 MED ORDER — MORPHINE SULFATE ER 15 MG PO TBCR: 15.0000 mg | EXTENDED_RELEASE_TABLET | Freq: Two times a day (BID) | ORAL | 0 refills | Status: DC

## 2017-09-02 MED ORDER — OXYCODONE HCL 5 MG PO TABS: 5.0000 mg | ORAL_TABLET | Freq: Four times a day (QID) | ORAL | 0 refills | Status: DC | PRN

## 2017-09-02 MED ORDER — OXYCODONE HCL 5 MG PO TABS: 5.0000 mg | ORAL_TABLET | ORAL | 0 refills | Status: DC | PRN

## 2017-09-02 NOTE — Patient Instructions (Addendum)
____________________________________________________________________________________________  Medication Rules  Applies to: All patients receiving prescriptions (written or electronic).  Pharmacy of record: Pharmacy where electronic prescriptions will be sent. If written prescriptions are taken to a different pharmacy, please inform the nursing staff. The pharmacy listed in the electronic medical record should be the one where you would like electronic prescriptions to be sent.  Prescription refills: Only during scheduled appointments. Applies to both, written and electronic prescriptions.  NOTE: The following applies primarily to controlled substances (Opioid* Pain Medications).   Patient's responsibilities: 1. Pain Pills: Bring all pain pills to every appointment (except for procedure appointments). 2. Pill Bottles: Bring pills in original pharmacy bottle. Always bring newest bottle. Bring bottle, even if empty. 3. Medication refills: You are responsible for knowing and keeping track of what medications you need refilled. The day before your appointment, write a list of all prescriptions that need to be refilled. Bring that list to your appointment and give it to the admitting nurse. Prescriptions will be written only during appointments. If you forget a medication, it will not be "Called in", "Faxed", or "electronically sent". You will need to get another appointment to get these prescribed. 4. Prescription Accuracy: You are responsible for carefully inspecting your prescriptions before leaving our office. Have the discharge nurse carefully go over each prescription with you, before taking them home. Make sure that your name is accurately spelled, that your address is correct. Check the name and dose of your medication to make sure it is accurate. Check the number of pills, and the written instructions to make sure they are clear and accurate. Make sure that you are given enough medication to  last until your next medication refill appointment. 5. Taking Medication: Take medication as prescribed. Never take more pills than instructed. Never take medication more frequently than prescribed. Taking less pills or less frequently is permitted and encouraged, when it comes to controlled substances (written prescriptions).  6. Inform other Doctors: Always inform, all of your healthcare providers, of all the medications you take. 7. Pain Medication from other Providers: You are not allowed to accept any additional pain medication from any other Doctor or Healthcare provider. There are two exceptions to this rule. (see below) In the event that you require additional pain medication, you are responsible for notifying us, as stated below. 8. Medication Agreement: You are responsible for carefully reading and following our Medication Agreement. This must be signed before receiving any prescriptions from our practice. Safely store a copy of your signed Agreement. Violations to the Agreement will result in no further prescriptions. (Additional copies of our Medication Agreement are available upon request.) 9. Laws, Rules, & Regulations: All patients are expected to follow all Federal and State Laws, Statutes, Rules, & Regulations. Ignorance of the Laws does not constitute a valid excuse. The use of any illegal substances is prohibited. 10. Adopted CDC guidelines & recommendations: Target dosing levels will be at or below 60 MME/day. Use of benzodiazepines** is not recommended.  Exceptions: There are only two exceptions to the rule of not receiving pain medications from other Healthcare Providers. 1. Exception #1 (Emergencies): In the event of an emergency (i.e.: accident requiring emergency care), you are allowed to receive additional pain medication. However, you are responsible for: As soon as you are able, call our office [phone], at any time of the day or night, and leave a message stating your  name, the date and nature of the emergency, and the name and dose of the medication   prescribed. In the event that your call is answered by a member of our staff, make sure to document and save the date, time, and the name of the person that took your information.  2. Exception #2 (Planned Surgery): In the event that you are scheduled by another doctor or dentist to have any type of surgery or procedure, you are allowed (for a period no longer than 30 days), to receive additional pain medication, for the acute post-op pain. However, in this case, you are responsible for picking up a copy of our "Post-op Pain Management for Surgeons" handout, and giving it to your surgeon or dentist. This document is available at our office, and does not require an appointment to obtain it. Simply go to our office during business hours (Monday-Thursday from 8:00 AM to 4:00 PM) (Friday 8:00 AM to 12:00 Noon) or if you have a scheduled appointment with us, prior to your surgery, and ask for it by name. In addition, you will need to provide us with your name, name of your surgeon, type of surgery, and date of procedure or surgery.  *Opioid medications include: morphine, codeine, oxycodone, oxymorphone, hydrocodone, hydromorphone, meperidine, tramadol, tapentadol, buprenorphine, fentanyl, methadone. **Benzodiazepine medications include: diazepam (Valium), alprazolam (Xanax), clonazepam (Klonopine), lorazepam (Ativan), clorazepate (Tranxene), chlordiazepoxide (Librium), estazolam (Prosom), oxazepam (Serax), temazepam (Restoril), triazolam (Halcion)  ____________________________________________________________________________________________  Epidural Steroid Injection Patient Information  Description: The epidural space surrounds the nerves as they exit the spinal cord.  In some patients, the nerves can be compressed and inflamed by a bulging disc or a tight spinal canal (spinal stenosis).  By injecting steroids into the  epidural space, we can bring irritated nerves into direct contact with a potentially helpful medication.  These steroids act directly on the irritated nerves and can reduce swelling and inflammation which often leads to decreased pain.  Epidural steroids may be injected anywhere along the spine and from the neck to the low back depending upon the location of your pain.   After numbing the skin with local anesthetic (like Novocaine), a small needle is passed into the epidural space slowly.  You may experience a sensation of pressure while this is being done.  The entire block usually last less than 10 minutes.  Conditions which may be treated by epidural steroids:   Low back and leg pain  Neck and arm pain  Spinal stenosis  Post-laminectomy syndrome  Herpes zoster (shingles) pain  Pain from compression fractures  Preparation for the injection:  3. Do not eat any solid food or dairy products within 8 hours of your appointment.  4. You may drink clear liquids up to 3 hours before appointment.  Clear liquids include water, black coffee, juice or soda.  No milk or cream please. 5. You may take your regular medication, including pain medications, with a sip of water before your appointment  Diabetics should hold regular insulin (if taken separately) and take 1/2 normal NPH dos the morning of the procedure.  Carry some sugar containing items with you to your appointment. 6. A driver must accompany you and be prepared to drive you home after your procedure.  7. Bring all your current medications with your. 8. An IV may be inserted and sedation may be given at the discretion of the physician.   9. A blood pressure cuff, EKG and other monitors will often be applied during the procedure.  Some patients may need to have extra oxygen administered for a short period. 10. You will be asked  to provide medical information, including your allergies, prior to the procedure.  We must know immediately if you  are taking blood thinners (like Coumadin/Warfarin)  Or if you are allergic to IV iodine contrast (dye). We must know if you could possible be pregnant.  Possible side-effects:  Bleeding from needle site  Infection (rare, may require surgery)  Nerve injury (rare)  Numbness & tingling (temporary)  Difficulty urinating (rare, temporary)  Spinal headache ( a headache worse with upright posture)  Light -headedness (temporary)  Pain at injection site (several days)  Decreased blood pressure (temporary)  Weakness in arm/leg (temporary)  Pressure sensation in back/neck (temporary)  Call if you experience:  Fever/chills associated with headache or increased back/neck pain.  Headache worsened by an upright position.  New onset weakness or numbness of an extremity below the injection site  Hives or difficulty breathing (go to the emergency room)  Inflammation or drainage at the infection site  Severe back/neck pain  Any new symptoms which are concerning to you  Please note:  Although the local anesthetic injected can often make your back or neck feel good for several hours after the injection, the pain will likely return.  It takes 3-7 days for steroids to work in the epidural space.  You may not notice any pain relief for at least that one week.  If effective, we will often do a series of three injections spaced 3-6 weeks apart to maximally decrease your pain.  After the initial series, we generally will wait several months before considering a repeat injection of the same type.  If you have any questions, please call [phone]  [facility]  You were given 3 prescriptions each for Oxycodone and Morphine.

## 2017-09-02 NOTE — Progress Notes (Signed)
Patient's Name: James Friedman  MRN: [id number]  Referring Provider: Gareth Morgan, MD  DOB: July 03, 1946  PCP: Gareth Morgan, MD  DOS: 09/02/2017  Note by: Vevelyn Francois NP  Service setting: Ambulatory outpatient  Specialty: Interventional Pain Management  Location: [facility] (AMB) Pain Management Facility    Patient type: Established    Primary Reason(s) for Visit: Encounter for prescription drug management. (Level of risk: moderate)  CC: Back Pain (lower)  HPI  James Friedman is a 71 y.o. year old, male patient, who comes today for a medication management evaluation. He has Hyperlipidemia; Chronic shoulder pain (Bilateral) (L>R); Non-traumatic rotator cuff tear; Chronic pain syndrome; Chronic low back pain (Primary Source of Pain) (Bilateral) (L>R); Lumbosacral radiculopathy at S1 (Right side); Lumbar facet syndrome (Bilateral) (L>R); Chronic neck pain (Bilateral) (R>L); Chronic cervical radicular pain (Bilateral) (R>L); Cervical spondylosis; Morbid obesity ([place]); Long term current use of opiate analgesic; Long term prescription opiate use; Opiate use (60 MME/Day); Opiate dependence ([place]); Chronic sacroiliac joint pain (Bilateral) (R>L); Failed neck surgery syndrome; Chronic upper extremity pain (Bilateral) (R>L); History of bilateral knee surgery; Diffuse myofascial pain syndrome; Lumbar facet hypertrophy (L4-5 and L5-S1); Carpal tunnel syndrome on right (Released); Transient weakness of left lower extremity; At high risk for falls; 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); Grade 1 Anterolisthesis of L4 over L5; Lumbar disc herniation (Left L4-5) (Right L5-S1); Chronic lower extremity pain (Secondary source of pain) (Bilateral) (R>L); Essential hypertension; Diabetes mellitus without complication ([place]); Atypical chest pain; Chronic knee pain (Bilateral); Diabetic peripheral neuropathy ([place]); Chronic lumbar radicular pain (Bilateral) (R>L) (S1 Dermatome on the left); Abnormal EMG/PNCV  (11/26/2015); Postoperative pain (post-radiofrequency); Lumbar spondylosis; COPD 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 ([place]); Polyneuropathy; Osteoarthritis of knee (Bilateral); Osteoarthritis of shoulder (Bilateral); Chronic shoulder Arthropathy (Bilateral); Cervical myofascial pain syndrome (Left trapezius); Primary osteoarthritis of left knee; Primary osteoarthritis of right knee; Suprapatellar bursitis of right knee; Chronic lumbar radiculopathy; Lumbar spinal stenosis (L3-4); and Polypharmacy on their problem list. His primarily concern today is the Back Pain (lower)  Pain Assessment: Location: Lower Back Radiating: entire right leg Onset: More than a month ago Duration: Chronic pain Quality: Aching Severity: 4 /10 (self-reported pain score)  Note: Reported level is compatible with observation.                          Effect on ADL:   Timing: Constant Modifying factors: changing position, exercising  James Friedman was last scheduled for an appointment on Visit date not found for medication management. During today's appointment we reviewed James Friedman chronic pain status, as well as his outpatient medication regimen. [facility] is apart of the [facility]. He has neck pain that radiates into both arms into the fingers. He states that the neck pain is worse. He states that he has numbness and tingling and weakness.  He is also having low back pain. He states  He has numbness and tingling and weakness. He uses a WC. He states that he has pain that oges into his ankle and feet. He feels like it is no the bottom of his feet.     The patient  reports that he does not use drugs. His body mass index is 43.12 kg/m.  Further details on both, my assessment(s), as well as the proposed treatment plan, please see below.  Controlled Substance Pharmacotherapy Assessment REMS (Risk Evaluation and Mitigation Strategy)  Analgesic: Morphine ER 15 mg every 12 hours (30 mg/day of morphine) + oxycodone  IR 5 mg every 6 (20 mg/day  of oxycodone) (total: 60 MME) MME/day: 60 mg/day.  James Martins, RN  09/02/2017 10:00 AM  Sign at close encounter Nursing Pain Medication Assessment:  Safety precautions to be maintained throughout the outpatient stay will include: orient to surroundings, keep bed in low position, maintain call bell within reach at all times, provide assistance with transfer out of bed and ambulation.  Medication Inspection Compliance: Pill count conducted under aseptic conditions, in front of the patient. Neither the pills nor the bottle was removed from the patient's sight at any time. Once count was completed pills were immediately returned to the patient in their original bottle.  Medication #1: Morphine ER (MSContin) Pill/Patch Count: 51 of 62 pills remain Pill/Patch Appearance: Markings consistent with prescribed medication Bottle Appearance: Standard pharmacy container. Clearly labeled. Filled Date: 11/29 / 2018 Last Medication intake:  Yesterday  Medication #2: Oxycodone IR Pill/Patch Count: 50 of 60 pills remain Pill/Patch Appearance: Markings consistent with prescribed medication Bottle Appearance: Standard pharmacy container. Clearly labeled. Filled Date: 11/29 / 2018 Last Medication intake:  Yesterday   Pharmacokinetics: Liberation and absorption (onset of action): WNL Distribution (time to peak effect): WNL Metabolism and excretion (duration of action): WNL         Pharmacodynamics: Desired effects: Analgesia: James Friedman reports >50% benefit. Functional ability: Patient reports that medication allows him to accomplish basic ADLs Clinically meaningful improvement in function (CMIF): Sustained CMIF goals met Perceived effectiveness: Described as relatively effective, allowing for increase in activities of daily living (ADL) Undesirable effects: Side-effects or Adverse reactions: None reported Monitoring: [place] PMP: Online review of the past 72-monthperiod conducted. Compliant with  practice rules and regulations Last UDS on record: Summary  Date Value Ref Range Status  03/03/2017 FINAL  Final    Comment:    ==================================================================== TOXASSURE SELECT 13 (MW) ==================================================================== Test                             Result       Flag       Units Drug Present and Declared for Prescription Verification   Morphine                       >4425        EXPECTED   ng/mg creat    Potential sources of large amounts of morphine in the absence of    codeine include administration of morphine or use of heroin.   Hydromorphone                  74           EXPECTED   ng/mg creat    Hydromorphone may be present as a metabolite of morphine;    concentrations of hydromorphone rarely exceed 5% of the morphine    concentration when this is the source of hydromorphone. Drug Absent but Declared for Prescription Verification   Oxycodone                      Not Detected UNEXPECTED ng/mg creat ==================================================================== Test                      Result    Flag   Units      Ref Range   Creatinine              226  mg/dL      >=20 ==================================================================== Declared Medications:  The flagging and interpretation on this report are based on the  following declared medications.  Unexpected results may arise from  inaccuracies in the declared medications.  **Note: The testing scope of this panel includes these medications:  Morphine (MS Contin)  Oxycodone  **Note: The testing scope of this panel does not include following  reported medications:  Acetaminophen  Albuterol  Aluminum  Amlodipine  Aspirin (Aspirin 81)  Atorvastatin  Azelastine  Capsaicin  Cetirizine  Chlorpheniramine  Clotrimazole  Diclofenac  Docusate  Duloxetine  Eye Drops  Fluticasone  Gabapentin  Glucagon  Hydroxyzine  Insulin  (Levemir)  Insulin (NovoLog)  Lidocaine (Lidoderm)  Lisinopril  Magnesium  Metformin (Glucophage)  Metformin (Janumet)  Methocarbamol (Robaxin)  Metoprolol (Lopressor)  Mometasone (Nasonex)  Mouthwash  Multivitamin  Naloxone  Naproxen  Omega-3 Fatty Acids  Omeprazole  Pantoprazole (Protonix)  Polyethylene Glycol  Promethazine  Sennosides  Sitagliptin (Janumet)  Testosterone  Topical  Trazodone  Vitamin A  Vitamin C  Vitamin D  Vitamin D2 (Ergocalciferol) ==================================================================== For clinical consultation, please call [phone]. ====================================================================    UDS interpretation: Compliant          Medication Assessment Form: Reviewed. Patient indicates being compliant with therapy Treatment compliance: Compliant Risk Assessment Profile: Aberrant behavior: See prior evaluations. None observed or detected today Comorbid factors increasing risk of overdose: See prior notes. No additional risks detected today Risk of substance use disorder (SUD): Low  ORT Scoring interpretation table:  Score <3 = Low Risk for SUD  Score between 4-7 = Moderate Risk for SUD  Score >8 = High Risk for Opioid Abuse   Risk Mitigation Strategies:  Patient Counseling: Covered Patient-Prescriber Agreement (PPA): Present and active  Notification to other healthcare providers: Done  Pharmacologic Plan: No change in therapy, at this time  Laboratory Chemistry  Inflammation Markers (CRP: Acute Phase) (ESR: Chronic Phase) Lab Results  Component Value Date   CRP 1.3 03/28/2017   ESRSEDRATE 3 03/28/2017                 Rheumatology Markers No results found for: Elayne Guerin, [facility]              Renal Function Markers Lab Results  Component Value Date   BUN 19 03/28/2017   CREATININE 0.79 03/28/2017   GFRAA 107 03/28/2017   GFRNONAA 92 03/28/2017                  Hepatic Function Markers Lab Results  Component Value Date   AST 19 03/28/2017   ALT 23 03/28/2017   ALBUMIN 4.3 03/28/2017   ALKPHOS 70 03/28/2017                 Electrolytes Lab Results  Component Value Date   NA 136 03/28/2017   K 5.1 03/28/2017   CL 91 (L) 03/28/2017   CALCIUM 9.1 03/28/2017   MG 1.8 03/28/2017                 Neuropathy Markers Lab Results  Component Value Date   VITAMINB12 255 03/28/2017                 Bone Pathology Markers Lab Results  Component Value Date   25OHVITD1 35 03/28/2017   25OHVITD2 14 03/28/2017   25OHVITD3 21 03/28/2017                 Coagulation Parameters  No results found for: INR, LABPROT, APTT, PLT, DDIMER               Cardiovascular Markers No results found for: BNP, CKTOTAL, CKMB, TROPONINI, HGB, HCT               CA Markers No results found for: CEA, CA125, LABCA2               Note: Lab results reviewed.  Recent Diagnostic Imaging Results  MR LUMBAR SPINE WO CONTRAST CLINICAL DATA:  Back pain. Injection last month which did little to help.  EXAM: MRI LUMBAR SPINE WITHOUT CONTRAST  TECHNIQUE: Multiplanar, multisequence MR imaging of the lumbar spine was performed. No intravenous contrast was administered.  COMPARISON:  07/25/2015  FINDINGS: Segmentation:  Standard  Alignment:  Grade 1 anterolisthesis at L4-5, facet mediated.  Vertebrae: No fracture, evidence of discitis, or aggressive bone lesion. T12 hemangioma.  Conus medullaris: Extends to the L1 level and appears normal.  Paraspinal and other soft tissues: Negative  Disc levels:  T12- L1: Unremarkable.  L1-L2: Spondylosis with posterior element hypertrophy. No impingement  L2-L3: Superiorly migrating disc extrusion that is newly seen. The disc nearly reaches the L2 superior endplate. Moderate spinal stenosis. Mild posterior element hypertrophy. Patent foramina.  L3-L4: Advanced degenerative disc narrowing with bulging  and endplate spurring. There is endplate sclerosis. Degenerated hypertrophic facets. Moderate bilateral foraminal narrowing. Moderate spinal stenosis.  L4-L5: Advanced facet arthropathy with grade 1 anterolisthesis. Mild narrowing of the disc. There is a left far-lateral disc protrusion which contacts and could irritate the L4 nerve root, chronic. Left subarticular recess stenosis with visible L5 impingement.  L5-S1:Degenerative facet arthropathy with asymmetric hypertrophy on the right. Disc narrowing with chronic right paracentral protrusion contacting the right S1 nerve root.  IMPRESSION: 1. Study compared to 2016. 2. New L2-3 superiorly migrating disc extrusion causing moderate spinal stenosis. 3. L3-4 advanced disc and facet degeneration with moderate spinal stenosis. Moderate bilateral foraminal narrowing. 4. L4-5 advanced facet arthropathy with anterolisthesis. Left subarticular recess stenosis with left L5 impingement. Chronic left extraforaminal protrusion with L4 impingement. 5. L5-S1 chronic right paracentral protrusion with right S1 contact but no mass-effect.  Electronically Signed   By: Monte Fantasia M.D.   On: 07/21/2017 14:18  Complexity Note: Imaging results reviewed. Results shared with Mr. Catterton, using Layman's terms.                         Meds   Current Outpatient Medications:  .  acetaminophen (TYLENOL) 325 MG tablet, Take 650 mg by mouth as needed. , Disp: , Rfl:  .  albuterol (PROAIR HFA) 108 (90 BASE) MCG/ACT inhaler, Inhale 2 puffs into the lungs every 6 (six) hours as needed for wheezing., Disp: , Rfl:  .  aluminum hydroxide (AMPHOJEL/ALTERNAGEL) 320 MG/5ML suspension, Take 30 mLs by mouth every 6 (six) hours as needed for indigestion or heartburn., Disp: , Rfl:  .  amLODipine (NORVASC) 5 MG tablet, Take 5 mg by mouth daily. , Disp: , Rfl:  .  antiseptic oral rinse (BIOTENE) LIQD, 15 mLs by Mouth Rinse route as needed for dry mouth., Disp: ,  Rfl:  .  aspirin 81 MG chewable tablet, Chew by mouth., Disp: , Rfl:  .  atorvastatin (LIPITOR) 80 MG tablet, Take 80 mg by mouth daily., Disp: , Rfl:  .  azelastine (ASTELIN) 0.1 % nasal spray, Place 1 spray into both nostrils as needed. Use in each nostril as  directed , Disp: , Rfl:  .  capsaicin (ZOSTRIX) 0.025 % cream, Apply 1 application topically 2 (two) times daily., Disp: , Rfl:  .  Carboxymethylcellul-Glycerin (OPTIVE) 0.5-0.9 % SOLN, Apply 1 drop to eye daily., Disp: , Rfl:  .  cetirizine (ZYRTEC) 10 MG tablet, Take 10 mg by mouth daily., Disp: , Rfl:  .  chlorpheniramine (CHLOR-TRIMETON) 4 MG tablet, Take 4 mg by mouth every 6 (six) hours as needed for allergies., Disp: , Rfl:  .  Clotrimazole 1 % OINT, Apply 1 application topically 2 (two) times daily. , Disp: , Rfl:  .  diclofenac sodium (VOLTAREN) 1 % GEL, Apply topically 2 (two) times daily as needed., Disp: , Rfl:  .  docusate sodium (COLACE) 100 MG capsule, Take 100 mg by mouth daily as needed for mild constipation., Disp: , Rfl:  .  DULoxetine (CYMBALTA) 30 MG capsule, Take 30 mg by mouth 2 (two) times daily., Disp: , Rfl:  .  ergocalciferol (VITAMIN D2) 50000 units capsule, Take 50,000 Units by mouth every 30 (thirty) days., Disp: , Rfl:  .  fluticasone (FLONASE) 50 MCG/ACT nasal spray, , Disp: , Rfl:  .  gabapentin (NEURONTIN) 600 MG tablet, Take 600 mg by mouth 2 (two) times daily. One capsule in the am and two capsules at night, Disp: , Rfl:  .  glucagon (GLUCAGON EMERGENCY) 1 MG injection, Inject 1 mg into the vein once as needed., Disp: , Rfl:  .  hydrOXYzine (ATARAX/VISTARIL) 25 MG tablet, Take 25 mg by mouth every 6 (six) hours as needed for itching., Disp: , Rfl:  .  insulin aspart (NOVOLOG) 100 UNIT/ML injection, Inject 70 Units into the skin 4 (four) times daily -  before meals and at bedtime. , Disp: , Rfl:  .  insulin detemir (LEVEMIR) 100 UNIT/ML injection, Inject 80 Units into the skin 2 (two) times daily. , Disp: ,  Rfl:  .  lidocaine (LIDODERM) 5 %, Place 1 patch onto the skin daily. Remove & Discard patch within 12 hours or as directed by MD, Disp: , Rfl:  .  lisinopril (PRINIVIL,ZESTRIL) 10 MG tablet, Take 10 mg by mouth daily., Disp: , Rfl:  .  Magnesium 400 MG TABS, Take 1 tablet by mouth 2 (two) times daily. , Disp: , Rfl:  .  metFORMIN (GLUCOPHAGE) 1000 MG tablet, Take 1,000 mg by mouth 2 (two) times daily with a meal., Disp: , Rfl:  .  methocarbamol (ROBAXIN) 500 MG tablet, Take 500 mg by mouth 2 (two) times daily. , Disp: , Rfl:  .  metoprolol (LOPRESSOR) 100 MG tablet, Take 100 mg by mouth 2 (two) times daily., Disp: , Rfl:  .  mometasone (NASONEX) 50 MCG/ACT nasal spray, Place 2 sprays into the nose daily., Disp: , Rfl:  .  morphine (MS CONTIN) 15 MG 12 hr tablet, Take 1 tablet (15 mg total) by mouth every 12 (twelve) hours., Disp: 60 tablet, Rfl: 0 .  Multiple Vitamin (MULTIVITAMIN) tablet, Take 1 tablet by mouth daily., Disp: , Rfl:  .  naloxone (NARCAN) nasal spray 4 mg/0.1 mL, Place 1 spray into the nose., Disp: , Rfl:  .  naproxen (NAPROSYN) 500 MG tablet, Take 500 mg by mouth 2 (two) times daily with a meal., Disp: , Rfl:  .  Omega-3 Fatty Acids (FISH OIL) 1000 MG CAPS, Take by mouth 2 (two) times daily., Disp: , Rfl:  .  omeprazole (PRILOSEC) 20 MG capsule, Take 20 mg by mouth daily., Disp: , Rfl:  .  oxyCODONE (OXY IR/ROXICODONE) 5 MG immediate release tablet, Take 1 tablet (5 mg total) by mouth every 4 (four) hours as needed for severe pain., Disp: 120 tablet, Rfl: 0 .  OXYGEN, Inhale 3 L into the lungs at bedtime., Disp: , Rfl:  .  pantoprazole (PROTONIX) 40 MG tablet, Take 40 mg by mouth., Disp: , Rfl:  .  polyethylene glycol powder (GLYCOLAX/MIRALAX) powder, Take by mouth as needed. , Disp: , Rfl:  .  promethazine (PHENERGAN) 12.5 MG tablet, Take by mouth., Disp: , Rfl:  .  senna (SENOKOT) 8.6 MG TABS tablet, Take 1 tablet by mouth daily as needed for mild constipation., Disp: , Rfl:   .  sitaGLIPtin-metformin (JANUMET) 50-1000 MG tablet, Take 1 tablet by mouth 2 (two) times daily with a meal., Disp: , Rfl:  .  testosterone cypionate (DEPOTESTOTERONE CYPIONATE) 200 MG/ML injection, Inject 200 mg into the muscle every 14 (fourteen) days., Disp: , Rfl:  .  traZODone (DESYREL) 150 MG tablet, 150 mg daily. , Disp: , Rfl:  .  vitamin C (ASCORBIC ACID) 500 MG tablet, Take 500 mg by mouth daily., Disp: , Rfl:  .  Vitamins A & D (VITAMIN A & D) ointment, Apply 1 application topically as needed for dry skin., Disp: , Rfl:  .  morphine (MS CONTIN) 15 MG 12 hr tablet, Take 1 tablet (15 mg total) by mouth every 12 (twelve) hours., Disp: 60 tablet, Rfl: 0 .  morphine (MS CONTIN) 15 MG 12 hr tablet, Take 1 tablet (15 mg total) by mouth every 12 (twelve) hours., Disp: 60 tablet, Rfl: 0 .  oxyCODONE (OXY IR/ROXICODONE) 5 MG immediate release tablet, Take 1 tablet (5 mg total) by mouth every 4 (four) hours as needed for severe pain., Disp: 120 tablet, Rfl: 0 .  oxyCODONE (OXY IR/ROXICODONE) 5 MG immediate release tablet, Take 1 tablet (5 mg total) by mouth every 6 (six) hours as needed for severe pain., Disp: 120 tablet, Rfl: 0  ROS  Constitutional: Denies any fever or chills Gastrointestinal: No reported hemesis, hematochezia, vomiting, or acute GI distress Musculoskeletal: Denies any acute onset joint swelling, redness, loss of ROM, or weakness Neurological: No reported episodes of acute onset apraxia, aphasia, dysarthria, agnosia, amnesia, paralysis, loss of coordination, or loss of consciousness  Allergies  Mr. Rosenberry has No Known Allergies.  PFSH  Drug: Mr. Laverdure  reports that he does not use drugs. Alcohol:  reports that he does not drink alcohol. Tobacco:  reports that he has quit smoking. His smoking use included cigarettes. He has a 3.75 pack-year smoking history. His smokeless tobacco use includes chew. Medical:  has a past medical history of Anxiety, Asthma, Atypical chest  pain, Back pain, BPH (benign prostatic hypertrophy), Carpal tunnel syndrome, Carpal tunnel syndrome on right (07/17/2015), Chest pain (07/26/2013), DDD (degenerative disc disease), Diabetes mellitus without complication ([place]), Essential hypertension, Falls, GERD (gastroesophageal reflux disease), Heme positive stool, History of bilateral knee surgery (07/17/2015), Hyperlipidemia, Hypertension, Morbid obesity ([place]), OA (osteoarthritis), OSA (obstructive sleep apnea), Ventral hernia, and Wears dentures. Surgical: Mr. Keys  has a past surgical history that includes Knee surgery (Bilateral); Neck surgery; Carpal tunnel release; Shoulder surgery (Right); Cataract extraction, extracapsular (Left, 08/04/2015); and Cataract extraction w/PHACO (Right, 04/19/2016). Family: family history includes Heart attack in his father.  Constitutional Exam  General appearance: Well nourished, well developed, and well hydrated. In no apparent acute distress Vitals:   09/02/17 0953  BP: 131/79  Pulse: 96  Resp: 18  Temp: 98.9 F (37.2  C)  TempSrc: Oral  SpO2: 100%  Weight: 292 lb (132.5 kg)  Height: '5\' 9"'$  (1.753 m)   BMI Assessment: Estimated body mass index is 43.12 kg/m as calculated from the following:   Height as of this encounter: '5\' 9"'$  (1.753 m).   Weight as of this encounter: 292 lb (132.5 kg).  BMI interpretation table: BMI level Category Range association with higher incidence of chronic pain  <18 kg/m2 Underweight   18.5-24.9 kg/m2 Ideal body weight   25-29.9 kg/m2 Overweight Increased incidence by 20%  30-34.9 kg/m2 Obese (Class I) Increased incidence by 68%  35-39.9 kg/m2 Severe obesity (Class II) Increased incidence by 136%  >40 kg/m2 Extreme obesity (Class III) Increased incidence by 254%   BMI Readings from Last 4 Encounters:  09/02/17 43.12 kg/m  06/27/17 41.64 kg/m  06/07/17 41.64 kg/m  05/31/17 41.64 kg/m   Wt Readings from Last 4 Encounters:  09/02/17 292 lb (132.5 kg)   06/27/17 282 lb (127.9 kg)  06/07/17 282 lb (127.9 kg)  05/31/17 282 lb (127.9 kg)  Psych/Mental status: Alert, oriented x 3 (person, place, & time)       Eyes: PERLA Respiratory: No evidence of acute respiratory distress  Cervical Spine Area Exam  Skin & Axial Inspection: No masses, redness, edema, swelling, or associated skin lesions Alignment: Symmetrical Functional ROM: Unrestricted ROM      Stability: No instability detected Muscle Tone/Strength: Functionally intact. No obvious neuro-muscular anomalies detected. Sensory (Neurological): Unimpaired Palpation: Complains of area being tender to palpation              Upper Extremity (UE) Exam    Side: Right upper extremity  Side: Left upper extremity  Skin & Extremity Inspection: Skin color, temperature, and hair growth are WNL. No peripheral edema or cyanosis. No masses, redness, swelling, asymmetry, or associated skin lesions. No contractures.  Skin & Extremity Inspection: Skin color, temperature, and hair growth are WNL. No peripheral edema or cyanosis. No masses, redness, swelling, asymmetry, or associated skin lesions. No contractures.  Functional ROM: Unrestricted ROM          Functional ROM: Unrestricted ROM          Muscle Tone/Strength: Functionally intact. No obvious neuro-muscular anomalies detected.  Muscle Tone/Strength: Functionally intact. No obvious neuro-muscular anomalies detected.  Sensory (Neurological): Unimpaired          Sensory (Neurological): Unimpaired          Palpation: No palpable anomalies              Palpation: No palpable anomalies              Specialized Test(s): Deferred         Specialized Test(s): Deferred          Thoracic Spine Area Exam  Skin & Axial Inspection: No masses, redness, or swelling Alignment: Symmetrical Functional ROM: Unrestricted ROM Stability: No instability detected Muscle Tone/Strength: Functionally intact. No obvious neuro-muscular anomalies detected. Sensory  (Neurological): Unimpaired Muscle strength & Tone: No palpable anomalies  Lumbar Spine Area Exam  Skin & Axial Inspection: No masses, redness, or swelling Alignment: Symmetrical Functional ROM: Unrestricted ROM      Stability: No instability detected Muscle Tone/Strength: Functionally intact. No obvious neuro-muscular anomalies detected. Sensory (Neurological): Unimpaired Palpation: Complains of area being tender to palpation       Provocative Tests: Lumbar Hyperextension and rotation test: evaluation deferred today       Lumbar Lateral bending test: evaluation deferred today  Patrick's Maneuver: evaluation deferred today                    Gait & Posture Assessment  Ambulation: Patient ambulates using a wheel chair Gait: Relatively normal for age and body habitus Posture: WNL   Lower Extremity Exam    Side: Right lower extremity  Side: Left lower extremity  Skin & Extremity Inspection: Skin color, temperature, and hair growth are WNL. No peripheral edema or cyanosis. No masses, redness, swelling, asymmetry, or associated skin lesions. No contractures.  Skin & Extremity Inspection: Skin color, temperature, and hair growth are WNL. No peripheral edema or cyanosis. No masses, redness, swelling, asymmetry, or associated skin lesions. No contractures.  Functional ROM: Unrestricted ROM          Functional ROM: Unrestricted ROM          Muscle Tone/Strength: Functionally intact. No obvious neuro-muscular anomalies detected.  Muscle Tone/Strength: Functionally intact. No obvious neuro-muscular anomalies detected.  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 Diagnoses of Chronic low back pain (Primary Source of Pain) (Bilateral)  (L>R), Chronic neck pain (Bilateral) (R>L), Chronic lower extremity pain (Secondary source of pain) (Bilateral) (R>L), and Chronic pain syndrome were also pertinent to this visit.  Status Diagnosis  Controlled Controlled Controlled 1. 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 Chronic low back pain (Primary Source of Pain) (Bilateral) (L>R)   3. Chronic neck pain (Bilateral) (R>L)   4. Chronic lower extremity pain (Secondary source of pain) (Bilateral) (R>L)   5. Chronic pain syndrome     Problems updated and reviewed during this visit: No problems updated. Plan of Care  Pharmacotherapy (Medications Ordered): No orders of the defined types were placed in this encounter. This SmartLink is deprecated. Use AVSMEDLIST instead to display the medication list for a patient. Medications administered today: Mitzie Na. Raske had no medications administered during this visit. Lab-work, procedure(s), and/or referral(s): No orders of the defined types were placed in this encounter.  Imaging and/or referral(s): None  Interventional management options: Planned, scheduled, and/or pending:   Requesting Bilateral LESI with sedation    Considering:   Diagnostic bilateral suprascapular nerve block Diagnosticbilateral series of 5 intra-articular Hyalgan knee injection Palliative bilateral lumbar facet radiofrequencyablation. (Right done 07/08/2016; 10/02/15 before that. Left done 12/09/2015)  Right intra-articular shoulder injection Possible intra-articular right knee injection Palliative right cervical epidural steroidinjection  Palliative bilateral genicular nerve RFA.    Palliative PRN treatment(s):   Palliative bilateral lumbar facet block Palliative bilateral intra-articular Hyalgan knee injection   Provider-requested follow-up: Return in about 3 months (around 12/01/2017) for MedMgmt with [facility]).  No future appointments. Primary  Care Physician: Gareth Morgan, MD Location: [facility] Note by: Vevelyn Francois NP Date: 09/02/2017; Time: 10:35 AM  Pain Score Disclaimer: We use the NRS-11 scale. This is a self-reported, subjective measurement of pain severity with only modest accuracy. It is used primarily to identify changes within a particular patient. It must be understood that outpatient pain scales are significantly less accurate that those used for research, where they can be applied under ideal controlled circumstances with minimal exposure to variables. In reality, the score is likely to be a combination of pain intensity and  pain affect, where pain affect describes the degree of emotional arousal or changes in action readiness caused by the sensory experience of pain. Factors such as social and work situation, setting, emotional state, anxiety levels, expectation, and prior pain experience may influence pain perception and show large inter-individual differences that may also be affected by time variables.  Patient instructions provided during this appointment: Patient Instructions   ____________________________________________________________________________________________  Medication Rules  Applies to: All patients receiving prescriptions (written or electronic).  Pharmacy of record: Pharmacy where electronic prescriptions will be sent. If written prescriptions are taken to a different pharmacy, please inform the nursing staff. The pharmacy listed in the electronic medical record should be the one where you would like electronic prescriptions to be sent.  Prescription refills: Only during scheduled appointments. Applies to both, written and electronic prescriptions.  NOTE: The following applies primarily to controlled substances (Opioid* Pain Medications).   Patient's responsibilities: 1. Pain Pills: Bring all pain pills to every appointment (except for procedure  appointments). 2. Pill Bottles: Bring pills in original pharmacy bottle. Always bring newest bottle. Bring bottle, even if empty. 3. Medication refills: You are responsible for knowing and keeping track of what medications you need refilled. The day before your appointment, write a list of all prescriptions that need to be refilled. Bring that list to your appointment and give it to the admitting nurse. Prescriptions will be written only during appointments. If you forget a medication, it will not be "Called in", "Faxed", or "electronically sent". You will need to get another appointment to get these prescribed. 4. Prescription Accuracy: You are responsible for carefully inspecting your prescriptions before leaving our office. Have the discharge nurse carefully go over each prescription with you, before taking them home. Make sure that your name is accurately spelled, that your address is correct. Check the name and dose of your medication to make sure it is accurate. Check the number of pills, and the written instructions to make sure they are clear and accurate. Make sure that you are given enough medication to last until your next medication refill appointment. 5. Taking Medication: Take medication as prescribed. Never take more pills than instructed. Never take medication more frequently than prescribed. Taking less pills or less frequently is permitted and encouraged, when it comes to controlled substances (written prescriptions).  6. Inform other Doctors: Always inform, all of your healthcare providers, of all the medications you take. 7. Pain Medication from other Providers: You are not allowed to accept any additional pain medication from any other Doctor or Healthcare provider. There are two exceptions to this rule. (see below) In the event that you require additional pain medication, you are responsible for notifying us, as stated below. 8. Medication Agreement: You are responsible for carefully  reading and following our Medication Agreement. This must be signed before receiving any prescriptions from our practice. Safely store a copy of your signed Agreement. Violations to the Agreement will result in no further prescriptions. (Additional copies of our Medication Agreement are available upon request.) 9. Laws, Rules, & Regulations: All patients are expected to follow all Federal and [employer], [employer], Rules, [employer]. Ignorance of the Laws does not constitute a valid excuse. The use of any illegal substances is prohibited. 10. Adopted CDC guidelines & recommendations: Target dosing levels will be at or below 60 MME/day. Use of benzodiazepines** is not recommended.  Exceptions: There are only two exceptions to the rule of not receiving pain medications from other Healthcare Providers. 1.  Exception #1 (Emergencies): In the event of an emergency (i.e.: accident requiring emergency care), you are allowed to receive additional pain medication. However, you are responsible for: As soon as you are able, call our office [phone], at any time of the day or night, and leave a message stating your name, the date and nature of the emergency, and the name and dose of the medication prescribed. In the event that your call is answered by a member of our staff, make sure to document and save the date, time, and the name of the person that took your information.  2. Exception #2 (Planned Surgery): In the event that you are scheduled by another doctor or dentist to have any type of surgery or procedure, you are allowed (for a period no longer than 30 days), to receive additional pain medication, for the acute post-op pain. However, in this case, you are responsible for picking up a copy of our "Post-op Pain Management for Surgeons" handout, and giving it to your surgeon or dentist. This document is available at our office, and does not require an appointment to obtain it. Simply go to our office during  business hours (Monday-Thursday from 8:00 AM to 4:00 PM) (Friday 8:00 AM to 12:00 Noon) or if you have a scheduled appointment with [place], prior to your surgery, and ask for it by name. In addition, you will need to provide [place] with your name, name of your surgeon, type of surgery, and date of procedure or surgery.  *Opioid medications include: morphine, codeine, oxycodone, oxymorphone, hydrocodone, hydromorphone, meperidine, tramadol, tapentadol, buprenorphine, fentanyl, methadone. **Benzodiazepine medications include: diazepam (Valium), alprazolam (Xanax), clonazepam (Klonopine), lorazepam (Ativan), clorazepate (Tranxene), chlordiazepoxide (Librium), estazolam (Prosom), oxazepam (Serax), temazepam (Restoril), triazolam (Halcion)  ____________________________________________________________________________________________

## 2017-09-02 NOTE — Progress Notes (Signed)
Nursing Pain Medication Assessment:  Safety precautions to be maintained throughout the outpatient stay will include: orient to surroundings, keep bed in low position, maintain call bell within reach at all times, provide assistance with transfer out of bed and ambulation.  Medication Inspection Compliance: Pill count conducted under aseptic conditions, in front of the patient. Neither the pills nor the bottle was removed from the patient's sight at any time. Once count was completed pills were immediately returned to the patient in their original bottle.  Medication #1: Morphine ER (MSContin) Pill/Patch Count: 51 of 62 pills remain Pill/Patch Appearance: Markings consistent with prescribed medication Bottle Appearance: Standard pharmacy container. Clearly labeled. Filled Date: 11/29 / 2018 Last Medication intake:  Yesterday  Medication #2: Oxycodone IR Pill/Patch Count: 50 of 60 pills remain Pill/Patch Appearance: Markings consistent with prescribed medication Bottle Appearance: Standard pharmacy container. Clearly labeled. Filled Date: 11/29 / 2018 Last Medication intake:  Yesterday

## 2017-09-09 LAB — TOXASSURE SELECT 13 (MW), URINE

## 2017-09-09 NOTE — Progress Notes (Signed)
Results were reviewed and found to be: abnormal  Surgical consultation is recommended: Needs referral to NS for evaluation. Send to Nutritionist in preparation for possible back surgery. Referral to Bariatric surgery may also be useful.  Review would suggest interventional pain management techniques may be of benefit: L2-3 LESI

## 2017-09-26 ENCOUNTER — Telehealth: Payer: Self-pay | Admitting: Pain Medicine

## 2017-09-26 DIAGNOSIS — Z79891 Long term (current) use of opiate analgesic: Secondary | ICD-10-CM

## 2017-09-26 DIAGNOSIS — G894 Chronic pain syndrome: Secondary | ICD-10-CM

## 2017-09-26 NOTE — Telephone Encounter (Signed)
Patient's Name: James Friedman  MRN: [id number]  Patient's Phone: @HOMEPHONE @  DOB: 04-03-1946  Referring Provider: No ref. provider found  DOS: 09/26/2017  PCP: Bobbye Mortoneilly, Sharon A, MD  Service setting: Ambulatory outpatient  Note by: Sydnee LevansFrancisco A. Laban EmperorNaveira, MD  Encounter: Phone call  Specialty: Interventional Pain Management  Patient type: Established   Encounter: Provider initiated.  Re: Medication refill.  The patient's insurance company has indicated that they will not pay for the patient to come into the pain clinic for medication refills and that these have to be done by the patient primary care physician.  In view of this, we have talked to the PCP (Dr. Zoila Shutterosenberg) and she has agreed to continue writing for the medicine.  [place]: Dr. Laban EmperorNaveira  Topic Discussed: Clarification.  Disposition: From this point on, the patient will have his prescriptions written by Dr. Zoila Shutterosenberg.

## 2017-09-28 NOTE — Progress Notes (Signed)
Patient's Name: James Friedman  MRN: [id number]  Referring Provider: Barbette Merino, NP  DOB: Dec 26, 1945  PCP: Bobbye Morton, MD  DOS: 09/29/2017  Note by: Oswaldo Done, MD  Service setting: Ambulatory outpatient  Specialty: Interventional Pain Management  Patient type: Established  Location: [facility] (AMB) Pain Management Facility  Visit type: Interventional Procedure   Primary Reason for Visit: Interventional Pain Management Treatment. CC: Back Pain (upper and lower)  Procedure:  Anesthesia, Analgesia, Anxiolysis:  Type: Therapeutic Inter-Laminar Epidural Steroid Injection Region: Lumbar Level: L2-3 Level. Laterality: Right-Sided Paramedial  Type: Local Anesthesia Local Anesthetic: Lidocaine 1% Route: Infiltration ([place]/IM) IV Access: Declined Sedation: Declined  Indication(s): Analgesia           Indications: 1. Chronic low back pain (Primary Source of Pain) (Bilateral) (L>R)   2. Chronic lumbar radicular pain (Bilateral) (R>L) (S1 Dermatome on the left)   3. Chronic lumbar radiculopathy   4. 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 Lumbar spondylosis   6. Chronic bilateral low back pain without sciatica   7. Chronic radicular lumbar pain   8. Lumbar foraminal stenosis   9. Spondylosis without myelopathy or radiculopathy, lumbar region    Pain Score: Pre-procedure: 5 /10 Post-procedure: 0-No pain/10  Pre-op Assessment:  James Friedman is a 72 y.o. (year old), male patient, seen today for interventional treatment. He  has a past surgical history that includes Knee surgery (Bilateral); Neck surgery; Carpal tunnel release; Shoulder surgery (Right); Cataract extraction, extracapsular (Left, 08/04/2015); and Cataract extraction w/PHACO (Right, 04/19/2016). James Friedman has a current medication list which includes the following prescription(s): acetaminophen, albuterol, aluminum hydroxide, amlodipine, antiseptic oral rinse, aspirin, atorvastatin, azelastine,  capsaicin, carboxymethylcellul-glycerin, cetirizine, chlorpheniramine, clotrimazole, diclofenac sodium, docusate sodium, duloxetine, ergocalciferol, fluticasone, gabapentin, glucagon, hydroxyzine, insulin aspart, insulin detemir, lidocaine, lisinopril, magnesium, metformin, methocarbamol, metoprolol tartrate, mometasone, morphine, morphine, morphine, multivitamin, naloxone, naproxen, fish oil, omeprazole, oxycodone, oxycodone, oxycodone, oxygen-helium, pantoprazole, polyethylene glycol powder, promethazine, senna, sitagliptin-metformin, testosterone cypionate, trazodone, vitamin c, and vitamin a & d, and the following Facility-Administered Medications: fentanyl, lactated ringers, and midazolam. His primarily concern today is the Back Pain (upper and lower)  Initial Vital Signs: Blood pressure (!) 141/89, pulse 84, temperature 98 F (36.7 C), resp. rate 16, height 5\' 9"  (1.753 m), weight 292 lb (132.5 kg), SpO2 97 %. BMI: Estimated body mass index is 43.12 kg/m as calculated from the following:   Height as of this encounter: 5\' 9"  (1.753 m).   Weight as of this encounter: 292 lb (132.5 kg).  Risk Assessment: Allergies: Reviewed. He has No Known Allergies.  Allergy Precautions: None required Coagulopathies: Reviewed. None identified.  Blood-thinner therapy: None at this time Active Infection(s): Reviewed. None identified. James Friedman is afebrile  Site Confirmation: James Friedman was asked to confirm the procedure and laterality before marking the site Procedure checklist: Completed Consent: Before the procedure and under the influence of no sedative(s), amnesic(s), or anxiolytics, the patient was informed of the treatment options, risks and possible complications. To fulfill our ethical and legal obligations, as recommended by the American Medical Association's Code of Ethics, I have informed the patient of my clinical impression; the nature and purpose of the treatment or procedure; the risks,  benefits, and possible complications of the intervention; the alternatives, including doing nothing; the risk(s) and benefit(s) of the alternative treatment(s) or procedure(s); and the risk(s) and benefit(s) of doing nothing. The patient was provided information about the general risks and possible complications associated with the procedure.  These may include, but are not limited to: failure to achieve desired goals, infection, bleeding, organ or nerve damage, allergic reactions, paralysis, and death. In addition, the patient was informed of those risks and complications associated to Spine-related procedures, such as failure to decrease pain; infection (i.e.: Meningitis, epidural or intraspinal abscess); bleeding (i.e.: epidural hematoma, subarachnoid hemorrhage, or any other type of intraspinal or peri-dural bleeding); organ or nerve damage (i.e.: Any type of peripheral nerve, nerve root, or spinal cord injury) with subsequent damage to sensory, motor, and/or autonomic systems, resulting in permanent pain, numbness, and/or weakness of one or several areas of the body; allergic reactions; (i.e.: anaphylactic reaction); and/or death. Furthermore, the patient was informed of those risks and complications associated with the medications. These include, but are not limited to: allergic reactions (i.e.: anaphylactic or anaphylactoid reaction(s)); adrenal axis suppression; blood sugar elevation that in diabetics may result in ketoacidosis or comma; water retention that in patients with history of congestive heart failure may result in shortness of breath, pulmonary edema, and decompensation with resultant heart failure; weight gain; swelling or edema; medication-induced neural toxicity; particulate matter embolism and blood vessel occlusion with resultant organ, and/or nervous system infarction; and/or aseptic necrosis of one or more joints. Finally, the patient was informed that Medicine is not an exact science;  therefore, there is also the possibility of unforeseen or unpredictable risks and/or possible complications that may result in a catastrophic outcome. The patient indicated having understood very clearly. We have given the patient no guarantees and we have made no promises. Enough time was given to the patient to ask questions, all of which were answered to the patient's satisfaction. James Friedman has indicated that he wanted to continue with the procedure. Attestation: I, the ordering provider, attest that I have discussed with the patient the benefits, risks, side-effects, alternatives, likelihood of achieving goals, and potential problems during recovery for the procedure that I have provided informed consent. Date: 09/29/2017; Time: 10:17 AM  Pre-Procedure Preparation:  Monitoring: As per clinic protocol. Respiration, ETCO2, SpO2, BP, heart rate and rhythm monitor placed and checked for adequate function Safety Precautions: Patient was assessed for positional comfort and pressure points before starting the procedure. Time-out: I initiated and conducted the "Time-out" before starting the procedure, as per protocol. The patient was asked to participate by confirming the accuracy of the "Time Out" information. Verification of the correct person, site, and procedure were performed and confirmed by me, the nursing staff, and the patient. "Time-out" conducted as per Joint Commission's Universal Protocol (UP.01.01.01). "Time-out" Date & Time: 09/29/2017; 0942 hrs.  Description of Procedure Process:   Position: Prone with head of the table was raised to facilitate breathing. Target Area: The interlaminar space, initially targeting the lower laminar border of the superior vertebral body. Approach: Paramedial approach. Area Prepped: Entire Posterior Lumbar Region Prepping solution: ChloraPrep (2% chlorhexidine gluconate and 70% isopropyl alcohol) Safety Precautions: Aspiration looking for blood return was  conducted prior to all injections. At no point did we inject any substances, as a needle was being advanced. No attempts were made at seeking any paresthesias. Safe injection practices and needle disposal techniques used. Medications properly checked for expiration dates. SDV (single dose vial) medications used. Description of the Procedure: Protocol guidelines were followed. The procedure needle was introduced through the skin, ipsilateral to the reported pain, and advanced to the target area. Bone was contacted and the needle walked caudad, until the lamina was cleared. The epidural space was identified using "loss-of-resistance technique"  with 2-3 ml of PF-NaCl (0.9% NSS), in a 5cc LOR glass syringe. Vitals:   09/29/17 0940 09/29/17 0948 09/29/17 0953 09/29/17 0958  BP: (!) 143/94 (!) 143/97 (!) 140/92 (!) 141/89  Pulse: 84 84 85 84  Resp: 16 14 15 16   Temp:      SpO2: 96% 97% 96% 97%  Weight:      Height:        Start Time: 0942 hrs. End Time: 0951 hrs. Materials:  Needle(s) Type: Epidural needle Gauge: 17G Length: 3.5-in Medication(s): We administered lidocaine, sodium chloride flush, ropivacaine (PF) 2 mg/mL (0.2%), and dexamethasone. Please see chart orders for dosing details.  Imaging Guidance (Spinal):  Type of Imaging Technique: Fluoroscopy Guidance (Spinal) Indication(s): Assistance in needle guidance and placement for procedures requiring needle placement in or near specific anatomical locations not easily accessible without such assistance. Exposure Time: Please see nurses notes. Contrast: Before injecting any contrast, we confirmed that the patient did not have an allergy to iodine, shellfish, or radiological contrast. Once satisfactory needle placement was completed at the desired level, radiological contrast was injected. Contrast injected under live fluoroscopy. No contrast complications. See chart for type and volume of contrast used. Fluoroscopic Guidance: I was personally  present during the use of fluoroscopy. "Tunnel Vision Technique" used to obtain the best possible view of the target area. Parallax error corrected before commencing the procedure. "Direction-depth-direction" technique used to introduce the needle under continuous pulsed fluoroscopy. Once target was reached, antero-posterior, oblique, and lateral fluoroscopic projection used confirm needle placement in all planes. Images permanently stored in EMR. Interpretation: I personally interpreted the imaging intraoperatively. Adequate needle placement confirmed in multiple planes. Appropriate spread of contrast into desired area was observed. No evidence of afferent or efferent intravascular uptake. No intrathecal or subarachnoid spread observed. Permanent images saved into the patient's record.  Antibiotic Prophylaxis:  Indication(s): None identified Antibiotic given: None  Post-operative Assessment:  EBL: None Complications: No immediate post-treatment complications observed by team, or reported by patient. Note: The patient tolerated the entire procedure well. A repeat set of vitals were taken after the procedure and the patient was kept under observation following institutional policy, for this type of procedure. Post-procedural neurological assessment was performed, showing return to baseline, prior to discharge. The patient was provided with post-procedure discharge instructions, including a section on how to identify potential problems. Should any problems arise concerning this procedure, the patient was given instructions to immediately contact us, at any time, without hesitation. In any case, we plan to contact the patient by telephone for a follow-up status report regarding this interventional procedure. Comments:  No additional relevant information.  Plan of Care   Imaging Orders     DG C-Arm 1-60 Min-No Report  Procedure Orders     Lumbar Transforaminal Epidural  Medications ordered for  procedure: Meds ordered this encounter  Medications  . midazolam (VERSED) 5 MG/5ML injection 1-2 mg    Make sure Flumazenil is available in the pyxis when using this medication. If oversedation occurs, administer 0.2 mg IV over 15 sec. If after 45 sec no response, administer 0.2 mg again over 1 min; may repeat at 1 min intervals; not to exceed 4 doses (1 mg)  . fentaNYL (SUBLIMAZE) injection 25-50 mcg    Make sure Narcan is available in the pyxis when using this medication. In the event of respiratory depression (RR< 8/min): Titrate NARCAN (naloxone) in increments of 0.1 to 0.2 mg IV at 2-3 minute intervals, until desired degree  of reversal.  . lactated ringers infusion 1,000 mL  . lidocaine (XYLOCAINE) 2 % (with pres) injection 400 mg  . sodium chloride flush (NS) 0.9 % injection 1 mL  . ropivacaine (PF) 2 mg/mL (0.2%) (NAROPIN) injection 1 mL  . dexamethasone (DECADRON) injection 10 mg   Medications administered: We administered lidocaine, sodium chloride flush, ropivacaine (PF) 2 mg/mL (0.2%), and dexamethasone.  See the medical record for exact dosing, route, and time of administration.  New Prescriptions   No medications on file   Disposition: Discharge home  Discharge Date & Time: 09/29/2017; 0958 hrs.   Physician-requested Follow-up: Return for post-procedure eval (2 wks), w/ Dr. Laban Emperor. Future Appointments  Date Time Provider Department Center  10/17/2017  9:15 AM Delano Metz, MD [facility] None  11/29/2017  9:30 AM Barbette Merino, NP [facility] None   Primary Care Physician: Bobbye Morton, MD Location: [facility] Note by: Oswaldo Done, MD Date: 09/29/2017; Time: 10:19 AM  Disclaimer:  Medicine is not an exact science. The only guarantee in medicine is that nothing is guaranteed. It is important to note that the decision to proceed with this intervention was based on the information collected from the patient. The Data and  conclusions were drawn from the patient's questionnaire, the interview, and the physical examination. Because the information was provided in large part by the patient, it cannot be guaranteed that it has not been purposely or unconsciously manipulated. Every effort has been made to obtain as much relevant data as possible for this evaluation. It is important to note that the conclusions that lead to this procedure are derived in large part from the available data. Always take into account that the treatment will also be dependent on availability of resources and existing treatment guidelines, considered by other Pain Management Practitioners as being common knowledge and practice, at the time of the intervention. For Medico-Legal purposes, it is also important to point out that variation in procedural techniques and pharmacological choices are the acceptable norm. The indications, contraindications, technique, and results of the above procedure should only be interpreted and judged by a Board-Certified Interventional Pain Specialist with extensive familiarity and expertise in the same exact procedure and technique.

## 2017-09-29 ENCOUNTER — Other Ambulatory Visit: Payer: Self-pay

## 2017-09-29 ENCOUNTER — Encounter: Payer: Self-pay | Admitting: Pain Medicine

## 2017-09-29 ENCOUNTER — Ambulatory Visit (HOSPITAL_BASED_OUTPATIENT_CLINIC_OR_DEPARTMENT_OTHER): Payer: Medicare (Managed Care) | Admitting: Pain Medicine

## 2017-09-29 ENCOUNTER — Ambulatory Visit
Admission: RE | Admit: 2017-09-29 | Discharge: 2017-09-29 | Disposition: A | Discharge status: Home / Self Care | Payer: Medicare (Managed Care) | Source: Ambulatory Visit | Attending: Pain Medicine | Admitting: Pain Medicine

## 2017-09-29 VITALS — BP 141/89 | HR 84 | Temp 98.0°F | Resp 16 | Ht 69.0 in | Wt 292.0 lb

## 2017-09-29 DIAGNOSIS — G8929 Other chronic pain: Secondary | ICD-10-CM | POA: Insufficient documentation

## 2017-09-29 DIAGNOSIS — M545 Low back pain: Secondary | ICD-10-CM

## 2017-09-29 DIAGNOSIS — M5416 Radiculopathy, lumbar region: Secondary | ICD-10-CM | POA: Diagnosis not present

## 2017-09-29 DIAGNOSIS — M47816 Spondylosis without myelopathy or radiculopathy, lumbar region: Secondary | ICD-10-CM

## 2017-09-29 DIAGNOSIS — M9983 Other biomechanical lesions of lumbar region: Secondary | ICD-10-CM | POA: Insufficient documentation

## 2017-09-29 DIAGNOSIS — M48061 Spinal stenosis, lumbar region without neurogenic claudication: Secondary | ICD-10-CM | POA: Diagnosis not present

## 2017-09-29 MED ORDER — IOPAMIDOL (ISOVUE-M 200) INJECTION 41%
INTRAMUSCULAR | Status: AC
  Filled 2017-09-29: qty 10

## 2017-09-29 MED ORDER — DEXAMETHASONE SODIUM PHOSPHATE 10 MG/ML IJ SOLN
10.0000 mg | Freq: Once | INTRAMUSCULAR | Status: AC
  Administered 2017-09-29: 10 mg
  Filled 2017-09-29: qty 1

## 2017-09-29 MED ORDER — FENTANYL CITRATE (PF) 100 MCG/2ML IJ SOLN: 25.0000 ug | INTRAMUSCULAR | Status: DC | PRN

## 2017-09-29 MED ORDER — ROPIVACAINE HCL 2 MG/ML IJ SOLN
1.0000 mL | Freq: Once | INTRAMUSCULAR | Status: AC
  Administered 2017-09-29: 1 mL via EPIDURAL
  Filled 2017-09-29: qty 10

## 2017-09-29 MED ORDER — LIDOCAINE HCL 2 % IJ SOLN
20.0000 mL | Freq: Once | INTRAMUSCULAR | Status: AC
  Administered 2017-09-29: 200 mg
  Filled 2017-09-29: qty 40

## 2017-09-29 MED ORDER — SODIUM CHLORIDE 0.9% FLUSH
1.0000 mL | Freq: Once | INTRAVENOUS | Status: AC
  Administered 2017-09-29: 1 mL

## 2017-09-29 MED ORDER — LACTATED RINGERS IV SOLN: 1000.0000 mL | Freq: Once | INTRAVENOUS | Status: DC

## 2017-09-29 MED ORDER — SODIUM CHLORIDE 0.9 % IJ SOLN
INTRAMUSCULAR | Status: AC
  Filled 2017-09-29: qty 10

## 2017-09-29 MED ORDER — MIDAZOLAM HCL 5 MG/5ML IJ SOLN: 1.0000 mg | INTRAMUSCULAR | Status: DC | PRN

## 2017-09-29 NOTE — Progress Notes (Signed)
Safety precautions to be maintained throughout the outpatient stay will include: orient to surroundings, keep bed in low position, maintain call bell within reach at all times, provide assistance with transfer out of bed and ambulation.  

## 2017-09-29 NOTE — Patient Instructions (Signed)

## 2017-09-30 ENCOUNTER — Telehealth: Payer: Self-pay

## 2017-09-30 NOTE — Telephone Encounter (Signed)
Denies any needs at this time. Instructed to call if needed. 

## 2017-10-17 ENCOUNTER — Ambulatory Visit: Payer: Medicare (Managed Care) | Admitting: Pain Medicine

## 2017-10-17 DIAGNOSIS — R937 Abnormal findings on diagnostic imaging of other parts of musculoskeletal system: Secondary | ICD-10-CM | POA: Insufficient documentation

## 2017-10-17 NOTE — Progress Notes (Unsigned)
Patient's Name: James Friedman  MRN: [id number]  Referring Provider: Gareth Morgan, MD  DOB: 04-02-1946  PCP: Gareth Morgan, MD  DOS: 10/17/2017  Note by: Gaspar Cola, MD  Service setting: Ambulatory outpatient  Specialty: Interventional Pain Management  Location: [facility] (AMB) Pain Management Facility    Patient type: Established   Primary Reason(s) for Visit: Encounter for post-procedure evaluation of chronic illness with mild to moderate exacerbation CC: No chief complaint on file.  HPI  James Friedman is a 72 y.o. year old, male patient, who comes today for a post-procedure evaluation. He has Hyperlipidemia; Chronic shoulder pain (Bilateral) (L>R); Non-traumatic rotator cuff tear; Chronic pain syndrome; Chronic low back pain (Primary Source of Pain) (Bilateral) (L>R); Lumbosacral radiculopathy at S1 (Right side); Lumbar facet syndrome (Bilateral) (L>R); Chronic neck pain (Bilateral) (R>L); Chronic cervical radicular pain (Bilateral) (R>L); Cervical spondylosis; Morbid obesity ([place]); Long term current use of opiate analgesic; Long term prescription opiate use; Opiate use (60 MME/Day); Opiate dependence ([place]); Chronic sacroiliac joint pain (Bilateral) (R>L); Failed neck surgery syndrome; Chronic upper extremity pain (Bilateral) (R>L); History of bilateral knee surgery; Diffuse myofascial pain syndrome; Lumbar facet hypertrophy (L4-5 and L5-S1); Carpal tunnel syndrome on right (Released); Transient weakness of left lower extremity; At high risk for falls; 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); Grade 1 Anterolisthesis of L4 over L5; Lumbar disc herniation (Left L4-5) (Right L5-S1); Chronic lower extremity pain (Secondary source of pain) (Bilateral) (R>L); Essential hypertension; Diabetes mellitus without complication ([place]); Atypical chest pain; Chronic knee pain (Bilateral); Diabetic peripheral neuropathy ([place]); Chronic lumbar radicular pain (Bilateral) (R>L) (S1 Dermatome on the  left); Abnormal EMG/PNCV (11/26/2015); Postoperative pain (post-radiofrequency); Lumbar spondylosis; COPD 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 ([place]); Polyneuropathy; Osteoarthritis of knee (Bilateral); Osteoarthritis of shoulder (Bilateral); Chronic shoulder Arthropathy (Bilateral); Cervical myofascial pain syndrome (Left trapezius); Primary osteoarthritis of left knee; Primary osteoarthritis of right knee; Suprapatellar bursitis of right knee; Chronic lumbar radiculopathy; Lumbar spinal stenosis (L3-4); Polypharmacy; and Abnormal MRI, lumbar spine (07/21/17) on their problem list. His primarily concern today is the No chief complaint on file.  Pain Assessment: Location:     Radiating:   Onset:   Duration:   Quality:   Severity:  /10 (self-reported pain score)  Note: Reported level is compatible with observation.                         When using our objective Pain Scale, levels between 6 and 10/10 are said to belong in an emergency room, as it progressively worsens from a 6/10, described as severely limiting, requiring emergency care not usually available at an outpatient pain management facility. At a 6/10 level, communication becomes difficult and requires great effort. Assistance to reach the emergency department may be required. Facial flushing and profuse sweating along with potentially dangerous increases in heart rate and blood pressure will be evident. Effect on ADL:   Timing:   Modifying factors:    [name] comes in today for post-procedure evaluation after the treatment done on 09/29/2017.  Further details on both, my assessment(s), as well as the proposed treatment plan, please see below.  Post-Procedure Assessment  09/29/2017 Procedure: Diagnostic/therapeutic right-sided L2-3 interlaminar lumbar epidural steroid injection under fluoroscopic guidance, no sedation. Pre-procedure pain score:  5/10 Post-procedure pain score: 0/10 (100% relief) Influential Factors: BMI:   Intra-procedural  challenges: None observed.         Assessment challenges: None detected.  Reported side-effects: None.        Post-procedural adverse reactions or complications: None reported         Sedation: No sedation used. When no sedatives are used, the analgesic levels obtained are directly associated to the effectiveness of the local anesthetics. However, when sedation is provided, the level of analgesia obtained during the initial 1 hour following the intervention, is believed to be the result of a combination of factors. These factors may include, but are not limited to: 1. The effectiveness of the local anesthetics used. 2. The effects of the analgesic(s) and/or anxiolytic(s) used. 3. The degree of discomfort experienced by the patient at the time of the procedure. 4. The patients ability and reliability in recalling and recording the events. 5. The presence and influence of possible secondary gains and/or psychosocial factors. Reported result: Relief experienced during the 1st hour after the procedure:   (Ultra-Short Term Relief)            Interpretative annotation: Clinically appropriate result. No IV Analgesic or Anxiolytic given, therefore benefits are completely due to Local Anesthetic effects.          Effects of local anesthetic: The analgesic effects attained during this period are directly associated to the localized infiltration of local anesthetics and therefore cary significant diagnostic value as to the etiological location, or anatomical origin, of the pain. Expected duration of relief is directly dependent on the pharmacodynamics of the local anesthetic used. Long-acting (4-6 hours) anesthetics used.  Reported result: Relief during the next 4 to 6 hour after the procedure:   (Short-Term Relief)            Interpretative annotation: Clinically appropriate result. Analgesia during this period is likely to be Local Anesthetic-related.          Long-term benefit: Defined as the  period of time past the expected duration of local anesthetics (1 hour for short-acting and 4-6 hours for long-acting). With the possible exception of prolonged sympathetic blockade from the local anesthetics, benefits during this period are typically attributed to, or associated with, other factors such as analgesic sensory neuropraxia, antiinflammatory effects, or beneficial biochemical changes provided by agents other than the local anesthetics.  Reported result: Extended relief following procedure:   (Long-Term Relief)            Interpretative annotation: Clinically appropriate result. Good relief. No permanent benefit expected. Inflammation plays a part in the etiology to the pain.          Current benefits: Defined as reported results that persistent at this point in time.   Analgesia: *** %            Function: Somewhat improved ROM: Somewhat improved Interpretative annotation: Recurrence of symptoms. No permanent benefit expected. Effective diagnostic intervention.          Interpretation: Results would suggest a successful diagnostic intervention.                  Plan:  Please see "Plan of Care" for details.        Laboratory Chemistry  Inflammation Markers (CRP: Acute Phase) (ESR: Chronic Phase) Lab Results  Component Value Date   CRP 1.3 03/28/2017   ESRSEDRATE 3 03/28/2017                 Renal Function Markers Lab Results  Component Value Date   BUN 19 03/28/2017   CREATININE 0.79 03/28/2017   GFRAA 107 03/28/2017   GFRNONAA 92 03/28/2017  Hepatic Function Markers Lab Results  Component Value Date   AST 19 03/28/2017   ALT 23 03/28/2017   ALBUMIN 4.3 03/28/2017   ALKPHOS 70 03/28/2017                 Electrolytes Lab Results  Component Value Date   NA 136 03/28/2017   K 5.1 03/28/2017   CL 91 (L) 03/28/2017   CALCIUM 9.1 03/28/2017   MG 1.8 03/28/2017                 Neuropathy Markers Lab Results  Component Value Date   VITAMINB12  255 03/28/2017                 Bone Pathology Markers Lab Results  Component Value Date   25OHVITD1 35 03/28/2017   25OHVITD2 14 03/28/2017   25OHVITD3 21 03/28/2017                 Note: Lab results reviewed.  Recent Diagnostic Imaging Results  DG C-Arm 1-60 Min-No Report Fluoroscopy was utilized by the requesting physician.  No radiographic  interpretation.   Complexity Note: Imaging results reviewed. Results shared with Mr. Dorris, using Layman's terms.                         Meds   Current Outpatient Medications:  .  acetaminophen (TYLENOL) 325 MG tablet, Take 650 mg by mouth as needed. , Disp: , Rfl:  .  albuterol (PROAIR HFA) 108 (90 BASE) MCG/ACT inhaler, Inhale 2 puffs into the lungs every 6 (six) hours as needed for wheezing., Disp: , Rfl:  .  aluminum hydroxide (AMPHOJEL/ALTERNAGEL) 320 MG/5ML suspension, Take 30 mLs by mouth every 6 (six) hours as needed for indigestion or heartburn., Disp: , Rfl:  .  amLODipine (NORVASC) 5 MG tablet, Take 5 mg by mouth daily. , Disp: , Rfl:  .  antiseptic oral rinse (BIOTENE) LIQD, 15 mLs by Mouth Rinse route as needed for dry mouth., Disp: , Rfl:  .  aspirin 81 MG chewable tablet, Chew by mouth., Disp: , Rfl:  .  atorvastatin (LIPITOR) 80 MG tablet, Take 80 mg by mouth daily., Disp: , Rfl:  .  azelastine (ASTELIN) 0.1 % nasal spray, Place 1 spray into both nostrils as needed. Use in each nostril as directed , Disp: , Rfl:  .  capsaicin (ZOSTRIX) 0.025 % cream, Apply 1 application topically 2 (two) times daily., Disp: , Rfl:  .  Carboxymethylcellul-Glycerin (OPTIVE) 0.5-0.9 % SOLN, Apply 1 drop to eye daily., Disp: , Rfl:  .  cetirizine (ZYRTEC) 10 MG tablet, Take 10 mg by mouth daily., Disp: , Rfl:  .  chlorpheniramine (CHLOR-TRIMETON) 4 MG tablet, Take 4 mg by mouth every 6 (six) hours as needed for allergies., Disp: , Rfl:  .  Clotrimazole 1 % OINT, Apply 1 application topically 2 (two) times daily. , Disp: , Rfl:  .  diclofenac  sodium (VOLTAREN) 1 % GEL, Apply topically 2 (two) times daily as needed., Disp: , Rfl:  .  docusate sodium (COLACE) 100 MG capsule, Take 100 mg by mouth daily as needed for mild constipation., Disp: , Rfl:  .  DULoxetine (CYMBALTA) 30 MG capsule, Take 30 mg by mouth 2 (two) times daily., Disp: , Rfl:  .  ergocalciferol (VITAMIN D2) 50000 units capsule, Take 50,000 Units by mouth every 30 (thirty) days., Disp: , Rfl:  .  fluticasone (FLONASE) 50 MCG/ACT nasal spray, , Disp: ,  Rfl:  .  gabapentin (NEURONTIN) 600 MG tablet, Take 600 mg by mouth 2 (two) times daily. One capsule in the am and two capsules at night, Disp: , Rfl:  .  glucagon (GLUCAGON EMERGENCY) 1 MG injection, Inject 1 mg into the vein once as needed., Disp: , Rfl:  .  hydrOXYzine (ATARAX/VISTARIL) 25 MG tablet, Take 25 mg by mouth every 6 (six) hours as needed for itching., Disp: , Rfl:  .  insulin aspart (NOVOLOG) 100 UNIT/ML injection, Inject 70 Units into the skin 4 (four) times daily -  before meals and at bedtime. , Disp: , Rfl:  .  insulin detemir (LEVEMIR) 100 UNIT/ML injection, Inject 80 Units into the skin 2 (two) times daily. , Disp: , Rfl:  .  lidocaine (LIDODERM) 5 %, Place 1 patch onto the skin daily. Remove & Discard patch within 12 hours or as directed by MD, Disp: , Rfl:  .  lisinopril (PRINIVIL,ZESTRIL) 10 MG tablet, Take 10 mg by mouth daily., Disp: , Rfl:  .  Magnesium 400 MG TABS, Take 1 tablet by mouth 2 (two) times daily. , Disp: , Rfl:  .  metFORMIN (GLUCOPHAGE) 1000 MG tablet, Take 1,000 mg by mouth 2 (two) times daily with a meal., Disp: , Rfl:  .  methocarbamol (ROBAXIN) 500 MG tablet, Take 500 mg by mouth 2 (two) times daily. , Disp: , Rfl:  .  metoprolol (LOPRESSOR) 100 MG tablet, Take 100 mg by mouth 2 (two) times daily., Disp: , Rfl:  .  mometasone (NASONEX) 50 MCG/ACT nasal spray, Place 2 sprays into the nose daily., Disp: , Rfl:  .  Multiple Vitamin (MULTIVITAMIN) tablet, Take 1 tablet by mouth daily.,  Disp: , Rfl:  .  naloxone (NARCAN) nasal spray 4 mg/0.1 mL, Place 1 spray into the nose., Disp: , Rfl:  .  naproxen (NAPROSYN) 500 MG tablet, Take 500 mg by mouth 2 (two) times daily with a meal., Disp: , Rfl:  .  Omega-3 Fatty Acids (FISH OIL) 1000 MG CAPS, Take by mouth 2 (two) times daily., Disp: , Rfl:  .  omeprazole (PRILOSEC) 20 MG capsule, Take 20 mg by mouth daily., Disp: , Rfl:  .  OXYGEN, Inhale 3 L into the lungs at bedtime., Disp: , Rfl:  .  pantoprazole (PROTONIX) 40 MG tablet, Take 40 mg by mouth., Disp: , Rfl:  .  polyethylene glycol powder (GLYCOLAX/MIRALAX) powder, Take by mouth as needed. , Disp: , Rfl:  .  promethazine (PHENERGAN) 12.5 MG tablet, Take by mouth., Disp: , Rfl:  .  senna (SENOKOT) 8.6 MG TABS tablet, Take 1 tablet by mouth daily as needed for mild constipation., Disp: , Rfl:  .  sitaGLIPtin-metformin (JANUMET) 50-1000 MG tablet, Take 1 tablet by mouth 2 (two) times daily with a meal., Disp: , Rfl:  .  testosterone cypionate (DEPOTESTOTERONE CYPIONATE) 200 MG/ML injection, Inject 200 mg into the muscle every 14 (fourteen) days., Disp: , Rfl:  .  traZODone (DESYREL) 150 MG tablet, 150 mg daily. , Disp: , Rfl:  .  vitamin C (ASCORBIC ACID) 500 MG tablet, Take 500 mg by mouth daily., Disp: , Rfl:  .  Vitamins A & D (VITAMIN A & D) ointment, Apply 1 application topically as needed for dry skin., Disp: , Rfl:   ROS  Constitutional: Denies any fever or chills Gastrointestinal: No reported hemesis, hematochezia, vomiting, or acute GI distress Musculoskeletal: Denies any acute onset joint swelling, redness, loss of ROM, or weakness Neurological: No reported  episodes of acute onset apraxia, aphasia, dysarthria, agnosia, amnesia, paralysis, loss of coordination, or loss of consciousness  Allergies  Mr. Sam has No Known Allergies.  PFSH  Drug: Mr. Fabiano  reports that he does not use drugs. Alcohol:  reports that he does not drink alcohol. Tobacco:  reports that  he has quit smoking. His smoking use included cigarettes. He has a 3.75 pack-year smoking history. His smokeless tobacco use includes chew. Medical:  has a past medical history of Anxiety, Asthma, Atypical chest pain, Back pain, BPH (benign prostatic hypertrophy), Carpal tunnel syndrome, Carpal tunnel syndrome on right (07/17/2015), Chest pain (07/26/2013), DDD (degenerative disc disease), Diabetes mellitus without complication ([place]), Essential hypertension, Falls, GERD (gastroesophageal reflux disease), Heme positive stool, History of bilateral knee surgery (07/17/2015), Hyperlipidemia, Hypertension, Morbid obesity ([place]), OA (osteoarthritis), OSA (obstructive sleep apnea), Ventral hernia, and Wears dentures. Surgical: Mr. Baiz  has a past surgical history that includes Knee surgery (Bilateral); Neck surgery; Carpal tunnel release; Shoulder surgery (Right); Cataract extraction, extracapsular (Left, 08/04/2015); and Cataract extraction w/PHACO (Right, 04/19/2016). Family: family history includes Heart attack in his father.  Constitutional Exam  General appearance: Well nourished, well developed, and well hydrated. In no apparent acute distress There were no vitals filed for this visit. BMI Assessment: Estimated body mass index is 43.12 kg/m as calculated from the following:   Height as of 09/29/17: 5' 9" (1.753 m).   Weight as of 09/29/17: 292 lb (132.5 kg).  BMI interpretation table: BMI level Category Range association with higher incidence of chronic pain  <18 kg/m2 Underweight   18.5-24.9 kg/m2 Ideal body weight   25-29.9 kg/m2 Overweight Increased incidence by 20%  30-34.9 kg/m2 Obese (Class I) Increased incidence by 68%  35-39.9 kg/m2 Severe obesity (Class II) Increased incidence by 136%  >40 kg/m2 Extreme obesity (Class III) Increased incidence by 254%   BMI Readings from Last 4 Encounters:  09/29/17 43.12 kg/m  09/02/17 43.12 kg/m  06/27/17 41.64 kg/m  06/07/17 41.64 kg/m   Wt  Readings from Last 4 Encounters:  09/29/17 292 lb (132.5 kg)  09/02/17 292 lb (132.5 kg)  06/27/17 282 lb (127.9 kg)  06/07/17 282 lb (127.9 kg)  Psych/Mental status: Alert, oriented x 3 (person, place, & time)       Eyes: PERLA Respiratory: No evidence of acute respiratory distress  Cervical Spine Area Exam  Skin & Axial Inspection: No masses, redness, edema, swelling, or associated skin lesions Alignment: Symmetrical Functional ROM: Unrestricted ROM      Stability: No instability detected Muscle Tone/Strength: Functionally intact. No obvious neuro-muscular anomalies detected. Sensory (Neurological): Unimpaired Palpation: No palpable anomalies              Upper Extremity (UE) Exam    Side: Right upper extremity  Side: Left upper extremity  Skin & Extremity Inspection: Skin color, temperature, and hair growth are WNL. No peripheral edema or cyanosis. No masses, redness, swelling, asymmetry, or associated skin lesions. No contractures.  Skin & Extremity Inspection: Skin color, temperature, and hair growth are WNL. No peripheral edema or cyanosis. No masses, redness, swelling, asymmetry, or associated skin lesions. No contractures.  Functional ROM: Unrestricted ROM          Functional ROM: Unrestricted ROM          Muscle Tone/Strength: Functionally intact. No obvious neuro-muscular anomalies detected.  Muscle Tone/Strength: Functionally intact. No obvious neuro-muscular anomalies detected.  Sensory (Neurological): Unimpaired          Sensory (Neurological): Unimpaired  Palpation: No palpable anomalies              Palpation: No palpable anomalies              Specialized Test(s): Deferred         Specialized Test(s): Deferred          Thoracic Spine Area Exam  Skin & Axial Inspection: No masses, redness, or swelling Alignment: Symmetrical Functional ROM: Unrestricted ROM Stability: No instability detected Muscle Tone/Strength: Functionally intact. No obvious neuro-muscular  anomalies detected. Sensory (Neurological): Unimpaired Muscle strength & Tone: No palpable anomalies  Lumbar Spine Area Exam  Skin & Axial Inspection: No masses, redness, or swelling Alignment: Symmetrical Functional ROM: Unrestricted ROM      Stability: No instability detected Muscle Tone/Strength: Functionally intact. No obvious neuro-muscular anomalies detected. Sensory (Neurological): Unimpaired Palpation: No palpable anomalies       Provocative Tests: Lumbar Hyperextension and rotation test: evaluation deferred today       Lumbar Lateral bending test: evaluation deferred today       Patrick's Maneuver: evaluation deferred today                    Gait & Posture Assessment  Ambulation: Patient ambulates using a wheel chair Gait: Very limited, using assistive device to ambulate Posture: WNL   Lower Extremity Exam    Side: Right lower extremity  Side: Left lower extremity  Skin & Extremity Inspection: Skin color, temperature, and hair growth are WNL. No peripheral edema or cyanosis. No masses, redness, swelling, asymmetry, or associated skin lesions. No contractures.  Skin & Extremity Inspection: Skin color, temperature, and hair growth are WNL. No peripheral edema or cyanosis. No masses, redness, swelling, asymmetry, or associated skin lesions. No contractures.  Functional ROM: Unrestricted ROM          Functional ROM: Unrestricted ROM          Muscle Tone/Strength: Functionally intact. No obvious neuro-muscular anomalies detected.  Muscle Tone/Strength: Functionally intact. No obvious neuro-muscular anomalies detected.  Sensory (Neurological): Unimpaired  Sensory (Neurological): Unimpaired  Palpation: No palpable anomalies  Palpation: No palpable anomalies   Assessment  Primary Diagnosis & Pertinent Problem List: The primary encounter diagnosis was Chronic pain syndrome. 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  Status Diagnosis  Controlled Controlled Controlled 1. Chronic pain syndrome   2. Chronic low back pain (Primary Source of Pain) (Bilateral) (L>R)   3. Lumbar spinal stenosis (L3-4)   4. 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 Lumbosacral radiculopathy at S1 (Right side)   6. Morbid obesity ([place])   7. Abnormal MRI, lumbar spine (07/21/17)   8. Abnormal EMG/PNCV (11/26/2015)     Problems updated and reviewed during this visit: Problem  Abnormal MRI, lumbar spine (07/21/17)   IMPRESSION: (Study compared to 2016.) 1. New L2-3 superiorly migrating disc extrusion causing moderate spinal stenosis. 2. L3-4 advanced disc and facet degeneration with moderate spinal stenosis. Moderate bilateral foraminal narrowing. 3. L4-5 advanced facet arthropathy with anterolisthesis. Left subarticular recess stenosis with left L5 impingement. Chronic left extraforaminal protrusion with L4 impingement. 4. L5-S1 chronic right paracentral protrusion with right S1 contact but no mass-effect.    Plan of Care  Pharmacotherapy (Medications Ordered): No  orders of the defined types were placed in this encounter.  Medications administered today: Mitzie Na. [facility] had no medications administered during this visit.  Procedure Orders    No procedure(s) ordered today   Lab Orders  No laboratory test(s) ordered today   Imaging Orders  No imaging studies ordered today   Referral Orders  No referral(s) requested today    Interventional management options: Planned, scheduled, and/or pending:   Neurosurgery referral    Considering:   Diagnostic bilateral suprascapular nerve block Diagnosticbilateral series of 5 intra-articular Hyalgan knee  injection Palliative bilateral lumbar facet radiofrequencyablation. (Right done 07/08/2016; 10/02/15 before that. Left done 12/09/2015)  Right intra-articular shoulder injection Possible intra-articular right knee injection Palliative right cervical epidural steroidinjection  Palliative bilateral genicular nerve RFA.    Palliative PRN treatment(s):   Palliative bilateral lumbar facet block Palliative bilateral intra-articular Hyalgan knee injection   Provider-requested follow-up: No Follow-up on file.  Future Appointments  Date Time Provider [place]  10/17/2017  9:15 AM Milinda Pointer, MD [facility] None  11/29/2017  9:30 AM Vevelyn Francois, NP [facility] None   Primary Care Physician: Gareth Morgan, MD Location: [facility] Facility Note by: Gaspar Cola, MD Date: 10/17/2017; Time: 7:16 AM

## 2017-11-16 ENCOUNTER — Encounter: Payer: Self-pay | Admitting: Pain Medicine

## 2017-11-16 ENCOUNTER — Ambulatory Visit: Payer: Medicare (Managed Care) | Attending: Pain Medicine | Admitting: Pain Medicine

## 2017-11-16 ENCOUNTER — Other Ambulatory Visit: Payer: Self-pay

## 2017-11-16 VITALS — BP 108/79 | HR 81 | Temp 98.2°F | Resp 18 | Ht 69.0 in | Wt 292.0 lb

## 2017-11-16 DIAGNOSIS — M542 Cervicalgia: Secondary | ICD-10-CM

## 2017-11-16 DIAGNOSIS — M5030 Other cervical disc degeneration, unspecified cervical region: Secondary | ICD-10-CM

## 2017-11-16 DIAGNOSIS — R0789 Other chest pain: Secondary | ICD-10-CM | POA: Diagnosis not present

## 2017-11-16 DIAGNOSIS — J45909 Unspecified asthma, uncomplicated: Secondary | ICD-10-CM | POA: Diagnosis not present

## 2017-11-16 DIAGNOSIS — K219 Gastro-esophageal reflux disease without esophagitis: Secondary | ICD-10-CM | POA: Diagnosis not present

## 2017-11-16 DIAGNOSIS — Z87891 Personal history of nicotine dependence: Secondary | ICD-10-CM | POA: Insufficient documentation

## 2017-11-16 DIAGNOSIS — M79604 Pain in right leg: Secondary | ICD-10-CM

## 2017-11-16 DIAGNOSIS — M48061 Spinal stenosis, lumbar region without neurogenic claudication: Secondary | ICD-10-CM | POA: Diagnosis not present

## 2017-11-16 DIAGNOSIS — G8929 Other chronic pain: Secondary | ICD-10-CM

## 2017-11-16 DIAGNOSIS — M5010 Cervical disc disorder with radiculopathy, unspecified cervical region: Secondary | ICD-10-CM | POA: Insufficient documentation

## 2017-11-16 DIAGNOSIS — M549 Dorsalgia, unspecified: Secondary | ICD-10-CM | POA: Diagnosis present

## 2017-11-16 DIAGNOSIS — Z79899 Other long term (current) drug therapy: Secondary | ICD-10-CM | POA: Insufficient documentation

## 2017-11-16 DIAGNOSIS — M5116 Intervertebral disc disorders with radiculopathy, lumbar region: Secondary | ICD-10-CM | POA: Insufficient documentation

## 2017-11-16 DIAGNOSIS — E1140 Type 2 diabetes mellitus with diabetic neuropathy, unspecified: Secondary | ICD-10-CM | POA: Diagnosis not present

## 2017-11-16 DIAGNOSIS — G5601 Carpal tunnel syndrome, right upper limb: Secondary | ICD-10-CM | POA: Diagnosis not present

## 2017-11-16 DIAGNOSIS — M7918 Myalgia, other site: Secondary | ICD-10-CM | POA: Insufficient documentation

## 2017-11-16 DIAGNOSIS — Z794 Long term (current) use of insulin: Secondary | ICD-10-CM | POA: Diagnosis not present

## 2017-11-16 DIAGNOSIS — N400 Enlarged prostate without lower urinary tract symptoms: Secondary | ICD-10-CM | POA: Insufficient documentation

## 2017-11-16 DIAGNOSIS — I10 Essential (primary) hypertension: Secondary | ICD-10-CM | POA: Diagnosis not present

## 2017-11-16 DIAGNOSIS — F419 Anxiety disorder, unspecified: Secondary | ICD-10-CM | POA: Diagnosis not present

## 2017-11-16 DIAGNOSIS — E785 Hyperlipidemia, unspecified: Secondary | ICD-10-CM | POA: Insufficient documentation

## 2017-11-16 DIAGNOSIS — M5136 Other intervertebral disc degeneration, lumbar region: Secondary | ICD-10-CM

## 2017-11-16 DIAGNOSIS — M5412 Radiculopathy, cervical region: Secondary | ICD-10-CM

## 2017-11-16 DIAGNOSIS — G4733 Obstructive sleep apnea (adult) (pediatric): Secondary | ICD-10-CM | POA: Diagnosis not present

## 2017-11-16 DIAGNOSIS — M545 Low back pain: Secondary | ICD-10-CM

## 2017-11-16 DIAGNOSIS — G894 Chronic pain syndrome: Secondary | ICD-10-CM | POA: Diagnosis not present

## 2017-11-16 DIAGNOSIS — M51369 Other intervertebral disc degeneration, lumbar region without mention of lumbar back pain or lower extremity pain: Secondary | ICD-10-CM

## 2017-11-16 DIAGNOSIS — M79605 Pain in left leg: Secondary | ICD-10-CM

## 2017-11-16 NOTE — Progress Notes (Signed)
Patient's Name: James Friedman  MRN: [id number]  Referring Provider: Gareth Morgan, MD  DOB: May 14, 1946  PCP: Gareth Morgan, MD  DOS: 11/16/2017  Note by: Gaspar Cola, MD  Service setting: Ambulatory outpatient  Specialty: Interventional Pain Management  Location: [facility] (AMB) Pain Management Facility    Patient type: Established   Primary Reason(s) for Visit: Encounter for post-procedure evaluation of chronic illness with mild to moderate exacerbation CC: Back Pain and Neck Pain  HPI  James Friedman is a 72 y.o. year old, male patient, who comes today for a post-procedure evaluation. He has Hyperlipidemia; Chronic shoulder pain (Bilateral) (L>R); Non-traumatic rotator cuff tear; Chronic pain syndrome; Chronic low back pain (Primary Source of Pain) (Bilateral) (L>R); Lumbosacral radiculopathy at S1 (Right side); Lumbar facet syndrome (Bilateral) (L>R); Chronic neck pain (Bilateral) (R>L); Chronic cervical radicular pain (Bilateral) (R>L); Cervical spondylosis; Morbid obesity ([place]); Long term current use of opiate analgesic; Long term prescription opiate use; Opiate use (60 MME/Day); Opiate dependence ([place]); Chronic sacroiliac joint pain (Bilateral) (R>L); Failed neck surgery syndrome; Chronic upper extremity pain (Bilateral) (R>L); History of bilateral knee surgery; Diffuse myofascial pain syndrome; Lumbar facet hypertrophy (L4-5 and L5-S1); Carpal tunnel syndrome on right (Released); Transient weakness of left lower extremity; At high risk for falls; 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); Grade 1 Anterolisthesis of L4 over L5; Lumbar disc herniation (Left L4-5) (Right L5-S1); Chronic lower extremity pain (Secondary source of pain) (Bilateral) (R>L); Essential hypertension; Diabetes mellitus without complication ([place]); Atypical chest pain; Chronic knee pain (Bilateral); Diabetic peripheral neuropathy ([place]); Chronic lumbar radicular pain (Bilateral) (R>L) (S1 Dermatome on the  left); Abnormal EMG/PNCV (11/26/2015); Postoperative pain (post-radiofrequency); Lumbar spondylosis; COPD 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 ([place]); Polyneuropathy; Osteoarthritis of knee (Bilateral); Osteoarthritis of shoulder (Bilateral); Chronic shoulder Arthropathy (Bilateral); Cervical myofascial pain syndrome (Left trapezius); Primary osteoarthritis of left knee; Primary osteoarthritis of right knee; Suprapatellar bursitis of right knee; Chronic lumbar radiculopathy; Lumbar spinal stenosis (L3-4); Polypharmacy; Abnormal MRI, lumbar spine (07/21/17); DDD (degenerative disc disease), lumbar; DDD (degenerative disc disease), cervical; and Cervicalgia on their problem list. His primarily concern today is the Back Pain and Neck Pain  Pain Assessment: Location: Lower Back(neck) Radiating: radiates down right leg in the front Onset: More than a month ago Duration: Chronic pain Quality: Aching, Sore Severity: 2 /10 (self-reported pain score)  Note: Reported level is compatible with observation.                         When using our objective Pain Scale, levels between 6 and 10/10 are said to belong in an emergency room, as it progressively worsens from a 6/10, described as severely limiting, requiring emergency care not usually available at an outpatient pain management facility. At a 6/10 level, communication becomes difficult and requires great effort. Assistance to reach the emergency department may be required. Facial flushing and profuse sweating along with potentially dangerous increases in heart rate and blood pressure will be evident. Timing: Constant Modifying factors: procedure  James Friedman comes in today for post-procedure evaluation after the treatment done on 09/29/2017.  The patient returns to the clinic today after having had a right-sided L2-3 interlaminar lumbar epidural steroid injection under fluoroscopic guidance, no sedation.  Indicates that this worked a lot better than the right-sided L2-3  transforaminal.  At this point he is not complaining of any symptoms in the lower back or lower extremities and in fact he was able to completely stop his morphine.  He indicates that he is beginning to experience some pain in the cervical region and he will like to have a cervical epidural steroid injection done.  In reviewing his last epidural, we have that it was done in December 2016.  He has had benefit from that procedure until now.  In view of this, we will go ahead and repeated.  Further details on both, my assessment(s), as well as the proposed treatment plan, please see below.  Post-Procedure Assessment  09/29/2017 Procedure: Diagnostic/therapeutic right-sided L2-3 interlaminar lumbar epidural steroid injection #1 under fluoroscopic guidance, no sedation.  (Previously the patient had a right-sided L2-3 TFESI #1) Pre-procedure pain score:  5/10 Post-procedure pain score: 0/10 (100% relief) Influential Factors: BMI: 43.12 kg/m Intra-procedural challenges: None observed.         Assessment challenges: None detected.              Reported side-effects: None.        Post-procedural adverse reactions or complications: None reported         Sedation: No sedation used. When no sedatives are used, the analgesic levels obtained are directly associated to the effectiveness of the local anesthetics. However, when sedation is provided, the level of analgesia obtained during the initial 1 hour following the intervention, is believed to be the result of a combination of factors. These factors may include, but are not limited to: 1. The effectiveness of the local anesthetics used. 2. The effects of the analgesic(s) and/or anxiolytic(s) used. 3. The degree of discomfort experienced by the patient at the time of the procedure. 4. The patients ability and reliability in recalling and recording the events. 5. The presence and influence of possible secondary gains and/or psychosocial factors. Reported  result: Relief experienced during the 1st hour after the procedure: 100 % (Ultra-Short Term Relief) James Friedman has indicated area to have been numb during this time. Interpretative annotation: Clinically appropriate result. No IV Analgesic or Anxiolytic given, therefore benefits are completely due to Local Anesthetic effects.          Effects of local anesthetic: The analgesic effects attained during this period are directly associated to the localized infiltration of local anesthetics and therefore cary significant diagnostic value as to the etiological location, or anatomical origin, of the pain. Expected duration of relief is directly dependent on the pharmacodynamics of the local anesthetic used. Long-acting (4-6 hours) anesthetics used.  Reported result: Relief during the next 4 to 6 hour after the procedure: 100 % (Short-Term Relief) James Friedman has indicated area to have been numb during this time. Interpretative annotation: Clinically appropriate result. Analgesia during this period is likely to be Local Anesthetic-related.          Long-term benefit: Defined as the period of time past the expected duration of local anesthetics (1 hour for short-acting and 4-6 hours for long-acting). With the possible exception of prolonged sympathetic blockade from the local anesthetics, benefits during this period are typically attributed to, or associated with, other factors such as analgesic sensory neuropraxia, antiinflammatory effects, or beneficial biochemical changes provided by agents other than the local anesthetics.  Reported result: Extended relief following procedure: 75 % (Long-Term Relief) James Friedman reports that both, extremity and the axial pain improved with the treatment. Interpretative annotation: Clinically appropriate result. Good relief. No permanent benefit expected. Inflammation plays a part in the etiology to the pain. Benefit believed to be steroid-related.  Current benefits: Defined  as reported results that persistent at this point in time.  Analgesia: 75 % James Friedman reports that both, extremity and the axial pain improved with the treatment. Function: James Friedman reports improvement in function ROM: James Friedman reports improvement in ROM Interpretative annotation: Ongoing benefit. Therapeutic success. Effective therapeutic approach.          Interpretation: Results would suggest a successful diagnostic intervention.                  Plan:  Set up procedure as a PRN palliative treatment option for this patient.                Laboratory Chemistry  Inflammation Markers (CRP: Acute Phase) (ESR: Chronic Phase) Lab Results  Component Value Date   CRP 1.3 03/28/2017   ESRSEDRATE 3 03/28/2017                         Renal Function Markers Lab Results  Component Value Date   BUN 19 03/28/2017   CREATININE 0.79 03/28/2017   GFRAA 107 03/28/2017   GFRNONAA 92 03/28/2017                 Hepatic Function Markers Lab Results  Component Value Date   AST 19 03/28/2017   ALT 23 03/28/2017   ALBUMIN 4.3 03/28/2017   ALKPHOS 70 03/28/2017                 Electrolytes Lab Results  Component Value Date   NA 136 03/28/2017   K 5.1 03/28/2017   CL 91 (L) 03/28/2017   CALCIUM 9.1 03/28/2017   MG 1.8 03/28/2017                        Neuropathy Markers Lab Results  Component Value Date   VITAMINB12 255 03/28/2017                 Bone Pathology Markers Lab Results  Component Value Date   25OHVITD1 35 03/28/2017   25OHVITD2 14 03/28/2017   25OHVITD3 21 03/28/2017                         Note: Lab results reviewed.  Recent Diagnostic Imaging Results  DG C-Arm 1-60 Min-No Report Fluoroscopy was utilized by the requesting physician.  No radiographic  interpretation.   Complexity Note: I personally reviewed the fluoroscopic imaging of the procedure.                        Meds   Current Outpatient Medications:  .  acetaminophen (TYLENOL) 325 MG  tablet, Take 650 mg by mouth as needed. , Disp: , Rfl:  .  albuterol (PROAIR HFA) 108 (90 BASE) MCG/ACT inhaler, Inhale 2 puffs into the lungs every 6 (six) hours as needed for wheezing., Disp: , Rfl:  .  aluminum hydroxide (AMPHOJEL/ALTERNAGEL) 320 MG/5ML suspension, Take 30 mLs by mouth every 6 (six) hours as needed for indigestion or heartburn., Disp: , Rfl:  .  amLODipine (NORVASC) 5 MG tablet, Take 5 mg by mouth daily. , Disp: , Rfl:  .  antiseptic oral rinse (BIOTENE) LIQD, 15 mLs by Mouth Rinse route as needed for dry mouth., Disp: , Rfl:  .  atorvastatin (LIPITOR) 80 MG tablet, Take 80 mg by mouth daily., Disp: , Rfl:  .  capsaicin (ZOSTRIX) 0.025 % cream, Apply 1 application topically 2 (two) times daily., Disp: , Rfl:  .  cetirizine (ZYRTEC) 10 MG tablet, Take 10 mg by mouth daily., Disp: , Rfl:  .  chlorpheniramine (CHLOR-TRIMETON) 4 MG tablet, Take 4 mg by mouth every 6 (six) hours as needed for allergies., Disp: , Rfl:  .  Clotrimazole 1 % OINT, Apply 1 application topically 2 (two) times daily. , Disp: , Rfl:  .  diclofenac sodium (VOLTAREN) 1 % GEL, Apply topically 2 (two) times daily as needed., Disp: , Rfl:  .  docusate sodium (COLACE) 100 MG capsule, Take 100 mg by mouth daily as needed for mild constipation., Disp: , Rfl:  .  DULoxetine (CYMBALTA) 30 MG capsule, Take 30 mg by mouth 2 (two) times daily., Disp: , Rfl:  .  fluticasone (FLONASE) 50 MCG/ACT nasal spray, , Disp: , Rfl:  .  gabapentin (NEURONTIN) 600 MG tablet, Take 600 mg by mouth 2 (two) times daily. One capsule in the am and two capsules at night, Disp: , Rfl:  .  glucagon (GLUCAGON EMERGENCY) 1 MG injection, Inject 1 mg into the vein once as needed., Disp: , Rfl:  .  hydrOXYzine (ATARAX/VISTARIL) 25 MG tablet, Take 25 mg by mouth every 6 (six) hours as needed for itching., Disp: , Rfl:  .  insulin aspart (NOVOLOG) 100 UNIT/ML injection, Inject 70 Units into the skin 3 (three) times daily with meals. , Disp: , Rfl:   .  insulin detemir (LEVEMIR) 100 UNIT/ML injection, Inject 80 Units into the skin 2 (two) times daily. , Disp: , Rfl:  .  lisinopril (PRINIVIL,ZESTRIL) 10 MG tablet, Take 10 mg by mouth daily., Disp: , Rfl:  .  Magnesium 400 MG TABS, Take 1 tablet by mouth 2 (two) times daily. , Disp: , Rfl:  .  methocarbamol (ROBAXIN) 500 MG tablet, Take 500 mg by mouth 2 (two) times daily. , Disp: , Rfl:  .  metoprolol (LOPRESSOR) 100 MG tablet, Take 100 mg by mouth 2 (two) times daily., Disp: , Rfl:  .  mometasone (NASONEX) 50 MCG/ACT nasal spray, Place 2 sprays into the nose daily., Disp: , Rfl:  .  Multiple Vitamin (MULTIVITAMIN) tablet, Take 1 tablet by mouth daily., Disp: , Rfl:  .  naloxone (NARCAN) nasal spray 4 mg/0.1 mL, Place 1 spray into the nose., Disp: , Rfl:  .  naproxen (NAPROSYN) 500 MG tablet, Take 500 mg by mouth 2 (two) times daily with a meal., Disp: , Rfl:  .  omeprazole (PRILOSEC) 20 MG capsule, Take 20 mg by mouth daily., Disp: , Rfl:  .  oxycodone (OXY-IR) 5 MG capsule, Take 5 mg by mouth 2 (two) times daily as needed., Disp: , Rfl:  .  OXYGEN, Inhale 3 L into the lungs at bedtime., Disp: , Rfl:  .  pantoprazole (PROTONIX) 40 MG tablet, Take 40 mg by mouth., Disp: , Rfl:  .  polyethylene glycol powder (GLYCOLAX/MIRALAX) powder, Take by mouth as needed. , Disp: , Rfl:  .  promethazine (PHENERGAN) 12.5 MG tablet, Take by mouth., Disp: , Rfl:  .  sitaGLIPtin-metformin (JANUMET) 50-1000 MG tablet, Take 1 tablet by mouth 2 (two) times daily with a meal., Disp: , Rfl:  .  testosterone cypionate (DEPOTESTOTERONE CYPIONATE) 200 MG/ML injection, Inject 200 mg into the muscle every 14 (fourteen) days., Disp: , Rfl:  .  traZODone (DESYREL) 150 MG tablet, 150 mg daily. , Disp: , Rfl:  .  vitamin C (ASCORBIC ACID) 500 MG tablet, Take 500 mg by mouth daily., Disp: , Rfl:  .  Vitamins A & D (  VITAMIN A & D) ointment, Apply 1 application topically as needed for dry skin., Disp: , Rfl:   ROS   Constitutional: Denies any fever or chills Gastrointestinal: No reported hemesis, hematochezia, vomiting, or acute GI distress Musculoskeletal: Denies any acute onset joint swelling, redness, loss of ROM, or weakness Neurological: No reported episodes of acute onset apraxia, aphasia, dysarthria, agnosia, amnesia, paralysis, loss of coordination, or loss of consciousness  Allergies  James Friedman has No Known Allergies.  PFSH  Drug: James Friedman  reports that he does not use drugs. Alcohol:  reports that he does not drink alcohol. Tobacco:  reports that he has quit smoking. His smoking use included cigarettes. He has a 3.75 pack-year smoking history. His smokeless tobacco use includes chew. Medical:  has a past medical history of Anxiety, Asthma, Atypical chest pain, Back pain, BPH (benign prostatic hypertrophy), Carpal tunnel syndrome, Carpal tunnel syndrome on right (07/17/2015), Chest pain (07/26/2013), DDD (degenerative disc disease), Diabetes mellitus without complication ([place]), Essential hypertension, Falls, GERD (gastroesophageal reflux disease), Heme positive stool, History of bilateral knee surgery (07/17/2015), Hyperlipidemia, Hypertension, Morbid obesity ([place]), OA (osteoarthritis), OSA (obstructive sleep apnea), Ventral hernia, and Wears dentures. Surgical: James Friedman  has a past surgical history that includes Knee surgery (Bilateral); Neck surgery; Carpal tunnel release; Shoulder surgery (Right); Cataract extraction, extracapsular (Left, 08/04/2015); and Cataract extraction w/PHACO (Right, 04/19/2016). Family: family history includes Heart attack in his father.  Constitutional Exam  General appearance: Well nourished, well developed, and well hydrated. In no apparent acute distress Vitals:   11/16/17 1350  BP: 108/79  Pulse: 81  Resp: 18  Temp: 98.2 F (36.8 C)  SpO2: 98%  Weight: 292 lb (132.5 kg)  Height: _0  (1.753 m)   BMI Assessment: Estimated body mass index is 43.12  kg/m as calculated from the following:   Height as of this encounter: _1  (1.753 m).   Weight as of this encounter: 292 lb (132.5 kg).  BMI interpretation table: BMI level Category Range association with higher incidence of chronic pain  <18 kg/m2 Underweight   18.5-24.9 kg/m2 Ideal body weight   25-29.9 kg/m2 Overweight Increased incidence by 20%  30-34.9 kg/m2 Obese (Class I) Increased incidence by 68%  35-39.9 kg/m2 Severe obesity (Class II) Increased incidence by 136%  >40 kg/m2 Extreme obesity (Class III) Increased incidence by 254%   BMI Readings from Last 4 Encounters:  11/16/17 43.12 kg/m  09/29/17 43.12 kg/m  09/02/17 43.12 kg/m  06/27/17 41.64 kg/m   Wt Readings from Last 4 Encounters:  11/16/17 292 lb (132.5 kg)  09/29/17 292 lb (132.5 kg)  09/02/17 292 lb (132.5 kg)  06/27/17 282 lb (127.9 kg)  Psych/Mental status: Alert, oriented x 3 (person, place, & time)       Eyes: PERLA Respiratory: No evidence of acute respiratory distress  Cervical Spine Area Exam  Skin & Axial Inspection: No masses, redness, edema, swelling, or associated skin lesions Alignment: Symmetrical Functional ROM: Diminished ROM      Stability: No instability detected Muscle Tone/Strength: Functionally intact. No obvious neuro-muscular anomalies detected. Sensory (Neurological): Movement-associated discomfort Palpation: No palpable anomalies              Upper Extremity (UE) Exam    Side: Right upper extremity  Side: Left upper extremity  Skin & Extremity Inspection: Skin color, temperature, and hair growth are WNL. No peripheral edema or cyanosis. No masses, redness, swelling, asymmetry, or associated skin lesions. No contractures.  Skin & Extremity Inspection: Skin color,  temperature, and hair growth are WNL. No peripheral edema or cyanosis. No masses, redness, swelling, asymmetry, or associated skin lesions. No contractures.  Functional ROM: Unrestricted ROM          Functional ROM:  Unrestricted ROM          Muscle Tone/Strength: Functionally intact. No obvious neuro-muscular anomalies detected.  Muscle Tone/Strength: Functionally intact. No obvious neuro-muscular anomalies detected.  Sensory (Neurological): Dermatomal pain pattern          Sensory (Neurological): Unimpaired          Palpation: No palpable anomalies              Palpation: No palpable anomalies              Specialized Test(s): Deferred         Specialized Test(s): Deferred          Thoracic Spine Area Exam  Skin & Axial Inspection: No masses, redness, or swelling Alignment: Symmetrical Functional ROM: Unrestricted ROM Stability: No instability detected Muscle Tone/Strength: Functionally intact. No obvious neuro-muscular anomalies detected. Sensory (Neurological): Unimpaired Muscle strength & Tone: No palpable anomalies  Lumbar Spine Area Exam  Skin & Axial Inspection: No masses, redness, or swelling Alignment: Symmetrical Functional ROM: Improved after treatment      Stability: No instability detected Muscle Tone/Strength: Functionally intact. No obvious neuro-muscular anomalies detected. Sensory (Neurological): Unimpaired Palpation: No palpable anomalies       Provocative Tests: Lumbar Hyperextension and rotation test: evaluation deferred today       Lumbar Lateral bending test: evaluation deferred today       Patrick's Maneuver: evaluation deferred today                    Gait & Posture Assessment  Ambulation: Patient came in today in a wheel chair Gait: Modified gait pattern (slower gait speed, wider stride width, and longer stance duration) associated with morbid obesity Posture: Antalgic   Lower Extremity Exam    Side: Right lower extremity  Side: Left lower extremity  Skin & Extremity Inspection: Skin color, temperature, and hair growth are WNL. No peripheral edema or cyanosis. No masses, redness, swelling, asymmetry, or associated skin lesions. No contractures.  Skin & Extremity  Inspection: Skin color, temperature, and hair growth are WNL. No peripheral edema or cyanosis. No masses, redness, swelling, asymmetry, or associated skin lesions. No contractures.  Functional ROM: Unrestricted ROM          Functional ROM: Unrestricted ROM          Muscle Tone/Strength: Functionally intact. No obvious neuro-muscular anomalies detected.  Muscle Tone/Strength: Functionally intact. No obvious neuro-muscular anomalies detected.  Sensory (Neurological): Unimpaired  Sensory (Neurological): Unimpaired  Palpation: No palpable anomalies  Palpation: No palpable anomalies   Assessment  Primary Diagnosis & Pertinent Problem List: The primary encounter diagnosis was Cervicalgia. Diagnoses of Chronic lower extremity pain (Secondary source of pain) (Bilateral) (R>L), Chronic low back pain (Primary Source of Pain) (Bilateral) (L>R), DDD (degenerative disc disease), lumbar, DDD (degenerative disc disease), cervical, and Chronic cervical radicular pain (Bilateral) (R>L) were also pertinent to this visit.  Status Diagnosis  Recurring Resolved Resolved 1. Cervicalgia   2. Chronic lower extremity pain (Secondary source of pain) (Bilateral) (R>L)   3. Chronic low back pain (Primary Source of Pain) (Bilateral) (L>R)   4. DDD (degenerative disc disease), lumbar   5. DDD (degenerative disc disease), cervical   6. Chronic cervical radicular pain (Bilateral) (R>L)  Problems updated and reviewed during this visit: Problem  Ddd (Degenerative Disc Disease), Lumbar  Ddd (Degenerative Disc Disease), Cervical  Cervicalgia  Abnormal EMG/PNCV (11/26/2015)   Abnormal study. There is electrodiagnostic evidence of severe sensorimotor polyneuropathy in the lower extremities. (Testing done at [facility] neurology back Dr. Gurney Maxin)    Plan of Care  Pharmacotherapy (Medications Ordered): No orders of the defined types were placed in this encounter.  Medications administered today: Mitzie Na. Fultz had no medications administered during this visit.   Procedure Orders     Cervical Epidural Injection     Cervical Epidural Injection     Lumbar Epidural Injection Lab Orders  No laboratory test(s) ordered today   Imaging Orders  No imaging studies ordered today   Referral Orders  No referral(s) requested today    Interventional management options: Planned, scheduled, and/or pending:   Therapeutic right sided cervical epidural steroid injection under fluoroscopic guidance, no sedation   Considering:   Palliative bilateral intra-articular Hyalgan knee injection Palliative right sided cervical epidural steroid injection  Palliative right-sided L2-3 interlaminar LESI  Diagnostic bilateral suprascapular nerve block Diagnosticbilateral Hyalgan knee injection(series #2) Palliative bilateral lumbar facet RFA. (Right done 07/08/2016; 10/02/15; Left 12/09/2015)  Diagnostic right intra-articular shoulder injection Palliative right cervical epidural steroidinjection  Palliative bilateral genicular nerve RFA.    Palliative PRN treatment(s):   Palliative bilateral intra-articular Hyalgan knee injection Palliative right sided cervical epidural steroid injection  Palliative right-sided L2-3 interlaminar LESI    Provider-requested follow-up: Return for Procedure (no sedation): (R) CESI.  Future Appointments  Date Time Provider [place]  11/29/2017  9:30 AM Vevelyn Francois, NP [facility] None   Primary Care Physician: Gareth Morgan, MD Location: [facility] Outpatient Pain Management Facility Note by: Gaspar Cola, MD Date: 11/16/2017; Time: 6:35 PM

## 2017-11-16 NOTE — Progress Notes (Signed)
Safety precautions to be maintained throughout the outpatient stay will include: orient to surroundings, keep bed in low position, maintain call bell within reach at all times, provide assistance with transfer out of bed and ambulation.  

## 2017-11-16 NOTE — Patient Instructions (Addendum)
____________________________________________________________________________________________  Preparing for your procedure (without sedation)  Instructions: . Oral Intake: Do not eat or drink anything for at least 3 hours prior to your procedure. . Transportation: Unless otherwise stated by your physician, you may drive yourself after the procedure. . Blood Pressure Medicine: Take your blood pressure medicine with a sip of water the morning of the procedure. . Blood thinners:  . Diabetics on insulin: Notify the staff so that you can be scheduled 1st case in the morning. If your diabetes requires high dose insulin, take only  of your normal insulin dose the morning of the procedure and notify the staff that you have done so. . Preventing infections: Shower with an antibacterial soap the morning of your procedure.  . Build-up your immune system: Take 1000 mg of Vitamin C with every meal (3 times a day) the day prior to your procedure. . Antibiotics: Inform the staff if you have a condition or reason that requires you to take antibiotics before dental procedures. . Pregnancy: If you are pregnant, call and cancel the procedure. . Sickness: If you have a cold, fever, or any active infections, call and cancel the procedure. . Arrival: You must be in the facility at least 30 minutes prior to your scheduled procedure. . Children: Do not bring any children with you. . Dress appropriately: Bring dark clothing that you would not mind if they get stained. . Valuables: Do not bring any jewelry or valuables.  Procedure appointments are reserved for interventional treatments only. . No Prescription Refills. . No medication changes will be discussed during procedure appointments. . No disability issues will be discussed.  Remember:  Regular Business hours are:  Monday to Thursday 8:00 AM to 4:00 PM  Provider's Schedule: Ravyn Nikkel, MD:  Procedure days: Tuesday and Thursday 7:30 AM to 4:00  PM  Bilal Lateef, MD:  Procedure days: Monday and Wednesday 7:30 AM to 4:00 PM ____________________________________________________________________________________________    Epidural Steroid Injection An epidural steroid injection is a shot of steroid medicine and numbing medicine that is given into the space between the spinal cord and the bones in your back (epidural space). The shot helps relieve pain caused by an irritated or swollen nerve root. The amount of pain relief you get from the injection depends on what is causing the nerve to be swollen and irritated, and how long your pain lasts. You are more likely to benefit from this injection if your pain is strong and comes on suddenly rather than if you have had pain for a long time. Tell a health care provider about: Any allergies you have. All medicines you are taking, including vitamins, herbs, eye drops, creams, and over-the-counter medicines. Any problems you or family members have had with anesthetic medicines. Any blood disorders you have. Any surgeries you have had. Any medical conditions you have. Whether you are pregnant or may be pregnant. What are the risks? Generally, this is a safe procedure. However, problems may occur, including: Headache. Bleeding. Infection. Allergic reaction to medicines. Damage to your nerves.  What happens before the procedure? Staying hydrated Follow instructions from your health care provider about hydration, which may include: Up to 2 hours before the procedure - you may continue to drink clear liquids, such as water, clear fruit juice, black coffee, and plain tea.  Eating and drinking restrictions Follow instructions from your health care provider about eating and drinking, which may include: 8 hours before the procedure - stop eating heavy meals or foods such as   meat, fried foods, or fatty foods. 6 hours before the procedure - stop eating light meals or foods, such as toast or  cereal. 6 hours before the procedure - stop drinking milk or drinks that contain milk. 2 hours before the procedure - stop drinking clear liquids.  Medicine You may be given medicines to lower anxiety. Ask your health care provider about: Changing or stopping your regular medicines. This is especially important if you are taking diabetes medicines or blood thinners. Taking medicines such as aspirin and ibuprofen. These medicines can thin your blood. Do not take these medicines before your procedure if your health care provider instructs you not to. General instructions Plan to have someone take you home from the hospital or clinic. What happens during the procedure? You may receive a medicine to help you relax (sedative). You will be asked to lie on your abdomen. The injection site will be cleaned. A numbing medicine (local anesthetic) will be used to numb the injection site. A needle will be inserted through your skin into the epidural space. You may feel some discomfort when this happens. An X-ray machine will be used to make sure the needle is put as close as possible to the affected nerve. A steroid medicine and a local anesthetic will be injected into the epidural space. The needle will be removed. A bandage (dressing) will be put over the injection site. What happens after the procedure? Your blood pressure, heart rate, breathing rate, and blood oxygen level will be monitored until the medicines you were given have worn off. Your arm or leg may feel weak or numb for a few hours. The injection site may feel sore. Do not drive for 24 hours if you received a sedative. This information is not intended to replace advice given to you by your health care provider. Make sure you discuss any questions you have with your health care provider. Document Released: 12/14/2007 Document Revised: 02/18/2016 Document Reviewed: 12/23/2015 Elsevier Interactive Patient Education  2018 [employer]  

## 2017-11-29 ENCOUNTER — Encounter: Payer: Self-pay | Admitting: Nurse Practitioner

## 2017-11-29 ENCOUNTER — Ambulatory Visit (HOSPITAL_BASED_OUTPATIENT_CLINIC_OR_DEPARTMENT_OTHER): Payer: Medicare (Managed Care) | Admitting: Pain Medicine

## 2017-11-29 ENCOUNTER — Other Ambulatory Visit: Payer: Self-pay

## 2017-11-29 ENCOUNTER — Ambulatory Visit
Admission: RE | Admit: 2017-11-29 | Discharge: 2017-11-29 | Disposition: A | Discharge status: Home / Self Care | Payer: Medicare (Managed Care) | Source: Ambulatory Visit | Attending: Pain Medicine | Admitting: Pain Medicine

## 2017-11-29 VITALS — BP 133/95 | HR 92 | Temp 98.3°F | Resp 20 | Ht 69.0 in | Wt 292.0 lb

## 2017-11-29 DIAGNOSIS — M25561 Pain in right knee: Secondary | ICD-10-CM | POA: Diagnosis not present

## 2017-11-29 DIAGNOSIS — M542 Cervicalgia: Secondary | ICD-10-CM

## 2017-11-29 DIAGNOSIS — Z9841 Cataract extraction status, right eye: Secondary | ICD-10-CM | POA: Insufficient documentation

## 2017-11-29 DIAGNOSIS — M4722 Other spondylosis with radiculopathy, cervical region: Secondary | ICD-10-CM | POA: Insufficient documentation

## 2017-11-29 DIAGNOSIS — M25562 Pain in left knee: Secondary | ICD-10-CM | POA: Diagnosis not present

## 2017-11-29 DIAGNOSIS — M5010 Cervical disc disorder with radiculopathy, unspecified cervical region: Secondary | ICD-10-CM | POA: Insufficient documentation

## 2017-11-29 DIAGNOSIS — M5412 Radiculopathy, cervical region: Secondary | ICD-10-CM | POA: Diagnosis not present

## 2017-11-29 DIAGNOSIS — M170 Bilateral primary osteoarthritis of knee: Secondary | ICD-10-CM | POA: Diagnosis not present

## 2017-11-29 DIAGNOSIS — M47812 Spondylosis without myelopathy or radiculopathy, cervical region: Secondary | ICD-10-CM

## 2017-11-29 DIAGNOSIS — M5030 Other cervical disc degeneration, unspecified cervical region: Secondary | ICD-10-CM | POA: Diagnosis not present

## 2017-11-29 DIAGNOSIS — G8929 Other chronic pain: Secondary | ICD-10-CM | POA: Insufficient documentation

## 2017-11-29 MED ORDER — SODIUM CHLORIDE 0.9 % IJ SOLN
INTRAMUSCULAR | Status: AC
  Filled 2017-11-29: qty 10

## 2017-11-29 MED ORDER — LIDOCAINE HCL 2 % IJ SOLN
INTRAMUSCULAR | Status: AC
  Filled 2017-11-29: qty 20

## 2017-11-29 MED ORDER — IOPAMIDOL (ISOVUE-M 200) INJECTION 41%
10.0000 mL | Freq: Once | INTRAMUSCULAR | Status: AC
  Administered 2017-11-29: 10 mL via EPIDURAL
  Filled 2017-11-29: qty 10

## 2017-11-29 MED ORDER — DEXAMETHASONE SODIUM PHOSPHATE 10 MG/ML IJ SOLN
INTRAMUSCULAR | Status: AC
  Filled 2017-11-29: qty 1

## 2017-11-29 MED ORDER — DEXAMETHASONE SODIUM PHOSPHATE 10 MG/ML IJ SOLN
10.0000 mg | Freq: Once | INTRAMUSCULAR | Status: AC
  Administered 2017-11-29: 10 mg

## 2017-11-29 MED ORDER — ROPIVACAINE HCL 2 MG/ML IJ SOLN
INTRAMUSCULAR | Status: AC
  Filled 2017-11-29: qty 10

## 2017-11-29 MED ORDER — ROPIVACAINE HCL 2 MG/ML IJ SOLN
1.0000 mL | Freq: Once | INTRAMUSCULAR | Status: AC
  Administered 2017-11-29: 10 mL via EPIDURAL

## 2017-11-29 MED ORDER — LIDOCAINE HCL 2 % IJ SOLN
10.0000 mL | Freq: Once | INTRAMUSCULAR | Status: AC
  Administered 2017-11-29: 400 mg

## 2017-11-29 MED ORDER — SODIUM CHLORIDE 0.9% FLUSH
1.0000 mL | Freq: Once | INTRAVENOUS | Status: AC
  Administered 2017-11-29: 10 mL

## 2017-11-29 NOTE — Progress Notes (Signed)
Safety precautions to be maintained throughout the outpatient stay will include: orient to surroundings, keep bed in low position, maintain call bell within reach at all times, provide assistance with transfer out of bed and ambulation.  

## 2017-11-29 NOTE — Progress Notes (Signed)
Patient's Name: James Friedman  MRN: [id number]  Referring Provider: Bobbye Morton, MD  DOB: 1946-08-09  PCP: Bobbye Morton, MD  DOS: 11/29/2017  Note by: Oswaldo Done, MD  Service setting: Ambulatory outpatient  Specialty: Interventional Pain Management  Patient type: Established  Location: [facility] (AMB) Pain Management Facility  Visit type: Interventional Procedure   Primary Reason for Visit: Interventional Pain Management Treatment. CC: Neck Pain  Procedure:       Anesthesia, Analgesia, Anxiolysis:  Type: Therapeutic, Inter-Laminar, Epidural Steroid Injection #2  Region: Posterior Cervico-thoracic Region Level: C7-T1 Laterality: Right-Sided Paramedial  Type: Local Anesthesia Indication(s): Analgesia         Route: Infiltration ([place]/IM) IV Access: Declined Sedation: Declined  Local Anesthetic: Lidocaine 1-2%   Indications: 1. DDD (degenerative disc disease), cervical   2. Cervicalgia   3. 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 Cervical spondylosis   5. Chronic neck pain (Bilateral) (R>L)    Pain Score: Pre-procedure: 5 /10 Post-procedure: 0-No pain/10  Pre-op Assessment:  James Friedman is a 72 y.o. (year old), male patient, seen today for interventional treatment. He  has a past surgical history that includes Knee surgery (Bilateral); Neck surgery; Carpal tunnel release; Shoulder surgery (Right); Cataract extraction, extracapsular (Left, 08/04/2015); and Cataract extraction w/PHACO (Right, 04/19/2016). James Friedman has a current medication list which includes the following prescription(s): acetaminophen, albuterol, aluminum hydroxide, amlodipine, antiseptic oral rinse, atorvastatin, capsaicin, cetirizine, chlorpheniramine, clotrimazole, diclofenac sodium, docusate sodium, duloxetine, fluticasone, gabapentin, glucagon, hydroxyzine, insulin aspart, insulin detemir, lisinopril, magnesium, methocarbamol, metoprolol tartrate, mometasone, multivitamin, naloxone,  naproxen, omeprazole, oxycodone, oxygen-helium, pantoprazole, polyethylene glycol powder, promethazine, sitagliptin-metformin, testosterone cypionate, trazodone, vitamin c, and vitamin a & d. His primarily concern today is the Neck Pain  Initial Vital Signs:  Pulse Rate: 92 Temp: 98.3 F (36.8 C) Resp: 18 BP: 119/71 SpO2: 99 %  BMI: Estimated body mass index is 43.12 kg/m as calculated from the following:   Height as of this encounter: 5\' 9"  (1.753 m).   Weight as of this encounter: 292 lb (132.5 kg).  Risk Assessment: Allergies: Reviewed. He has No Known Allergies.  Allergy Precautions: None required Coagulopathies: Reviewed. None identified.  Blood-thinner therapy: None at this time Active Infection(s): Reviewed. None identified. James Friedman is afebrile  Site Confirmation: James Friedman was asked to confirm the procedure and laterality before marking the site Procedure checklist: Completed Consent: Before the procedure and under the influence of no sedative(s), amnesic(s), or anxiolytics, the patient was informed of the treatment options, risks and possible complications. To fulfill our ethical and legal obligations, as recommended by the American Medical Association's Code of Ethics, I have informed the patient of my clinical impression; the nature and purpose of the treatment or procedure; the risks, benefits, and possible complications of the intervention; the alternatives, including doing nothing; the risk(s) and benefit(s) of the alternative treatment(s) or procedure(s); and the risk(s) and benefit(s) of doing nothing. The patient was provided information about the general risks and possible complications associated with the procedure. These may include, but are not limited to: failure to achieve desired goals, infection, bleeding, organ or nerve damage, allergic reactions, paralysis, and death. In addition, the patient was informed of those risks and complications associated to  Spine-related procedures, such as failure to decrease pain; infection (i.e.: Meningitis, epidural or intraspinal abscess); bleeding (i.e.: epidural hematoma, subarachnoid hemorrhage, or any other type of intraspinal or peri-dural bleeding); organ or nerve damage (i.e.: Any type of peripheral nerve, nerve root,  or spinal cord injury) with subsequent damage to sensory, motor, and/or autonomic systems, resulting in permanent pain, numbness, and/or weakness of one or several areas of the body; allergic reactions; (i.e.: anaphylactic reaction); and/or death. Furthermore, the patient was informed of those risks and complications associated with the medications. These include, but are not limited to: allergic reactions (i.e.: anaphylactic or anaphylactoid reaction(s)); adrenal axis suppression; blood sugar elevation that in diabetics may result in ketoacidosis or comma; water retention that in patients with history of congestive heart failure may result in shortness of breath, pulmonary edema, and decompensation with resultant heart failure; weight gain; swelling or edema; medication-induced neural toxicity; particulate matter embolism and blood vessel occlusion with resultant organ, and/or nervous system infarction; and/or aseptic necrosis of one or more joints. Finally, the patient was informed that Medicine is not an exact science; therefore, there is also the possibility of unforeseen or unpredictable risks and/or possible complications that may result in a catastrophic outcome. The patient indicated having understood very clearly. We have given the patient no guarantees and we have made no promises. Enough time was given to the patient to ask questions, all of which were answered to the patient's satisfaction. James Friedman has indicated that he wanted to continue with the procedure. Attestation: I, the ordering provider, attest that I have discussed with the patient the benefits, risks, side-effects, alternatives,  likelihood of achieving goals, and potential problems during recovery for the procedure that I have provided informed consent. Date  Time: 11/29/2017  9:50 AM  Pre-Procedure Preparation:  Monitoring: As per clinic protocol. Respiration, ETCO2, SpO2, BP, heart rate and rhythm monitor placed and checked for adequate function Safety Precautions: Patient was assessed for positional comfort and pressure points before starting the procedure. Time-out: I initiated and conducted the "Time-out" before starting the procedure, as per protocol. The patient was asked to participate by confirming the accuracy of the "Time Out" information. Verification of the correct person, site, and procedure were performed and confirmed by me, the nursing staff, and the patient. "Time-out" conducted as per Joint Commission's Universal Protocol (UP.01.01.01). Time: 1052  Description of Procedure Process:   Position: Prone with head of the table was raised to facilitate breathing. Target Area: For Epidural Steroid injections the target is the interlaminar space, initially targeting the lower border of the superior vertebral body lamina. Approach: Paramedial approach. Area Prepped: Entire PosteriorCervical Region Prepping solution: ChloraPrep (2% chlorhexidine gluconate and 70% isopropyl alcohol) Safety Precautions: Aspiration looking for blood return was conducted prior to all injections. At no point did we inject any substances, as a needle was being advanced. No attempts were made at seeking any paresthesias. Safe injection practices and needle disposal techniques used. Medications properly checked for expiration dates. SDV (single dose vial) medications used. Description of the Procedure: Protocol guidelines were followed. The procedure needle was introduced through the skin, ipsilateral to the reported pain, and advanced to the target area. Bone was contacted and the needle walked caudad, until the lamina was cleared. The  epidural space was identified using "loss-of-resistance technique" with 2-3 ml of PF-NaCl (0.9% NSS), in a 5cc LOR glass syringe. Vitals:   11/29/17 1047 11/29/17 1055 11/29/17 1100 11/29/17 1106  BP: 140/81 (!) 140/100 (!) 137/103 (!) 133/95  Pulse:      Resp: 17 16 12 20   Temp:      TempSrc:      SpO2: 95% 95% 94% 97%  Weight:      Height:  Start Time: 1052 hrs. End Time: 1103 hrs. Materials:  Needle(s) Type: Epidural needle Gauge: 17G Length: 3.5-in Medication(s): Please see orders for medications and dosing details.  Imaging Guidance (Spinal):  Type of Imaging Technique: Fluoroscopy Guidance (Spinal) Indication(s): Assistance in needle guidance and placement for procedures requiring needle placement in or near specific anatomical locations not easily accessible without such assistance. Exposure Time: Please see nurses notes. Contrast: Before injecting any contrast, we confirmed that the patient did not have an allergy to iodine, shellfish, or radiological contrast. Once satisfactory needle placement was completed at the desired level, radiological contrast was injected. Contrast injected under live fluoroscopy. No contrast complications. See chart for type and volume of contrast used. Fluoroscopic Guidance: I was personally present during the use of fluoroscopy. "Tunnel Vision Technique" used to obtain the best possible view of the target area. Parallax error corrected before commencing the procedure. "Direction-depth-direction" technique used to introduce the needle under continuous pulsed fluoroscopy. Once target was reached, antero-posterior, oblique, and lateral fluoroscopic projection used confirm needle placement in all planes. Images permanently stored in EMR. Interpretation: I personally interpreted the imaging intraoperatively. Adequate needle placement confirmed in multiple planes. Appropriate spread of contrast into desired area was observed. No evidence of afferent or  efferent intravascular uptake. No intrathecal or subarachnoid spread observed. Permanent images saved into the patient's record.  Antibiotic Prophylaxis:   Anti-infectives (From admission, onward)   None     Indication(s): None identified  Post-operative Assessment:  Post-procedure Vital Signs:  Pulse Rate: 92 Temp: 98.3 F (36.8 C) Resp: 20 BP: (!) 133/95 SpO2: 97 %  EBL: None  Complications: No immediate post-treatment complications observed by team, or reported by patient.  Note: The patient tolerated the entire procedure well. A repeat set of vitals were taken after the procedure and the patient was kept under observation following institutional policy, for this type of procedure. Post-procedural neurological assessment was performed, showing return to baseline, prior to discharge. The patient was provided with post-procedure discharge instructions, including a section on how to identify potential problems. Should any problems arise concerning this procedure, the patient was given instructions to immediately contact us, at any time, without hesitation. In any case, we plan to contact the patient by telephone for a follow-up status report regarding this interventional procedure.  Comments:  No additional relevant information.  Plan of Care   Possible POC:  Palliative right intra-articular Hyalgan knee injection #1 (last series completed on 05/31/2017)   Imaging Orders     DG C-Arm 1-60 Min-No Report  Procedure Orders     Cervical Epidural Injection     KNEE INJECTION  Medications ordered for procedure: Meds ordered this encounter  Medications  . iopamidol (ISOVUE-M) 41 % intrathecal injection 10 mL    Must be Myelogram-compatible. If not available, you may substitute with a water-soluble, non-ionic, hypoallergenic, myelogram-compatible radiological contrast medium.  Marland Kitchen lidocaine (XYLOCAINE) 2 % (with pres) injection 200 mg  . sodium chloride flush (NS) 0.9 % injection 1  mL  . ropivacaine (PF) 2 mg/mL (0.2%) (NAROPIN) injection 1 mL  . dexamethasone (DECADRON) injection 10 mg   Medications administered: We administered iopamidol, lidocaine, sodium chloride flush, ropivacaine (PF) 2 mg/mL (0.2%), and dexamethasone.  See the medical record for exact dosing, route, and time of administration.  New Prescriptions   No medications on file   Disposition: Discharge home  Discharge Date & Time: 11/29/2017; 1110 hrs.   Physician-requested Follow-up: Return for Procedure (no sedation): (R) IA Hyalgan Knee  inj. #1 + Post-Procedure Eval..  Future Appointments  Date Time Provider Department Center  12/13/2017 10:15 AM Delano MetzNaveira, Dania Marsan, MD [facility] None   Primary Care Physician: Bobbye Mortoneilly, Sharon A, MD [facility] Note by: Oswaldo DoneFrancisco A Sederick Jacobsen, MD Date: 11/29/2017; Time: 11:19 AM  Disclaimer:  Medicine is not an Visual merchandiserexact science. The only guarantee in medicine is that nothing is guaranteed. It is important to note that the decision to proceed with this intervention was based on the information collected from the patient. The Data and conclusions were drawn from the patient's questionnaire, the interview, and the physical examination. Because the information was provided in large part by the patient, it cannot be guaranteed that it has not been purposely or unconsciously manipulated. Every effort has been made to obtain as much relevant data as possible for this evaluation. It is important to note that the conclusions that lead to this procedure are derived in large part from the available data. Always take into account that the treatment will also be dependent on availability of resources and existing treatment guidelines, considered by other Pain Management Practitioners as being common knowledge and practice, at the time of the intervention. For Medico-Legal purposes, it is also important to point out that variation in procedural  techniques and pharmacological choices are the acceptable norm. The indications, contraindications, technique, and results of the above procedure should only be interpreted and judged by a Board-Certified Interventional Pain Specialist with extensive familiarity and expertise in the same exact procedure and technique.

## 2017-11-29 NOTE — Patient Instructions (Addendum)
____________________________________________________________________________________________  Post-Procedure Discharge Instructions  Instructions:  Apply ice: Fill a plastic sandwich bag with crushed ice. Cover it with a small towel and apply to injection site. Apply for 15 minutes then remove x 15 minutes. Repeat sequence on day of procedure, until you go to bed. The purpose is to minimize swelling and discomfort after procedure.  Apply heat: Apply heat to procedure site starting the day following the procedure. The purpose is to treat any soreness and discomfort from the procedure.  Food intake: Start with clear liquids (like water) and advance to regular food, as tolerated.   Physical activities: Keep activities to a minimum for the first 8 hours after the procedure.   Driving: If you have received any sedation, you are not allowed to drive for 24 hours after your procedure.  Blood thinner: Restart your blood thinner 6 hours after your procedure. (Only for those taking blood thinners)  Insulin: As soon as you can eat, you may resume your normal dosing schedule. (Only for those taking insulin)  Infection prevention: Keep procedure site clean and dry.  Post-procedure Pain Diary: Extremely important that this be done correctly and accurately. Recorded information will be used to determine the next step in treatment.  Pain evaluated is that of treated area only. Do not include pain from an untreated area.  Complete every hour, on the hour, for the initial 8 hours. Set an alarm to help you do this part accurately.  Do not go to sleep and have it completed later. It will not be accurate.  Follow-up appointment: Keep your follow-up appointment after the procedure. Usually 2 weeks for most procedures. (6 weeks in the case of radiofrequency.) Bring you pain diary.   Expect:  From numbing medicine (AKA: Local Anesthetics): Numbness or decrease in pain.  Onset: Full effect within 15  minutes of injected.  Duration: It will depend on the type of local anesthetic used. On the average, 1 to 8 hours.   From steroids: Decrease in swelling or inflammation. Once inflammation is improved, relief of the pain will follow.  Onset of benefits: Depends on the amount of swelling present. The more swelling, the longer it will take for the benefits to be seen. In some cases, up to 10 days.  Duration: Steroids will stay in the system x 2 weeks. Duration of benefits will depend on multiple posibilities including persistent irritating factors.  From procedure: Some discomfort is to be expected once the numbing medicine wears off. This should be minimal if ice and heat are applied as instructed.  Call if:  You experience numbness and weakness that gets worse with time, as opposed to wearing off.  New onset bowel or bladder incontinence. (This applies to Spinal procedures only)  Emergency Numbers:  Durning business hours (Monday - Thursday, 8:00 AM - 4:00 PM) (Friday, 9:00 AM - 12:00 Noon): [phone]  After hours: [phone] ____________________________________________________________________________________________   ____________________________________________________________________________________________  Preparing for your procedure (without sedation)  Instructions: . Oral Intake: Do not eat or drink anything for at least 3 hours prior to your procedure. . Transportation: Unless otherwise stated by your physician, you may drive yourself after the procedure. . Blood Pressure Medicine: Take your blood pressure medicine with a sip of water the morning of the procedure. . Blood thinners:  . Diabetics on insulin: Notify the staff so that you can be scheduled 1st case in the morning. If your diabetes requires high dose insulin, take only  of your normal insulin dose   the morning of the procedure and notify the staff that you have done so. . Preventing infections: Shower  with an antibacterial soap the morning of your procedure.  . Build-up your immune system: Take 1000 mg of Vitamin C with every meal (3 times a day) the day prior to your procedure. . Antibiotics: Inform the staff if you have a condition or reason that requires you to take antibiotics before dental procedures. . Pregnancy: If you are pregnant, call and cancel the procedure. . Sickness: If you have a cold, fever, or any active infections, call and cancel the procedure. . Arrival: You must be in the facility at least 30 minutes prior to your scheduled procedure. . Children: Do not bring any children with you. . Dress appropriately: Bring dark clothing that you would not mind if they get stained. . Valuables: Do not bring any jewelry or valuables.  Procedure appointments are reserved for interventional treatments only. . No Prescription Refills. . No medication changes will be discussed during procedure appointments. . No disability issues will be discussed.  Remember:  Regular Business hours are:  Monday to Thursday 8:00 AM to 4:00 PM  Provider's Schedule: Lj Miyamoto, MD:  Procedure days: Tuesday and Thursday 7:30 AM to 4:00 PM  Bilal Lateef, MD:  Procedure days: Monday and Wednesday 7:30 AM to 4:00 PM ____________________________________________________________________________________________    

## 2017-11-30 ENCOUNTER — Telehealth: Payer: Self-pay | Admitting: *Deleted

## 2017-11-30 NOTE — Telephone Encounter (Signed)
Attempted to call for post procedure follow-up. Message left. 

## 2017-12-12 NOTE — Progress Notes (Signed)
Patient's Name: James Friedman  MRN: [id number]  Referring Provider: Bobbye Morton, MD  DOB: 09/19/1946  PCP: Bobbye Morton, MD  DOS: 12/13/2017  Note by: Oswaldo Done, MD  Service setting: Ambulatory outpatient  Specialty: Interventional Pain Management  Patient type: Established  Location: [facility] (AMB) Pain Management Facility  Visit type: Interventional Procedure   Primary Reason for Visit: Interventional Pain Management Treatment. CC: Knee Pain (bilateral)  Procedure:  Anesthesia, Analgesia, Anxiolysis:  Type: Therapeutic Intra-Articular Hyalgan Knee Injection #1  Region: Lateral infrapatellar Knee Region Level: Knee Joint Laterality: Right knee  Type: Local Anesthesia Indication(s): Analgesia         Local Anesthetic: Lidocaine 1-2% Route: Infiltration ([place]/IM) IV Access: Declined Sedation: Declined    Indications: 1. Osteoarthritis of knee (Bilateral)   2. Chronic knee pain (Bilateral)    Pain Score: Pre-procedure: 6 /10 Post-procedure: 0-No pain/10  Post-Procedure Assessment  11/29/2017 Procedure: Therapeutic right-sided cervical epidural steroid injection #2 under fluoroscopic guidance, no sedation. Pre-procedure pain score:  5/10 Post-procedure pain score: 0/10 (100% relief) Influential Factors: BMI: 43.12 kg/m Intra-procedural challenges: None observed.         Assessment challenges: None detected.              Reported side-effects: None.        Post-procedural adverse reactions or complications: None reported         Sedation: No sedation used. When no sedatives are used, the analgesic levels obtained are directly associated to the effectiveness of the local anesthetics. However, when sedation is provided, the level of analgesia obtained during the initial 1 hour following the intervention, is believed to be the result of a combination of factors. These factors may include, but are not limited to: 1. The effectiveness of the local anesthetics used. 2.  The effects of the analgesic(s) and/or anxiolytic(s) used. 3. The degree of discomfort experienced by the patient at the time of the procedure. 4. The patients ability and reliability in recalling and recording the events. 5. The presence and influence of possible secondary gains and/or psychosocial factors. Reported result: Relief experienced during the 1st hour after the procedure: 100 % (Ultra-Short Term Relief) James Friedman has indicated area to have been numb during this time. Interpretative annotation: Clinically appropriate result. No IV Analgesic or Anxiolytic given, therefore benefits are completely due to Local Anesthetic effects. Such findings would suggest that 100% of the pain, in the treated area, is coming from the blocked structure.  Effects of local anesthetic: The analgesic effects attained during this period are directly associated to the localized infiltration of local anesthetics and therefore cary significant diagnostic value as to the etiological location, or anatomical origin, of the pain. Expected duration of relief is directly dependent on the pharmacodynamics of the local anesthetic used. Long-acting (4-6 hours) anesthetics used.  Reported result: Relief during the next 4 to 6 hour after the procedure: 100 % (Short-Term Relief) James Friedman has indicated area to have been numb during this time. Interpretative annotation: Clinically appropriate result. Analgesia during this period is likely to be Local Anesthetic-related.          Long-term benefit: Defined as the period of time past the expected duration of local anesthetics (1 hour for short-acting and 4-6 hours for long-acting). With the possible exception of prolonged sympathetic blockade from the local anesthetics, benefits during this period are typically attributed to, or associated with, other factors such as analgesic sensory neuropraxia, antiinflammatory effects, or beneficial biochemical changes provided  by agents other  than the local anesthetics.  Reported result: Extended relief following procedure: 100 % (Long-Term Relief)            Interpretative annotation: Clinically possible results. Good relief. No permanent benefit expected. Inflammation plays a part in the etiology to the pain.          Current benefits: Defined as reported results that persistent at this point in time.   Analgesia: 100 % James Friedman reports that both, extremity and the axial pain improved with the treatment. Function: James Friedman reports improvement in function ROM: James Friedman reports improvement in ROM Interpretative annotation: Ongoing benefit. Therapeutic success. Effective therapeutic approach.          Interpretation: Results would suggest a successful diagnostic intervention.                  Plan:  Set up procedure as a PRN palliative treatment option for this patient. The patient comes in today for a right-sided intra-articular Hyalgan injection #1.           Pre-op Assessment:  James Friedman is a 72 y.o. (year old), male patient, seen today for interventional treatment. He  has a past surgical history that includes Knee surgery (Bilateral); Neck surgery; Carpal tunnel release; Shoulder surgery (Right); Cataract extraction, extracapsular (Left, 08/04/2015); and Cataract extraction w/PHACO (Right, 04/19/2016). James Friedman has a current medication list which includes the following prescription(s): acetaminophen, albuterol, aluminum hydroxide, amlodipine, antiseptic oral rinse, atorvastatin, capsaicin, cetirizine, chlorpheniramine, clotrimazole, diclofenac sodium, docusate sodium, duloxetine, fluticasone, gabapentin, glucagon, hydroxyzine, insulin aspart, insulin detemir, lisinopril, magnesium, methocarbamol, metoprolol tartrate, mometasone, multivitamin, naloxone, naproxen, omeprazole, oxycodone, oxygen-helium, pantoprazole, polyethylene glycol powder, promethazine, sitagliptin-metformin, testosterone cypionate, trazodone, vitamin c,  and vitamin a & d. His primarily concern today is the Knee Pain (bilateral)  Initial Vital Signs:  Pulse Rate: 89 Temp: 98.9 F (37.2 C) Resp: 16 BP: 131/71 SpO2: 96 %  BMI: Estimated body mass index is 43.12 kg/m as calculated from the following:   Height as of this encounter: 5\' 9"  (1.753 m).   Weight as of this encounter: 292 lb (132.5 kg).  Risk Assessment: Allergies: Reviewed. He has No Known Allergies.  Allergy Precautions: None required Coagulopathies: Reviewed. None identified.  Blood-thinner therapy: None at this time Active Infection(s): Reviewed. None identified. James Friedman is afebrile  Site Confirmation: James Friedman was asked to confirm the procedure and laterality before marking the site Procedure checklist: Completed Consent: Before the procedure and under the influence of no sedative(s), amnesic(s), or anxiolytics, the patient was informed of the treatment options, risks and possible complications. To fulfill our ethical and legal obligations, as recommended by the American Medical Association's Code of Ethics, I have informed the patient of my clinical impression; the nature and purpose of the treatment or procedure; the risks, benefits, and possible complications of the intervention; the alternatives, including doing nothing; the risk(s) and benefit(s) of the alternative treatment(s) or procedure(s); and the risk(s) and benefit(s) of doing nothing. The patient was provided information about the general risks and possible complications associated with the procedure. These may include, but are not limited to: failure to achieve desired goals, infection, bleeding, organ or nerve damage, allergic reactions, paralysis, and death. In addition, the patient was informed of those risks and complications associated to the procedure, such as failure to decrease pain; infection; bleeding; organ or nerve damage with subsequent damage to sensory, motor, and/or autonomic systems,  resulting in permanent pain, numbness, and/or weakness of one  or several areas of the body; allergic reactions; (i.e.: anaphylactic reaction); and/or death. Furthermore, the patient was informed of those risks and complications associated with the medications. These include, but are not limited to: allergic reactions (i.e.: anaphylactic or anaphylactoid reaction(s)); adrenal axis suppression; blood sugar elevation that in diabetics may result in ketoacidosis or comma; water retention that in patients with history of congestive heart failure may result in shortness of breath, pulmonary edema, and decompensation with resultant heart failure; weight gain; swelling or edema; medication-induced neural toxicity; particulate matter embolism and blood vessel occlusion with resultant organ, and/or nervous system infarction; and/or aseptic necrosis of one or more joints. Finally, the patient was informed that Medicine is not an exact science; therefore, there is also the possibility of unforeseen or unpredictable risks and/or possible complications that may result in a catastrophic outcome. The patient indicated having understood very clearly. We have given the patient no guarantees and we have made no promises. Enough time was given to the patient to ask questions, all of which were answered to the patient's satisfaction. James Friedman has indicated that he wanted to continue with the procedure. Attestation: I, the ordering provider, attest that I have discussed with the patient the benefits, risks, side-effects, alternatives, likelihood of achieving goals, and potential problems during recovery for the procedure that I have provided informed consent. Date  Time: 12/13/2017 10:17 AM  Pre-Procedure Preparation:  Monitoring: As per clinic protocol. Respiration, ETCO2, SpO2, BP, heart rate and rhythm monitor placed and checked for adequate function Safety Precautions: Patient was assessed for positional comfort and  pressure points before starting the procedure. Time-out: I initiated and conducted the "Time-out" before starting the procedure, as per protocol. The patient was asked to participate by confirming the accuracy of the "Time Out" information. Verification of the correct person, site, and procedure were performed and confirmed by me, the nursing staff, and the patient. "Time-out" conducted as per Joint Commission's Universal Protocol (UP.01.01.01). Time: 1036  Description of Procedure:       Position: Sitting Target Area: Knee Joint Approach: Just above the Lateral tibial plateau, lateral to the infrapatellar tendon. Area Prepped: Entire knee area, from the mid-thigh to the mid-shin. Prepping solution: ChloraPrep (2% chlorhexidine gluconate and 70% isopropyl alcohol) Safety Precautions: Aspiration looking for blood return was conducted prior to all injections. At no point did we inject any substances, as a needle was being advanced. No attempts were made at seeking any paresthesias. Safe injection practices and needle disposal techniques used. Medications properly checked for expiration dates. SDV (single dose vial) medications used. Description of the Procedure: Protocol guidelines were followed. The patient was placed in position over the fluoroscopy table. The target area was identified and the area prepped in the usual manner. Skin desensitized using vapocoolant spray. Skin & deeper tissues infiltrated with local anesthetic. Appropriate amount of time allowed to pass for local anesthetics to take effect. The procedure needles were then advanced to the target area. Proper needle placement secured. Negative aspiration confirmed. Solution injected in intermittent fashion, asking for systemic symptoms every 0.5cc of injectate. The needles were then removed and the area cleansed, making sure to leave some of the prepping solution back to take advantage of its long term bactericidal properties. Vitals:    12/13/17 1015 12/13/17 1038  BP: 131/71 (!) 147/90  Pulse: 89 86  Resp: 16 18  Temp: 98.9 F (37.2 C)   TempSrc: Oral   SpO2: 96% 98%  Weight: 292 lb (132.5 kg)  Height: 5\' 9"  (1.753 m)     Start Time: 1036 hrs. End Time: 1037 hrs. Materials:  Needle(s) Type: Regular needle Gauge: 25G Length: 1.5-in Medication(s): Please see orders for medications and dosing details.  Imaging Guidance:  Type of Imaging Technique: None used Indication(s): N/A Exposure Time: No patient exposure Contrast: None used. Fluoroscopic Guidance: N/A Ultrasound Guidance: N/A Interpretation: N/A  Antibiotic Prophylaxis:   Anti-infectives (From admission, onward)   None     Indication(s): None identified  Post-operative Assessment:  Post-procedure Vital Signs:  Pulse Rate: 86 Temp: 98.9 F (37.2 C) Resp: 18 BP: (!) 147/90 SpO2: 98 %  EBL: None  Complications: No immediate post-treatment complications observed by team, or reported by patient.  Note: The patient tolerated the entire procedure well. A repeat set of vitals were taken after the procedure and the patient was kept under observation following institutional policy, for this type of procedure. Post-procedural neurological assessment was performed, showing return to baseline, prior to discharge. The patient was provided with post-procedure discharge instructions, including a section on how to identify potential problems. Should any problems arise concerning this procedure, the patient was given instructions to immediately contact us, at any time, without hesitation. In any case, we plan to contact the patient by telephone for a follow-up status report regarding this interventional procedure.  Comments:  No additional relevant information.  Plan of Care   Possible POC:  The patient will be returning for a right-sided intra-articular Hyalgan knee injection #2, no fluoroscopy or IV sedation.  He indicates that he has been experiencing a  little bit more pain in the right lower back and he would like to see if there is anything that we can do for that.  The pain appears to be secondary to a facet joint syndrome and its localized around the area of the right PSIS.  Since the patient recently had a cervical epidural steroid injection, I will be bringing him back for a right lumbar facet around the time of his third Hyalgan knee injection for a palliative right-sided lumbar facet block under fluoroscopic guidance   Imaging Orders  No imaging studies ordered today   Procedure Orders     KNEE INJECTION (today) (right intra-articular Hyalgan No. 1)     KNEE INJECTION (in 2 weeks) (right intra-articular Hyalgan No. 2)  Medications ordered for procedure: Meds ordered this encounter  Medications  . lidocaine (PF) (XYLOCAINE) 1 % injection 4 mL  . ropivacaine (PF) 2 mg/mL (0.2%) (NAROPIN) injection 4 mL  . Sodium Hyaluronate SOSY 2 mL   Medications administered: We administered lidocaine (PF), ropivacaine (PF) 2 mg/mL (0.2%), and Sodium Hyaluronate.  See the medical record for exact dosing, route, and time of administration.  New Prescriptions   No medications on file   Disposition: Discharge home  Discharge Date & Time: 12/13/2017; 1040 hrs.   Physician-requested Follow-up: Return in about 2 weeks (around 12/27/2017) for Procedure (no sedation): (R) Hyalgan #2.  No future appointments. Primary Care Physician: Bobbye Morton, MD Location: [facility] Note by: Oswaldo Done, MD Date: 12/13/2017; Time: 10:56 AM  Disclaimer:  Medicine is not an exact science. The only guarantee in medicine is that nothing is guaranteed. It is important to note that the decision to proceed with this intervention was based on the information collected from the patient. The Data and conclusions were drawn from the patient's questionnaire, the interview, and the physical examination. Because the information was  provided in large  part by the patient, it cannot be guaranteed that it has not been purposely or unconsciously manipulated. Every effort has been made to obtain as much relevant data as possible for this evaluation. It is important to note that the conclusions that lead to this procedure are derived in large part from the available data. Always take into account that the treatment will also be dependent on availability of resources and existing treatment guidelines, considered by other Pain Management Practitioners as being common knowledge and practice, at the time of the intervention. For Medico-Legal purposes, it is also important to point out that variation in procedural techniques and pharmacological choices are the acceptable norm. The indications, contraindications, technique, and results of the above procedure should only be interpreted and judged by a Board-Certified Interventional Pain Specialist with extensive familiarity and expertise in the same exact procedure and technique.

## 2017-12-13 ENCOUNTER — Ambulatory Visit: Payer: Medicare (Managed Care) | Attending: Pain Medicine | Admitting: Pain Medicine

## 2017-12-13 ENCOUNTER — Other Ambulatory Visit: Payer: Self-pay

## 2017-12-13 ENCOUNTER — Encounter: Payer: Self-pay | Admitting: Pain Medicine

## 2017-12-13 VITALS — BP 147/90 | HR 86 | Temp 98.9°F | Resp 18 | Ht 69.0 in | Wt 292.0 lb

## 2017-12-13 DIAGNOSIS — Z79891 Long term (current) use of opiate analgesic: Secondary | ICD-10-CM | POA: Insufficient documentation

## 2017-12-13 DIAGNOSIS — M25561 Pain in right knee: Secondary | ICD-10-CM | POA: Diagnosis not present

## 2017-12-13 DIAGNOSIS — G8929 Other chronic pain: Secondary | ICD-10-CM | POA: Insufficient documentation

## 2017-12-13 DIAGNOSIS — M25562 Pain in left knee: Secondary | ICD-10-CM | POA: Diagnosis not present

## 2017-12-13 DIAGNOSIS — Z9841 Cataract extraction status, right eye: Secondary | ICD-10-CM | POA: Insufficient documentation

## 2017-12-13 DIAGNOSIS — Z9889 Other specified postprocedural states: Secondary | ICD-10-CM | POA: Insufficient documentation

## 2017-12-13 DIAGNOSIS — Z79899 Other long term (current) drug therapy: Secondary | ICD-10-CM | POA: Diagnosis not present

## 2017-12-13 DIAGNOSIS — Z794 Long term (current) use of insulin: Secondary | ICD-10-CM | POA: Insufficient documentation

## 2017-12-13 DIAGNOSIS — M170 Bilateral primary osteoarthritis of knee: Secondary | ICD-10-CM | POA: Insufficient documentation

## 2017-12-13 DIAGNOSIS — Z791 Long term (current) use of non-steroidal anti-inflammatories (NSAID): Secondary | ICD-10-CM | POA: Diagnosis not present

## 2017-12-13 MED ORDER — ROPIVACAINE HCL 2 MG/ML IJ SOLN
4.0000 mL | Freq: Once | INTRAMUSCULAR | Status: AC
  Administered 2017-12-13: 10 mL via INTRA_ARTICULAR

## 2017-12-13 MED ORDER — LIDOCAINE HCL (PF) 1 % IJ SOLN
4.0000 mL | Freq: Once | INTRAMUSCULAR | Status: AC
  Administered 2017-12-13: 5 mL

## 2017-12-13 MED ORDER — ROPIVACAINE HCL 2 MG/ML IJ SOLN
INTRAMUSCULAR | Status: AC
  Filled 2017-12-13: qty 10

## 2017-12-13 MED ORDER — SODIUM HYALURONATE (VISCOSUP) 20 MG/2ML IX SOSY
2.0000 mL | PREFILLED_SYRINGE | Freq: Once | INTRA_ARTICULAR | Status: AC
  Administered 2017-12-13: 2 mL via INTRA_ARTICULAR
  Filled 2017-12-13: qty 2

## 2017-12-13 MED ORDER — LIDOCAINE HCL (PF) 1 % IJ SOLN
INTRAMUSCULAR | Status: AC
  Filled 2017-12-13: qty 5

## 2017-12-13 NOTE — Patient Instructions (Signed)
____________________________________________________________________________________________  Post-Procedure Discharge Instructions  Instructions:  Apply ice: Fill a plastic sandwich bag with crushed ice. Cover it with a small towel and apply to injection site. Apply for 15 minutes then remove x 15 minutes. Repeat sequence on day of procedure, until you go to bed. The purpose is to minimize swelling and discomfort after procedure.  Apply heat: Apply heat to procedure site starting the day following the procedure. The purpose is to treat any soreness and discomfort from the procedure.  Food intake: Start with clear liquids (like water) and advance to regular food, as tolerated.   Physical activities: Keep activities to a minimum for the first 8 hours after the procedure.   Driving: If you have received any sedation, you are not allowed to drive for 24 hours after your procedure.  Blood thinner: Restart your blood thinner 6 hours after your procedure. (Only for those taking blood thinners)  Insulin: As soon as you can eat, you may resume your normal dosing schedule. (Only for those taking insulin)  Infection prevention: Keep procedure site clean and dry.  Post-procedure Pain Diary: Extremely important that this be done correctly and accurately. Recorded information will be used to determine the next step in treatment.  Pain evaluated is that of treated area only. Do not include pain from an untreated area.  Complete every hour, on the hour, for the initial 8 hours. Set an alarm to help you do this part accurately.  Do not go to sleep and have it completed later. It will not be accurate.  Follow-up appointment: Keep your follow-up appointment after the procedure. Usually 2 weeks for most procedures. (6 weeks in the case of radiofrequency.) Bring you pain diary.   Expect:  From numbing medicine (AKA: Local Anesthetics): Numbness or decrease in pain.  Onset: Full effect within 15  minutes of injected.  Duration: It will depend on the type of local anesthetic used. On the average, 1 to 8 hours.   From steroids: Decrease in swelling or inflammation. Once inflammation is improved, relief of the pain will follow.  Onset of benefits: Depends on the amount of swelling present. The more swelling, the longer it will take for the benefits to be seen. In some cases, up to 10 days.  Duration: Steroids will stay in the system x 2 weeks. Duration of benefits will depend on multiple posibilities including persistent irritating factors.  From procedure: Some discomfort is to be expected once the numbing medicine wears off. This should be minimal if ice and heat are applied as instructed.  Call if:  You experience numbness and weakness that gets worse with time, as opposed to wearing off.  New onset bowel or bladder incontinence. (This applies to Spinal procedures only)  Emergency Numbers:  Durning business hours (Monday - Thursday, 8:00 AM - 4:00 PM) (Friday, 9:00 AM - 12:00 Noon): [phone]  After hours: [phone] ____________________________________________________________________________________________   ____________________________________________________________________________________________  Preparing for your procedure (without sedation)  Instructions: . Oral Intake: Do not eat or drink anything for at least 3 hours prior to your procedure. . Transportation: Unless otherwise stated by your physician, you may drive yourself after the procedure. . Blood Pressure Medicine: Take your blood pressure medicine with a sip of water the morning of the procedure. . Blood thinners:  . Diabetics on insulin: Notify the staff so that you can be scheduled 1st case in the morning. If your diabetes requires high dose insulin, take only  of your normal insulin dose   the morning of the procedure and notify the staff that you have done so. . Preventing infections: Shower  with an antibacterial soap the morning of your procedure.  . Build-up your immune system: Take 1000 mg of Vitamin C with every meal (3 times a day) the day prior to your procedure. . Antibiotics: Inform the staff if you have a condition or reason that requires you to take antibiotics before dental procedures. . Pregnancy: If you are pregnant, call and cancel the procedure. . Sickness: If you have a cold, fever, or any active infections, call and cancel the procedure. . Arrival: You must be in the facility at least 30 minutes prior to your scheduled procedure. . Children: Do not bring any children with you. . Dress appropriately: Bring dark clothing that you would not mind if they get stained. . Valuables: Do not bring any jewelry or valuables.  Procedure appointments are reserved for interventional treatments only. . No Prescription Refills. . No medication changes will be discussed during procedure appointments. . No disability issues will be discussed.  Remember:  Regular Business hours are:  Monday to Thursday 8:00 AM to 4:00 PM  Provider's Schedule: Eulamae Greenstein, MD:  Procedure days: Tuesday and Thursday 7:30 AM to 4:00 PM  Bilal Lateef, MD:  Procedure days: Monday and Wednesday 7:30 AM to 4:00 PM ____________________________________________________________________________________________    

## 2017-12-14 ENCOUNTER — Telehealth: Payer: Self-pay

## 2017-12-14 NOTE — Telephone Encounter (Signed)
Post procedure phone call.  States he is doing fine.  

## 2017-12-27 ENCOUNTER — Other Ambulatory Visit: Payer: Self-pay

## 2017-12-27 ENCOUNTER — Ambulatory Visit: Payer: Medicare (Managed Care) | Attending: Pain Medicine | Admitting: Pain Medicine

## 2017-12-27 ENCOUNTER — Encounter: Payer: Self-pay | Admitting: Pain Medicine

## 2017-12-27 VITALS — BP 160/90 | HR 86 | Temp 98.0°F | Resp 18 | Ht 69.0 in | Wt 290.0 lb

## 2017-12-27 DIAGNOSIS — M25561 Pain in right knee: Secondary | ICD-10-CM | POA: Insufficient documentation

## 2017-12-27 DIAGNOSIS — Z9842 Cataract extraction status, left eye: Secondary | ICD-10-CM | POA: Diagnosis not present

## 2017-12-27 DIAGNOSIS — M48062 Spinal stenosis, lumbar region with neurogenic claudication: Secondary | ICD-10-CM

## 2017-12-27 DIAGNOSIS — W19XXXA Unspecified fall, initial encounter: Secondary | ICD-10-CM | POA: Insufficient documentation

## 2017-12-27 DIAGNOSIS — Z79899 Other long term (current) drug therapy: Secondary | ICD-10-CM | POA: Diagnosis not present

## 2017-12-27 DIAGNOSIS — Z794 Long term (current) use of insulin: Secondary | ICD-10-CM | POA: Insufficient documentation

## 2017-12-27 DIAGNOSIS — M545 Low back pain: Secondary | ICD-10-CM | POA: Diagnosis not present

## 2017-12-27 DIAGNOSIS — Z7984 Long term (current) use of oral hypoglycemic drugs: Secondary | ICD-10-CM | POA: Insufficient documentation

## 2017-12-27 DIAGNOSIS — M5136 Other intervertebral disc degeneration, lumbar region: Secondary | ICD-10-CM

## 2017-12-27 DIAGNOSIS — M25562 Pain in left knee: Secondary | ICD-10-CM | POA: Diagnosis not present

## 2017-12-27 DIAGNOSIS — M48061 Spinal stenosis, lumbar region without neurogenic claudication: Secondary | ICD-10-CM

## 2017-12-27 DIAGNOSIS — M170 Bilateral primary osteoarthritis of knee: Secondary | ICD-10-CM | POA: Diagnosis not present

## 2017-12-27 DIAGNOSIS — M1712 Unilateral primary osteoarthritis, left knee: Secondary | ICD-10-CM

## 2017-12-27 DIAGNOSIS — Z79891 Long term (current) use of opiate analgesic: Secondary | ICD-10-CM | POA: Insufficient documentation

## 2017-12-27 DIAGNOSIS — G8929 Other chronic pain: Secondary | ICD-10-CM | POA: Diagnosis not present

## 2017-12-27 DIAGNOSIS — Z9841 Cataract extraction status, right eye: Secondary | ICD-10-CM | POA: Insufficient documentation

## 2017-12-27 DIAGNOSIS — M1711 Unilateral primary osteoarthritis, right knee: Secondary | ICD-10-CM

## 2017-12-27 DIAGNOSIS — M51369 Other intervertebral disc degeneration, lumbar region without mention of lumbar back pain or lower extremity pain: Secondary | ICD-10-CM

## 2017-12-27 MED ORDER — SODIUM HYALURONATE (VISCOSUP) 20 MG/2ML IX SOSY
2.0000 mL | PREFILLED_SYRINGE | Freq: Once | INTRA_ARTICULAR | Status: AC
  Administered 2017-12-27: 2 mL via INTRA_ARTICULAR
  Filled 2017-12-27: qty 2

## 2017-12-27 MED ORDER — LIDOCAINE HCL (PF) 1 % IJ SOLN
4.0000 mL | Freq: Once | INTRAMUSCULAR | Status: AC
  Administered 2017-12-27: 5 mL
  Filled 2017-12-27: qty 5

## 2017-12-27 MED ORDER — ROPIVACAINE HCL 2 MG/ML IJ SOLN
4.0000 mL | Freq: Once | INTRAMUSCULAR | Status: AC
  Administered 2017-12-27: 10 mL via INTRA_ARTICULAR
  Filled 2017-12-27: qty 10

## 2017-12-27 NOTE — Progress Notes (Signed)
Safety precautions to be maintained throughout the outpatient stay will include: orient to surroundings, keep bed in low position, maintain call bell within reach at all times, provide assistance with transfer out of bed and ambulation.  

## 2017-12-27 NOTE — Progress Notes (Signed)
Patient's Name: James Friedman  MRN: [id number]  Referring Provider: Delano MetzNaveira, Madelene Kaatz, MD  DOB: 12-23-1945  PCP: Bobbye Mortoneilly, Sharon A, MD  DOS: 12/27/2017  Note by: Oswaldo DoneFrancisco A Gean Laursen, MD  Service setting: Ambulatory outpatient  Specialty: Interventional Pain Management  Patient type: Established  Location: [facility] (AMB) Pain Management Facility  Visit type: Interventional Procedure   Primary Reason for Visit: Interventional Pain Management Treatment. CC: Knee Pain (bilateral) and Back Pain (lower)  Procedure:  Anesthesia, Analgesia, Anxiolysis:  Type: Therapeutic Intra-Articular Hyalgan Knee Injection Right knee #2 and left knee #1  Region: Lateral infrapatellar Knee Region Level: Knee Joint Laterality: Bilateral  Type: Local Anesthesia Indication(s): Analgesia         Local Anesthetic: Lidocaine 1-2% Route: Infiltration ([place]/IM) IV Access: Declined Sedation: Declined    Indications: 1. Osteoarthritis of knee (Bilateral)   2. Primary osteoarthritis of right knee   3. Primary osteoarthritis of left knee   4. Chronic knee pain (Bilateral)    Pain Score: Pre-procedure: 8 /10 Post-procedure: 0-No pain/10  Pre-op Assessment:  James Friedman is a [age] y.o. (year old), male patient, seen today for interventional treatment. He  has a past surgical history that includes Knee surgery (Bilateral); Neck surgery; Carpal tunnel release; Shoulder surgery (Right); Cataract extraction, extracapsular (Left, 08/04/2015); and Cataract extraction w/PHACO (Right, 04/19/2016). James Friedman has a current medication list which includes the following prescription(s): acetaminophen, albuterol, aluminum hydroxide, amlodipine, antiseptic oral rinse, atorvastatin, capsaicin, cetirizine, chlorpheniramine, clotrimazole, diclofenac sodium, docusate sodium, duloxetine, fluticasone, gabapentin, glucagon, hydroxyzine, insulin aspart, insulin detemir, lisinopril, magnesium, methocarbamol, metoprolol tartrate, mometasone,  multivitamin, naloxone, naproxen, omeprazole, oxycodone, oxygen-helium, pantoprazole, polyethylene glycol powder, promethazine, sitagliptin-metformin, testosterone cypionate, trazodone, vitamin c, and vitamin a & d. His primarily concern today is the Knee Pain (bilateral) and Back Pain (lower)  The patient comes in today indicating that he recently had a fall due to the fact that his left knee gave out on him.  He presents with scraping of both knees and he also complains of recurrence of his right lower extremity pain and right lower back pain.  He wants to have the right lower side of his back injected next.  Initial Vital Signs:  Pulse Rate: 88 Temp: 98 F (36.7 C) Resp: 18 BP: 135/86 SpO2: 100 %  BMI: Estimated body mass index is 42.83 kg/m as calculated from the following:   Height as of this encounter: 5\' 9"  (1.753 m).   Weight as of this encounter: 290 lb (131.5 kg).  Risk Assessment: Allergies: Reviewed. He has No Known Allergies.  Allergy Precautions: None required Coagulopathies: Reviewed. None identified.  Blood-thinner therapy: None at this time Active Infection(s): Reviewed. None identified. James Friedman is afebrile  Site Confirmation: James Friedman was asked to confirm the procedure and laterality before marking the site Procedure checklist: Completed Consent: Before the procedure and under the influence of no sedative(s), amnesic(s), or anxiolytics, the patient was informed of the treatment options, risks and possible complications. To fulfill our ethical and legal obligations, as recommended by the American Medical Association's Code of Ethics, I have informed the patient of my clinical impression; the nature and purpose of the treatment or procedure; the risks, benefits, and possible complications of the intervention; the alternatives, including doing nothing; the risk(s) and benefit(s) of the alternative treatment(s) or procedure(s); and the risk(s) and benefit(s) of doing  nothing. The patient was provided information about the general risks and possible complications associated with the procedure. These may include, but are not limited to:  failure to achieve desired goals, infection, bleeding, organ or nerve damage, allergic reactions, paralysis, and death. In addition, the patient was informed of those risks and complications associated to the procedure, such as failure to decrease pain; infection; bleeding; organ or nerve damage with subsequent damage to sensory, motor, and/or autonomic systems, resulting in permanent pain, numbness, and/or weakness of one or several areas of the body; allergic reactions; (i.e.: anaphylactic reaction); and/or death. Furthermore, the patient was informed of those risks and complications associated with the medications. These include, but are not limited to: allergic reactions (i.e.: anaphylactic or anaphylactoid reaction(s)); adrenal axis suppression; blood sugar elevation that in diabetics may result in ketoacidosis or comma; water retention that in patients with history of congestive heart failure may result in shortness of breath, pulmonary edema, and decompensation with resultant heart failure; weight gain; swelling or edema; medication-induced neural toxicity; particulate matter embolism and blood vessel occlusion with resultant organ, and/or nervous system infarction; and/or aseptic necrosis of one or more joints. Finally, the patient was informed that Medicine is not an exact science; therefore, there is also the possibility of unforeseen or unpredictable risks and/or possible complications that may result in a catastrophic outcome. The patient indicated having understood very clearly. We have given the patient no guarantees and we have made no promises. Enough time was given to the patient to ask questions, all of which were answered to the patient's satisfaction. [name] has indicated that he wanted to continue with the  procedure. Attestation: I, the ordering provider, attest that I have discussed with the patient the benefits, risks, side-effects, alternatives, likelihood of achieving goals, and potential problems during recovery for the procedure that I have provided informed consent. Date  Time:   Pre-Procedure Preparation:  Monitoring: As per clinic protocol. Respiration, ETCO2, SpO2, BP, heart rate and rhythm monitor placed and checked for adequate function Safety Precautions: Patient was assessed for positional comfort and pressure points before starting the procedure. Time-out: I initiated and conducted the "Time-out" before starting the procedure, as per protocol. The patient was asked to participate by confirming the accuracy of the "Time Out" information. Verification of the correct person, site, and procedure were performed and confirmed by me, the nursing staff, and the patient. "Time-out" conducted as per Joint Commission's Universal Protocol (UP.01.01.01). Time: 0835  Description of Procedure:       Position: Sitting Target Area: Knee Joint Approach: Just above the Lateral tibial plateau, lateral to the infrapatellar tendon. Area Prepped: Entire knee area, from the mid-thigh to the mid-shin. Prepping solution: ChloraPrep (2% chlorhexidine gluconate and 70% isopropyl alcohol) Safety Precautions: Aspiration looking for blood return was conducted prior to all injections. At no point did we inject any substances, as a needle was being advanced. No attempts were made at seeking any paresthesias. Safe injection practices and needle disposal techniques used. Medications properly checked for expiration dates. SDV (single dose vial) medications used. Description of the Procedure: Protocol guidelines were followed. The patient was placed in position over the fluoroscopy table. The target area was identified and the area prepped in the usual manner. Skin desensitized using vapocoolant spray. Skin & deeper  tissues infiltrated with local anesthetic. Appropriate amount of time allowed to pass for local anesthetics to take effect. The procedure needles were then advanced to the target area. Proper needle placement secured. Negative aspiration confirmed. Solution injected in intermittent fashion, asking for systemic symptoms every 0.5cc of injectate. The needles were then removed and the area cleansed, making sure to  leave some of the prepping solution back to take advantage of its long term bactericidal properties. Vitals:   12/27/17 0816 12/27/17 0840  BP: 135/86 (!) 160/90  Pulse: 88 86  Resp: 18 18  Temp: 98 F (36.7 C)   TempSrc: Oral   SpO2: 100% 99%  Weight: 290 lb (131.5 kg)   Height: 5\' 9"  (1.753 m)     Start Time: 0836 hrs. End Time: 0839 hrs. Materials:  Needle(s) Type: Regular needle Gauge: 25G Length: 1.5-in Medication(s): Please see orders for medications and dosing details.  Imaging Guidance:  Type of Imaging Technique: None used Indication(s): N/A Exposure Time: No patient exposure Contrast: None used. Fluoroscopic Guidance: N/A Ultrasound Guidance: N/A Interpretation: N/A  Antibiotic Prophylaxis:   Anti-infectives (From admission, onward)   None     Indication(s): None identified  Post-operative Assessment:  Post-procedure Vital Signs:  Pulse Rate: 86 Temp: 98 F (36.7 C) Resp: 18 BP: (!) 160/90 SpO2: 99 %  EBL: None  Complications: No immediate post-treatment complications observed by team, or reported by patient.  Note: The patient tolerated the entire procedure well. A repeat set of vitals were taken after the procedure and the patient was kept under observation following institutional policy, for this type of procedure. Post-procedural neurological assessment was performed, showing return to baseline, prior to discharge. The patient was provided with post-procedure discharge instructions, including a section on how to identify potential problems.  Should any problems arise concerning this procedure, the patient was given instructions to immediately contact us, at any time, without hesitation. In any case, we plan to contact the patient by telephone for a follow-up status report regarding this interventional procedure.  Comments:  No additional relevant information.  Plan of Care   Imaging Orders  No imaging studies ordered today    Procedure Orders     KNEE INJECTION     KNEE INJECTION     Lumbar Epidural Injection  Medications ordered for procedure: Meds ordered this encounter  Medications  . lidocaine (PF) (XYLOCAINE) 1 % injection 4 mL  . ropivacaine (PF) 2 mg/mL (0.2%) (NAROPIN) injection 4 mL  . Sodium Hyaluronate SOSY 2 mL  . Sodium Hyaluronate SOSY 2 mL   Medications administered: We administered lidocaine (PF), ropivacaine (PF) 2 mg/mL (0.2%), Sodium Hyaluronate, and Sodium Hyaluronate.  See the medical record for exact dosing, route, and time of administration.  New Prescriptions   No medications on file   Disposition: Discharge home  Discharge Date & Time: 12/27/2017; 0850 hrs.   Physician-requested Follow-up: Return for PPE (2 wks) + Procedure (no sedation): (B) IA Hyalgan + (R) L2-3 LESI.  No future appointments. Primary Care Physician: Bobbye Morton, MD Location: [facility] Note by: Oswaldo Done, MD Date: 12/27/2017; Time: 10:05 AM  Disclaimer:  Medicine is not an exact science. The only guarantee in medicine is that nothing is guaranteed. It is important to note that the decision to proceed with this intervention was based on the information collected from the patient. The Data and conclusions were drawn from the patient's questionnaire, the interview, and the physical examination. Because the information was provided in large part by the patient, it cannot be guaranteed that it has not been purposely or unconsciously manipulated. Every effort has been made to obtain  as much relevant data as possible for this evaluation. It is important to note that the conclusions that lead to this procedure are derived in large part from the available data. Always  take into account that the treatment will also be dependent on availability of resources and existing treatment guidelines, considered by other Pain Management Practitioners as being common knowledge and practice, at the time of the intervention. For Medico-Legal purposes, it is also important to point out that variation in procedural techniques and pharmacological choices are the acceptable norm. The indications, contraindications, technique, and results of the above procedure should only be interpreted and judged by a Board-Certified Interventional Pain Specialist with extensive familiarity and expertise in the same exact procedure and technique.

## 2017-12-27 NOTE — Patient Instructions (Addendum)
____________________________________________________________________________________________  Post-Procedure Discharge Instructions  Instructions:  Apply ice: Fill a plastic sandwich bag with crushed ice. Cover it with a small towel and apply to injection site. Apply for 15 minutes then remove x 15 minutes. Repeat sequence on day of procedure, until you go to bed. The purpose is to minimize swelling and discomfort after procedure.  Apply heat: Apply heat to procedure site starting the day following the procedure. The purpose is to treat any soreness and discomfort from the procedure.  Food intake: Start with clear liquids (like water) and advance to regular food, as tolerated.   Physical activities: Keep activities to a minimum for the first 8 hours after the procedure.   Driving: If you have received any sedation, you are not allowed to drive for 24 hours after your procedure.  Blood thinner: Restart your blood thinner 6 hours after your procedure. (Only for those taking blood thinners)  Insulin: As soon as you can eat, you may resume your normal dosing schedule. (Only for those taking insulin)  Infection prevention: Keep procedure site clean and dry.  Post-procedure Pain Diary: Extremely important that this be done correctly and accurately. Recorded information will be used to determine the next step in treatment.  Pain evaluated is that of treated area only. Do not include pain from an untreated area.  Complete every hour, on the hour, for the initial 8 hours. Set an alarm to help you do this part accurately.  Do not go to sleep and have it completed later. It will not be accurate.  Follow-up appointment: Keep your follow-up appointment after the procedure. Usually 2 weeks for most procedures. (6 weeks in the case of radiofrequency.) Bring you pain diary.   Expect:  From numbing medicine (AKA: Local Anesthetics): Numbness or decrease in pain.  Onset: Full effect within 15  minutes of injected.  Duration: It will depend on the type of local anesthetic used. On the average, 1 to 8 hours.   From steroids: Decrease in swelling or inflammation. Once inflammation is improved, relief of the pain will follow.  Onset of benefits: Depends on the amount of swelling present. The more swelling, the longer it will take for the benefits to be seen. In some cases, up to 10 days.  Duration: Steroids will stay in the system x 2 weeks. Duration of benefits will depend on multiple posibilities including persistent irritating factors.  From procedure: Some discomfort is to be expected once the numbing medicine wears off. This should be minimal if ice and heat are applied as instructed.  Call if:  You experience numbness and weakness that gets worse with time, as opposed to wearing off.  New onset bowel or bladder incontinence. (This applies to Spinal procedures only)  Emergency Numbers:  Durning business hours (Monday - Thursday, 8:00 AM - 4:00 PM) (Friday, 9:00 AM - 12:00 Noon): [phone]  After hours: [phone] ____________________________________________________________________________________________   ____________________________________________________________________________________________  Preparing for your procedure (without sedation)  Instructions: . Oral Intake: Do not eat or drink anything for at least 3 hours prior to your procedure. . Transportation: Unless otherwise stated by your physician, you may drive yourself after the procedure. . Blood Pressure Medicine: Take your blood pressure medicine with a sip of water the morning of the procedure. . Blood thinners:  . Diabetics on insulin: Notify the staff so that you can be scheduled 1st case in the morning. If your diabetes requires high dose insulin, take only  of your normal insulin dose   the morning of the procedure and notify the staff that you have done so. . Preventing infections: Shower  with an antibacterial soap the morning of your procedure.  . Build-up your immune system: Take 1000 mg of Vitamin C with every meal (3 times a day) the day prior to your procedure. Marland Kitchen. Antibiotics: Inform the staff if you have a condition or reason that requires you to take antibiotics before dental procedures. . Pregnancy: If you are pregnant, call and cancel the procedure. . Sickness: If you have a cold, fever, or any active infections, call and cancel the procedure. . Arrival: You must be in the facility at least 30 minutes prior to your scheduled procedure. . Children: Do not bring any children with you. . Dress appropriately: Bring dark clothing that you would not mind if they get stained. . Valuables: Do not bring any jewelry or valuables.  Procedure appointments are reserved for interventional treatments only. Marland Kitchen. No Prescription Refills. . No medication changes will be discussed during procedure appointments. . No disability issues will be discussed.  Remember:  Regular Business hours are:  Monday to Thursday 8:00 AM to 4:00 PM  Provider's Schedule: Delano MetzFrancisco Dorsie Sethi, MD:  Procedure days: Tuesday and Thursday 7:30 AM to 4:00 PM  Edward JollyBilal Lateef, MD:  Procedure days: Monday and Wednesday 7:30 AM to 4:00 PM ____________________________________________________________________________________________   ____________________________________________________________________________________________  Pain Scale  Introduction: The pain score used by this practice is the Verbal Numerical Rating Scale (VNRS-11). This is an 11-point scale. It is for adults and children 10 years or older. There are significant differences in how the pain score is reported, used, and applied. Forget everything you learned in the past and learn this scoring system.  General Information: The scale should reflect your current level of pain. Unless you are specifically asked for the level of your worst pain, or your  average pain. If you are asked for one of these two, then it should be understood that it is over the past 24 hours.  Basic Activities of Daily Living (ADL): Personal hygiene, dressing, eating, transferring, and using restroom.  Instructions: Most patients tend to report their level of pain as a combination of two factors, their physical pain and their psychosocial pain. This last one is also known as "suffering" and it is reflection of how physical pain affects you socially and psychologically. From now on, report them separately. From this point on, when asked to report your pain level, report only your physical pain. Use the following table for reference.  Pain Clinic Pain Levels (0-5/10)  Pain Level Score  Description  No Pain 0   Mild pain 1 Nagging, annoying, but does not interfere with basic activities of daily living (ADL). Patients are able to eat, bathe, get dressed, toileting (being able to get on and off the toilet and perform personal hygiene functions), transfer (move in and out of bed or a chair without assistance), and maintain continence (able to control bladder and bowel functions). Blood pressure and heart rate are unaffected. A normal heart rate for a healthy adult ranges from 60 to 100 bpm (beats per minute).   Mild to moderate pain 2 Noticeable and distracting. Impossible to hide from other people. More frequent flare-ups. Still possible to adapt and function close to normal. It can be very annoying and may have occasional stronger flare-ups. With discipline, patients may get used to it and adapt.   Moderate pain 3 Interferes significantly with activities of daily living (ADL). It becomes  difficult to feed, bathe, get dressed, get on and off the toilet or to perform personal hygiene functions. Difficult to get in and out of bed or a chair without assistance. Very distracting. With effort, it can be ignored when deeply involved in activities.   Moderately severe pain 4 Impossible  to ignore for more than a few minutes. With effort, patients may still be able to manage work or participate in some social activities. Very difficult to concentrate. Signs of autonomic nervous system discharge are evident: dilated pupils (mydriasis); mild sweating (diaphoresis); sleep interference. Heart rate becomes elevated (>115 bpm). Diastolic blood pressure (lower number) rises above 100 mmHg. Patients find relief in laying down and not moving.   Severe pain 5 Intense and extremely unpleasant. Associated with frowning face and frequent crying. Pain overwhelms the senses.  Ability to do any activity or maintain social relationships becomes significantly limited. Conversation becomes difficult. Pacing back and forth is common, as getting into a comfortable position is nearly impossible. Pain wakes you up from deep sleep. Physical signs will be obvious: pupillary dilation; increased sweating; goosebumps; brisk reflexes; cold, clammy hands and feet; nausea, vomiting or dry heaves; loss of appetite; significant sleep disturbance with inability to fall asleep or to remain asleep. When persistent, significant weight loss is observed due to the complete loss of appetite and sleep deprivation.  Blood pressure and heart rate becomes significantly elevated. Caution: If elevated blood pressure triggers a pounding headache associated with blurred vision, then the patient should immediately seek attention at an urgent or emergency care unit, as these may be signs of an impending stroke.    Emergency Department Pain Levels (6-10/10)  Emergency Room Pain 6 Severely limiting. Requires emergency care and should not be seen or managed at an outpatient pain management facility. Communication becomes difficult and requires great effort. Assistance to reach the emergency department may be required. Facial flushing and profuse sweating along with potentially dangerous increases in heart rate and blood pressure will be  evident.   Distressing pain 7 Self-care is very difficult. Assistance is required to transport, or use restroom. Assistance to reach the emergency department will be required. Tasks requiring coordination, such as bathing and getting dressed become very difficult.   Disabling pain 8 Self-care is no longer possible. At this level, pain is disabling. The individual is unable to do even the most "basic" activities such as walking, eating, bathing, dressing, transferring to a bed, or toileting. Fine motor skills are lost. It is difficult to think clearly.   Incapacitating pain 9 Pain becomes incapacitating. Thought processing is no longer possible. Difficult to remember your own name. Control of movement and coordination are lost.   The worst pain imaginable 10 At this level, most patients pass out from pain. When this level is reached, collapse of the autonomic nervous system occurs, leading to a sudden drop in blood pressure and heart rate. This in turn results in a temporary and dramatic drop in blood flow to the brain, leading to a loss of consciousness. Fainting is one of the body's self defense mechanisms. Passing out puts the brain in a calmed state and causes it to shut down for a while, in order to begin the healing process.    Summary: 1. Refer to this scale when providing [place] with your pain level. 2. Be accurate and careful when reporting your pain level. This will help with your care. 3. Over-reporting your pain level will lead to loss of credibility. 4. Even a  level of 1/10 means that there is pain and will be treated at our facility. 5. High, inaccurate reporting will be documented as "Symptom Exaggeration", leading to loss of credibility and suspicions of possible secondary gains such as obtaining more narcotics, or wanting to appear disabled, for fraudulent reasons. 6. Only pain levels of 5 or below will be seen at our facility. 7. Pain levels of 6 and above will be sent to the Emergency  Department and the appointment cancelled. ____________________________________________________________________________________________   ____________________________________________________________________________________________  Preparing for your procedure (without sedation)  Instructions: . Oral Intake: Do not eat or drink anything for at least 3 hours prior to your procedure. . Transportation: Unless otherwise stated by your physician, you may drive yourself after the procedure. . Blood Pressure Medicine: Take your blood pressure medicine with a sip of water the morning of the procedure. . Blood thinners:  . Diabetics on insulin: Notify the staff so that you can be scheduled 1st case in the morning. If your diabetes requires high dose insulin, take only  of your normal insulin dose the morning of the procedure and notify the staff that you have done so. . Preventing infections: Shower with an antibacterial soap the morning of your procedure.  . Build-up your immune system: Take 1000 mg of Vitamin C with every meal (3 times a day) the day prior to your procedure. Marland Kitchen Antibiotics: Inform the staff if you have a condition or reason that requires you to take antibiotics before dental procedures. . Pregnancy: If you are pregnant, call and cancel the procedure. . Sickness: If you have a cold, fever, or any active infections, call and cancel the procedure. . Arrival: You must be in the facility at least 30 minutes prior to your scheduled procedure. . Children: Do not bring any children with you. . Dress appropriately: Bring dark clothing that you would not mind if they get stained. . Valuables: Do not bring any jewelry or valuables.  Procedure appointments are reserved for interventional treatments only. Marland Kitchen No Prescription Refills. . No medication changes will be discussed during procedure appointments. . No disability issues will be discussed.  Remember:  Regular Business hours are:   Monday to Thursday 8:00 AM to 4:00 PM  Provider's Schedule: Delano Metz, MD:  Procedure days: Tuesday and Thursday 7:30 AM to 4:00 PM  Edward Jolly, MD:  Procedure days: Monday and Wednesday 7:30 AM to 4:00 PM ____________________________________________________________________________________________

## 2017-12-28 ENCOUNTER — Telehealth: Payer: Self-pay

## 2017-12-28 NOTE — Telephone Encounter (Signed)
, °

## 2017-12-28 NOTE — Telephone Encounter (Signed)
Denies any needs at this time. Instructed to call if needed. 

## 2018-01-09 NOTE — Progress Notes (Signed)
Patient's Name: James Friedman  MRN: [id number]  Referring Provider: Delano Metz, MD  DOB: 1945-12-30  PCP: Bobbye Morton, MD  DOS: 01/10/2018  Note by: Oswaldo Done, MD  Service setting: Ambulatory outpatient  Specialty: Interventional Pain Management  Patient type: Established  Location: [facility] (AMB) Pain Management Facility  Visit type: Interventional Procedure   Primary Reason for Visit: Interventional Pain Management Treatment. CC: Back Pain (lower) and Knee Pain (bilateral)  Procedure #1:  Anesthesia, Analgesia, Anxiolysis:  Type: Therapeutic Intra-Articular Hyalgan Knee Injection Right knee #3 and left knee #2  Region: Lateral infrapatellar Knee Region Level: Knee Joint Laterality: Bilateral  Type: Local Anesthesia Indication(s): Analgesia         Local Anesthetic: Lidocaine 1-2% Route: Infiltration ([place]/IM) IV Access: Declined Sedation: Declined    Indications: 1. Osteoarthritis of knee (Bilateral)   2. Chronic knee pain (Bilateral)    Procedure #2:  Anesthesia, Analgesia, Anxiolysis:  Type: Therapeutic Inter-Laminar Epidural Steroid Injection #3  Region: Lumbar Level: L2-3 Level. Laterality: Right-Sided Paramedial  Type: Local Anesthesia Indication(s): Analgesia         Route: Infiltration ([place]/IM) IV Access: Declined Sedation: Declined  Local Anesthetic: Lidocaine 1-2%   Indications: 1. DDD (degenerative disc disease), lumbar   2. 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 Chronic lumbar radicular pain (Bilateral) (R>L) (S1 Dermatome on the left)   4. Chronic low back pain (Primary Source of Pain) (Bilateral) (L>R)    Pain Score: Pre-procedure: 6 (knees-8)/10 Post-procedure: 6 (knees-8)/10  Pre-op Assessment:  James Friedman is a 72 y.o. (year old), male patient, seen today for interventional treatment. He  has a past surgical history that includes Knee surgery (Bilateral); Neck surgery; Carpal tunnel release; Shoulder surgery  (Right); Cataract extraction, extracapsular (Left, 08/04/2015); and Cataract extraction w/PHACO (Right, 04/19/2016). James Friedman has a current medication list which includes the following prescription(s): acetaminophen, albuterol, aluminum hydroxide, amlodipine, antiseptic oral rinse, atorvastatin, capsaicin, cetirizine, chlorpheniramine, clotrimazole, diclofenac sodium, docusate sodium, duloxetine, fluticasone, gabapentin, glucagon, hydroxyzine, insulin aspart, insulin detemir, lisinopril, magnesium, methocarbamol, metoprolol tartrate, mometasone, multivitamin, naloxone, naproxen, omeprazole, oxycodone, oxygen-helium, pantoprazole, polyethylene glycol powder, promethazine, sitagliptin-metformin, testosterone cypionate, trazodone, vitamin c, and vitamin a & d, and the following Facility-Administered Medications: fentanyl, lactated ringers, and midazolam. His primarily concern today is the Back Pain (lower) and Knee Pain (bilateral)  Initial Vital Signs:  Pulse/HCG Rate: 88ECG Heart Rate: 83 Temp: 98 F (36.7 C) Resp: 18 BP: (!) 144/71 SpO2: 98 %  BMI: Estimated body mass index is 43.12 kg/m as calculated from the following:   Height as of this encounter: 5\' 9"  (1.753 m).   Weight as of this encounter: 292 lb (132.5 kg).  Risk Assessment: Allergies: Reviewed. He has No Known Allergies.  Allergy Precautions: None required Coagulopathies: Reviewed. None identified.  Blood-thinner therapy: None at this time Active Infection(s): Reviewed. None identified. James Friedman is afebrile  Site Confirmation: James Friedman was asked to confirm the procedure and laterality before marking the site Procedure checklist: Completed Consent: Before the procedure and under the influence of no sedative(s), amnesic(s), or anxiolytics, the patient was informed of the treatment options, risks and possible complications. To fulfill our ethical and legal obligations, as recommended by the American Medical Association's Code  of Ethics, I have informed the patient of my clinical impression; the nature and purpose of the treatment or procedure; the risks, benefits, and possible complications of the intervention; the alternatives, including doing nothing; the risk(s) and benefit(s) of the alternative treatment(s)  or procedure(s); and the risk(s) and benefit(s) of doing nothing. The patient was provided information about the general risks and possible complications associated with the procedure. These may include, but are not limited to: failure to achieve desired goals, infection, bleeding, organ or nerve damage, allergic reactions, paralysis, and death. In addition, the patient was informed of those risks and complications associated to the procedure, such as failure to decrease pain; infection; bleeding; organ or nerve damage with subsequent damage to sensory, motor, and/or autonomic systems, resulting in permanent pain, numbness, and/or weakness of one or several areas of the body; allergic reactions; (i.e.: anaphylactic reaction); and/or death. Furthermore, the patient was informed of those risks and complications associated with the medications. These include, but are not limited to: allergic reactions (i.e.: anaphylactic or anaphylactoid reaction(s)); adrenal axis suppression; blood sugar elevation that in diabetics may result in ketoacidosis or comma; water retention that in patients with history of congestive heart failure may result in shortness of breath, pulmonary edema, and decompensation with resultant heart failure; weight gain; swelling or edema; medication-induced neural toxicity; particulate matter embolism and blood vessel occlusion with resultant organ, and/or nervous system infarction; and/or aseptic necrosis of one or more joints. Finally, the patient was informed that Medicine is not an exact science; therefore, there is also the possibility of unforeseen or unpredictable risks and/or possible complications that  may result in a catastrophic outcome. The patient indicated having understood very clearly. We have given the patient no guarantees and we have made no promises. Enough time was given to the patient to ask questions, all of which were answered to the patient's satisfaction. Mr. Prehn has indicated that he wanted to continue with the procedure. Attestation: I, the ordering provider, attest that I have discussed with the patient the benefits, risks, side-effects, alternatives, likelihood of achieving goals, and potential problems during recovery for the procedure that I have provided informed consent. Date  Time: 01/10/2018  8:27 AM  Pre-Procedure Preparation:  Monitoring: As per clinic protocol. Respiration, ETCO2, SpO2, BP, heart rate and rhythm monitor placed and checked for adequate function Safety Precautions: Patient was assessed for positional comfort and pressure points before starting the procedure. Time-out: I initiated and conducted the "Time-out" before starting the procedure, as per protocol. The patient was asked to participate by confirming the accuracy of the "Time Out" information. Verification of the correct person, site, and procedure were performed and confirmed by me, the nursing staff, and the patient. "Time-out" conducted as per Joint Commission's Universal Protocol (UP.01.01.01). Time: 1610(9604 for LESI)  Description of Procedure #1:  Position: Sitting Target Area: Knee Joint Approach: Just above the Lateral tibial plateau, lateral to the infrapatellar tendon. Area Prepped: Entire knee area, from the mid-thigh to the mid-shin. Prepping solution: ChloraPrep (2% chlorhexidine gluconate and 70% isopropyl alcohol) Safety Precautions: Aspiration looking for blood return was conducted prior to all injections. At no point did we inject any substances, as a needle was being advanced. No attempts were made at seeking any paresthesias. Safe injection practices and needle disposal  techniques used. Medications properly checked for expiration dates. SDV (single dose vial) medications used. Description of the Procedure: Protocol guidelines were followed. The patient was placed in position over the fluoroscopy table. The target area was identified and the area prepped in the usual manner. Skin desensitized using vapocoolant spray. Skin & deeper tissues infiltrated with local anesthetic. Appropriate amount of time allowed to pass for local anesthetics to take effect. The procedure needles were then advanced to  the target area. Proper needle placement secured. Negative aspiration confirmed. Solution injected in intermittent fashion, asking for systemic symptoms every 0.5cc of injectate. The needles were then removed and the area cleansed, making sure to leave some of the prepping solution back to take advantage of its long term bactericidal properties. Vitals:   01/10/18 0920 01/10/18 0925 01/10/18 0930 01/10/18 0933  BP: 113/90 (!) 125/99 (!) 145/104 (!) 111/93  Pulse:      Resp: 20 12 13 15   Temp:      TempSrc:      SpO2: 96% 97% 96% 95%  Weight:      Height:        Start Time: 5409(8119 for LESI) hrs. End Time: 1478(2956 for LESI) hrs. Materials:  Needle(s) Type: Regular needle Gauge: 25G Length: 1.5-in Medication(s): Please see orders for medications and dosing details.  Imaging Guidance for procedure #1:  Type of Imaging Technique: None used Indication(s): N/A Exposure Time: No patient exposure Contrast: None used. Fluoroscopic Guidance: N/A Ultrasound Guidance: N/A Interpretation: N/A  Description of Procedure #2:  Position: Prone with head of the table was raised to facilitate breathing. Target Area: The interlaminar space, initially targeting the lower laminar border of the superior vertebral body. Approach: Paramedial approach. Area Prepped: Entire Posterior Lumbar Region Prepping solution: ChloraPrep (2% chlorhexidine gluconate and 70% isopropyl  alcohol) Safety Precautions: Aspiration looking for blood return was conducted prior to all injections. At no point did we inject any substances, as a needle was being advanced. No attempts were made at seeking any paresthesias. Safe injection practices and needle disposal techniques used. Medications properly checked for expiration dates. SDV (single dose vial) medications used. Description of the Procedure: Protocol guidelines were followed. The procedure needle was introduced through the skin, ipsilateral to the reported pain, and advanced to the target area. Bone was contacted and the needle walked caudad, until the lamina was cleared. The epidural space was identified using "loss-of-resistance technique" with 2-3 ml of PF-NaCl (0.9% NSS), in a 5cc LOR glass syringe. Vitals:   01/10/18 0920 01/10/18 0925 01/10/18 0930 01/10/18 0933  BP: 113/90 (!) 125/99 (!) 145/104 (!) 111/93  Pulse:      Resp: 20 12 13 15   Temp:      TempSrc:      SpO2: 96% 97% 96% 95%  Weight:      Height:        Start Time: 2130(8657 for LESI) hrs. End Time: 8469(6295 for LESI) hrs. Materials:  Needle(s) Type: Epidural needle Gauge: 17G Length: 3.5-in Medication(s): Please see orders for medications and dosing details.  Imaging Guidance for procedure #2 (Spinal):  Type of Imaging Technique: Fluoroscopy Guidance (Spinal) Indication(s): Assistance in needle guidance and placement for procedures requiring needle placement in or near specific anatomical locations not easily accessible without such assistance. Exposure Time: Please see nurses notes. Contrast: Before injecting any contrast, we confirmed that the patient did not have an allergy to iodine, shellfish, or radiological contrast. Once satisfactory needle placement was completed at the desired level, radiological contrast was injected. Contrast injected under live fluoroscopy. No contrast complications. See chart for type and volume of contrast  used. Fluoroscopic Guidance: I was personally present during the use of fluoroscopy. "Tunnel Vision Technique" used to obtain the best possible view of the target area. Parallax error corrected before commencing the procedure. "Direction-depth-direction" technique used to introduce the needle under continuous pulsed fluoroscopy. Once target was reached, antero-posterior, oblique, and lateral fluoroscopic projection used confirm needle placement in  all planes. Images permanently stored in EMR. Interpretation: I personally interpreted the imaging intraoperatively. Adequate needle placement confirmed in multiple planes. Appropriate spread of contrast into desired area was observed. No evidence of afferent or efferent intravascular uptake. No intrathecal or subarachnoid spread observed. Permanent images saved into the patient's record.  Antibiotic Prophylaxis:   Anti-infectives (From admission, onward)   None     Indication(s): None identified  Post-operative Assessment:  Post-procedure Vital Signs:  Pulse/HCG Rate: 8888 Temp: 98 F (36.7 C) Resp: 15 BP: (!) 111/93 SpO2: 95 %  EBL: None  Complications: No immediate post-treatment complications observed by team, or reported by patient.  Note: The patient tolerated the entire procedure well. A repeat set of vitals were taken after the procedure and the patient was kept under observation following institutional policy, for this type of procedure. Post-procedural neurological assessment was performed, showing return to baseline, prior to discharge. The patient was provided with post-procedure discharge instructions, including a section on how to identify potential problems. Should any problems arise concerning this procedure, the patient was given instructions to immediately contact us, at any time, without hesitation. In any case, we plan to contact the patient by telephone for a follow-up status report regarding this interventional  procedure.  Comments:  No additional relevant information.  Plan of Care    Imaging Orders     DG C-Arm 1-60 Min-No Report  Procedure Orders     Lumbar Epidural Injection     KNEE INJECTION     KNEE INJECTION  Medications ordered for procedure: Meds ordered this encounter  Medications  . iopamidol (ISOVUE-M) 41 % intrathecal injection 10 mL    Must be Myelogram-compatible. If not available, you may substitute with a water-soluble, non-ionic, hypoallergenic, myelogram-compatible radiological contrast medium.  Marland Kitchen. lidocaine (XYLOCAINE) 2 % (with pres) injection 400 mg  . midazolam (VERSED) 5 MG/5ML injection 1-2 mg    Make sure Flumazenil is available in the pyxis when using this medication. If oversedation occurs, administer 0.2 mg IV over 15 sec. If after 45 sec no response, administer 0.2 mg again over 1 min; may repeat at 1 min intervals; not to exceed 4 doses (1 mg)  . fentaNYL (SUBLIMAZE) injection 25-50 mcg    Make sure Narcan is available in the pyxis when using this medication. In the event of respiratory depression (RR< 8/min): Titrate NARCAN (naloxone) in increments of 0.1 to 0.2 mg IV at 2-3 minute intervals, until desired degree of reversal.  . lactated ringers infusion 1,000 mL  . sodium chloride flush (NS) 0.9 % injection 2 mL  . ropivacaine (PF) 2 mg/mL (0.2%) (NAROPIN) injection 2 mL  . triamcinolone acetonide (KENALOG-40) injection 40 mg  . lidocaine (PF) (XYLOCAINE) 1 % injection 4 mL  . ropivacaine (PF) 2 mg/mL (0.2%) (NAROPIN) injection 4 mL  . Sodium Hyaluronate SOSY 2 mL  . Sodium Hyaluronate SOSY 2 mL   Medications administered: We administered iopamidol, lidocaine, sodium chloride flush, ropivacaine (PF) 2 mg/mL (0.2%), triamcinolone acetonide, lidocaine (PF), ropivacaine (PF) 2 mg/mL (0.2%), Sodium Hyaluronate, and Sodium Hyaluronate.  See the medical record for exact dosing, route, and time of administration.  New Prescriptions   No medications on file    Disposition: Discharge home  Discharge Date & Time: 01/10/2018; 0945 hrs.   Physician-requested Follow-up: Return for PPE (2 wks) + Procedure (no sedation): (B) IA Hyalgan Knee inj.  No future appointments. Primary Care Physician: Bobbye Mortoneilly, Sharon A, MD [facility] Note by:  Oswaldo Done, MD Date: 01/10/2018; Time: 9:47 AM  Disclaimer:  Medicine is not an Visual merchandiser. The only guarantee in medicine is that nothing is guaranteed. It is important to note that the decision to proceed with this intervention was based on the information collected from the patient. The Data and conclusions were drawn from the patient's questionnaire, the interview, and the physical examination. Because the information was provided in large part by the patient, it cannot be guaranteed that it has not been purposely or unconsciously manipulated. Every effort has been made to obtain as much relevant data as possible for this evaluation. It is important to note that the conclusions that lead to this procedure are derived in large part from the available data. Always take into account that the treatment will also be dependent on availability of resources and existing treatment guidelines, considered by other Pain Management Practitioners as being common knowledge and practice, at the time of the intervention. For Medico-Legal purposes, it is also important to point out that variation in procedural techniques and pharmacological choices are the acceptable norm. The indications, contraindications, technique, and results of the above procedure should only be interpreted and judged by a Board-Certified Interventional Pain Specialist with extensive familiarity and expertise in the same exact procedure and technique.

## 2018-01-10 ENCOUNTER — Ambulatory Visit
Admission: RE | Admit: 2018-01-10 | Discharge: 2018-01-10 | Disposition: A | Discharge status: Home / Self Care | Payer: Medicare (Managed Care) | Source: Ambulatory Visit | Attending: Pain Medicine | Admitting: Pain Medicine

## 2018-01-10 ENCOUNTER — Ambulatory Visit (HOSPITAL_BASED_OUTPATIENT_CLINIC_OR_DEPARTMENT_OTHER): Payer: Medicare (Managed Care) | Admitting: Pain Medicine

## 2018-01-10 ENCOUNTER — Encounter: Payer: Self-pay | Admitting: Pain Medicine

## 2018-01-10 ENCOUNTER — Other Ambulatory Visit: Payer: Self-pay

## 2018-01-10 VITALS — BP 111/93 | HR 88 | Temp 98.0°F | Resp 15 | Ht 69.0 in | Wt 292.0 lb

## 2018-01-10 DIAGNOSIS — M170 Bilateral primary osteoarthritis of knee: Secondary | ICD-10-CM

## 2018-01-10 DIAGNOSIS — M5116 Intervertebral disc disorders with radiculopathy, lumbar region: Secondary | ICD-10-CM | POA: Insufficient documentation

## 2018-01-10 DIAGNOSIS — G8929 Other chronic pain: Secondary | ICD-10-CM

## 2018-01-10 DIAGNOSIS — Z79899 Other long term (current) drug therapy: Secondary | ICD-10-CM | POA: Diagnosis not present

## 2018-01-10 DIAGNOSIS — Z79891 Long term (current) use of opiate analgesic: Secondary | ICD-10-CM | POA: Insufficient documentation

## 2018-01-10 DIAGNOSIS — M25562 Pain in left knee: Secondary | ICD-10-CM | POA: Diagnosis not present

## 2018-01-10 DIAGNOSIS — Z794 Long term (current) use of insulin: Secondary | ICD-10-CM | POA: Insufficient documentation

## 2018-01-10 DIAGNOSIS — M48061 Spinal stenosis, lumbar region without neurogenic claudication: Secondary | ICD-10-CM

## 2018-01-10 DIAGNOSIS — M5136 Other intervertebral disc degeneration, lumbar region: Secondary | ICD-10-CM

## 2018-01-10 DIAGNOSIS — M5416 Radiculopathy, lumbar region: Secondary | ICD-10-CM

## 2018-01-10 DIAGNOSIS — Z9841 Cataract extraction status, right eye: Secondary | ICD-10-CM | POA: Diagnosis not present

## 2018-01-10 DIAGNOSIS — M25561 Pain in right knee: Secondary | ICD-10-CM

## 2018-01-10 DIAGNOSIS — M545 Low back pain: Secondary | ICD-10-CM | POA: Diagnosis present

## 2018-01-10 DIAGNOSIS — Z7951 Long term (current) use of inhaled steroids: Secondary | ICD-10-CM | POA: Diagnosis not present

## 2018-01-10 DIAGNOSIS — Z9842 Cataract extraction status, left eye: Secondary | ICD-10-CM | POA: Diagnosis not present

## 2018-01-10 DIAGNOSIS — M48062 Spinal stenosis, lumbar region with neurogenic claudication: Secondary | ICD-10-CM

## 2018-01-10 MED ORDER — ROPIVACAINE HCL 2 MG/ML IJ SOLN
2.0000 mL | Freq: Once | INTRAMUSCULAR | Status: AC
  Administered 2018-01-10: 10 mL via EPIDURAL
  Filled 2018-01-10: qty 10

## 2018-01-10 MED ORDER — LIDOCAINE HCL (PF) 1 % IJ SOLN
4.0000 mL | Freq: Once | INTRAMUSCULAR | Status: AC
  Administered 2018-01-10: 4 mL
  Filled 2018-01-10: qty 5

## 2018-01-10 MED ORDER — LACTATED RINGERS IV SOLN: 1000.0000 mL | Freq: Once | INTRAVENOUS | Status: DC

## 2018-01-10 MED ORDER — SODIUM CHLORIDE 0.9% FLUSH
2.0000 mL | Freq: Once | INTRAVENOUS | Status: AC
  Administered 2018-01-10: 2 mL

## 2018-01-10 MED ORDER — FENTANYL CITRATE (PF) 100 MCG/2ML IJ SOLN: 25.0000 ug | INTRAMUSCULAR | Status: DC | PRN

## 2018-01-10 MED ORDER — LIDOCAINE HCL 2 % IJ SOLN
20.0000 mL | Freq: Once | INTRAMUSCULAR | Status: AC
  Administered 2018-01-10: 400 mg
  Filled 2018-01-10: qty 40

## 2018-01-10 MED ORDER — TRIAMCINOLONE ACETONIDE 40 MG/ML IJ SUSP
40.0000 mg | Freq: Once | INTRAMUSCULAR | Status: AC
  Administered 2018-01-10: 40 mg
  Filled 2018-01-10: qty 1

## 2018-01-10 MED ORDER — SODIUM HYALURONATE (VISCOSUP) 20 MG/2ML IX SOSY
2.0000 mL | PREFILLED_SYRINGE | Freq: Once | INTRA_ARTICULAR | Status: AC
  Administered 2018-01-10: 2 mL via INTRA_ARTICULAR

## 2018-01-10 MED ORDER — IOPAMIDOL (ISOVUE-M 200) INJECTION 41%
10.0000 mL | Freq: Once | INTRAMUSCULAR | Status: AC
  Administered 2018-01-10: 10 mL via EPIDURAL
  Filled 2018-01-10: qty 10

## 2018-01-10 MED ORDER — ROPIVACAINE HCL 2 MG/ML IJ SOLN
4.0000 mL | Freq: Once | INTRAMUSCULAR | Status: AC
  Administered 2018-01-10: 10 mL via INTRA_ARTICULAR
  Filled 2018-01-10: qty 10

## 2018-01-10 MED ORDER — MIDAZOLAM HCL 5 MG/5ML IJ SOLN: 1.0000 mg | INTRAMUSCULAR | Status: DC | PRN

## 2018-01-10 MED ORDER — SODIUM HYALURONATE (VISCOSUP) 20 MG/2ML IX SOSY
2.0000 mL | PREFILLED_SYRINGE | Freq: Once | INTRA_ARTICULAR | Status: AC
  Administered 2018-01-10: 09:00:00 via INTRA_ARTICULAR

## 2018-01-10 NOTE — Patient Instructions (Addendum)
____________________________________________________________________________________________  Post-Procedure Discharge Instructions  Instructions:  Apply ice: Fill a plastic sandwich bag with crushed ice. Cover it with a small towel and apply to injection site. Apply for 15 minutes then remove x 15 minutes. Repeat sequence on day of procedure, until you go to bed. The purpose is to minimize swelling and discomfort after procedure.  Apply heat: Apply heat to procedure site starting the day following the procedure. The purpose is to treat any soreness and discomfort from the procedure.  Food intake: Start with clear liquids (like water) and advance to regular food, as tolerated.   Physical activities: Keep activities to a minimum for the first 8 hours after the procedure.   Driving: If you have received any sedation, you are not allowed to drive for 24 hours after your procedure.  Blood thinner: Restart your blood thinner 6 hours after your procedure. (Only for those taking blood thinners)  Insulin: As soon as you can eat, you may resume your normal dosing schedule. (Only for those taking insulin)  Infection prevention: Keep procedure site clean and dry.  Post-procedure Pain Diary: Extremely important that this be done correctly and accurately. Recorded information will be used to determine the next step in treatment.  Pain evaluated is that of treated area only. Do not include pain from an untreated area.  Complete every hour, on the hour, for the initial 8 hours. Set an alarm to help you do this part accurately.  Do not go to sleep and have it completed later. It will not be accurate.  Follow-up appointment: Keep your follow-up appointment after the procedure. Usually 2 weeks for most procedures. (6 weeks in the case of radiofrequency.) Bring you pain diary.   Expect:  From numbing medicine (AKA: Local Anesthetics): Numbness or decrease in pain.  Onset: Full effect within 15  minutes of injected.  Duration: It will depend on the type of local anesthetic used. On the average, 1 to 8 hours.   From steroids: Decrease in swelling or inflammation. Once inflammation is improved, relief of the pain will follow.  Onset of benefits: Depends on the amount of swelling present. The more swelling, the longer it will take for the benefits to be seen. In some cases, up to 10 days.  Duration: Steroids will stay in the system x 2 weeks. Duration of benefits will depend on multiple posibilities including persistent irritating factors.  From procedure: Some discomfort is to be expected once the numbing medicine wears off. This should be minimal if ice and heat are applied as instructed.  Call if:  You experience numbness and weakness that gets worse with time, as opposed to wearing off.  New onset bowel or bladder incontinence. (This applies to Spinal procedures only)  Emergency Numbers:  Durning business hours (Monday - Thursday, 8:00 AM - 4:00 PM) (Friday, 9:00 AM - 12:00 Noon): [phone]  After hours: [phone] ____________________________________________________________________________________________   Epidural Steroid Injection Patient Information  Description: The epidural space surrounds the nerves as they exit the spinal cord.  In some patients, the nerves can be compressed and inflamed by a bulging disc or a tight spinal canal (spinal stenosis).  By injecting steroids into the epidural space, we can bring irritated nerves into direct contact with a potentially helpful medication.  These steroids act directly on the irritated nerves and can reduce swelling and inflammation which often leads to decreased pain.  Epidural steroids may be injected anywhere along the spine and from the neck to the  low back depending upon the location of your pain.   After numbing the skin with local anesthetic (like Novocaine), a small needle is passed into the epidural space  slowly.  You may experience a sensation of pressure while this is being done.  The entire block usually last less than 10 minutes.  Conditions which may be treated by epidural steroids:   Low back and leg pain  Neck and arm pain  Spinal stenosis  Post-laminectomy syndrome  Herpes zoster (shingles) pain  Pain from compression fractures  Preparation for the injection:  1. Do not eat any solid food or dairy products within 8 hours of your appointment.  2. You may drink clear liquids up to 3 hours before appointment.  Clear liquids include water, black coffee, juice or soda.  No milk or cream please. 3. You may take your regular medication, including pain medications, with a sip of water before your appointment  Diabetics should hold regular insulin (if taken separately) and take 1/2 normal NPH dos the morning of the procedure.  Carry some sugar containing items with you to your appointment. 4. A driver must accompany you and be prepared to drive you home after your procedure.  5. Bring all your current medications with your. 6. An IV may be inserted and sedation may be given at the discretion of the physician.   7. A blood pressure cuff, EKG and other monitors will often be applied during the procedure.  Some patients may need to have extra oxygen administered for a short period. 8. You will be asked to provide medical information, including your allergies, prior to the procedure.  We must know immediately if you are taking blood thinners (like Coumadin/Warfarin)  Or if you are allergic to IV iodine contrast (dye). We must know if you could possible be pregnant.  Possible side-effects:  Bleeding from needle site  Infection (rare, may require surgery)  Nerve injury (rare)  Numbness & tingling (temporary)  Difficulty urinating (rare, temporary)  Spinal headache ( a headache worse with upright posture)  Light -headedness (temporary)  Pain at injection site (several  days)  Decreased blood pressure (temporary)  Weakness in arm/leg (temporary)  Pressure sensation in back/neck (temporary)  Call if you experience:  Fever/chills associated with headache or increased back/neck pain.  Headache worsened by an upright position.  New onset weakness or numbness of an extremity below the injection site  Hives or difficulty breathing (go to the emergency room)  Inflammation or drainage at the infection site  Severe back/neck pain  Any new symptoms which are concerning to you  Please note:  Although the local anesthetic injected can often make your back or neck feel good for several hours after the injection, the pain will likely return.  It takes 3-7 days for steroids to work in the epidural space.  You may not notice any pain relief for at least that one week.  If effective, we will often do a series of three injections spaced 3-6 weeks apart to maximally decrease your pain.  After the initial series, we generally will wait several months before considering a repeat injection of the same type.  If you have any questions, please call [phone] [facility] Dis[facility] Inst[facility]  General Discharge Instructions :  If you need to reach your doctor call: Monday-Friday 8:00 am - 4:00 pm at [phone] or toll free [phone].  After clinic hours [phone] to have operator reach doctor.  Bring all   of your medication bottles to all your appointments in the pain clinic.  To cancel or reschedule your appointment with Pain Management please remember to call 24 hours in advance to avoid a fee.  Refer to the educational materials which you have been given on: General Risks, I had my Procedure. Discharge Instructions, Post Sedation.  Post Procedure Instructions:  The drugs you were given will stay in your system until tomorrow, so for the next 24 hours you should not drive, make any legal decisions  or drink any alcoholic beverages.  You may eat anything you prefer, but it is better to start with liquids then soups and crackers, and gradually work up to solid foods.  Please notify your doctor immediately if you have any unusual bleeding, trouble breathing or pain that is not related to your normal pain.  Depending on the type of procedure that was done, some parts of your body may feel week and/or numb.  This usually clears up by tonight or the next day.  Walk with the use of an assistive device or accompanied by an adult for the 24 hours.  You may use ice on the affected area for the first 24 hours.  Put ice in a Ziploc bag and cover with a towel and place against area 15 minutes on 15 minutes off.  You may switch to heat after 24 hours. 

## 2018-01-11 ENCOUNTER — Telehealth: Payer: Self-pay

## 2018-01-11 NOTE — Telephone Encounter (Signed)
Post procedure phone call.  Patient states he is doing good.  

## 2018-03-02 ENCOUNTER — Encounter: Payer: Self-pay | Admitting: Pain Medicine

## 2018-03-02 ENCOUNTER — Ambulatory Visit: Payer: Medicare (Managed Care) | Attending: Pain Medicine | Admitting: Pain Medicine

## 2018-03-02 ENCOUNTER — Other Ambulatory Visit: Payer: Self-pay

## 2018-03-02 VITALS — BP 105/74 | HR 83 | Temp 98.9°F | Resp 20 | Ht 69.0 in | Wt 282.0 lb

## 2018-03-02 DIAGNOSIS — M25561 Pain in right knee: Secondary | ICD-10-CM | POA: Diagnosis present

## 2018-03-02 DIAGNOSIS — Z79899 Other long term (current) drug therapy: Secondary | ICD-10-CM | POA: Diagnosis not present

## 2018-03-02 DIAGNOSIS — Z794 Long term (current) use of insulin: Secondary | ICD-10-CM | POA: Diagnosis not present

## 2018-03-02 DIAGNOSIS — M25562 Pain in left knee: Secondary | ICD-10-CM | POA: Diagnosis present

## 2018-03-02 DIAGNOSIS — M7051 Other bursitis of knee, right knee: Secondary | ICD-10-CM | POA: Insufficient documentation

## 2018-03-02 DIAGNOSIS — M5136 Other intervertebral disc degeneration, lumbar region: Secondary | ICD-10-CM | POA: Insufficient documentation

## 2018-03-02 DIAGNOSIS — Z79891 Long term (current) use of opiate analgesic: Secondary | ICD-10-CM | POA: Diagnosis not present

## 2018-03-02 DIAGNOSIS — Z9889 Other specified postprocedural states: Secondary | ICD-10-CM | POA: Insufficient documentation

## 2018-03-02 DIAGNOSIS — G8929 Other chronic pain: Secondary | ICD-10-CM

## 2018-03-02 DIAGNOSIS — Z7901 Long term (current) use of anticoagulants: Secondary | ICD-10-CM | POA: Insufficient documentation

## 2018-03-02 DIAGNOSIS — M170 Bilateral primary osteoarthritis of knee: Secondary | ICD-10-CM | POA: Diagnosis not present

## 2018-03-02 MED ORDER — SODIUM HYALURONATE (VISCOSUP) 20 MG/2ML IX SOSY
2.0000 mL | PREFILLED_SYRINGE | Freq: Once | INTRA_ARTICULAR | Status: AC
  Administered 2018-03-02: 2 mL via INTRA_ARTICULAR

## 2018-03-02 MED ORDER — ROPIVACAINE HCL 2 MG/ML IJ SOLN
4.0000 mL | Freq: Once | INTRAMUSCULAR | Status: AC
  Administered 2018-03-02: 10 mL via INTRA_ARTICULAR

## 2018-03-02 MED ORDER — LIDOCAINE HCL (PF) 1 % IJ SOLN
4.0000 mL | Freq: Once | INTRAMUSCULAR | Status: AC
  Administered 2018-03-02: 5 mL

## 2018-03-02 MED ORDER — LIDOCAINE HCL (PF) 1 % IJ SOLN
INTRAMUSCULAR | Status: AC
  Filled 2018-03-02: qty 5

## 2018-03-02 MED ORDER — ROPIVACAINE HCL 2 MG/ML IJ SOLN
INTRAMUSCULAR | Status: AC
  Filled 2018-03-02: qty 10

## 2018-03-02 NOTE — Patient Instructions (Addendum)
Preparing for your procedure (without sedation) Instructions: . Oral Intake: Do not eat or drink anything for at least 3 hours prior to your procedure. . Transportation: Unless otherwise stated by your physician, you may drive yourself after the procedure. . Blood Pressure Medicine: Take your blood pressure medicine with a sip of water the morning of the procedure. . Insulin: Take only  of your normal insulin dose. . Preventing infections: Shower with an antibacterial soap the morning of your procedure. . Build-up your immune system: Take 1000 mg of Vitamin C with every meal (3 times a day) the day prior to your procedure. . Pregnancy: If you are pregnant, call and cancel the procedure. . Sickness: If you have a cold, fever, or any active infections, call and cancel the procedure. . Arrival: You must be in the facility at least 30 minutes prior to your scheduled procedure. . Children: Do not bring any children with you. . Dress appropriately: Bring dark clothing that you would not mind if they get stained. . Valuables: Do not bring any jewelry or valuables. Procedure appointments are reserved for interventional treatments only. Marland Kitchen. No Prescription Refills. . No medication changes will be discussed during procedure appointments. . No disability issues will be discussed.  ____________________________________________________________________________________________  Post-Procedure Discharge Instructions  Instructions:  Apply ice: Fill a plastic sandwich bag with crushed ice. Cover it with a small towel and apply to injection site. Apply for 15 minutes then remove x 15 minutes. Repeat sequence on day of procedure, until you go to bed. The purpose is to minimize swelling and discomfort after procedure.  Apply heat: Apply heat to procedure site starting the day following the procedure. The purpose is to treat any soreness and discomfort from the procedure.  Food intake: Start with clear liquids  (like water) and advance to regular food, as tolerated.   Physical activities: Keep activities to a minimum for the first 8 hours after the procedure.   Driving: If you have received any sedation, you are not allowed to drive for 24 hours after your procedure.  Blood thinner: Restart your blood thinner 6 hours after your procedure. (Only for those taking blood thinners)  Insulin: As soon as you can eat, you may resume your normal dosing schedule. (Only for those taking insulin)  Infection prevention: Keep procedure site clean and dry.  Post-procedure Pain Diary: Extremely important that this be done correctly and accurately. Recorded information will be used to determine the next step in treatment.  Pain evaluated is that of treated area only. Do not include pain from an untreated area.  Complete every hour, on the hour, for the initial 8 hours. Set an alarm to help you do this part accurately.  Do not go to sleep and have it completed later. It will not be accurate.  Follow-up appointment: Keep your follow-up appointment after the procedure. Usually 2 weeks for most procedures. (6 weeks in the case of radiofrequency.) Bring you pain diary.   Expect:  From numbing medicine (AKA: Local Anesthetics): Numbness or decrease in pain.  Onset: Full effect within 15 minutes of injected.  Duration: It will depend on the type of local anesthetic used. On the average, 1 to 8 hours.   From steroids: Decrease in swelling or inflammation. Once inflammation is improved, relief of the pain will follow.  Onset of benefits: Depends on the amount of swelling present. The more swelling, the longer it will take for the benefits to be seen. In some cases, up to  10 days.  Duration: Steroids will stay in the system x 2 weeks. Duration of benefits will depend on multiple posibilities including persistent irritating factors.  From procedure: Some discomfort is to be expected once the numbing medicine wears  off. This should be minimal if ice and heat are applied as instructed.  Call if:  You experience numbness and weakness that gets worse with time, as opposed to wearing off.  New onset bowel or bladder incontinence. (This applies to Spinal procedures only)  Emergency Numbers:  Durning business hours (Monday - Thursday, 8:00 AM - 4:00 PM) (Friday, 9:00 AM - 12:00 Noon): [phone]  After hours: [phone] ____________________________________________________________________________________________   ____________________________________________________________________________________________  Preparing for your procedure (without sedation)  Instructions: . Oral Intake: Do not eat or drink anything for at least 3 hours prior to your procedure. . Transportation: Unless otherwise stated by your physician, you may drive yourself after the procedure. . Blood Pressure Medicine: Take your blood pressure medicine with a sip of water the morning of the procedure. . Blood thinners:  . Diabetics on insulin: Notify the staff so that you can be scheduled 1st case in the morning. If your diabetes requires high dose insulin, take only  of your normal insulin dose the morning of the procedure and notify the staff that you have done so. . Preventing infections: Shower with an antibacterial soap the morning of your procedure.  . Build-up your immune system: Take 1000 mg of Vitamin C with every meal (3 times a day) the day prior to your procedure. Marland Kitchen Antibiotics: Inform the staff if you have a condition or reason that requires you to take antibiotics before dental procedures. . Pregnancy: If you are pregnant, call and cancel the procedure. . Sickness: If you have a cold, fever, or any active infections, call and cancel the procedure. . Arrival: You must be in the facility at least 30 minutes prior to your scheduled procedure. . Children: Do not bring any children with you. . Dress appropriately:  Bring dark clothing that you would not mind if they get stained. . Valuables: Do not bring any jewelry or valuables.  Procedure appointments are reserved for interventional treatments only. Marland Kitchen No Prescription Refills. . No medication changes will be discussed during procedure appointments. . No disability issues will be discussed.  Remember:  Regular Business hours are:  Monday to Thursday 8:00 AM to 4:00 PM  Provider's Schedule: Delano Metz, MD:  Procedure days: Tuesday and Thursday 7:30 AM to 4:00 PM  Edward Jolly, MD:  Procedure days: Monday and Wednesday 7:30 AM to 4:00 PM ____________________________________________________________________________________________

## 2018-03-02 NOTE — Progress Notes (Signed)
Safety precautions to be maintained throughout the outpatient stay will include: orient to surroundings, keep bed in low position, maintain call bell within reach at all times, provide assistance with transfer out of bed and ambulation.  

## 2018-03-02 NOTE — Progress Notes (Signed)
Patient's Name: James Friedman  MRN: [id number]  Referring Provider: Bobbye Mortoneilly, Sharon A, MD  DOB: 11/08/1945  PCP: Bobbye Mortoneilly, Sharon A, MD  DOS: 03/02/2018  Note by: Oswaldo DoneFrancisco A Cherisa Brucker, MD  Service setting: Ambulatory outpatient  Specialty: Interventional Pain Management  Patient type: Established  Location: [facility] (AMB) Pain Management Facility  Visit type: Interventional Procedure   Primary Reason for Visit: Interventional Pain Management Treatment. CC: Knee Pain  Procedure:  Anesthesia, Analgesia, Anxiolysis:  Type: Therapeutic Intra-Articular Hyalgan Knee Injection Right knee #4 and left knee #3  Region: Lateral infrapatellar Knee Region Level: Knee Joint Laterality: Bilateral  Type: Local Anesthesia Indication(s): Analgesia         Local Anesthetic: Lidocaine 1-2% Route: Infiltration ([place]/IM) IV Access: Declined Sedation: Declined    Indications: 1. Osteoarthritis of knee (Bilateral)   2. Chronic knee pain (Bilateral)   3. Suprapatellar bursitis of right knee   4. DDD (degenerative disc disease), lumbar    Pain Score: Pre-procedure: 5 /10 Post-procedure: 0-No pain(left knee, #2 in the right knee)/10  Pre-op Assessment:  [name] is a 72 y.o. (year old), male patient, seen today for interventional treatment. He  has a past surgical history that includes Knee surgery (Bilateral); Neck surgery; Carpal tunnel release; Shoulder surgery (Right); Cataract extraction, extracapsular (Left, 08/04/2015); and Cataract extraction w/PHACO (Right, 04/19/2016). [name] has a current medication list which includes the following prescription(s): acetaminophen, albuterol, aluminum hydroxide, amlodipine, antiseptic oral rinse, atorvastatin, capsaicin, cetirizine, chlorpheniramine, clotrimazole, diclofenac sodium, docusate sodium, duloxetine, fluticasone, gabapentin, glucagon, hydroxyzine, insulin aspart, insulin detemir, lisinopril, magnesium, methocarbamol, metoprolol tartrate, mometasone,  multivitamin, naloxone, naproxen, omeprazole, oxycodone, oxygen-helium, pantoprazole, polyethylene glycol powder, promethazine, sitagliptin-metformin, testosterone cypionate, trazodone, vitamin c, and vitamin a & d. His primarily concern today is the Knee Pain  The patient indicates doing well with them intra-articular Hyalgan knee injections. However, he is being limited by his insurance company to only 1 visit per month, which is throwing arrange into the normal. To be weighted between intra-articular Hyalgan knee injection. In addition, the patient indicates that his low back pain and right thigh pain is beginning to come back. In the past we have done a right-sided L2-3 interlaminar lumbar epidural steroid injections with great success. He needs to continue in his quest of losing weight. His target, based on pain management studies is a BMI of 30. At this point his current BMI is 41.64.  Initial Vital Signs:  Pulse/HCG Rate: 83ECG Heart Rate: 81 Temp: 98.9 F (37.2 C) Resp: 16 BP: (!) 127/100(patient is going to take blood pressure medicine right now) SpO2: 98 %  BMI: Estimated body mass index is 41.64 kg/m as calculated from the following:   Height as of this encounter: 5\' 9"  (1.753 m).   Weight as of this encounter: 282 lb (127.9 kg).  Risk Assessment: Allergies: Reviewed. He has No Known Allergies.  Allergy Precautions: None required Coagulopathies: Reviewed. None identified.  Blood-thinner therapy: None at this time Active Infection(s): Reviewed. None identified. [name] is afebrile  Site Confirmation: [name] was asked to confirm the procedure and laterality before marking the site Procedure checklist: Completed Consent: Before the procedure and under the influence of no sedative(s), amnesic(s), or anxiolytics, the patient was informed of the treatment options, risks and possible complications. To fulfill our ethical and legal obligations, as recommended by the American  Medical Association's Code of Ethics, I have informed the patient of my clinical impression; the nature and purpose of the treatment or procedure; the risks,  benefits, and possible complications of the intervention; the alternatives, including doing nothing; the risk(s) and benefit(s) of the alternative treatment(s) or procedure(s); and the risk(s) and benefit(s) of doing nothing. The patient was provided information about the general risks and possible complications associated with the procedure. These may include, but are not limited to: failure to achieve desired goals, infection, bleeding, organ or nerve damage, allergic reactions, paralysis, and death. In addition, the patient was informed of those risks and complications associated to the procedure, such as failure to decrease pain; infection; bleeding; organ or nerve damage with subsequent damage to sensory, motor, and/or autonomic systems, resulting in permanent pain, numbness, and/or weakness of one or several areas of the body; allergic reactions; (i.e.: anaphylactic reaction); and/or death. Furthermore, the patient was informed of those risks and complications associated with the medications. These include, but are not limited to: allergic reactions (i.e.: anaphylactic or anaphylactoid reaction(s)); adrenal axis suppression; blood sugar elevation that in diabetics may result in ketoacidosis or comma; water retention that in patients with history of congestive heart failure may result in shortness of breath, pulmonary edema, and decompensation with resultant heart failure; weight gain; swelling or edema; medication-induced neural toxicity; particulate matter embolism and blood vessel occlusion with resultant organ, and/or nervous system infarction; and/or aseptic necrosis of one or more joints. Finally, the patient was informed that Medicine is not an exact science; therefore, there is also the possibility of unforeseen or unpredictable risks and/or  possible complications that may result in a catastrophic outcome. The patient indicated having understood very clearly. We have given the patient no guarantees and we have made no promises. Enough time was given to the patient to ask questions, all of which were answered to the patient's satisfaction. Mr. Moring has indicated that he wanted to continue with the procedure. Attestation: I, the ordering provider, attest that I have discussed with the patient the benefits, risks, side-effects, alternatives, likelihood of achieving goals, and potential problems during recovery for the procedure that I have provided informed consent. Date  Time: 03/02/2018  9:38 AM  Pre-Procedure Preparation:  Monitoring: As per clinic protocol. Respiration, ETCO2, SpO2, BP, heart rate and rhythm monitor placed and checked for adequate function Safety Precautions: Patient was assessed for positional comfort and pressure points before starting the procedure. Time-out: I initiated and conducted the "Time-out" before starting the procedure, as per protocol. The patient was asked to participate by confirming the accuracy of the "Time Out" information. Verification of the correct person, site, and procedure were performed and confirmed by me, the nursing staff, and the patient. "Time-out" conducted as per Joint Commission's Universal Protocol (UP.01.01.01). Time: 0953  Description of Procedure:       Position: Sitting Target Area: Knee Joint Approach: Just above the Lateral tibial plateau, lateral to the infrapatellar tendon. Area Prepped: Entire knee area, from the mid-thigh to the mid-shin. Prepping solution: ChloraPrep (2% chlorhexidine gluconate and 70% isopropyl alcohol) Safety Precautions: Aspiration looking for blood return was conducted prior to all injections. At no point did we inject any substances, as a needle was being advanced. No attempts were made at seeking any paresthesias. Safe injection practices and needle  disposal techniques used. Medications properly checked for expiration dates. SDV (single dose vial) medications used. Description of the Procedure: Protocol guidelines were followed. The patient was placed in position over the fluoroscopy table. The target area was identified and the area prepped in the usual manner. Skin desensitized using vapocoolant spray. Skin & deeper tissues infiltrated with  local anesthetic. Appropriate amount of time allowed to pass for local anesthetics to take effect. The procedure needles were then advanced to the target area. Proper needle placement secured. Negative aspiration confirmed. Solution injected in intermittent fashion, asking for systemic symptoms every 0.5cc of injectate. The needles were then removed and the area cleansed, making sure to leave some of the prepping solution back to take advantage of its long term bactericidal properties. Vitals:   03/02/18 0935 03/02/18 0957  BP: (!) 127/100 105/74  Pulse: 83   Resp: 16 20  Temp: 98.9 F (37.2 C)   TempSrc: Oral   SpO2: 98% 96%  Weight: 282 lb (127.9 kg)   Height: 5\' 9"  (1.753 m)     Start Time: 0953 hrs. End Time: 0955 hrs. Materials:  Needle(s) Type: Regular needle Gauge: 22G Length: 3.5-in Medication(s): Please see orders for medications and dosing details.  Imaging Guidance:  Type of Imaging Technique: None used Indication(s): N/A Exposure Time: No patient exposure Contrast: None used. Fluoroscopic Guidance: N/A Ultrasound Guidance: N/A Interpretation: N/A  Antibiotic Prophylaxis:   Anti-infectives (From admission, onward)   None     Indication(s): None identified  Post-operative Assessment:  Post-procedure Vital Signs:  Pulse/HCG Rate: 8381 Temp: 98.9 F (37.2 C) Resp: 20 BP: 105/74 SpO2: 96 %  EBL: None  Complications: No immediate post-treatment complications observed by team, or reported by patient.  Note: The patient tolerated the entire procedure well. A repeat  set of vitals were taken after the procedure and the patient was kept under observation following institutional policy, for this type of procedure. Post-procedural neurological assessment was performed, showing return to baseline, prior to discharge. The patient was provided with post-procedure discharge instructions, including a section on how to identify potential problems. Should any problems arise concerning this procedure, the patient was given instructions to immediately contact us, at any time, without hesitation. In any case, we plan to contact the patient by telephone for a follow-up status report regarding this interventional procedure.  Comments:  No additional relevant information.  Plan of Care   Imaging Orders  No imaging studies ordered today    Procedure Orders     KNEE INJECTION     KNEE INJECTION     Lumbar Epidural Injection  Medications ordered for procedure: Meds ordered this encounter  Medications  . lidocaine (PF) (XYLOCAINE) 1 % injection 4 mL  . ropivacaine (PF) 2 mg/mL (0.2%) (NAROPIN) injection 4 mL  . Sodium Hyaluronate SOSY 2 mL  . Sodium Hyaluronate SOSY 2 mL   Medications administered: We administered lidocaine (PF), ropivacaine (PF) 2 mg/mL (0.2%), Sodium Hyaluronate, and Sodium Hyaluronate.  See the medical record for exact dosing, route, and time of administration.  New Prescriptions   No medications on file   Disposition: Discharge home  Discharge Date & Time: 03/02/2018; 1000 hrs.   Physician-requested Follow-up: Return for PPE (2 wks) + Procedure (no sedation): (B) Hyalgan #4 & 5, (R) L2-3 LESI.  No future appointments. Primary Care Physician: Bobbye Morton, MD Location: [facility] Note by: Oswaldo Done, MD Date: 03/02/2018; Time: 10:06 AM  Disclaimer:  Medicine is not an Visual merchandiser. The only guarantee in medicine is that nothing is guaranteed. It is important to note that the decision to proceed  with this intervention was based on the information collected from the patient. The Data and conclusions were drawn from the patient's questionnaire, the interview, and the physical examination. Because the information was provided in  large part by the patient, it cannot be guaranteed that it has not been purposely or unconsciously manipulated. Every effort has been made to obtain as much relevant data as possible for this evaluation. It is important to note that the conclusions that lead to this procedure are derived in large part from the available data. Always take into account that the treatment will also be dependent on availability of resources and existing treatment guidelines, considered by other Pain Management Practitioners as being common knowledge and practice, at the time of the intervention. For Medico-Legal purposes, it is also important to point out that variation in procedural techniques and pharmacological choices are the acceptable norm. The indications, contraindications, technique, and results of the above procedure should only be interpreted and judged by a Board-Certified Interventional Pain Specialist with extensive familiarity and expertise in the same exact procedure and technique.

## 2018-03-03 ENCOUNTER — Telehealth: Payer: Self-pay

## 2018-03-03 NOTE — Telephone Encounter (Signed)
Left message on AM to call if needed. 

## 2018-03-20 ENCOUNTER — Telehealth: Payer: Self-pay | Admitting: Nurse Practitioner

## 2018-03-20 NOTE — Telephone Encounter (Signed)
Spoke with Dr Victory Dakiniley She would like to wait and talk with Dr Shauna HughNaveria on next week

## 2018-03-20 NOTE — Telephone Encounter (Signed)
DR. Lajuana MatteSharon Riley [phone] lvmail asking to speak with Dr. Laban EmperorNaveira. Sent msg to [place]. King as Dr. Laban EmperorNaveira is out of office this week

## 2018-03-30 NOTE — Telephone Encounter (Signed)
Did Dr. Laban EmperorNaveira ever call this phys. About [facility]. If not please have him talk with her (see original msg and C. King note.)

## 2018-04-06 ENCOUNTER — Ambulatory Visit (HOSPITAL_BASED_OUTPATIENT_CLINIC_OR_DEPARTMENT_OTHER): Payer: Medicare (Managed Care) | Admitting: Pain Medicine

## 2018-04-06 ENCOUNTER — Encounter: Payer: Self-pay | Admitting: Pain Medicine

## 2018-04-06 ENCOUNTER — Ambulatory Visit
Admission: RE | Admit: 2018-04-06 | Discharge: 2018-04-06 | Disposition: A | Discharge status: Home / Self Care | Payer: Medicare (Managed Care) | Source: Ambulatory Visit | Attending: Pain Medicine | Admitting: Pain Medicine

## 2018-04-06 VITALS — BP 132/88 | HR 84 | Temp 98.6°F | Resp 15 | Ht 69.0 in | Wt 282.0 lb

## 2018-04-06 DIAGNOSIS — M5126 Other intervertebral disc displacement, lumbar region: Secondary | ICD-10-CM | POA: Insufficient documentation

## 2018-04-06 DIAGNOSIS — G8929 Other chronic pain: Secondary | ICD-10-CM | POA: Diagnosis not present

## 2018-04-06 DIAGNOSIS — Z9841 Cataract extraction status, right eye: Secondary | ICD-10-CM | POA: Diagnosis not present

## 2018-04-06 DIAGNOSIS — M48061 Spinal stenosis, lumbar region without neurogenic claudication: Secondary | ICD-10-CM

## 2018-04-06 DIAGNOSIS — M170 Bilateral primary osteoarthritis of knee: Secondary | ICD-10-CM

## 2018-04-06 DIAGNOSIS — M1712 Unilateral primary osteoarthritis, left knee: Secondary | ICD-10-CM | POA: Diagnosis not present

## 2018-04-06 DIAGNOSIS — M48062 Spinal stenosis, lumbar region with neurogenic claudication: Secondary | ICD-10-CM | POA: Diagnosis not present

## 2018-04-06 DIAGNOSIS — M9983 Other biomechanical lesions of lumbar region: Secondary | ICD-10-CM | POA: Insufficient documentation

## 2018-04-06 DIAGNOSIS — M1711 Unilateral primary osteoarthritis, right knee: Secondary | ICD-10-CM | POA: Diagnosis not present

## 2018-04-06 DIAGNOSIS — M5136 Other intervertebral disc degeneration, lumbar region: Secondary | ICD-10-CM

## 2018-04-06 DIAGNOSIS — M549 Dorsalgia, unspecified: Secondary | ICD-10-CM | POA: Insufficient documentation

## 2018-04-06 DIAGNOSIS — M545 Low back pain: Secondary | ICD-10-CM | POA: Diagnosis not present

## 2018-04-06 DIAGNOSIS — Z794 Long term (current) use of insulin: Secondary | ICD-10-CM | POA: Insufficient documentation

## 2018-04-06 DIAGNOSIS — M25562 Pain in left knee: Secondary | ICD-10-CM | POA: Insufficient documentation

## 2018-04-06 DIAGNOSIS — Z79899 Other long term (current) drug therapy: Secondary | ICD-10-CM | POA: Diagnosis not present

## 2018-04-06 DIAGNOSIS — M5416 Radiculopathy, lumbar region: Secondary | ICD-10-CM

## 2018-04-06 DIAGNOSIS — R1030 Lower abdominal pain, unspecified: Secondary | ICD-10-CM | POA: Diagnosis present

## 2018-04-06 DIAGNOSIS — M25561 Pain in right knee: Secondary | ICD-10-CM | POA: Diagnosis not present

## 2018-04-06 DIAGNOSIS — M5417 Radiculopathy, lumbosacral region: Secondary | ICD-10-CM | POA: Insufficient documentation

## 2018-04-06 DIAGNOSIS — M51369 Other intervertebral disc degeneration, lumbar region without mention of lumbar back pain or lower extremity pain: Secondary | ICD-10-CM

## 2018-04-06 DIAGNOSIS — Z7984 Long term (current) use of oral hypoglycemic drugs: Secondary | ICD-10-CM | POA: Diagnosis not present

## 2018-04-06 MED ORDER — ROPIVACAINE HCL 2 MG/ML IJ SOLN
2.0000 mL | Freq: Once | INTRAMUSCULAR | Status: AC
  Administered 2018-04-06: 2 mL via EPIDURAL
  Filled 2018-04-06: qty 10

## 2018-04-06 MED ORDER — LIDOCAINE HCL (PF) 1 % IJ SOLN
4.0000 mL | Freq: Once | INTRAMUSCULAR | Status: AC
  Administered 2018-04-06: 4 mL
  Filled 2018-04-06: qty 5

## 2018-04-06 MED ORDER — SODIUM HYALURONATE (VISCOSUP) 20 MG/2ML IX SOSY
2.0000 mL | PREFILLED_SYRINGE | Freq: Once | INTRA_ARTICULAR | Status: AC
  Administered 2018-04-06: 2 mL via INTRA_ARTICULAR

## 2018-04-06 MED ORDER — TRIAMCINOLONE ACETONIDE 40 MG/ML IJ SUSP
40.0000 mg | Freq: Once | INTRAMUSCULAR | Status: AC
  Administered 2018-04-06: 40 mg
  Filled 2018-04-06: qty 1

## 2018-04-06 MED ORDER — LIDOCAINE HCL 2 % IJ SOLN
20.0000 mL | Freq: Once | INTRAMUSCULAR | Status: AC
  Administered 2018-04-06: 400 mg
  Filled 2018-04-06: qty 40

## 2018-04-06 MED ORDER — SODIUM CHLORIDE 0.9% FLUSH
2.0000 mL | Freq: Once | INTRAVENOUS | Status: AC
  Administered 2018-04-06: 2 mL

## 2018-04-06 MED ORDER — IOPAMIDOL (ISOVUE-M 200) INJECTION 41%
10.0000 mL | Freq: Once | INTRAMUSCULAR | Status: AC
  Administered 2018-04-06: 10 mL via EPIDURAL
  Filled 2018-04-06: qty 10

## 2018-04-06 MED ORDER — ROPIVACAINE HCL 2 MG/ML IJ SOLN
4.0000 mL | Freq: Once | INTRAMUSCULAR | Status: AC
  Administered 2018-04-06: 4 mL via INTRA_ARTICULAR
  Filled 2018-04-06: qty 10

## 2018-04-06 NOTE — Patient Instructions (Addendum)
____________________________________________________________________________________________  Post-Procedure Discharge Instructions  Instructions:  Apply ice: Fill a plastic sandwich bag with crushed ice. Cover it with a small towel and apply to injection site. Apply for 15 minutes then remove x 15 minutes. Repeat sequence on day of procedure, until you go to bed. The purpose is to minimize swelling and discomfort after procedure.  Apply heat: Apply heat to procedure site starting the day following the procedure. The purpose is to treat any soreness and discomfort from the procedure.  Food intake: Start with clear liquids (like water) and advance to regular food, as tolerated.   Physical activities: Keep activities to a minimum for the first 8 hours after the procedure.   Driving: If you have received any sedation, you are not allowed to drive for 24 hours after your procedure.  Blood thinner: Restart your blood thinner 6 hours after your procedure. (Only for those taking blood thinners)  Insulin: As soon as you can eat, you may resume your normal dosing schedule. (Only for those taking insulin)  Infection prevention: Keep procedure site clean and dry.  Post-procedure Pain Diary: Extremely important that this be done correctly and accurately. Recorded information will be used to determine the next step in treatment.  Pain evaluated is that of treated area only. Do not include pain from an untreated area.  Complete every hour, on the hour, for the initial 8 hours. Set an alarm to help you do this part accurately.  Do not go to sleep and have it completed later. It will not be accurate.  Follow-up appointment: Keep your follow-up appointment after the procedure. Usually 2 weeks for most procedures. (6 weeks in the case of radiofrequency.) Bring you pain diary.   Expect:  From numbing medicine (AKA: Local Anesthetics): Numbness or decrease in pain.  Onset: Full effect within 15  minutes of injected.  Duration: It will depend on the type of local anesthetic used. On the average, 1 to 8 hours.   From steroids: Decrease in swelling or inflammation. Once inflammation is improved, relief of the pain will follow.  Onset of benefits: Depends on the amount of swelling present. The more swelling, the longer it will take for the benefits to be seen. In some cases, up to 10 days.  Duration: Steroids will stay in the system x 2 weeks. Duration of benefits will depend on multiple posibilities including persistent irritating factors.  From procedure: Some discomfort is to be expected once the numbing medicine wears off. This should be minimal if ice and heat are applied as instructed.  Call if:  You experience numbness and weakness that gets worse with time, as opposed to wearing off.  New onset bowel or bladder incontinence. (This applies to Spinal procedures only)  Emergency Numbers:  Durning business hours (Monday - Thursday, 8:00 AM - 4:00 PM) (Friday, 9:00 AM - 12:00 Noon): [phone]  After hours: [phone] ____________________________________________________________________________________________   ____________________________________________________________________________________________  Preparing for your procedure (without sedation)  Instructions: . Oral Intake: Do not eat or drink anything for at least 3 hours prior to your procedure. . Transportation: Unless otherwise stated by your physician, you may drive yourself after the procedure. . Blood Pressure Medicine: Take your blood pressure medicine with a sip of water the morning of the procedure. . Blood thinners:  . Diabetics on insulin: Notify the staff so that you can be scheduled 1st case in the morning. If your diabetes requires high dose insulin, take only  of your normal insulin dose   the morning of the procedure and notify the staff that you have done so. . Preventing infections: Shower  with an antibacterial soap the morning of your procedure.  . Build-up your immune system: Take 1000 mg of Vitamin C with every meal (3 times a day) the day prior to your procedure. [place] Kitchen Antibiotics: Inform the staff if you have a condition or reason that requires you to take antibiotics before dental procedures. . Pregnancy: If you are pregnant, call and cancel the procedure. . Sickness: If you have a cold, fever, or any active infections, call and cancel the procedure. . Arrival: You must be in the facility at least 30 minutes prior to your scheduled procedure. . Children: Do not bring any children with you. . Dress appropriately: Bring dark clothing that you would not mind if they get stained. . Valuables: Do not bring any jewelry or valuables.  Procedure appointments are reserved for interventional treatments only. [place]land Kitchen No Prescription Refills. . No medication changes will be discussed during procedure appointments. . No disability issues will be discussed.  Remember:  Regular Business hours are:  Monday to Thursday 8:00 AM to 4:00 PM  Provider's Schedule: Milinda Pointer, MD:  Procedure days: Tuesday and Thursday 7:30 AM to 4:00 PM  Gillis Santa, MD:  Procedure days: Monday and Wednesday 7:30 AM to 4:00 PM ____________________________________________________________________________________________   ____________________________________________________________________________________________  Post-Procedure Discharge Instructions  Instructions:  Apply ice: Fill a plastic sandwich bag with crushed ice. Cover it with a small towel and apply to injection site. Apply for 15 minutes then remove x 15 minutes. Repeat sequence on day of procedure, until you go to bed. The purpose is to minimize swelling and discomfort after procedure.  Apply heat: Apply heat to procedure site starting the day following the procedure. The purpose is to treat any soreness and discomfort from the  procedure.  Food intake: Start with clear liquids (like water) and advance to regular food, as tolerated.   Physical activities: Keep activities to a minimum for the first 8 hours after the procedure.   Driving: If you have received any sedation, you are not allowed to drive for 24 hours after your procedure.  Blood thinner: Restart your blood thinner 6 hours after your procedure. (Only for those taking blood thinners)  Insulin: As soon as you can eat, you may resume your normal dosing schedule. (Only for those taking insulin)  Infection prevention: Keep procedure site clean and dry.  Post-procedure Pain Diary: Extremely important that this be done correctly and accurately. Recorded information will be used to determine the next step in treatment.  Pain evaluated is that of treated area only. Do not include pain from an untreated area.  Complete every hour, on the hour, for the initial 8 hours. Set an alarm to help you do this part accurately.  Do not go to sleep and have it completed later. It will not be accurate.  Follow-up appointment: Keep your follow-up appointment after the procedure. Usually 2 weeks for most procedures. (6 weeks in the case of radiofrequency.) Bring you pain diary.   Expect:  From numbing medicine (AKA: Local Anesthetics): Numbness or decrease in pain.  Onset: Full effect within 15 minutes of injected.  Duration: It will depend on the type of local anesthetic used. On the average, 1 to 8 hours.   From steroids: Decrease in swelling or inflammation. Once inflammation is improved, relief of the pain will follow.  Onset of benefits: Depends on the amount of swelling present.  The more swelling, the longer it will take for the benefits to be seen. In some cases, up to 10 days.  Duration: Steroids will stay in the system x 2 weeks. Duration of benefits will depend on multiple posibilities including persistent irritating factors.  From procedure: Some  discomfort is to be expected once the numbing medicine wears off. This should be minimal if ice and heat are applied as instructed.  Call if:  You experience numbness and weakness that gets worse with time, as opposed to wearing off.  New onset bowel or bladder incontinence. (This applies to Spinal procedures only)  Emergency Numbers:  Durning business hours (Monday - Thursday, 8:00 AM - 4:00 PM) (Friday, 9:00 AM - 12:00 Noon): [phone]  After hours: [phone] ____________________________________________________________________________________________   Epidural Steroid Injection An epidural steroid injection is a shot of steroid medicine and numbing medicine that is given into the space between the spinal cord and the bones in your back (epidural space). The shot helps relieve pain caused by an irritated or swollen nerve root. The amount of pain relief you get from the injection depends on what is causing the nerve to be swollen and irritated, and how long your pain lasts. You are more likely to benefit from this injection if your pain is strong and comes on suddenly rather than if you have had pain for a long time. Tell a health care provider about:  Any allergies you have.  All medicines you are taking, including vitamins, herbs, eye drops, creams, and over-the-counter medicines.  Any problems you or family members have had with anesthetic medicines.  Any blood disorders you have.  Any surgeries you have had.  Any medical conditions you have.  Whether you are pregnant or may be pregnant. What are the risks? Generally, this is a safe procedure. However, problems may occur, including:  Headache.  Bleeding.  Infection.  Allergic reaction to medicines.  Damage to your nerves.  What happens before the procedure? Staying hydrated Follow instructions from your health care provider about hydration, which may include:  Up to 2 hours before the procedure - you  may continue to drink clear liquids, such as water, clear fruit juice, black coffee, and plain tea.  Eating and drinking restrictions Follow instructions from your health care provider about eating and drinking, which may include:  8 hours before the procedure - stop eating heavy meals or foods such as meat, fried foods, or fatty foods.  6 hours before the procedure - stop eating light meals or foods, such as toast or cereal.  6 hours before the procedure - stop drinking milk or drinks that contain milk.  2 hours before the procedure - stop drinking clear liquids.  Medicine  You may be given medicines to lower anxiety.  Ask your health care provider about: ? Changing or stopping your regular medicines. This is especially important if you are taking diabetes medicines or blood thinners. ? Taking medicines such as aspirin and ibuprofen. These medicines can thin your blood. Do not take these medicines before your procedure if your health care provider instructs you not to. General instructions  Plan to have someone take you home from the hospital or clinic. What happens during the procedure?  You may receive a medicine to help you relax (sedative).  You will be asked to lie on your abdomen.  The injection site will be cleaned.  A numbing medicine (local anesthetic) will be used to numb the injection site.  A  needle will be inserted through your skin into the epidural space. You may feel some discomfort when this happens. An X-ray machine will be used to make sure the needle is put as close as possible to the affected nerve.  A steroid medicine and a local anesthetic will be injected into the epidural space.  The needle will be removed.  A bandage (dressing) will be put over the injection site. What happens after the procedure?  Your blood pressure, heart rate, breathing rate, and blood oxygen level will be monitored until the medicines you were given have worn off.  Your arm  or leg may feel weak or numb for a few hours.  The injection site may feel sore.  Do not drive for 24 hours if you received a sedative. This information is not intended to replace advice given to you by your health care provider. Make sure you discuss any questions you have with your health care provider. Document Released: 12/14/2007 Document Revised: 02/18/2016 Document Reviewed: 12/23/2015 [employer] Interactive Patient Education  [employer].

## 2018-04-06 NOTE — Progress Notes (Signed)
Patient's Name: James Friedman  MRN: [id number]  Referring Provider: Bobbye Morton, MD  DOB: 1946/07/08  PCP: Bobbye Morton, MD  DOS: 04/06/2018  Note by: Oswaldo Done, MD  Service setting: Ambulatory outpatient  Specialty: Interventional Pain Management  Patient type: Established  Location: [facility] (AMB) Pain Management Facility  Visit type: Interventional Procedure   Primary Reason for Visit: Interventional Pain Management Treatment. CC: Groin Pain (left ); Back Pain (lower left is worse ); and Knee Pain (bilateral)  Procedure #1:  Anesthesia, Analgesia, Anxiolysis:  Type: Therapeutic Intra-Articular Hyalgan Knee Injection   Right knee #5 and left knee #4  Region: Lateral infrapatellar Knee Region Level: Knee Joint Laterality: Bilateral  Type: Local Anesthesia Indication(s): Analgesia         Local Anesthetic: Lidocaine 1-2% Route: Infiltration ([place]/IM) IV Access: Declined Sedation: Declined    Indications: 1. Primary osteoarthritis of right knee   2. Primary osteoarthritis of left knee   3. Osteoarthritis of knee (Bilateral)   4. Chronic knee pain (Bilateral)    Procedure #2:  Anesthesia, Analgesia, Anxiolysis:  Type: Palliative Inter-Laminar Epidural Steroid Injection          Region: Lumbar Level: L2-3 Level. Laterality: Left-Sided Paramedial  Type: Local Anesthesia Indication(s): Analgesia         Route: Infiltration ([place]/[facility]) IV Access: Declined Sedation: Declined  Local Anesthetic: Lidocaine 1-2%   Indications: 1. DDD (degenerative disc disease), lumbar   2. 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 Lumbar spinal stenosis (L3-4)   5. Lumbosacral radiculopathy at S1 (Right side)    Pain Score: Pre-procedure: 10-Worst pain ever/10 Post-procedure: 0-No pain/10  Pre-op Assessment:  James Friedman is a 72 y.o. (year old), male patient, seen today for interventional treatment. He  has a  past surgical history that includes Knee surgery (Bilateral); Neck surgery; Carpal tunnel release; Shoulder surgery (Right); Cataract extraction, extracapsular (Left, 08/04/2015); and Cataract extraction w/PHACO (Right, 04/19/2016). James Friedman has a current medication list which includes the following prescription(s): acetaminophen, albuterol, aluminum hydroxide, amlodipine, antiseptic oral rinse, atorvastatin, capsaicin, cetirizine, chlorpheniramine, clotrimazole, diclofenac sodium, docusate sodium, duloxetine, fluticasone, gabapentin, glucagon, hydroxyzine, insulin aspart, insulin detemir, lisinopril, magnesium, methocarbamol, metoprolol tartrate, mometasone, multivitamin, naloxone, naproxen, omeprazole, oxycodone, oxygen-helium, pantoprazole, polyethylene glycol powder, promethazine, sitagliptin-metformin, testosterone cypionate, trazodone, vitamin c, and vitamin a & d. His primarily concern today is the Groin Pain (left ); Back Pain (lower left is worse ); and Knee Pain (bilateral)  Initial Vital Signs:  Pulse/HCG Rate: 93  Temp: 98.6 F (37 C) Resp: 16 BP: (!) 134/109 SpO2: 100 %  BMI: Estimated body mass index is 41.64 kg/m as calculated from the following:   Height as of this encounter: 5\' 9"  (1.753 m).   Weight as of this encounter: 282 lb (127.9 kg).  Risk Assessment: Allergies: Reviewed. He has No Known Allergies.  Allergy Precautions: None required Coagulopathies: Reviewed. None identified.  Blood-thinner therapy: None at this time Active Infection(s): Reviewed. None identified. James Friedman is afebrile  Site Confirmation: James Friedman was asked to confirm the procedure and laterality before marking the site Procedure checklist: Completed Consent: Before the procedure and under the influence of no sedative(s), amnesic(s), or anxiolytics, the patient was informed of the treatment options, risks and possible complications. To fulfill our ethical and legal obligations, as recommended by  the American Medical Association's Code of Ethics, I have informed the patient of my clinical impression; the  nature and purpose of the treatment or procedure; the risks, benefits, and possible complications of the intervention; the alternatives, including doing nothing; the risk(s) and benefit(s) of the alternative treatment(s) or procedure(s); and the risk(s) and benefit(s) of doing nothing. The patient was provided information about the general risks and possible complications associated with the procedure. These may include, but are not limited to: failure to achieve desired goals, infection, bleeding, organ or nerve damage, allergic reactions, paralysis, and death. In addition, the patient was informed of those risks and complications associated to the procedure, such as failure to decrease pain; infection; bleeding; organ or nerve damage with subsequent damage to sensory, motor, and/or autonomic systems, resulting in permanent pain, numbness, and/or weakness of one or several areas of the body; allergic reactions; (i.e.: anaphylactic reaction); and/or death. Furthermore, the patient was informed of those risks and complications associated with the medications. These include, but are not limited to: allergic reactions (i.e.: anaphylactic or anaphylactoid reaction(s)); adrenal axis suppression; blood sugar elevation that in diabetics may result in ketoacidosis or comma; water retention that in patients with history of congestive heart failure may result in shortness of breath, pulmonary edema, and decompensation with resultant heart failure; weight gain; swelling or edema; medication-induced neural toxicity; particulate matter embolism and blood vessel occlusion with resultant organ, and/or nervous system infarction; and/or aseptic necrosis of one or more joints. Finally, the patient was informed that Medicine is not an exact science; therefore, there is also the possibility of unforeseen or unpredictable  risks and/or possible complications that may result in a catastrophic outcome. The patient indicated having understood very clearly. We have given the patient no guarantees and we have made no promises. Enough time was given to the patient to ask questions, all of which were answered to the patient's satisfaction. James Friedman has indicated that he wanted to continue with the procedure. Attestation: I, the ordering provider, attest that I have discussed with the patient the benefits, risks, side-effects, alternatives, likelihood of achieving goals, and potential problems during recovery for the procedure that I have provided informed consent. Date  Time: 04/06/2018  9:09 AM  Pre-Procedure Preparation:  Monitoring: As per clinic protocol. Respiration, ETCO2, SpO2, BP, heart rate and rhythm monitor placed and checked for adequate function Safety Precautions: Patient was assessed for positional comfort and pressure points before starting the procedure. Time-out: I initiated and conducted the "Time-out" before starting the procedure, as per protocol. The patient was asked to participate by confirming the accuracy of the "Time Out" information. Verification of the correct person, site, and procedure were performed and confirmed by me, the nursing staff, and the patient. "Time-out" conducted as per Joint Commission's Universal Protocol (UP.01.01.01). Time: 1324(4010 for knees)  Description of Procedure #1:  Position: Sitting Target Area: Knee Joint Approach: Just above the Lateral tibial plateau, lateral to the infrapatellar tendon. Area Prepped: Entire knee area, from the mid-thigh to the mid-shin. Prepping solution: ChloraPrep (2% chlorhexidine gluconate and 70% isopropyl alcohol) Safety Precautions: Aspiration looking for blood return was conducted prior to all injections. At no point did we inject any substances, as a needle was being advanced. No attempts were made at seeking any paresthesias. Safe  injection practices and needle disposal techniques used. Medications properly checked for expiration dates. SDV (single dose vial) medications used. Description of the Procedure: Protocol guidelines were followed. The patient was placed in position over the fluoroscopy table. The target area was identified and the area prepped in the usual manner. Skin & deeper  tissues infiltrated with local anesthetic. Appropriate amount of time allowed to pass for local anesthetics to take effect. The procedure needles were then advanced to the target area. Proper needle placement secured. Negative aspiration confirmed. Solution injected in intermittent fashion, asking for systemic symptoms every 0.5cc of injectate. The needles were then removed and the area cleansed, making sure to leave some of the prepping solution back to take advantage of its long term bactericidal properties.  Start Time: 1914(7829 knees) hrs.  Materials:  Needle(s) Type: Regular needle Gauge: 25G Length: 1.5-in Medication(s): Please see orders for medications and dosing details.  Imaging Guidance for procedure #1:  Type of Imaging Technique: None used Indication(s): N/A Exposure Time: No patient exposure Contrast: None used. Fluoroscopic Guidance: N/A Ultrasound Guidance: N/A Interpretation: N/A  Description of Procedure #2:  Position: Prone with head of the table was raised to facilitate breathing. Target Area: The interlaminar space, initially targeting the lower laminar border of the superior vertebral body. Approach: Paramedial approach. Area Prepped: Entire Posterior Lumbar Region Prepping solution: ChloraPrep (2% chlorhexidine gluconate and 70% isopropyl alcohol) Safety Precautions: Aspiration looking for blood return was conducted prior to all injections. At no point did we inject any substances, as a needle was being advanced. No attempts were made at seeking any paresthesias. Safe injection practices and needle disposal  techniques used. Medications properly checked for expiration dates. SDV (single dose vial) medications used. Description of the Procedure: Protocol guidelines were followed. The procedure needle was introduced through the skin, ipsilateral to the reported pain, and advanced to the target area. Bone was contacted and the needle walked caudad, until the lamina was cleared. The epidural space was identified using "loss-of-resistance technique" with 2-3 ml of PF-NaCl (0.9% NSS), in a 5cc LOR glass syringe.  Vitals:   04/06/18 1017 04/06/18 1027 04/06/18 1037 04/06/18 1047  BP: 135/87 (!) 125/94 123/90 132/88  Pulse: 86 83 85 84  Resp: 16 12 13 15   Temp:      TempSrc:      SpO2: 96% 93% 94% 95%  Weight:      Height:        End Time: 1045 hrs.  Materials:  Needle(s) Type: Epidural needle Gauge: 20G Length: 15cm Medication(s): Please see orders for medications and dosing details.  Imaging Guidance (Spinal) for procedure #2:  Type of Imaging Technique: Fluoroscopy Guidance (Spinal) Indication(s): Assistance in needle guidance and placement for procedures requiring needle placement in or near specific anatomical locations not easily accessible without such assistance. Exposure Time: Please see nurses notes. Contrast: Before injecting any contrast, we confirmed that the patient did not have an allergy to iodine, shellfish, or radiological contrast. Once satisfactory needle placement was completed at the desired level, radiological contrast was injected. Contrast injected under live fluoroscopy. No contrast complications. See chart for type and volume of contrast used. Fluoroscopic Guidance: I was personally present during the use of fluoroscopy. "Tunnel Vision Technique" used to obtain the best possible view of the target area. Parallax error corrected before commencing the procedure. "Direction-depth-direction" technique used to introduce the needle under continuous pulsed fluoroscopy. Once target  was reached, antero-posterior, oblique, and lateral fluoroscopic projection used confirm needle placement in all planes. Images permanently stored in EMR. Interpretation: I personally interpreted the imaging intraoperatively. Adequate needle placement confirmed in multiple planes. Appropriate spread of contrast into desired area was observed. No evidence of afferent or efferent intravascular uptake. No intrathecal or subarachnoid spread observed. Permanent images saved into the patient's record.  Antibiotic  Prophylaxis:   Anti-infectives (From admission, onward)   None     Indication(s): None identified  Post-operative Assessment:  Post-procedure Vital Signs:  Pulse/HCG Rate: 84  Temp: 98.6 F (37 C) Resp: 15 BP: 132/88 SpO2: 95 %  EBL: None  Complications: No immediate post-treatment complications observed by team, or reported by patient.  Note: The patient tolerated the entire procedure well. A repeat set of vitals were taken after the procedure and the patient was kept under observation following institutional policy, for this type of procedure. Post-procedural neurological assessment was performed, showing return to baseline, prior to discharge. The patient was provided with post-procedure discharge instructions, including a section on how to identify potential problems. Should any problems arise concerning this procedure, the patient was given instructions to immediately contact us, at any time, without hesitation. In any case, we plan to contact the patient by telephone for a follow-up status report regarding this interventional procedure.  Comments:  No additional relevant information.  Plan of Care   Long-term POC:  NOTE:  1. Not an appropriate candidate for lumbar radiofrequency ablation secondary to body habitus.  2. No more steroid injection any sooner than every 2 months, regardless of location, in order to minimize the possible side effects of the steroids, such as a  septic necrosis and/or endocrinopathies.  3. Aggressive weight management. It is absolutely necessary that he bring his BMI close to 30. (Goal: BMI<30) the patient needs to actively get involved with a nutritionist/dietitian that can supervise him on a regular basis. The family also needs to be involved so as to keep him away from tempt patient. A goal needs to be set to 10% of his body weight per year. His current weight is 282 Lbs. This means that he needs to lose 28.2 pounds in the next 12 months. This translates to 2.35 lbs/month. The 10% change is suggested so as to avoid rapid weight loss and subsequent regaining of the lost weight. This weight loss plan must be supervised and the patient held accountable as he tell me that he is loosing weight, but when we look at the evidence (see below), clearly there is no progress. More aggressive alternatives, such as bariatric surgery should be considered.  BMI: Estimated body mass index is 41.64 kg/m as calculated from the following:   Height as of this encounter: 5\' 9"  (1.753 m).   Weight as of this encounter: 282 lb (127.9 kg).  BMI Readings from Last 20 Encounters:  04/06/18 41.64 kg/m  03/02/18 41.64 kg/m  01/10/18 43.12 kg/m  12/27/17 42.83 kg/m  12/13/17 43.12 kg/m  11/29/17 43.12 kg/m  11/16/17 43.12 kg/m  09/29/17 43.12 kg/m  09/02/17 43.12 kg/m  06/27/17 41.64 kg/m  06/07/17 41.64 kg/m  05/31/17 41.64 kg/m  05/12/17 43.12 kg/m  04/26/17 41.64 kg/m  04/11/17 41.35 kg/m  03/28/17 41.64 kg/m  03/22/17 41.79 kg/m  03/07/17 41.35 kg/m  03/03/17 41.35 kg/m  02/16/17 41.64 kg/m   Wt Readings from Last 20 Encounters:  04/06/18 282 lb (127.9 kg)  03/02/18 282 lb (127.9 kg)  01/10/18 292 lb (132.5 kg)  12/27/17 290 lb (131.5 kg)  12/13/17 292 lb (132.5 kg)  11/29/17 292 lb (132.5 kg)  11/16/17 292 lb (132.5 kg)  09/29/17 292 lb (132.5 kg)  09/02/17 292 lb (132.5 kg)  06/27/17 282 lb (127.9 kg)  06/07/17 282 lb  (127.9 kg)  05/31/17 282 lb (127.9 kg)  05/12/17 292 lb (132.5 kg)  04/26/17 282 lb (127.9 kg)  04/11/17  280 lb (127 kg)  03/28/17 282 lb (127.9 kg)  03/22/17 283 lb (128.4 kg)  03/07/17 280 lb (127 kg)  03/03/17 280 lb (127 kg)  02/16/17 282 lb (127.9 kg)   Ideal body weight: 70.7 kg (155 lb 13.8 oz) Adjusted ideal body weight: 93.6 kg (206 lb 5.1 oz)  BMI level Category Range association with higher incidence of chronic pain  <18 kg/m2 Underweight   18.5-24.9 kg/m2 Ideal body weight   25-29.9 kg/m2 Overweight Increased incidence by 20%  30-34.9 kg/m2 Obese (Class I) Increased incidence by 68%  35-39.9 kg/m2 Severe obesity (Class II) Increased incidence by 136%  >40 kg/m2 Extreme obesity (Class III) Increased incidence by 254%   The above table demonstrates the importance of managing the weight when he comes to pain.  4. The long-term goal in pain management is to be able to increase functionality. In my opinion, unless to patient can lose the weight, it is very unlikely that he will achieve this improvement in functionality. To assist with his weight management and weight loss program, the patient should be involved in regular physical activities. Unfortunately, due to his weight and osteoarthritis affecting his knees and spine, he is a very poor candidate to exercise in a stander manner. Therefore, it is my opinion that he would benefit from exercising in a swimming pool where the issue of "gravity" can be nulled by his buoyancy.  5. Family/group therapy. I see absolutely no way that this patient can achieve this goal without family support and supervision. Therefore I highly recommend family/group therapy, on a regular basis, to tackle this issue.    Imaging Orders     DG C-Arm 1-60 Min-No Report  Procedure Orders     KNEE INJECTION     KNEE INJECTION     Lumbar Epidural Injection  Medications ordered for procedure: Meds ordered this encounter  Medications  . lidocaine (PF)  (XYLOCAINE) 1 % injection 4 mL  . ropivacaine (PF) 2 mg/mL (0.2%) (NAROPIN) injection 4 mL  . Sodium Hyaluronate SOSY 2 mL  . Sodium Hyaluronate SOSY 2 mL  . iopamidol (ISOVUE-M) 41 % intrathecal injection 10 mL    Must be Myelogram-compatible. If not available, you may substitute with a water-soluble, non-ionic, hypoallergenic, myelogram-compatible radiological contrast medium.  Marland Kitchen. lidocaine (XYLOCAINE) 2 % (with pres) injection 400 mg  . sodium chloride flush (NS) 0.9 % injection 2 mL  . ropivacaine (PF) 2 mg/mL (0.2%) (NAROPIN) injection 2 mL  . triamcinolone acetonide (KENALOG-40) injection 40 mg   Medications administered: We administered lidocaine (PF), ropivacaine (PF) 2 mg/mL (0.2%), Sodium Hyaluronate, Sodium Hyaluronate, iopamidol, lidocaine, sodium chloride flush, ropivacaine (PF) 2 mg/mL (0.2%), and triamcinolone acetonide.  See the medical record for exact dosing, route, and time of administration.  New Prescriptions   No medications on file   Disposition: Discharge home  Discharge Date & Time: 04/06/2018; 1050 hrs.   Physician-requested Follow-up: Return for PPE (2 wks) + Procedure (no sedation): (L) Hyalgan #5.  No future appointments. Primary Care Physician: Bobbye Mortoneilly, Sharon A, MD [facility] Note by: Oswaldo DoneFrancisco A Isiaah Cuervo, MD Date: 04/06/2018; Time: 10:57 AM  Disclaimer:  Medicine is not an exact science. The only guarantee in medicine is that nothing is guaranteed. It is important to note that the decision to proceed with this intervention was based on the information collected from the patient. The Data and conclusions were drawn from the patient's questionnaire, the interview, and the physical examination. Because the  information was provided in large part by the patient, it cannot be guaranteed that it has not been purposely or unconsciously manipulated. Every effort has been made to obtain as much relevant data as possible for this  evaluation. It is important to note that the conclusions that lead to this procedure are derived in large part from the available data. Always take into account that the treatment will also be dependent on availability of resources and existing treatment guidelines, considered by other Pain Management Practitioners as being common knowledge and practice, at the time of the intervention. For Medico-Legal purposes, it is also important to point out that variation in procedural techniques and pharmacological choices are the acceptable norm. The indications, contraindications, technique, and results of the above procedure should only be interpreted and judged by a Board-Certified Interventional Pain Specialist with extensive familiarity and expertise in the same exact procedure and technique.

## 2018-04-07 ENCOUNTER — Telehealth: Payer: Self-pay | Admitting: *Deleted

## 2018-04-07 NOTE — Telephone Encounter (Signed)
No problems post procedure. 

## 2018-10-09 ENCOUNTER — Other Ambulatory Visit: Payer: Self-pay

## 2018-10-09 ENCOUNTER — Emergency Department: Payer: Medicare (Managed Care)

## 2018-10-09 ENCOUNTER — Emergency Department
Admission: EM | Admit: 2018-10-09 | Discharge: 2018-10-09 | Disposition: A | Discharge status: Home / Self Care | Payer: Medicare (Managed Care) | Attending: Emergency Medicine | Admitting: Emergency Medicine

## 2018-10-09 DIAGNOSIS — J45909 Unspecified asthma, uncomplicated: Secondary | ICD-10-CM | POA: Diagnosis not present

## 2018-10-09 DIAGNOSIS — Z87891 Personal history of nicotine dependence: Secondary | ICD-10-CM | POA: Diagnosis not present

## 2018-10-09 DIAGNOSIS — M545 Low back pain: Secondary | ICD-10-CM | POA: Diagnosis present

## 2018-10-09 DIAGNOSIS — E119 Type 2 diabetes mellitus without complications: Secondary | ICD-10-CM | POA: Insufficient documentation

## 2018-10-09 DIAGNOSIS — G8929 Other chronic pain: Secondary | ICD-10-CM | POA: Insufficient documentation

## 2018-10-09 DIAGNOSIS — E785 Hyperlipidemia, unspecified: Secondary | ICD-10-CM | POA: Insufficient documentation

## 2018-10-09 DIAGNOSIS — I10 Essential (primary) hypertension: Secondary | ICD-10-CM | POA: Diagnosis not present

## 2018-10-09 DIAGNOSIS — M5136 Other intervertebral disc degeneration, lumbar region: Secondary | ICD-10-CM | POA: Insufficient documentation

## 2018-10-09 DIAGNOSIS — F419 Anxiety disorder, unspecified: Secondary | ICD-10-CM | POA: Diagnosis not present

## 2018-10-09 DIAGNOSIS — R531 Weakness: Secondary | ICD-10-CM | POA: Diagnosis not present

## 2018-10-09 LAB — URINALYSIS, COMPLETE (UACMP) WITH MICROSCOPIC
Bacteria, UA: NONE SEEN
Bilirubin Urine: NEGATIVE
Glucose, UA: >=500 mg/dL — AB
Hgb urine dipstick: NEGATIVE
Ketones, ur: 5 mg/dL — AB
Leukocytes, UA: NEGATIVE
Nitrite: NEGATIVE
Protein, ur: NEGATIVE mg/dL
Specific Gravity, Urine: 1.017 (ref 1.005–1.030)
Squamous Epithelial / HPF: NONE SEEN (ref 0–5)
WBC, UA: NONE SEEN WBC/hpf (ref 0–5)
pH: 6 (ref 5.0–8.0)

## 2018-10-09 LAB — CBC WITH DIFFERENTIAL/PLATELET
Abs Immature Granulocytes: 0.03 10*3/uL (ref 0.00–0.07)
BASOS ABS: 0.1 10*3/uL (ref 0.0–0.1)
Basophils Relative: 1 %
Eosinophils Absolute: 0.2 10*3/uL (ref 0.0–0.5)
Eosinophils Relative: 3 %
HCT: 43.3 % (ref 39.0–52.0)
Hemoglobin: 13.4 g/dL (ref 13.0–17.0)
Immature Granulocytes: 0 %
Lymphocytes Relative: 27 %
Lymphs Abs: 2.1 10*3/uL (ref 0.7–4.0)
MCH: 24.8 pg — ABNORMAL LOW (ref 26.0–34.0)
MCHC: 30.9 g/dL (ref 30.0–36.0)
MCV: 80 fL (ref 80.0–100.0)
Monocytes Absolute: 0.6 10*3/uL (ref 0.1–1.0)
Monocytes Relative: 8 %
Neutro Abs: 4.7 10*3/uL (ref 1.7–7.7)
Neutrophils Relative %: 61 %
Platelets: 300 10*3/uL (ref 150–400)
RBC: 5.41 MIL/uL (ref 4.22–5.81)
RDW: 15.9 % — ABNORMAL HIGH (ref 11.5–15.5)
WBC: 7.7 10*3/uL (ref 4.0–10.5)
nRBC: 0 % (ref 0.0–0.2)

## 2018-10-09 LAB — COMPREHENSIVE METABOLIC PANEL
ALBUMIN: 3.9 g/dL (ref 3.5–5.0)
ALT: 24 U/L (ref 0–44)
ANION GAP: 8 (ref 5–15)
AST: 29 U/L (ref 15–41)
Alkaline Phosphatase: 84 U/L (ref 38–126)
BUN: 15 mg/dL (ref 8–23)
CO2: 29 mmol/L (ref 22–32)
Calcium: 9 mg/dL (ref 8.9–10.3)
Chloride: 98 mmol/L (ref 98–111)
Creatinine, Ser: 0.79 mg/dL (ref 0.61–1.24)
GFR calc Af Amer: >60 mL/min (ref >60)
GFR calc non Af Amer: >60 mL/min (ref >60)
Glucose, Bld: 208 mg/dL — ABNORMAL HIGH (ref 70–99)
Potassium: 4.8 mmol/L (ref 3.5–5.1)
Sodium: 135 mmol/L (ref 135–145)
TOTAL PROTEIN: 6.9 g/dL (ref 6.5–8.1)
Total Bilirubin: 0.8 mg/dL (ref 0.3–1.2)

## 2018-10-09 LAB — TROPONIN I: Troponin I: <0.03 ng/mL (ref <0.03)

## 2018-10-09 MED ORDER — OXYCODONE-ACETAMINOPHEN 7.5-325 MG PO TABS: 1.0000 | ORAL_TABLET | ORAL | 0 refills | Status: DC | PRN

## 2018-10-09 MED ORDER — METHYLPREDNISOLONE SODIUM SUCC 125 MG IJ SOLR
125.0000 mg | Freq: Once | INTRAMUSCULAR | Status: AC
  Administered 2018-10-09: 125 mg via INTRAVENOUS
  Filled 2018-10-09: qty 2

## 2018-10-09 MED ORDER — PREDNISONE 10 MG (21) PO TBPK: ORAL_TABLET | ORAL | 0 refills | Status: DC

## 2018-10-09 MED ORDER — HYDROMORPHONE HCL 1 MG/ML IJ SOLN
0.5000 mg | Freq: Once | INTRAMUSCULAR | Status: AC
  Administered 2018-10-09: 0.5 mg via INTRAVENOUS
  Filled 2018-10-09: qty 1

## 2018-10-09 NOTE — ED Triage Notes (Signed)
Pt arrives via ems from home. Ems reports pt has had increased weakness over the last 3 days in lower extremities. Pt alert and oriented x 4 on arrival with no acute distress noted at this time. Pt states normally uses wheel chair but can walk with cane and support for 10-15 feet at ta time. Pt complains of pain in back, neck pain, and both knees.

## 2018-10-09 NOTE — ED Notes (Signed)
PT working with pt at this time.  CSW states pt will be discharged from facility and [facility] will begin to arrange care and therapy tomorrow. [facility], patient, and family plan to have a meeting tomorrow. Family unable to provide transport home, and pt will need EMS transport

## 2018-10-09 NOTE — ED Provider Notes (Signed)
[facility] Emergency Department Provider Note       Time seen: ----------------------------------------- 9:36 AM on 10/09/2018 -----------------------------------------   I have reviewed the triage vital signs and the nursing notes.  HISTORY   Chief Complaint No chief complaint on file.    HPI James Friedman is a 73 y.o. male with a history of anxiety, asthma, degenerative disc disease, diabetes, hypertension, hyperlipidemia, back pain who presents to the ED for low back pain and weakness.  Patient describes frequent falls with increased weakness in his legs.  [facility] taken [facility].  He does have a history of chronic low back pain but states he had weaned himself off morphine and hydrocodone several years ago.  He denies recent illness or other complaints.  Past Medical History:  Diagnosis Date  . Anxiety   . Asthma   . Atypical chest pain    a. 2014 Neg MV.  . Back pain   . BPH (benign prostatic hypertrophy)   . Carpal tunnel syndrome   . Carpal tunnel syndrome on right 07/17/2015  . Chest pain 07/26/2013  . DDD (degenerative disc disease)   . Diabetes mellitus without complication (HCC)   . Essential hypertension   . Falls   . GERD (gastroesophageal reflux disease)   . Heme positive stool   . History of bilateral knee surgery 07/17/2015  . Hyperlipidemia   . Hypertension   . Morbid obesity (HCC)   . OA (osteoarthritis)    knees  . OSA (obstructive sleep apnea)    CPAP  . Ventral hernia   . Wears dentures    full upper and lower    Patient Active Problem List   Diagnosis Date Noted  . DDD (degenerative disc disease), lumbar 11/16/2017  . DDD (degenerative disc disease), cervical 11/16/2017  . Cervicalgia 11/16/2017  . Abnormal MRI, lumbar spine (07/21/17) 10/17/2017  . Polypharmacy 06/27/2017  . Chronic lumbar radiculopathy 06/07/2017  . Lumbar spinal stenosis (L3-4) 06/07/2017  . Suprapatellar bursitis  of right knee 04/26/2017  . Primary osteoarthritis of left knee 03/07/2017  . Primary osteoarthritis of right knee 03/07/2017  . Cervical myofascial pain syndrome (Left trapezius) 02/16/2017  . Osteoarthritis of knee (Bilateral) 02/02/2017  . Osteoarthritis of shoulder (Bilateral) 02/02/2017  . Chronic shoulder Arthropathy (Bilateral) 02/02/2017  . Lumbar spondylosis 07/08/2016  . COPD 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 (HCC) 07/08/2016  . Polyneuropathy 01/29/2016  . Postoperative pain (post-radiofrequency) 12/09/2015  . Abnormal EMG/PNCV (11/26/2015) 12/04/2015  . Chronic knee pain (Bilateral) 11/12/2015  . Diabetic peripheral neuropathy (HCC) 11/12/2015  . Chronic lumbar radicular pain (Bilateral) (R>L) (S1 Dermatome on the left) 11/12/2015  . Essential hypertension   . Diabetes mellitus without complication (HCC)   . Atypical chest pain   . 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) 08/26/2015  . Grade 1 Anterolisthesis of L4 over L5 08/26/2015  . Lumbar disc herniation (Left L4-5) (Right L5-S1) 08/26/2015  . Chronic lower extremity pain (Secondary source of pain) (Bilateral) (R>L) 08/26/2015  . Transient weakness of left lower extremity 07/31/2015  . At high risk for falls 07/31/2015  . Chronic pain syndrome 07/17/2015  . Chronic low back pain (Primary Source of Pain) (Bilateral) (L>R) 07/17/2015  . Lumbosacral radiculopathy at S1 (Right side) 07/17/2015  . Lumbar facet syndrome (Bilateral) (L>R) 07/17/2015  . Chronic neck pain (Bilateral) (R>L) 07/17/2015  . Chronic cervical radicular pain (Bilateral) (R>L) 07/17/2015  . Cervical spondylosis 07/17/2015  . Morbid obesity (HCC) 07/17/2015  .  Long term current use of opiate analgesic 07/17/2015  . Long term prescription opiate use 07/17/2015  . Opiate use (60 MME/Day) 07/17/2015  . Opiate dependence (HCC) 07/17/2015  . Chronic sacroiliac joint pain (Bilateral) (R>L) 07/17/2015  . 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  Chronic upper extremity pain (Bilateral) (R>L) 07/17/2015  . History of bilateral knee surgery 07/17/2015  . Diffuse myofascial pain syndrome 07/17/2015  . Lumbar facet hypertrophy (L4-5 and L5-S1) 07/17/2015  . Carpal tunnel syndrome on right (Released) 07/17/2015    Class: History of  . Hyperlipidemia   . Chronic shoulder pain (Bilateral) (L>R) 03/12/2013  . Non-traumatic rotator cuff tear 02/26/2013    Past Surgical History:  Procedure Laterality Date  . CARPAL TUNNEL RELEASE    . CATARACT EXTRACTION EXTRACAPSULAR Left 08/04/2015   Procedure: CATARACT EXTRACTION EXTRACAPSULAR WITH INTRAOCULAR LENS PLACEMENT (IOC);  Surgeon: Sherald Hess, MD;  Location: [facility];  Service: Ophthalmology;  Laterality: Left;  DIABETIC - insulin and oral meds  . CATARACT EXTRACTION W/PHACO Right 04/19/2016   Procedure: CATARACT EXTRACTION PHACO AND INTRAOCULAR LENS PLACEMENT (IOC);  Surgeon: Sherald Hess, MD;  Location[facility];  Service: Ophthalmology;  Laterality: Right;  RIGHT DIABETIC - insulin and oral meds Sleep apnea  . KNEE SURGERY Bilateral   . NECK SURGERY    . SHOULDER SURGERY Right     Allergies Patient has no known allergies.  Social History Social History   Tobacco Use  . Smoking status: Former Smoker    Packs/day: 0.25    Years: 15.00    Pack years: 3.75    Types: Cigarettes  . Smokeless tobacco: Current User    Types: Chew  Substance Use Topics  . Alcohol use: No    Alcohol/week: 0.0 standard drinks  . Drug use: No   Review of Systems Constitutional: Negative for fever. Cardiovascular: Negative for chest pain. Respiratory: Negative for shortness of breath. Gastrointestinal: Negative for abdominal pain, vomiting and diarrhea. Musculoskeletal: Positive for back pain Skin: Negative for rash. Neurological: Negative for headaches, focal weakness or numbness.  All systems negative/normal/unremarkable except as stated in the  HPI  ____________________________________________   PHYSICAL EXAM:  VITAL SIGNS: ED Triage Vitals  Enc Vitals Group     BP      Pulse      Resp      Temp      Temp src      SpO2      Weight      Height      Head Circumference      Peak Flow      Pain Score      Pain Loc      Pain Edu?      Excl. in GC?    Constitutional: Alert and oriented. Well appearing and in no distress. ENT      Head: Normocephalic and atraumatic.      Nose: No congestion/rhinnorhea.      Mouth/Throat: Mucous membranes are moist.      Neck: No stridor. Cardiovascular: Normal rate, regular rhythm. No murmurs, rubs, or gallops. Respiratory: Normal respiratory effort without tachypnea nor retractions. Breath sounds are clear and equal bilaterally. No wheezes/rales/rhonchi. Gastrointestinal: Soft and nontender. Normal bowel sounds Musculoskeletal: Nontender with normal range of motion in extremities. No lower extremity tenderness nor edema. Neurologic:  Normal speech and language.  Weakness is noted in the lower extremities bilaterally, right greater than left.  Slight decrease sensation in the legs.  Otherwise neurologic exam is normal Skin:  Skin is warm, dry and intact. No rash noted. Psychiatric: Mood and affect are normal. Speech and behavior are normal.  ____________________________________________  ED COURSE:  As part of my medical decision making, I reviewed the following data within the electronic MEDICAL RECORD NUMBER History obtained from family if available, nursing notes, old chart and ekg, as well as notes from prior ED visits. Patient presented for weakness primarily from low back pain, we will assess with labs and imaging as indicated at this time.   Procedures ____________________________________________   LABS (pertinent positives/negatives)  Labs Reviewed  CBC WITH DIFFERENTIAL/PLATELET - Abnormal; Notable for the following components:      Result Value   MCH 24.8 (*)    RDW 15.9  (*)    All other components within normal limits  COMPREHENSIVE METABOLIC PANEL - Abnormal; Notable for the following components:   Glucose, Bld 208 (*)    All other components within normal limits  URINALYSIS, COMPLETE (UACMP) WITH MICROSCOPIC - Abnormal; Notable for the following components:   Color, Urine YELLOW (*)    APPearance CLEAR (*)    Glucose, UA >=500 (*)    Ketones, ur 5 (*)    All other components within normal limits  TROPONIN I    RADIOLOGY Images were viewed by me  Lumbar spine x-rays IMPRESSION: Moderate spondylosis of the lumbar spine with severe disc disease at the L3-4 level with moderate disc disease at the L2-3 level. Mild disc disease at the L4-5 and L5-S1 levels. ____________________________________________   DIFFERENTIAL DIAGNOSIS   Dehydration, electrolyte abnormality, chronic pain, sciatica, spinal stenosis, disc herniation  FINAL ASSESSMENT AND PLAN  Weakness, degenerative disc disease, chronic back pain   Plan: The patient had presented for weakness secondary to low back pain. Patient's labs not reveal any acute process. Patient's imaging did reveal severe degenerative changes in his back.  This appears to be a chronic progressive course that has been going on for months if not years.  Family states they can no longer care for him.  I will discuss with physical therapy and social work for possible rehab placement.   Ulice DashJohnathan E , MD    Note: This note was generated in part or whole with voice recognition software. Voice recognition is usually quite accurate but there are transcription errors that can and very often do occur. I apologize for any typographical errors that were not detected and corrected.     Emily Filbert,  E, MD 10/09/18 [phone]

## 2018-10-09 NOTE — ED Notes (Signed)
Pt cleared by MD to eat and drink. Pt given [place] tray sandwich and water to drink

## 2018-10-09 NOTE — Evaluation (Signed)
Physical Therapy Evaluation Patient Details Name: James Friedman MRN: [id number] DOB: 1945/10/18 Today's Date: 10/09/2018   History of Present Illness  Pt is a 73 year old male who comes to the ED with c/o LBP and weakness.  Pt has had 3 falls in the past 2 weeks and has avoided walking due to fear of falling as a result.  PMH includes anxiety, asthma, DDD, DM, Htn and HLD.  Clinical Impression  Pt in bed and appearing lethargic upon PT arrival.  He reported pain in multiple sites including back and lower legs.  Pain was exacerbated by active SLR on L and pt was unable to initiate this movement on R LE.  He presented with fair strength where not limited by pain.  Pt required Max +2 assist to sit at EOB and immediately experienced O2 desat to 78%.  PT assisted pt back to bed and pt quickly recovered to 89-90%.  Ambulation deferred for this reason and RN notified.  Pt will continue to benefit from skilled PT with focus on strength, tolerance to activity and pain management.  Due to concern for caregiver support and significant recent decrease in mobility.    Follow Up Recommendations SNF    Equipment Recommendations  (TBD at next venue of care)    Recommendations for Other Services       Precautions / Restrictions Precautions Precautions: Fall Precaution Comments: Recent multiple fall hx Restrictions Weight Bearing Restrictions: No      Mobility  Bed Mobility Overal bed mobility: Needs Assistance Bed Mobility: Supine to Sit     Supine to sit: Max assist;+2 for physical assistance     General bed mobility comments: Pt struggled to bring LE's over EOB and required back support.  O2 desat to 78% immediately when sitting.  Transfers Overall transfer level: (Deferred)                  Ambulation/Gait                Stairs            Wheelchair Mobility    Modified Rankin (Stroke Patients Only)       Balance Overall balance assessment: Needs  assistance Sitting-balance support: Bilateral upper extremity supported;Feet unsupported Sitting balance-Leahy Scale: Poor                                       Pertinent Vitals/Pain Pain Assessment: Faces Faces Pain Scale: Hurts even more Pain Location: Bilateral LE's and low back Pain Descriptors / Indicators: Grimacing;Pins and needles Pain Intervention(s): Limited activity within patient's tolerance;Monitored during session    Home Living Family/patient expects to be discharged to:: Skilled nursing facility Living Arrangements: Spouse/significant other;Other relatives Available Help at Discharge: Available PRN/intermittently;Family(Family stated that they are not able to continue to assist pt with getting up from a fall due to pt's stature and health concerns of family.) Type of Home: House Home Access: Ramped entrance     Home Layout: One level Home Equipment: Walker - 2 wheels;Wheelchair - manual      Prior Function Level of Independence: Independent with assistive device(s)         Comments: Pt had been using a RW to ambulate short household distances prior to past 2 weeks     Hand Dominance        Extremity/Trunk Assessment   Upper Extremity Assessment Upper Extremity  Assessment: Overall WFL for tasks assessed    Lower Extremity Assessment Lower Extremity Assessment: Overall WFL for tasks assessed(Ankle DF/PF: 4/5, unable to perform SLR on L side, able to perform partial SLR on L side, both produced pain)    Cervical / Trunk Assessment Cervical / Trunk Assessment: Normal  Communication   Communication: No difficulties  Cognition Arousal/Alertness: Lethargic(Pt closing eyes intermittently throughout evaluation, requiring family to provide some information.) Behavior During Therapy: WFL for tasks assessed/performed Overall Cognitive Status: Within Functional Limits for tasks assessed                                 General  Comments: Follows directions consistently      General Comments      Exercises     Assessment/Plan    PT Assessment Patient needs continued PT services  PT Problem List Decreased strength;Cardiopulmonary status limiting activity;Decreased activity tolerance;Decreased balance;Decreased mobility       PT Treatment Interventions DME instruction;Therapeutic exercise;Gait training;Balance training;Stair training;Functional mobility training;Therapeutic activities;Patient/family education    PT Goals (Current goals can be found in the Care Plan section)  Acute Rehab PT Goals PT Goal Formulation: Patient unable to participate in goal setting    Frequency Min 2X/week   Barriers to discharge        Co-evaluation               AM-PAC PT "6 Clicks" Mobility  Outcome Measure Help needed turning from your back to your side while in a flat bed without using bedrails?: A Lot Help needed moving from lying on your back to sitting on the side of a flat bed without using bedrails?: A Lot Help needed moving to and from a bed to a chair (including a wheelchair)?: A Lot Help needed standing up from a chair using your arms (e.g., wheelchair or bedside chair)?: A Lot Help needed to walk in hospital room?: Total Help needed climbing 3-5 steps with a railing? : Total 6 Click Score: 10    End of Session   Activity Tolerance: Patient limited by pain;Patient limited by fatigue(O2 desat to 78% in sitting) Patient left: in bed;with family/visitor present;with call bell/phone within reach Nurse Communication: Mobility status PT Visit Diagnosis: Muscle weakness (generalized) (M62.81);History of falling (Z91.81);Difficulty in walking, not elsewhere classified (R26.2)    Time: 3646-8032 PT Time Calculation (min) (ACUTE ONLY): 26 min   Charges:   PT Evaluation $PT Eval Low Complexity: 1 Low         Glenetta Hew, PT, DPT   Glenetta Hew 10/09/2018, 2:11 PM

## 2018-10-09 NOTE — Clinical Social Work Note (Signed)
Clinical Social Worker (CSW) received consult for "gradual weakness." CSW staffed with EDP Dr. Jimmye Norman. CSW met with patient and family at bedside. Patient from home and lives with wife, daughter, son, and daughter-in-law. Patient in ED due to increased weakness. Patient's preference is to remain in the home and "get stronger." [facility] is a [facility] and goes to the center every Tuesday and Thursday. CSW discussed home health services and short-term rehab in a [place] (SNF). CSW explained a physical therapy will evaluate patient here and [facility] would have to give authorization for SNF placement, if recommended. Patient and family expressed understanding.   CSW updated EDP Dr. Jimmye Norman, who stated he's spoken with Dr. Lemmie Evens. [facility] on-call provider. CSW spoke with Dr. Wilford Grist, who stated she just spoke with patient about discharging home today and having a family meeting at [facility] tomorrow (Tuesday) to discuss additional support and next care steps needed. CSW confirmed with EDP Dr. Jimmye Norman. CSW updated patient, who is agreeable to discharge plan. Family stating patient will need EMS transport home. CSW updated EDRN [place] of discharge plan and need for EMS transport. CSW signing off as no further Social Work needs identified.   Oretha Ellis, Latanya Presser, [place] Worker-Emergency Department [phone]

## 2018-10-09 NOTE — ED Notes (Signed)
Physical therapy states they will come complete PT.

## 2018-10-09 NOTE — ED Provider Notes (Signed)
Case was discussed with the [facility] on-call who will make arrangements for outpatient placement.   Emily Filbert, MD 10/09/18 1311

## 2018-11-19 NOTE — Progress Notes (Signed)
Patient's Name: James Friedman  MRN: [id number]  Referring Provider: Gareth Morgan, MD  DOB: Mar 23, 1946  PCP: Gareth Morgan, MD  DOS: 11/20/2018  Note by: Gaspar Cola, MD  Service setting: Ambulatory outpatient  Specialty: Interventional Pain Management  Location: [facility] (AMB) Pain Management Facility    Patient type: Established   Primary Reason(s) for Visit: Evaluation of chronic illnesses with exacerbation, or progression (Level of risk: moderate) CC: Back Pain (lower)  HPI  Mr. Pedley is a 73 y.o. year old, male patient, who comes today for a follow-up evaluation. He has Hyperlipidemia; Chronic shoulder pain (Bilateral) (L>R); Non-traumatic rotator cuff tear; Chronic pain syndrome; Chronic low back pain (Primary Source of Pain) (Bilateral) (L>R); Lumbosacral radiculopathy at S1 (Right side); Lumbar facet syndrome (Bilateral) (L>R); Chronic neck pain (Bilateral) (R>L); Chronic cervical radicular pain (Bilateral) (R>L); Cervical spondylosis; Morbid obesity ([place]); Long term current use of opiate analgesic; Long term prescription opiate use; Opiate use (60 MME/Day); Opiate dependence ([place]); Chronic sacroiliac joint pain (Bilateral) (R>L); Failed neck surgery syndrome; Chronic upper extremity pain (Bilateral) (R>L); History of bilateral knee surgery; Diffuse myofascial pain syndrome; Lumbar facet hypertrophy (L4-5 and L5-S1); Carpal tunnel syndrome on right (Released); Transient weakness of left lower extremity; At high risk for falls; 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); Polyneuropathy; Osteoarthritis of knee (Bilateral); Osteoarthritis of shoulder (Bilateral); Chronic shoulder Arthropathy (Bilateral); Cervical myofascial pain syndrome (Left trapezius); Primary osteoarthritis of left knee; Primary osteoarthritis of right knee; Suprapatellar bursitis of right knee; Chronic lumbar radiculopathy; Lumbar spinal stenosis (L3-4); Polypharmacy; Abnormal MRI, lumbar spine (07/21/17); DDD (degenerative disc disease), lumbar; DDD (degenerative disc disease), cervical; Cervicalgia; Morbid obesity with BMI of 40.0-44.9, adult (HCC); and L2-3 Disc Extrusion w/ radiculopathy on their problem list. Mr. Bachmeier was last seen on Visit date not found. His primarily concern today is the Back Pain (lower)  Pain Assessment: Location: Lower Back Radiating: both legs to the feet Onset: More than a month ago Duration: Chronic pain Quality: Sharp Severity: 10-Worst pain ever/10 (subjective, self-reported pain score)  Note: Reported level is inconsistent with clinical observations. Clinically the patient looks like a 4/10 A 4/10 is viewed as "Moderately Severe" and described as impossible to ignore for more than a few minutes. With effort, patients may still be able to manage work or participate in some social activities. Very difficult to concentrate. Signs of autonomic nervous system discharge are evident: dilated pupils (mydriasis); mild sweating (diaphoresis); sleep interference. Heart rate becomes elevated (>115 bpm). Diastolic blood pressure (lower number) rises above 100 mmHg. Patients find relief in laying down and not moving. Information on the proper use of the pain scale provided to the patient today. When using our objective Pain Scale, levels between 6 and 10/10 are said to belong in an emergency room, as it progressively worsens from a 6/10, described as severely  limiting, requiring emergency care not usually available at an outpatient pain management facility. At a 6/10 level, communication becomes difficult and requires great effort. Assistance to reach the emergency department may be required. Facial flushing and profuse sweating along with potentially dangerous increases in  heart rate and blood pressure will be evident. Timing: Constant Modifying factors: nothing BP: 119/84  HR: (!) 102  The patient returns to the clinic after last timing seen around 04/06/2018 when he completed his series of 5 bilateral intra-articular Hyalgan knee injections and at the same time he also had a left-sided L2-3 lumbar epidural steroid injection under fluoroscopic guidance.  This apparently provided him with significant relief of the pain until recently when he started experiencing sharp shooting pains in the lumbar spine, which he described as unrelated to spasms or muscle cramping.  This would suggest the pain to be neurogenic/neuropathic in nature.  The best treatment for this would be to use membrane stabilizer such as gabapentin (Neurontin) or pregabalin (Lyrica).  As it turns out, he is currently on Lyrica 50 mg p.o. twice daily.  He indicates that he was recently decreased from 3 times daily.  My recommendation would be to do the opposite.  I would recommend to begin increasing the Lyrica, as tolerated, to a maximum of 450 mg/day (150 mg 3 times daily).  The patient indicates having lost significant weight, which would be critical towards getting him better.  The goal here is to have him below a BMI of 30.  According to the patient, his current weight is 251 pounds which would put him at a BMI of 37.  However, I really doubt that this is the case.  Unfortunately, because of his current status we were unable to weight him today.  The patient also indicated having suffered several falls suggesting significant weakness.  He recently had an EMG completed which apparently was  positive for diabetic sensorimotor polyneuropathy as well as having evidence of a left L5/S1 chronic lumbosacral radiculopathy.  Today he describes his pain as being in the lower portion of his back with occasional shooting pains into the groin area and pain and weakness in both lower extremities going all the way down to the bottom of his feet, suggesting an S1 radiculopathy, bilaterally.  Because the biggest component of his pain is in the lower back, this would suggest the pain to be coming from the L2-3 extrusion versus a lower lumbar facet syndrome.  However, because the lumbar facet syndrome would not give him radicular symptoms, this is more likely to be secondary to the intraspinal pathology in the upper lumbar region.  Based on this, I will have the patient return for a lumbar L2-3 interlaminar LESI.  Further details on both, my assessment(s), as well as the proposed treatment plan, please see below.  Laboratory Chemistry  Inflammation Markers (CRP: Acute Phase) (ESR: Chronic Phase) Lab Results  Component Value Date   CRP 1.3 03/28/2017   ESRSEDRATE 3 03/28/2017                         Rheumatology Markers No results found.  Renal Function Markers Lab Results  Component Value Date   BUN 15 10/09/2018   CREATININE 0.79 10/09/2018   BCR 24 03/28/2017   GFRAA >60 10/09/2018   GFRNONAA >60 10/09/2018                             Hepatic Function Markers Lab Results  Component Value Date   AST 29 10/09/2018   ALT 24 10/09/2018   ALBUMIN 3.9 10/09/2018   ALKPHOS 84 10/09/2018  Electrolytes Lab Results  Component Value Date   NA 135 10/09/2018   K 4.8 10/09/2018   CL 98 10/09/2018   CALCIUM 9.0 10/09/2018   MG 1.8 03/28/2017                        Neuropathy Markers Lab Results  Component Value Date   VITAMINB12 255 03/28/2017                        CNS Tests No results found.  Bone Pathology Markers Lab Results  Component Value Date    25OHVITD1 35 03/28/2017   25OHVITD2 14 03/28/2017   25OHVITD3 21 03/28/2017                         Coagulation Parameters Lab Results  Component Value Date   PLT 300 10/09/2018                        Cardiovascular Markers Lab Results  Component Value Date   TROPONINI <0.03 10/09/2018   HGB 13.4 10/09/2018   HCT 43.3 10/09/2018                         CA Markers No results found.  Endocrine Markers No results found.  Note: Lab results reviewed.  Imaging Review  Cervical Imaging: Cervical MR wo contrast:  Results for orders placed during the hospital encounter of 10/07/07  MR Cervical Spine Wo Contrast   Narrative Clinical Data:  Neck and bilateral shoulder pain with right arm pain.  MRI CERVICAL SPINE WITHOUT CONTRAST: Technique:  Multiplanar and multiecho pulse sequences of the cervical spine, to include the craniocervical junction and cervicothoracic junction, were obtained according to standard protocol without IV contrast. Comparison:  No prior images are available.  Report of MR 07/18/02 reviewed.   Findings:  Vertebral body height, signal, and alignment are maintained.  Patient has a congenitally narrow central canal.  The craniocervical junction is normal and cervical cord signal is normal.  C2-3:  Facet degenerative change worst on the left with mild left foraminal narrowing.  Central canal and right foraminal narrowing are open with mild disc bulging noted.  C3-4:  Left worse than right vertebral disease with a mild broad based disc bulge.  Disc effaces the ventral thecal sac and just contacts the right side of the cord.  There is moderate left and mild right foraminal narrowing.   C4-5:  Disc osteophyte complex with uncovertebral disease is present.  Disc effaces the ventral thecal sac and slightly deforms the cord.  There is biforaminal narrowing somewhat more notable on the left.  C5-6:  There is disc osteophyte complex eccentric to the right where a focal  protrusion contacts and slightly indents the right hemicord.  Mild to moderate biforaminal narrowing noted.  C6-7:  Disc osteophyte complex slightly deflects and flattens the ventral cord.  There is biforaminal narrowing. C7-T1:  Mild disc bulge without signal canal or foraminal narrowing.  IMPRESSION: Spondylosis of the cervical spine as detailed above.  Disc disease causes deformity of the cord most notably from C4-5 to C6-7 as detailed above.  Lesser degree of mild ventral cord flattening is seen at C3-4.  Provider: Mancel Bale   Cervical MR w/wo contrast:  Results for orders placed in visit on 07/18/02  MR Cervical Spine W Wo Contrast   Narrative  FINDINGS CLINICAL DATA:  73 YEAR OLD WITH NECK AND BILATERAL ARM PAIN. MRI CERVICAL SPINE WITHOUT AND WITH CONTRAST: MULTIPLANAR MULTISEQUENCE IMAGING WAS PERFORMED PRE AND POST ADMINISTRATION OF TOTAL OF 20 CC OF OMNISCAN. THE PATIENT HAS A HISTORY OF A PREVIOUS C6-7 DISCECTOMY IN 1990.  CHRONIC NECK AND BILATERAL SHOULDER PAIN. THE PATIENT WAS QUITE LARGE AND THE CERVICAL COLLAR JUST BARELY FIT AROUND THE PATIENT'S NECK. THERE IS ALSO SOME BREATHING MOTION ARTIFACT.  THE SAGITTAL IMAGES ARE LIMITED.  THERE IS GROSSLY NORMAL ALIGNMENT ON THE SAGITTAL IMAGES.  VERTEBRAL BODIES DEMONSTRATE GROSSLY NORMAL MARROW SIGNAL.  CERVICAL SPINAL CORD DEMONSTRATES NORMAL SIGNAL INTENSITY.  NO CHIARI MALFORMATION IS SEEN. THE AXIAL IMAGES ARE BETTER QUALITY AND FINDINGS AT INDIVIDUAL LEVELS ARE AS FOLLOWS: C3-4:  OSTEOPHYTIC SPURRING ALONG WITH A LEFT MEDIAL FORAMINAL DISC PROTRUSION AND A DIFFUSE BULGING ANNULUS CREATES MASS EFFECT ON THE THECAL SAC AND LEFT FORAMINAL STENOSIS. C4-5:  MILD OSTEOPHYTIC RIDGING AND A SLIGHT BULGING ANNULUS WITH MILD IMPRESSION ON THE THECAL SAC.  NO FOCAL DISC PROTRUSION.  THERE IS MILD BONY FORAMINAL NARROWING BILATERALLY. C5-6:  OSTEOPHYTIC RIDGING ALONG WITH EITHER A FOCAL SPUR OR RIGHT PARACENTRAL DISC  PROTRUSION WITH MORE FOCAL MASS EFFECT ON THE ANTERIOR THECAL SAC.  THERE IS MILD FORAMINAL ENCROACHMENT BILATERALLY. C6-7:  COMBINATION OF OSTEOPHYTIC SPURRING AND A MODERATE TO LARGE LEFT PARACENTRAL DISC PROTRUSION.  THERE IS SIGNIFICANT MASS EFFECT ON THE LEFT SIDE OF THE THECAL SAC AND THE LEFT C7 NERVE ROOT.  THERE IS BILATERAL BONY FORAMINAL NARROWING ALSO. C7-T1 DEMONSTRATES NO SIGNIFICANT FINDINGS. IMPRESSION MULTILEVEL DISC DISEASE AS SPECIFICALLY DESCRIBED ABOVE.  MOST SIGNIFICANT FINDINGS ARE AT C3-4, C5- 6 AND C6-7.   Lumbosacral Imaging: Lumbar MR wo contrast:  Results for orders placed during the hospital encounter of 07/21/17  MR LUMBAR SPINE WO CONTRAST   Narrative CLINICAL DATA:  Back pain. Injection last month which did little to help.  EXAM: MRI LUMBAR SPINE WITHOUT CONTRAST  TECHNIQUE: Multiplanar, multisequence MR imaging of the lumbar spine was performed. No intravenous contrast was administered.  COMPARISON:  07/25/2015  FINDINGS: Segmentation:  Standard  Alignment:  Grade 1 anterolisthesis at L4-5, facet mediated.  Vertebrae: No fracture, evidence of discitis, or aggressive bone lesion. T12 hemangioma.  Conus medullaris: Extends to the L1 level and appears normal.  Paraspinal and other soft tissues: Negative  Disc levels:  T12- L1: Unremarkable.  L1-L2: Spondylosis with posterior element hypertrophy. No impingement  L2-L3: Superiorly migrating disc extrusion that is newly seen. The disc nearly reaches the L2 superior endplate. Moderate spinal stenosis. Mild posterior element hypertrophy. Patent foramina.  L3-L4: Advanced degenerative disc narrowing with bulging and endplate spurring. There is endplate sclerosis. Degenerated hypertrophic facets. Moderate bilateral foraminal narrowing. Moderate spinal stenosis.  L4-L5: Advanced facet arthropathy with grade 1 anterolisthesis. Mild narrowing of the disc. There is a left far-lateral disc  protrusion which contacts and could irritate the L4 nerve root, chronic. Left subarticular recess stenosis with visible L5 impingement.  L5-S1:Degenerative facet arthropathy with asymmetric hypertrophy on the right. Disc narrowing with chronic right paracentral protrusion contacting the right S1 nerve root.  IMPRESSION: 1. Study compared to 2016. 2. New L2-3 superiorly migrating disc extrusion causing moderate spinal stenosis. 3. L3-4 advanced disc and facet degeneration with moderate spinal stenosis. Moderate bilateral foraminal narrowing. 4. L4-5 advanced facet arthropathy with anterolisthesis. Left subarticular recess stenosis with left L5 impingement. Chronic left extraforaminal protrusion with L4 impingement. 5. L5-S1 chronic right paracentral protrusion with right S1 contact but  no mass-effect.   Electronically Signed   By: Monte Fantasia M.D.   On: 07/21/2017 14:18    Lumbar DG 2-3 views:  Results for orders placed during the hospital encounter of 10/09/18  DG Lumbar Spine 2-3 Views   Narrative CLINICAL DATA:  Mid to low back pain radiating down both legs after fall on Thanksgiving.  EXAM: LUMBAR SPINE - 2-3 VIEW  COMPARISON:  MRI 07/21/2017  FINDINGS: Vertebral body alignment and heights are normal. There is moderate spondylosis of the lumbar spine. Severe disc space narrowing at the L3-4 level with moderate disc space narrowing at the L2-3 level. Mild disc space narrowing at the L4-5 and L5-S1 levels. Mild symmetric degenerative change of the hips.  IMPRESSION: Moderate spondylosis of the lumbar spine with severe disc disease at the L3-4 level with moderate disc disease at the L2-3 level. Mild disc disease at the L4-5 and L5-S1 levels.   Electronically Signed   By: Marin Olp M.D.   On: 10/09/2018 10:43    Knee Imaging: Knee-R MR wo contrast:  Results for orders placed during the hospital encounter of 12/04/15  MR Knee Right Wo Contrast    Narrative CLINICAL DATA:  Chronic right knee pain.  EXAM: MRI OF THE RIGHT KNEE WITHOUT CONTRAST  TECHNIQUE: Multiplanar, multisequence MR imaging of the knee was performed. No intravenous contrast was administered.  COMPARISON:  None.  FINDINGS: MENISCI  Medial meniscus: Severe attenuation of the medial meniscus which may reflect prior meniscectomy versus severe tear.  Lateral meniscus: Increased signal in the anterior horn of the lateral meniscus likely reflecting degeneration.  LIGAMENTS  Cruciates: Intact PCL. Complete ACL tear without osseous abnormality likely reflecting a chronic ACL tear.  Collaterals: Medial collateral ligament is intact. Lateral collateral ligament complex is intact.  CARTILAGE  Patellofemoral: Partial thickness cartilage loss of the medial and lateral patellofemoral compartments.  Medial: Extensive full-thickness cartilage loss of the medial femoral condyle and medial tibial plateau.  Lateral: Chondromalacia with fissuring of the lateral tibial plateau.  Joint: Small joint effusion. Normal Hoffa's fat. No plical thickening.  Popliteal Fossa:  Large Baker cyst.  Intact popliteus tendon.  Extensor Mechanism: Mild tendinosis of the proximal patellar tendon. Intact quadriceps tendon.  Bones: No focal marrow signal abnormality. No fracture or dislocation. Tricompartmental marginal osteophytes.  IMPRESSION: 1. Severe attenuation of the medial meniscus which may reflect prior meniscectomy versus severe tear. 2. Increased signal in the anterior horn of the lateral meniscus likely reflecting degeneration. 3. Tricompartmental cartilage abnormalities as described above. 4. Chronic ACL tear.   Electronically Signed   By: Kathreen Devoid   On: 12/04/2015 16:16    Complexity Note: Imaging results reviewed. Results shared with Mr. Hoffer, using Layman's terms.                         Meds   Current Outpatient Medications:  .   acetaminophen (TYLENOL) 325 MG tablet, Take 650 mg by mouth as needed. , Disp: , Rfl:  .  albuterol (PROAIR HFA) 108 (90 BASE) MCG/ACT inhaler, Inhale 2 puffs into the lungs every 6 (six) hours as needed for wheezing., Disp: , Rfl:  .  atorvastatin (LIPITOR) 80 MG tablet, Take 80 mg by mouth daily., Disp: , Rfl:  .  cetirizine (ZYRTEC) 10 MG tablet, Take 10 mg by mouth daily., Disp: , Rfl:  .  CHOLECALCIFEROL PO, Take 1,000 Units by mouth daily., Disp: , Rfl:  .  diclofenac sodium (VOLTAREN)  1 % GEL, Apply topically 2 (two) times daily as needed., Disp: , Rfl:  .  DULoxetine (CYMBALTA) 30 MG capsule, Take 30 mg by mouth 2 (two) times daily. One in a.m., two at night, Disp: , Rfl:  .  glucagon (GLUCAGON EMERGENCY) 1 MG injection, Inject 1 mg into the vein once as needed., Disp: , Rfl:  .  insulin aspart (NOVOLOG) 100 UNIT/ML injection, Inject 70 Units into the skin 3 (three) times daily with meals. , Disp: , Rfl:  .  insulin detemir (LEVEMIR) 100 UNIT/ML injection, Inject 90 Units into the skin 2 (two) times daily. , Disp: , Rfl:  .  insulin glargine (LANTUS) 100 UNIT/ML injection, Inject 10 Units into the skin at bedtime., Disp: , Rfl:  .  lisinopril (PRINIVIL,ZESTRIL) 10 MG tablet, Take 10 mg by mouth daily., Disp: , Rfl:  .  Magnesium 400 MG TABS, Take 1 tablet by mouth 2 (two) times daily. , Disp: , Rfl:  .  meloxicam (MOBIC) 7.5 MG tablet, Take 7.5 mg by mouth daily., Disp: , Rfl:  .  metoprolol (LOPRESSOR) 100 MG tablet, Take 100 mg by mouth daily. , Disp: , Rfl:  .  mometasone (NASONEX) 50 MCG/ACT nasal spray, Place 2 sprays into the nose daily., Disp: , Rfl:  .  Multiple Vitamin (MULTIVITAMIN) tablet, Take 1 tablet by mouth daily., Disp: , Rfl:  .  naloxone (NARCAN) nasal spray 4 mg/0.1 mL, Place 1 spray into the nose., Disp: , Rfl:  .  naproxen (NAPROSYN) 500 MG tablet, Take 500 mg by mouth 2 (two) times daily with a meal., Disp: , Rfl:  .  omeprazole (PRILOSEC) 20 MG capsule, Take 20 mg  by mouth daily., Disp: , Rfl:  .  OXYGEN, Inhale 3 L into the lungs at bedtime., Disp: , Rfl:  .  pregabalin (LYRICA) 50 MG capsule, Take 50 mg by mouth 2 (two) times daily., Disp: , Rfl:  .  senna (SENOKOT) 8.6 MG TABS tablet, Take 2 tablets by mouth at bedtime., Disp: , Rfl:  .  sitaGLIPtin-metformin (JANUMET) 50-1000 MG tablet, Take 1 tablet by mouth 2 (two) times daily with a meal., Disp: , Rfl:  .  tamsulosin (FLOMAX) 0.4 MG CAPS capsule, Take 0.4 mg by mouth daily after supper., Disp: , Rfl:  .  terbinafine (LAMISIL) 1 % cream, Apply 1 application topically 2 (two) times daily., Disp: , Rfl:  .  testosterone cypionate (DEPOTESTOTERONE CYPIONATE) 200 MG/ML injection, Inject 200 mg into the muscle every 14 (fourteen) days., Disp: , Rfl:  .  traMADol (ULTRAM) 50 MG tablet, Take by mouth every 8 (eight) hours as needed., Disp: , Rfl:  .  traZODone (DESYREL) 150 MG tablet, 150 mg daily. , Disp: , Rfl:   ROS  Constitutional: Denies any fever or chills Gastrointestinal: No reported hemesis, hematochezia, vomiting, or acute GI distress Musculoskeletal: Denies any acute onset joint swelling, redness, loss of ROM, or weakness Neurological: No reported episodes of acute onset apraxia, aphasia, dysarthria, agnosia, amnesia, paralysis, loss of coordination, or loss of consciousness  Allergies  Mr. Poynter has No Known Allergies.  PFSH  Drug: Mr. Cu  reports no history of drug use. Alcohol:  reports no history of alcohol use. Tobacco:  reports that he has quit smoking. His smoking use included cigarettes. He has a 3.75 pack-year smoking history. His smokeless tobacco use includes chew. Medical:  has a past medical history of Anxiety, Asthma, Atypical chest pain, Back pain, BPH (benign prostatic hypertrophy), Carpal tunnel  syndrome, Carpal tunnel syndrome on right (07/17/2015), Chest pain (07/26/2013), DDD (degenerative disc disease), Diabetes mellitus without complication ([place]), Essential  hypertension, Falls, GERD (gastroesophageal reflux disease), Heme positive stool, History of bilateral knee surgery (07/17/2015), Hyperlipidemia, Hypertension, Morbid obesity ([place]), OA (osteoarthritis), OSA (obstructive sleep apnea), Ventral hernia, and Wears dentures. Surgical: Mr. Minney  has a past surgical history that includes Knee surgery (Bilateral); Neck surgery; Carpal tunnel release; Shoulder surgery (Right); Cataract extraction, extracapsular (Left, 08/04/2015); and Cataract extraction w/PHACO (Right, 04/19/2016). Family: family history includes Heart attack in his father.  Constitutional Exam  General appearance: Well nourished, well developed, and well hydrated. In no apparent acute distress Vitals:   11/20/18 1049  BP: 119/84  Pulse: (!) 102  Resp: 18  Temp: 98.1 F (36.7 C)  TempSrc: Oral  SpO2: 99%  Weight: 251 lb (113.9 kg)  Height: 5' 9" (1.753 m)   BMI Assessment: Estimated body mass index is 37.07 kg/m as calculated from the following:   Height as of this encounter: 5' 9" (1.753 m).   Weight as of this encounter: 251 lb (113.9 kg).  BMI interpretation table: BMI level Category Range association with higher incidence of chronic pain  <18 kg/m2 Underweight   18.5-24.9 kg/m2 Ideal body weight   25-29.9 kg/m2 Overweight Increased incidence by 20%  30-34.9 kg/m2 Obese (Class I) Increased incidence by 68%  35-39.9 kg/m2 Severe obesity (Class II) Increased incidence by 136%  >40 kg/m2 Extreme obesity (Class III) Increased incidence by 254%   Patient's current BMI Ideal Body weight  Body mass index is 37.07 kg/m. Ideal body weight: 70.7 kg (155 lb 13.8 oz) Adjusted ideal body weight: 88 kg (193 lb 14.7 oz)   BMI Readings from Last 4 Encounters:  11/20/18 37.07 kg/m  10/09/18 41.94 kg/m  04/06/18 41.64 kg/m  03/02/18 41.64 kg/m   Wt Readings from Last 4 Encounters:  11/20/18 251 lb (113.9 kg)  10/09/18 284 lb (128.8 kg)  04/06/18 282 lb (127.9 kg)   03/02/18 282 lb (127.9 kg)  Psych/Mental status: Alert, oriented x 3 (person, place, & time)       Eyes: PERLA Respiratory: No evidence of acute respiratory distress  Cervical Spine Area Exam  Skin & Axial Inspection: No masses, redness, edema, swelling, or associated skin lesions Alignment: Symmetrical Functional ROM: Unrestricted ROM      Stability: No instability detected Muscle Tone/Strength: Functionally intact. No obvious neuro-muscular anomalies detected. Sensory (Neurological): Unimpaired Palpation: No palpable anomalies              Upper Extremity (UE) Exam    Side: Right upper extremity  Side: Left upper extremity  Skin & Extremity Inspection: Skin color, temperature, and hair growth are WNL. No peripheral edema or cyanosis. No masses, redness, swelling, asymmetry, or associated skin lesions. No contractures.  Skin & Extremity Inspection: Skin color, temperature, and hair growth are WNL. No peripheral edema or cyanosis. No masses, redness, swelling, asymmetry, or associated skin lesions. No contractures.  Functional ROM: Unrestricted ROM          Functional ROM: Unrestricted ROM          Muscle Tone/Strength: Functionally intact. No obvious neuro-muscular anomalies detected.  Muscle Tone/Strength: Functionally intact. No obvious neuro-muscular anomalies detected.  Sensory (Neurological): Unimpaired          Sensory (Neurological): Unimpaired          Palpation: No palpable anomalies              Palpation: No  palpable anomalies              Provocative Test(s):  Phalen's test: deferred Tinel's test: deferred Apley's scratch test (touch opposite shoulder):  Action 1 (Across chest): deferred Action 2 (Overhead): deferred Action 3 (LB reach): deferred   Provocative Test(s):  Phalen's test: deferred Tinel's test: deferred Apley's scratch test (touch opposite shoulder):  Action 1 (Across chest): deferred Action 2 (Overhead): deferred Action 3 (LB reach): deferred     Thoracic Spine Area Exam  Skin & Axial Inspection: No masses, redness, or swelling Alignment: Symmetrical Functional ROM: Unrestricted ROM Stability: No instability detected Muscle Tone/Strength: Functionally intact. No obvious neuro-muscular anomalies detected. Sensory (Neurological): Unimpaired Muscle strength & Tone: No palpable anomalies  Lumbar Spine Area Exam  Skin & Axial Inspection: No masses, redness, or swelling Alignment: Symmetrical Functional ROM: Decreased ROM affecting both sides Stability: No instability detected Muscle Tone/Strength: Guarding observed Sensory (Neurological): Movement-associated pain Palpation: Complains of area being tender to palpation       Provocative Tests: Hyperextension/rotation test: Equivocal       Lumbar quadrant test (Kemp's test): deferred today       Lateral bending test: deferred today       Patrick's Maneuver: deferred today                   FABER* test: deferred today                   S-I anterior distraction/compression test: deferred today         S-I lateral compression test: deferred today         S-I Thigh-thrust test: deferred today         S-I Gaenslen's test: deferred today         *(Flexion, ABduction and External Rotation)  Gait & Posture Assessment  Ambulation: Patient came in today in a wheel chair Gait: Significantly limited. Dependent on assistive device to ambulate Posture: Antalgic   Lower Extremity Exam    Side: Right lower extremity  Side: Left lower extremity  Stability: No instability observed          Stability: No instability observed          Skin & Extremity Inspection: Skin color, temperature, and hair growth are WNL. No peripheral edema or cyanosis. No masses, redness, swelling, asymmetry, or associated skin lesions. No contractures.  Skin & Extremity Inspection: Skin color, temperature, and hair growth are WNL. No peripheral edema or cyanosis. No masses, redness, swelling, asymmetry, or associated  skin lesions. No contractures.  Functional ROM: Unrestricted ROM                  Functional ROM: Unrestricted ROM                  Muscle Tone/Strength: Deconditioned  Muscle Tone/Strength: Deconditioned  Sensory (Neurological): Dermatomal pain pattern bottom & lateral aspect of foot (S1)  Sensory (Neurological): Dermatomal pain pattern bottom & lateral aspect of foot (S1)  DTR: Patellar: deferred today Achilles: deferred today Plantar: deferred today  DTR: Patellar: deferred today Achilles: deferred today Plantar: deferred today  Palpation: No palpable anomalies  Palpation: No palpable anomalies   Assessment   Status Diagnosis  Worsened Worsening Persistent 1. Chronic low back pain (Primary Source of Pain) (Bilateral) (L>R)   2. Chronic lower extremity pain (Secondary source of pain) (Bilateral) (R>L)   3. 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 Lumbar spinal stenosis (L3-4)  5. 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)   6. DDD (degenerative disc disease), lumbar   7. 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 New L2-3 superiorly migrating disc extrusion causing moderate spinal stenosis. 2. L3-4 advanced disc and facet degeneration with moderate spinal stenosis. Moderate bilateral foraminal narrowing. 3. L4-5 advanced facet arthropathy with anterolisthesis. Left subarticular recess stenosis with left L5 impingement. Chronic left extraforaminal protrusion with L4 impingement. 4. L5-S1 chronic right paracentral protrusion with right S1 contact but no mass-effect.   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   Morbid Obesity With Bmi of 40.0-44.9, Adult ([facility])    Plan of Care  Pharmacotherapy (Medications Ordered): No orders of the defined types were placed in this encounter.  Medications administered today: Mitzie Na. Headrick had no medications administered during this visit.  Orders:  Orders Placed This Encounter  Procedures  . Lumbar Epidural Injection    Standing Status:   Future    Standing Expiration Date:   12/21/2018    Scheduling Instructions:     Procedure: Interlaminar Lumbar Epidural Steroid injection (LESI)  L2-3     Laterality: Left-sided     Sedation: None required.     Timeframe: ASAP    Order Specific Question:   Where will this procedure be performed?    Answer:   [facility]   Lab Orders  No laboratory test(s) ordered today   Imaging Orders  No imaging studies ordered today   Referral Orders  No referral(s) requested today   Interventional management options: Planned follow-up:   NOTE: NO RFA until BMI <35. The patient needs to bring his BMI below 35 so that we can consider the possibility of repeating his lumbar facet RFA. Plan: Return for Procedure (no sedation): (L) L2-3 LESI #1.   Considering:   Diagnostic bilateral suprascapular nerve block #2 Diagnostic right intra-articular shoulder injection #2 Diagnostic bilateral genicular nerve block #2  Possible repeat bilateral genicular RFA #2    Palliative PRN treatment(s):   Palliative bilateral lumbar facet block #3  Palliative right  CESI #3  Palliative right L2-3 interlaminar LESI #2  Palliative right-sided L2 transforaminal LESI #2  Palliative bilateral Hyalgan series #3 (last completed on 03/27/2018) Palliative bilateral lumbar facet RFA #2 (Right done 07/08/2016; 10/02/15; Left 12/09/2015) (NO RFA until BMI <35)   No future appointments. Primary Care Physician: Gareth Morgan, MD Location: [facility] Note by: Gaspar Cola, MD Date: 11/20/2018; Time: 12:44 PM

## 2018-11-20 ENCOUNTER — Ambulatory Visit: Payer: Medicare (Managed Care) | Attending: Pain Medicine | Admitting: Pain Medicine

## 2018-11-20 ENCOUNTER — Encounter: Payer: Self-pay | Admitting: Pain Medicine

## 2018-11-20 ENCOUNTER — Other Ambulatory Visit: Payer: Self-pay

## 2018-11-20 VITALS — BP 119/84 | HR 102 | Temp 98.1°F | Resp 18 | Ht 69.0 in | Wt 251.0 lb

## 2018-11-20 DIAGNOSIS — M48062 Spinal stenosis, lumbar region with neurogenic claudication: Secondary | ICD-10-CM | POA: Diagnosis not present

## 2018-11-20 DIAGNOSIS — M48061 Spinal stenosis, lumbar region without neurogenic claudication: Secondary | ICD-10-CM | POA: Insufficient documentation

## 2018-11-20 DIAGNOSIS — G8929 Other chronic pain: Secondary | ICD-10-CM

## 2018-11-20 DIAGNOSIS — M545 Low back pain: Secondary | ICD-10-CM | POA: Insufficient documentation

## 2018-11-20 DIAGNOSIS — M79605 Pain in left leg: Secondary | ICD-10-CM | POA: Diagnosis present

## 2018-11-20 DIAGNOSIS — M5116 Intervertebral disc disorders with radiculopathy, lumbar region: Secondary | ICD-10-CM | POA: Insufficient documentation

## 2018-11-20 DIAGNOSIS — M79604 Pain in right leg: Secondary | ICD-10-CM | POA: Diagnosis present

## 2018-11-20 DIAGNOSIS — Z6841 Body Mass Index (BMI) 40.0 and over, adult: Secondary | ICD-10-CM

## 2018-11-20 DIAGNOSIS — M5136 Other intervertebral disc degeneration, lumbar region: Secondary | ICD-10-CM | POA: Diagnosis present

## 2018-11-20 DIAGNOSIS — R937 Abnormal findings on diagnostic imaging of other parts of musculoskeletal system: Secondary | ICD-10-CM

## 2018-11-20 NOTE — Patient Instructions (Addendum)
____________________________________________________________________________________________  Preparing for Procedure with Sedation  Instructions: . Oral Intake: Do not eat or drink anything for at least 8 hours prior to your procedure. . Transportation: Public transportation is not allowed. Bring an adult driver. The driver must be physically present in our waiting room before any procedure can be started. . Physical Assistance: Bring an adult physically capable of assisting you, in the event you need help. This adult should keep you company at home for at least 6 hours after the procedure. . Blood Pressure Medicine: Take your blood pressure medicine with a sip of water the morning of the procedure. . Blood thinners: Notify our staff if you are taking any blood thinners. Depending on which one you take, there will be specific instructions on how and when to stop it. . Diabetics on insulin: Notify the staff so that you can be scheduled 1st case in the morning. If your diabetes requires high dose insulin, take only  of your normal insulin dose the morning of the procedure and notify the staff that you have done so. . Preventing infections: Shower with an antibacterial soap the morning of your procedure. . Build-up your immune system: Take 1000 mg of Vitamin C with every meal (3 times a day) the day prior to your procedure. . Antibiotics: Inform the staff if you have a condition or reason that requires you to take antibiotics before dental procedures. . Pregnancy: If you are pregnant, call and cancel the procedure. . Sickness: If you have a cold, fever, or any active infections, call and cancel the procedure. . Arrival: You must be in the facility at least 30 minutes prior to your scheduled procedure. . Children: Do not bring children with you. . Dress appropriately: Bring dark clothing that you would not mind if they get stained. . Valuables: Do not bring any jewelry or valuables.  Procedure  appointments are reserved for interventional treatments only. . No Prescription Refills. . No medication changes will be discussed during procedure appointments. . No disability issues will be discussed.  Reasons to call and reschedule or cancel your procedure: (Following these recommendations will minimize the risk of a serious complication.) . Surgeries: Avoid having procedures within 2 weeks of any surgery. (Avoid for 2 weeks before or after any surgery). . Flu Shots: Avoid having procedures within 2 weeks of a flu shots or . (Avoid for 2 weeks before or after immunizations). . Barium: Avoid having a procedure within 7-10 days after having had a radiological study involving the use of radiological contrast. (Myelograms, Barium swallow or enema study). . Heart attacks: Avoid any elective procedures or surgeries for the initial 6 months after a "Myocardial Infarction" (Heart Attack). . Blood thinners: It is imperative that you stop these medications before procedures. Let us know if you if you take any blood thinner.  . Infection: Avoid procedures during or within two weeks of an infection (including chest colds or gastrointestinal problems). Symptoms associated with infections include: Localized redness, fever, chills, night sweats or profuse sweating, burning sensation when voiding, cough, congestion, stuffiness, runny nose, sore throat, diarrhea, nausea, vomiting, cold or Flu symptoms, recent or current infections. It is specially important if the infection is over the area that we intend to treat. . Heart and lung problems: Symptoms that may suggest an active cardiopulmonary problem include: cough, chest pain, breathing difficulties or shortness of breath, dizziness, ankle swelling, uncontrolled high or unusually low blood pressure, and/or palpitations. If you are experiencing any of these symptoms, cancel   your procedure and contact your primary care physician for an evaluation.  Remember:   Regular Business hours are:  Monday to Thursday 8:00 AM to 4:00 PM  Provider's Schedule: Delano Metz, MD:  Procedure days: Tuesday and Thursday 7:30 AM to 4:00 PM  Edward Jolly, MD:  Procedure days: Monday and Wednesday 7:30 AM to 4:00 PM ____________________________________________________________________________________________   ____________________________________________________________________________________________  Weight Management Required  URGENT: Your weight has been found to be adversely affecting your health.  Dear Mr. Bernheim:  Your current Body mass index is 37.07 kg/m.Marland Kitchen Estimated body mass index is 37.07 kg/m as calculated from the following:   Height as of this encounter: 5\' 9"  (1.753 m).   Weight as of this encounter: 251 lb (113.9 kg).  Your last four (4) weight and BMI calculations are as follows: Wt Readings from Last 4 Encounters:  11/20/18 251 lb (113.9 kg)  10/09/18 284 lb (128.8 kg)  04/06/18 282 lb (127.9 kg)  03/02/18 282 lb (127.9 kg)   BMI Readings from Last 4 Encounters:  11/20/18 37.07 kg/m  10/09/18 41.94 kg/m  04/06/18 41.64 kg/m  03/02/18 41.64 kg/m    Calculations estimate your ideal body weight to be: Ideal body weight: 70.7 kg (155 lb 13.8 oz) Adjusted ideal body weight: 88 kg (193 lb 14.7 oz)  Please use the table below to identify your weight category and associated incidence of chronic pain, secondary to your weight.  BMI interpretation table: BMI level Category Associated incidence of chronic pain  <18 kg/m2 Underweight   18.5-24.9 kg/m2 Ideal body weight   25-29.9 kg/m2 Overweight  20%  30-34.9 kg/m2 Obese (Class I)  68%  35-39.9 kg/m2 Severe obesity (Class II)  136%  >40 kg/m2 Extreme obesity (Class III)  254%   In addition: You will be considered "Morbidly Obese", if your BMI is above 30 and you have one or more of the following conditions which are known to be directly associated with obesity: 1. Type 2  Diabetes (Which in turn can lead to cardiovascular diseases (CVD), stroke, peripheral vascular diseases (PVD), retinopathy, nephropathy, and neuropathy) 2. Cardiovascular Disease (High Blood Pressure; Congestive Heart Failure; High Cholesterol; Coronary Artery Disease; Angina; or History of Heart Attacks) 3. Breathing problems (Asthma; obesity-hypoventilation syndrome; obstructive sleep apnea; chronic inflammatory airway disease; reactive airway disease; or shortness of breath) 4. Chronic kidney disease 5. Liver disease (nonalcoholic fatty liver disease) 6. High blood pressure 7. Acid reflux (gastroesophageal reflux disease; heartburn) 8. Osteoarthritis (OA) (with any of the following: hip pain; knee pain; and/or low back pain) 9. Low back pain (Lumbar Facet Syndrome; and/or Degenerative Disc Disease) 10. Hip pain (Osteoarthritis of hip) (For every 1 lbs of added body weight, there is a 2 lbs increase in pressure inside of each hip articulation. 1:2 mechanical relationship) 11. Knee pain (Osteoarthritis of knee) (For every 1 lbs of added body weight, there is a 4 lbs increase in pressure inside of each knee articulation. 1:4 mechanical relationship) (patients with a BMI>30 kg/m2 were 6.8 times more likely to develop knee OA than normal-weight individuals) 12. Certain types of cancer. (Epidemiological studies have shown that obesity is a risk factor for: post-menopausal breast cancer; cancers of the endometrium, colon and kidney cancer; malignant adenomas of the oesophagus. Obese subjects have an approximately 1.5-3.5-fold increased risk of developing these cancers compared with normal-weight subjects, and it has been estimated that between 15 and 45% of these cancers can be attributed to overweight. More recent studies suggest that obesity may also increase  the risk of other types of cancer, including pancreatic, hepatic and gallbladder cancer. Ref: Obesity and cancer. Pischon T, Nthlings U, [employer] H.  Proc Nutr Soc. 2008 May;67(2):128-45. doi: 10.1017/[id number].)  Recommendation: At this point it is urgent that you take a step back and concentrate in loosing weight. Dedicate 100% of your efforts on this task. Nothing else will improve your health more than bringing down your BMI to less than 30. Because most chronic pain patients do have difficulty exercising secondary to their pain, you must rely on proper nutrition and dieting in order to lose the weight. If your BMI is above 40, you should seriously consider bariatric surgery. A realistic goal is to lose 10% of your body weight over a period of 12 months.  If over time you have unsuccessfully try to lose weight, then it is time for you to seek professional help and to enter a medically supervised weight management program.  Pain management considerations:  1. Pharmacological Problems: Be advised that the use of opioid analgesics (oxycodone; hydrocodone; morphine; methadone; codeine; and all of their derivatives) have been associated with decreased metabolism and weight gain.  For this reason, should we see that you are unable to lose weight while taking these medications, it may become necessary for [place] to taper down and indefinitely discontinue them.  2. Technical Problems: The incidence of successful interventional therapies decreases as the patient's BMI increases. It is much more difficult to accomplish a safe and effective interventional therapy on a patient with a BMI above 35. Yours is Body mass index is 37.07 kg/m.Marland Kitchen  3. Radiation Exposure Problems: The x-rays machine, used to accomplish injection therapies, will automatically increase their x-ray output in order to capture an appropriate bone image. This means that radiation exposure increases exponentially with the patient's BMI. (The higher the BMI, the higher the radiation exposure.) Although the level of radiation used at a given time is still safe to the patient, it is not for the  physician and/or assisting staff. Unfortunately, radiation exposure is accumulative. Because physicians and the staff have to do procedures and be exposed on a daily basis, this can result in health problems such as cancer and radiation burns. Radiation exposure to the staff is monitored by the radiation batches that they wear. The exposure levels are reported back to the staff on a quarterly basis. Depending on levels of exposure, physicians and staff may be obligated by law to decrease this exposure. This means that they have the right and obligation to refuse providing therapies where they may be overexposed to radiation. For this reason, physicians may decline to offer therapies such as radiofrequency ablation or implants to patients with a BMI above 40. 4. Current Trends: Be advised that the current trend is to no longer offer certain therapies to patients with a BMI equal to, or above 35, due to increase perioperative risks, increased technical procedural difficulties, and excessive radiation exposure to healthcare personnel. ____________________________________________________________________________________________   ______________________________________________________________________________________________  Specialty Pain Scale  Introduction:  There are significant differences in how pain is reported. The word pain usually refers to physical pain, but it is also a common synonym of suffering. The medical community uses a scale from 0 (zero) to 10 (ten) to report pain level. Zero (0) is described as "no pain", while ten (10) is described as "the worse pain you can imagine". The problem with this scale is that physical pain is reported along with suffering. Suffering refers to mental pain, or more often yet  it refers to any unpleasant feeling, emotion or aversion associated with the perception of harm or threat of harm. It is the psychological component of pain.  Pain Specialists prefer to  separate the two components. The pain scale used by this practice is the Verbal Numerical Rating Scale (VNRS-11). This scale is for the physical pain only. DO NOT INCLUDE how your pain psychologically affects you. This scale is for adults 26 years of age and older. It has 11 (eleven) levels. The 1st level is 0/10. This means: "right now, I have no pain". In the context of pain management, it also means: "right now, my physical pain is under control with the current therapy".  General Information:  The scale should reflect your current level of pain. Unless you are specifically asked for the level of your worst pain, or your average pain. If you are asked for one of these two, then it should be understood that it is over the past 24 hours.  Levels 1 (one) through 5 (five) are described below, and can be treated as an outpatient. Ambulatory pain management facilities such as ours are more than adequate to treat these levels. Levels 6 (six) through 10 (ten) are also described below, however, these must be treated as a hospitalized patient. While levels 6 (six) and 7 (seven) may be evaluated at an urgent care facility, levels 8 (eight) through 10 (ten) constitute medical emergencies and as such, they belong in a hospital's emergency department. When having these levels (as described below), do not come to our office. Our facility is not equipped to manage these levels. Go directly to an urgent care facility or an emergency department to be evaluated.  Definitions:  Activities of Daily Living (ADL): Activities of daily living (ADL or ADLs) is a term used in healthcare to refer to people's daily self-care activities. Health professionals often use a person's ability or inability to perform ADLs as a measurement of their functional status, particularly in regard to people post injury, with disabilities and the elderly. There are two ADL levels: Basic and Instrumental. Basic Activities of Daily Living (BADL  or  BADLs) consist of self-care tasks that include: Bathing and showering; personal hygiene and grooming (including brushing/combing/styling hair); dressing; Toilet hygiene (getting to the toilet, cleaning oneself, and getting back up); eating and self-feeding (not including cooking or chewing and swallowing); functional mobility, often referred to as "transferring", as measured by the ability to walk, get in and out of bed, and get into and out of a chair; the broader definition (moving from one place to another while performing activities) is useful for people with different physical abilities who are still able to get around independently. Basic ADLs include the things many people do when they get up in the morning and get ready to go out of the house: get out of bed, go to the toilet, bathe, dress, groom, and eat. On the average, loss of function typically follows a particular order. Hygiene is the first to go, followed by loss of toilet use and locomotion. The last to go is the ability to eat. When there is only one remaining area in which the person is independent, there is a 62.9% chance that it is eating and only a 3.5% chance that it is hygiene. Instrumental Activities of Daily Living (IADL or IADLs) are not necessary for fundamental functioning, but they let an individual live independently in a community. IADL consist of tasks that include: cleaning and maintaining the house; home establishment  and maintenance; care of others (including selecting and supervising caregivers); care of pets; child rearing; managing money; managing financials (investments, etc.); meal preparation and cleanup; shopping for groceries and necessities; moving within the community; safety procedures and emergency responses; health management and maintenance (taking prescribed medications); and using the telephone or other form of communication.  Instructions:  Most patients tend to report their pain as a combination of two  factors, their physical pain and their psychosocial pain. This last one is also known as "suffering" and it is reflection of how physical pain affects you socially and psychologically. From now on, report them separately.  From this point on, when asked to report your pain level, report only your physical pain. Use the following table for reference.  Pain Clinic Pain Levels (0-5/10)  Pain Level Score  Description  No Pain 0   Mild pain 1 Nagging, annoying, but does not interfere with basic activities of daily living (ADL). Patients are able to eat, bathe, get dressed, toileting (being able to get on and off the toilet and perform personal hygiene functions), transfer (move in and out of bed or a chair without assistance), and maintain continence (able to control bladder and bowel functions). Blood pressure and heart rate are unaffected. A normal heart rate for a healthy adult ranges from 60 to 100 bpm (beats per minute).   Mild to moderate pain 2 Noticeable and distracting. Impossible to hide from other people. More frequent flare-ups. Still possible to adapt and function close to normal. It can be very annoying and may have occasional stronger flare-ups. With discipline, patients may get used to it and adapt.   Moderate pain 3 Interferes significantly with activities of daily living (ADL). It becomes difficult to feed, bathe, get dressed, get on and off the toilet or to perform personal hygiene functions. Difficult to get in and out of bed or a chair without assistance. Very distracting. With effort, it can be ignored when deeply involved in activities.   Moderately severe pain 4 Impossible to ignore for more than a few minutes. With effort, patients may still be able to manage work or participate in some social activities. Very difficult to concentrate. Signs of autonomic nervous system discharge are evident: dilated pupils (mydriasis); mild sweating (diaphoresis); sleep interference. Heart rate  becomes elevated (>115 bpm). Diastolic blood pressure (lower number) rises above 100 mmHg. Patients find relief in laying down and not moving.   Severe pain 5 Intense and extremely unpleasant. Associated with frowning face and frequent crying. Pain overwhelms the senses.  Ability to do any activity or maintain social relationships becomes significantly limited. Conversation becomes difficult. Pacing back and forth is common, as getting into a comfortable position is nearly impossible. Pain wakes you up from deep sleep. Physical signs will be obvious: pupillary dilation; increased sweating; goosebumps; brisk reflexes; cold, clammy hands and feet; nausea, vomiting or dry heaves; loss of appetite; significant sleep disturbance with inability to fall asleep or to remain asleep. When persistent, significant weight loss is observed due to the complete loss of appetite and sleep deprivation.  Blood pressure and heart rate becomes significantly elevated. Caution: If elevated blood pressure triggers a pounding headache associated with blurred vision, then the patient should immediately seek attention at an urgent or emergency care unit, as these may be signs of an impending stroke.    Emergency Department Pain Levels (6-10/10)  Emergency Room Pain 6 Severely limiting. Requires emergency care and should not be seen or managed at an  outpatient pain management facility. Communication becomes difficult and requires great effort. Assistance to reach the emergency department may be required. Facial flushing and profuse sweating along with potentially dangerous increases in heart rate and blood pressure will be evident.   Distressing pain 7 Self-care is very difficult. Assistance is required to transport, or use restroom. Assistance to reach the emergency department will be required. Tasks requiring coordination, such as bathing and getting dressed become very difficult.   Disabling pain 8 Self-care is no longer  possible. At this level, pain is disabling. The individual is unable to do even the most "basic" activities such as walking, eating, bathing, dressing, transferring to a bed, or toileting. Fine motor skills are lost. It is difficult to think clearly.   Incapacitating pain 9 Pain becomes incapacitating. Thought processing is no longer possible. Difficult to remember your own name. Control of movement and coordination are lost.   The worst pain imaginable 10 At this level, most patients pass out from pain. When this level is reached, collapse of the autonomic nervous system occurs, leading to a sudden drop in blood pressure and heart rate. This in turn results in a temporary and dramatic drop in blood flow to the brain, leading to a loss of consciousness. Fainting is one of the body's self defense mechanisms. Passing out puts the brain in a calmed state and causes it to shut down for a while, in order to begin the healing process.    Summary: 13. Refer to this scale when providing [place] with your pain level. 14. Be accurate and careful when reporting your pain level. This will help with your care. 15. Over-reporting your pain level will lead to loss of credibility. 16. Even a level of 1/10 means that there is pain and will be treated at our facility. 17. High, inaccurate reporting will be documented as "Symptom Exaggeration", leading to loss of credibility and suspicions of possible secondary gains such as obtaining more narcotics, or wanting to appear disabled, for fraudulent reasons. 18. Only pain levels of 5 or below will be seen at our facility. 19. Pain levels of 6 and above will be sent to the Emergency Department and the appointment cancelled. ______________________________________________________________________________________________   ____________________________________________________________________________________________  Muscle Spasms & Cramps  Cause:  The most common cause of  muscle spasms and cramps is vitamin and/or electrolyte (calcium, potassium, sodium, etc.) deficiencies.  Possible triggers: Sweating - causes loss of electrolytes thru the skin. Steroids - causes loss of electrolytes thru the urine.  Treatment: 20. Gatorade (or any other electrolyte-replenishing drink) - Take 1, 8 oz glass with each meal (3 times a day). 21. OTC (over-the-counter) Magnesium 400 to 500 mg - Take 1 tablet twice a day (one with breakfast and one before bedtime). If you have kidney problems, talk to your primary care physician before taking any Magnesium. 22. Tonic Water with quinine - Take 1, 8 oz glass before bedtime.   ____________________________________________________________________________________________   ____________________________________________________________________________________________  Preparing for your procedure (without sedation)  Instructions: . Oral Intake: Do not eat or drink anything for at least 3 hours prior to your procedure. . Transportation: Unless otherwise stated by your physician, you may drive yourself after the procedure. . Blood Pressure Medicine: Take your blood pressure medicine with a sip of water the morning of the procedure. . Blood thinners: Notify our staff if you are taking any blood thinners. Depending on which one you take, there will be specific instructions on how and when to stop it. Marland Kitchen  Diabetics on insulin: Notify the staff so that you can be scheduled 1st case in the morning. If your diabetes requires high dose insulin, take only  of your normal insulin dose the morning of the procedure and notify the staff that you have done so. . Preventing infections: Shower with an antibacterial soap the morning of your procedure.  . Build-up your immune system: Take 1000 mg of Vitamin C with every meal (3 times a day) the day prior to your procedure. Marland Kitchen Antibiotics: Inform the staff if you have a condition or reason that requires you to  take antibiotics before dental procedures. . Pregnancy: If you are pregnant, call and cancel the procedure. . Sickness: If you have a cold, fever, or any active infections, call and cancel the procedure. . Arrival: You must be in the facility at least 30 minutes prior to your scheduled procedure. . Children: Do not bring any children with you. . Dress appropriately: Bring dark clothing that you would not mind if they get stained. . Valuables: Do not bring any jewelry or valuables.  Procedure appointments are reserved for interventional treatments only. Marland Kitchen No Prescription Refills. . No medication changes will be discussed during procedure appointments. . No disability issues will be discussed.  Reasons to call and reschedule or cancel your procedure: (Following these recommendations will minimize the risk of a serious complication.) . Surgeries: Avoid having procedures within 2 weeks of any surgery. (Avoid for 2 weeks before or after any surgery). . Flu Shots: Avoid having procedures within 2 weeks of a flu shots or . (Avoid for 2 weeks before or after immunizations). . Barium: Avoid having a procedure within 7-10 days after having had a radiological study involving the use of radiological contrast. (Myelograms, Barium swallow or enema study). . Heart attacks: Avoid any elective procedures or surgeries for the initial 6 months after a "Myocardial Infarction" (Heart Attack). . Blood thinners: It is imperative that you stop these medications before procedures. Let us know if you if you take any blood thinner.  . Infection: Avoid procedures during or within two weeks of an infection (including chest colds or gastrointestinal problems). Symptoms associated with infections include: Localized redness, fever, chills, night sweats or profuse sweating, burning sensation when voiding, cough, congestion, stuffiness, runny nose, sore throat, diarrhea, nausea, vomiting, cold or Flu symptoms, recent or current  infections. It is specially important if the infection is over the area that we intend to treat. Marland Kitchen Heart and lung problems: Symptoms that may suggest an active cardiopulmonary problem include: cough, chest pain, breathing difficulties or shortness of breath, dizziness, ankle swelling, uncontrolled high or unusually low blood pressure, and/or palpitations. If you are experiencing any of these symptoms, cancel your procedure and contact your primary care physician for an evaluation.  Remember:  Regular Business hours are:  Monday to Thursday 8:00 AM to 4:00 PM  Provider's Schedule: Delano Metz, MD:  Procedure days: Tuesday and Thursday 7:30 AM to 4:00 PM  Edward Jolly, MD:  Procedure days: Monday and Wednesday 7:30 AM to 4:00 PM ____________________________________________________________________________________________

## 2018-11-20 NOTE — Progress Notes (Signed)
Safety precautions to be maintained throughout the outpatient stay will include: orient to surroundings, keep bed in low position, maintain call bell within reach at all times, provide assistance with transfer out of bed and ambulation.  

## 2018-11-30 ENCOUNTER — Ambulatory Visit: Payer: Medicare (Managed Care) | Admitting: Pain Medicine

## 2018-12-05 ENCOUNTER — Encounter: Payer: Self-pay | Admitting: Pain Medicine

## 2018-12-05 ENCOUNTER — Other Ambulatory Visit: Payer: Self-pay

## 2018-12-05 ENCOUNTER — Ambulatory Visit (HOSPITAL_BASED_OUTPATIENT_CLINIC_OR_DEPARTMENT_OTHER): Payer: Medicare (Managed Care) | Admitting: Pain Medicine

## 2018-12-05 ENCOUNTER — Ambulatory Visit
Admission: RE | Admit: 2018-12-05 | Discharge: 2018-12-05 | Disposition: A | Discharge status: Home / Self Care | Payer: Medicare (Managed Care) | Source: Ambulatory Visit | Attending: Pain Medicine | Admitting: Pain Medicine

## 2018-12-05 VITALS — BP 134/92 | HR 114 | Temp 98.6°F | Resp 20 | Ht 69.0 in | Wt 251.0 lb

## 2018-12-05 DIAGNOSIS — M48062 Spinal stenosis, lumbar region with neurogenic claudication: Secondary | ICD-10-CM | POA: Diagnosis present

## 2018-12-05 DIAGNOSIS — M4310 Spondylolisthesis, site unspecified: Secondary | ICD-10-CM

## 2018-12-05 DIAGNOSIS — M48061 Spinal stenosis, lumbar region without neurogenic claudication: Secondary | ICD-10-CM | POA: Insufficient documentation

## 2018-12-05 DIAGNOSIS — M79604 Pain in right leg: Secondary | ICD-10-CM | POA: Diagnosis present

## 2018-12-05 DIAGNOSIS — M545 Low back pain, unspecified: Secondary | ICD-10-CM

## 2018-12-05 DIAGNOSIS — M5136 Other intervertebral disc degeneration, lumbar region: Secondary | ICD-10-CM | POA: Insufficient documentation

## 2018-12-05 DIAGNOSIS — G8929 Other chronic pain: Secondary | ICD-10-CM | POA: Diagnosis present

## 2018-12-05 DIAGNOSIS — M5417 Radiculopathy, lumbosacral region: Secondary | ICD-10-CM | POA: Insufficient documentation

## 2018-12-05 DIAGNOSIS — M5416 Radiculopathy, lumbar region: Secondary | ICD-10-CM | POA: Diagnosis present

## 2018-12-05 DIAGNOSIS — M79605 Pain in left leg: Secondary | ICD-10-CM

## 2018-12-05 DIAGNOSIS — M5116 Intervertebral disc disorders with radiculopathy, lumbar region: Secondary | ICD-10-CM | POA: Diagnosis present

## 2018-12-05 DIAGNOSIS — M47816 Spondylosis without myelopathy or radiculopathy, lumbar region: Secondary | ICD-10-CM

## 2018-12-05 MED ORDER — IOPAMIDOL (ISOVUE-M 200) INJECTION 41%
10.0000 mL | Freq: Once | INTRAMUSCULAR | Status: AC
  Administered 2018-12-05: 10 mL via EPIDURAL
  Filled 2018-12-05: qty 10

## 2018-12-05 MED ORDER — TRIAMCINOLONE ACETONIDE 40 MG/ML IJ SUSP
40.0000 mg | Freq: Once | INTRAMUSCULAR | Status: AC
  Administered 2018-12-05: 40 mg
  Filled 2018-12-05: qty 1

## 2018-12-05 MED ORDER — LIDOCAINE HCL 2 % IJ SOLN
20.0000 mL | Freq: Once | INTRAMUSCULAR | Status: AC
  Administered 2018-12-05: 400 mg
  Filled 2018-12-05: qty 40

## 2018-12-05 MED ORDER — ROPIVACAINE HCL 2 MG/ML IJ SOLN
2.0000 mL | Freq: Once | INTRAMUSCULAR | Status: AC
  Administered 2018-12-05: 2 mL via EPIDURAL
  Filled 2018-12-05: qty 10

## 2018-12-05 MED ORDER — SODIUM CHLORIDE 0.9% FLUSH
2.0000 mL | Freq: Once | INTRAVENOUS | Status: AC
  Administered 2018-12-05: 2 mL

## 2018-12-05 MED ORDER — SODIUM CHLORIDE (PF) 0.9 % IJ SOLN
INTRAMUSCULAR | Status: AC
  Filled 2018-12-05: qty 10

## 2018-12-05 NOTE — Progress Notes (Signed)
Patient unable to tolerate procedure, stopped.  Patient very anxious, diaphoretic.  Removed O2 tubing and pulse oximetery.  Patient reports feeling very tired.  Hoyer lift reapplied and patient successfully transported back into motorized wheelchair and taken to recovery room. By the end of transfer reports that he is feeling better.

## 2018-12-05 NOTE — Progress Notes (Signed)
Patient's Name: James Friedman  MRN: [id number]  Referring Provider: Delano Metz, MD  DOB: June 05, 1946  PCP: Bobbye Morton, MD  DOS: 12/05/2018  Note by: Oswaldo Done, MD  Service setting: Ambulatory outpatient  Specialty: Interventional Pain Management  Patient type: Established  Location: [facility] (AMB) Pain Management Facility  Visit type: Interventional Procedure   Primary Reason for Visit: Interventional Pain Management Treatment. CC: Back Pain (lower)  Procedure:          Anesthesia, Analgesia, Anxiolysis:  Type: Therapeutic Inter-Laminar Epidural Steroid Injection  #1  Region: Lumbar Level: L2-3 Level. Laterality: Left-Sided Paramedial  Type: Local Anesthesia Indication(s): Analgesia         Route: Infiltration (Cole/IM) IV Access: Declined Sedation: Declined  Local Anesthetic: Lidocaine 1-2%  Position: Sitting   Indications: 1. DDD (degenerative disc disease), lumbar   2. L2-3 Disc Extrusion w/ radiculopathy   3. Grade 1 Anterolisthesis of L4 over L5   4. 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 Lumbar spinal stenosis (L3-4)   6. Lumbar spondylosis   7. Lumbosacral radiculopathy at S1 (Right side)   8. Chronic lumbar radicular pain (Bilateral) (L>R) (S1 Dermatome on the left)   9. Chronic lumbar radiculopathy   10. Chronic lower extremity pain (Secondary source of pain) (Bilateral) (L>R)   11. Chronic low back pain (Primary Source of Pain) (Bilateral) (L>R)    Pain Score: Pre-procedure: 10-Worst pain ever/10 Post-procedure: 7 /10  Pre-op Assessment:  James Friedman is a 73 y.o. (year old), male patient, seen today for interventional treatment. He  has a past surgical history that includes Knee surgery (Bilateral); Neck surgery; Carpal tunnel release; Shoulder surgery (Right); Cataract extraction, extracapsular (Left, 08/04/2015); and Cataract extraction w/PHACO (Right, 04/19/2016). James Friedman has a current medication list which includes the  following prescription(s): acetaminophen, albuterol, atorvastatin, cetirizine, cholecalciferol, diclofenac sodium, duloxetine, glucagon, insulin aspart, insulin detemir, insulin glargine, lisinopril, magnesium, meloxicam, metoprolol tartrate, mometasone, multivitamin, naloxone, naproxen, omeprazole, oxygen-helium, pregabalin, senna, sitagliptin-metformin, tamsulosin, terbinafine, testosterone cypionate, tramadol, and trazodone. His primarily concern today is the Back Pain (lower)  Initial Vital Signs:  Pulse/HCG Rate: (!) 114ECG Heart Rate: (!) 116 Temp: 98.6 F (37 C) Resp: 16 BP: 90/60 SpO2: 100 %  BMI: Estimated body mass index is 37.07 kg/m as calculated from the following:   Height as of this encounter: 5\' 9"  (1.753 m).   Weight as of this encounter: 251 lb (113.9 kg).  Risk Assessment: Allergies: Reviewed. He has No Known Allergies.  Allergy Precautions: None required Coagulopathies: Reviewed. None identified.  Blood-thinner therapy: None at this time Active Infection(s): Reviewed. None identified. James Friedman is afebrile  Site Confirmation: James Friedman was asked to confirm the procedure and laterality before marking the site Procedure checklist: Completed Consent: Before the procedure and under the influence of no sedative(s), amnesic(s), or anxiolytics, the patient was informed of the treatment options, risks and possible complications. To fulfill our ethical and legal obligations, as recommended by the American Medical Association's Code of Ethics, I have informed the patient of my clinical impression; the nature and purpose of the treatment or procedure; the risks, benefits, and possible complications of the intervention; the alternatives, including doing nothing; the risk(s) and benefit(s) of the alternative treatment(s) or procedure(s); and the risk(s) and benefit(s) of doing nothing. The patient was provided information about the general risks and possible complications  associated with the procedure. These may include, but are not limited to: failure to achieve desired goals, infection, bleeding,  organ or nerve damage, allergic reactions, paralysis, and death. In addition, the patient was informed of those risks and complications associated to Spine-related procedures, such as failure to decrease pain; infection (i.e.: Meningitis, epidural or intraspinal abscess); bleeding (i.e.: epidural hematoma, subarachnoid hemorrhage, or any other type of intraspinal or peri-dural bleeding); organ or nerve damage (i.e.: Any type of peripheral nerve, nerve root, or spinal cord injury) with subsequent damage to sensory, motor, and/or autonomic systems, resulting in permanent pain, numbness, and/or weakness of one or several areas of the body; allergic reactions; (i.e.: anaphylactic reaction); and/or death. Furthermore, the patient was informed of those risks and complications associated with the medications. These include, but are not limited to: allergic reactions (i.e.: anaphylactic or anaphylactoid reaction(s)); adrenal axis suppression; blood sugar elevation that in diabetics may result in ketoacidosis or comma; water retention that in patients with history of congestive heart failure may result in shortness of breath, pulmonary edema, and decompensation with resultant heart failure; weight gain; swelling or edema; medication-induced neural toxicity; particulate matter embolism and blood vessel occlusion with resultant organ, and/or nervous system infarction; and/or aseptic necrosis of one or more joints. Finally, the patient was informed that Medicine is not an exact science; therefore, there is also the possibility of unforeseen or unpredictable risks and/or possible complications that may result in a catastrophic outcome. The patient indicated having understood very clearly. We have given the patient no guarantees and we have made no promises. Enough time was given to the patient to  ask questions, all of which were answered to the patient's satisfaction. James Friedman has indicated that he wanted to continue with the procedure. Attestation: I, the ordering provider, attest that I have discussed with the patient the benefits, risks, side-effects, alternatives, likelihood of achieving goals, and potential problems during recovery for the procedure that I have provided informed consent. Date   Time: 12/05/2018 12:58 PM  Pre-Procedure Preparation:  Monitoring: As per clinic protocol. Respiration, ETCO2, SpO2, BP, heart rate and rhythm monitor placed and checked for adequate function Safety Precautions: Patient was assessed for positional comfort and pressure points before starting the procedure. Time-out: I initiated and conducted the "Time-out" before starting the procedure, as per protocol. The patient was asked to participate by confirming the accuracy of the "Time Out" information. Verification of the correct person, site, and procedure were performed and confirmed by me, the nursing staff, and the patient. "Time-out" conducted as per Joint Commission's Universal Protocol (UP.01.01.01). Time: 1338  Description of Procedure:          Target Area: The interlaminar space, initially targeting the lower laminar border of the superior vertebral body. Approach: Paramedial approach. Area Prepped: Entire Posterior Lumbar Region Prepping solution: ChloraPrep (2% chlorhexidine gluconate and 70% isopropyl alcohol) Safety Precautions: Aspiration looking for blood return was conducted prior to all injections. At no point did we inject any substances, as a needle was being advanced. No attempts were made at seeking any paresthesias. Safe injection practices and needle disposal techniques used. Medications properly checked for expiration dates. SDV (single dose vial) medications used. Description of the Procedure: Protocol guidelines were followed.  Because of the patient's body habitus and  weakness, we actually had to get a patient Neil Crouch to move him into a sitting position over the procedure table since he could not tolerate being on his stomach.  In addition, the patient was having difficulty sustaining his position, even though he was holding the fluoroscopy machine to stabilize his body.  The entire area  was prepped in the usual manner and the skin infiltrated with the lidocaine.  The epidural needle was then introduced and slowly advance using fluoroscopic guidance, until I contacted the posterior aspect of the lamina.  The depth of the needle was recorded and then redirected towards the interlaminar space.  Loss-of-resistance was used, once the ligamentum flavum was engaged.  Once the loss-of-resistance was obtained, contrast was injected and the spread observed under fluoroscopic guidance.  Unfortunately, it was very difficult to see this spread because of the patient's body habitus.  However, we confirm that there was no CSF return, no heme, and I also injected slowly under fluoroscopic guidance to see if there was any contrast falling into the caudal area, suggesting intrathecal injection.  I also observed no intravascular uptake.  I did observe contrast at the edges of the epidural space, superior to where I was injecting, suggesting good epidural split, bilaterally.  The patient had absolutely no problems up to this point.  I then went on to start injecting the preservative-free normal saline solution plus the ropivacaine and the preservative-free steroid.  I had absolutely no problem injecting the first 2-1/2 cc.  The patient did not experience any pressure or paresthesias.  However, this by the fact that we had not moved, he began to experience a sharp pain in the lower back and he indicated that was the typical spasms that he has been getting.  We tried to tell him not to move until the past, but he indicated that he absolutely needed to move and straighten up and he pleaded to have me  remove the needles like that he could do that safely.  I did and he started moving in such a way as to try to release the spasm.  Through this episode, he became short of breath and he took his oxygen cannula off, became diaphoretic, and simply looked bad.  However, the vital signs remained stable and we did not observe any hypo or hypertension, arrhythmia, or significant changes in pulses.  By this time, since we already had at least half of the medication inside of the epidural space, we completely aborted the procedure.  The patient was then transferred to the recovery area for observation.  Once the spasm was gone, he was back to his normal self.  I informed him that I had not been able to inject the entire amount of medicine and he requested for [place] to bring him back as soon as possible for the purpose of completing that treatment.  I informed him that based on my experience with this particular event, I really did not want to do this again.  He asked me what we needed to do about this and I told him that due to the difficulties to get this procedure done and also the difficulties that I experience trying to get a clear image, I really did not want to do this again until he has lost a significant amount of weight, so as to make it safe for him to transfer to the bed, have the procedure done without requiring a crane to move him, and be able to create ideal conditions to do this in a safe manner.  Vitals:   12/05/18 1337 12/05/18 1342 12/05/18 1347 12/05/18 1351  BP: (!) 124/91 134/83 125/73 (!) 134/92  Pulse:      Resp: Temp:      TempSrc:      SpO2: 95% 97% 99% 99%  Weight:      Height:        Start Time: 1338 hrs. End Time: 1407 hrs.  Materials:  Needle(s) Type: Epidural needle Gauge: 20G Length: 15cm Medication(s): Please see orders for medications and dosing details.  Imaging Guidance (Spinal):          Type of Imaging Technique: Fluoroscopy Guidance  (Spinal) Indication(s): Assistance in needle guidance and placement for procedures requiring needle placement in or near specific anatomical locations not easily accessible without such assistance. Exposure Time: Please see nurses notes. Contrast: Before injecting any contrast, we confirmed that the patient did not have an allergy to iodine, shellfish, or radiological contrast. Once satisfactory needle placement was completed at the desired level, radiological contrast was injected. Contrast injected under live fluoroscopy. No contrast complications. See chart for type and volume of contrast used. Fluoroscopic Guidance: I was personally present during the use of fluoroscopy. "Tunnel Vision Technique" used to obtain the best possible view of the target area. Parallax error corrected before commencing the procedure. "Direction-depth-direction" technique used to introduce the needle under continuous pulsed fluoroscopy. Once target was reached, antero-posterior, oblique, and lateral fluoroscopic projection used confirm needle placement in all planes. Images permanently stored in EMR.    Interpretation: I personally interpreted the imaging intraoperatively. Adequate needle placement confirmed in multiple planes. Appropriate spread of contrast into desired area was observed. No evidence of afferent or efferent intravascular uptake. No intrathecal or subarachnoid spread observed. Permanent images saved into the patient's record.  Unfortunately, because of the patient's size, despite the fact that I was using the fluoroscopic guidance at its maximum output, it was very difficult to see the spine and the appropriate landmarks, due to his body habitus.  Because of the lack of clarity, it took me a good while to safely get the needle to the appropriate place making sure that I first touched on os before redirecting, since it was very difficult to tell the location of the tip of the needle until I got to the lamina.   Once inside of the epidural space I did inject the contrast, which again was difficult to interpret the spread due to his morbid obesity.  We did confirm adequate needle placement, unfortunately as we were halfway into injecting his medication, he had a spasm of his back muscles, which terminated the procedure.  Antibiotic Prophylaxis:   Anti-infectives (From admission, onward)   None     Indication(s): None identified  Post-operative Assessment:  Post-procedure Vital Signs:  Pulse/HCG Rate: (!) 114(!) 114 Temp: 98.6 F (37 C) Resp: 20 BP: (!) 134/92 SpO2: 99 %  EBL: None  Complications: No immediate post-treatment complications observed by team, or reported by patient.  Unfortunately, during the procedure itself, the patient experienced a spasms of his back, which he indicates are the time that he has at home.  When this occurred, was half way into injecting his epidural medication.  The patient pleaded for me to remove the needle since he had to move and I immediately did so.  This spasm lasted a little longer and then it went away on its own, the same way that it had appeared.  Unfortunately, as I said before, I was unable to inject only half of the medication.  After the patient fully recovered and was ready to go home, he requested that we bring him back as soon as possible to complete the procedure.  However, I had to tell him that I did not think that we were  going to be able to do that again.  Note: The patient tolerated the entire procedure well. A repeat set of vitals were taken after the procedure and the patient was kept under observation following institutional policy, for this type of procedure. Post-procedural neurological assessment was performed, showing return to baseline, prior to discharge. The patient was provided with post-procedure discharge instructions, including a section on how to identify potential problems. Should any problems arise concerning this procedure, the  patient was given instructions to immediately contact us, at any time, without hesitation. In any case, we plan to contact the patient by telephone for a follow-up status report regarding this interventional procedure.  Comments:  No additional relevant information.  Plan of Care  Orders:  Orders Placed This Encounter  Procedures   Lumbar Epidural Injection    Scheduling Instructions:     Procedure: Interlaminar LESI L2-3     Laterality: Left-sided     Sedation: Patient's choice     Timeframe:  Today    Order Specific Question:   Where will this procedure be performed?    Answer:   [facility]   DG C-Arm 1-60 Min-No Report    Intraoperative interpretation by procedural physician at [facility].    Standing Status:   Standing    Number of Occurrences:   1    Order Specific Question:   Reason for exam:    Answer:   Assistance in needle guidance and placement for procedures requiring needle placement in or near specific anatomical locations not easily accessible without such assistance.   Provider attestation of informed consent for procedure/surgical case    I, the ordering provider, attest that I have discussed with the patient the benefits, risks, side effects, alternatives, likelihood of achieving goals and potential problems during recovery for the procedure that I have provided informed consent.   Informed Consent Details: Transcribe to consent form and obtain patient signature    Surgeon: Teren Zurcher A. Laban Emperor, MD    Scheduling Instructions:     Procedure: Lumbar epidural steroid injection under fluoroscopic guidance     Indications: Low back and/or lower extremity pain secondary to lumbar radiculitis   Medications ordered for procedure: Meds ordered this encounter  Medications   iopamidol (ISOVUE-M) 41 % intrathecal injection 10 mL    Must be Myelogram-compatible. If not available, you may substitute with a water-soluble, non-ionic, hypoallergenic,  myelogram-compatible radiological contrast medium.   lidocaine (XYLOCAINE) 2 % (with pres) injection 400 mg   sodium chloride flush (NS) 0.9 % injection 2 mL   ropivacaine (PF) 2 mg/mL (0.2%) (NAROPIN) injection 2 mL   triamcinolone acetonide (KENALOG-40) injection 40 mg   Medications administered: We administered iopamidol, lidocaine, sodium chloride flush, ropivacaine (PF) 2 mg/mL (0.2%), and triamcinolone acetonide.  See the medical record for exact dosing, route, and time of administration.  Disposition: Discharge home  Discharge Date & Time: 12/05/2018; 1413 hrs.   Follow-up plan:   Return for PPE (2 wks) w/ Dr. Laban Emperor.     Future Appointments  Date Time Provider Department Center  12/25/2018  2:00 PM Delano Metz, MD [facility] None   Primary Care Physician: Bobbye Morton, MD Location: [facility] Note by: Oswaldo Done, MD Date: 12/05/2018; Time: 4:08 PM  Disclaimer:  Medicine is not an Visual merchandiser. The only guarantee in medicine is that nothing is guaranteed. It is important to note that the decision to proceed with this intervention was based on the information  collected from the patient. The Data and conclusions were drawn from the patient's questionnaire, the interview, and the physical examination. Because the information was provided in large part by the patient, it cannot be guaranteed that it has not been purposely or unconsciously manipulated. Every effort has been made to obtain as much relevant data as possible for this evaluation. It is important to note that the conclusions that lead to this procedure are derived in large part from the available data. Always take into account that the treatment will also be dependent on availability of resources and existing treatment guidelines, considered by other Pain Management Practitioners as being common knowledge and practice, at the time of the intervention. For Medico-Legal  purposes, it is also important to point out that variation in procedural techniques and pharmacological choices are the acceptable norm. The indications, contraindications, technique, and results of the above procedure should only be interpreted and judged by a Board-Certified Interventional Pain Specialist with extensive familiarity and expertise in the same exact procedure and technique.

## 2018-12-05 NOTE — Patient Instructions (Addendum)
____________________________________________________________________________________________  Post-Procedure Discharge Instructions  Instructions:  Apply ice:   Purpose: This will minimize any swelling and discomfort after procedure.   When: Day of procedure, as soon as you get home.  How: Fill a plastic sandwich bag with crushed ice. Cover it with a small towel and apply to injection site.  How long: (15 min on, 15 min off) Apply for 15 minutes then remove x 15 minutes.  Repeat sequence on day of procedure, until you go to bed.  Apply heat:   Purpose: To treat any soreness and discomfort from the procedure.  When: Starting the next day after the procedure.  How: Apply heat to procedure site starting the day following the procedure.  How long: May continue to repeat daily, until discomfort goes away.  Food intake: Start with clear liquids (like water) and advance to regular food, as tolerated.   Physical activities: Keep activities to a minimum for the first 8 hours after the procedure. After that, then as tolerated.  Driving: If you have received any sedation, be responsible and do not drive. You are not allowed to drive for 24 hours after having sedation.  Blood thinner: (Applies only to those taking blood thinners) You may restart your blood thinner 6 hours after your procedure.  Insulin: (Applies only to Diabetic patients taking insulin) As soon as you can eat, you may resume your normal dosing schedule.  Infection prevention: Keep procedure site clean and dry. Shower daily and clean area with soap and water.  Post-procedure Pain Diary: Extremely important that this be done correctly and accurately. Recorded information will be used to determine the next step in treatment. For the purpose of accuracy, follow these rules:  Evaluate only the area treated. Do not report or include pain from an untreated area. For the purpose of this evaluation, ignore all other areas of pain,  except for the treated area.  After your procedure, avoid taking a long nap and attempting to complete the pain diary after you wake up. Instead, set your alarm clock to go off every hour, on the hour, for the initial 8 hours after the procedure. Document the duration of the numbing medicine, and the relief you are getting from it.  Do not go to sleep and attempt to complete it later. It will not be accurate. If you received sedation, it is likely that you were given a medication that may cause amnesia. Because of this, completing the diary at a later time may cause the information to be inaccurate. This information is needed to plan your care.  Follow-up appointment: Keep your post-procedure follow-up evaluation appointment after the procedure (usually 2 weeks for most procedures, 6 weeks for radiofrequencies). DO NOT FORGET to bring you pain diary with you.   Expect: (What should I expect to see with my procedure?)  From numbing medicine (AKA: Local Anesthetics): Numbness or decrease in pain. You may also experience some weakness, which if present, could last for the duration of the local anesthetic.  Onset: Full effect within 15 minutes of injected.  Duration: It will depend on the type of local anesthetic used. On the average, 1 to 8 hours.   From steroids (Applies only if steroids were used): Decrease in swelling or inflammation. Once inflammation is improved, relief of the pain will follow.  Onset of benefits: Depends on the amount of swelling present. The more swelling, the longer it will take for the benefits to be seen. In some cases, up to 10 days.    Duration: Steroids will stay in the system x 2 weeks. Duration of benefits will depend on multiple posibilities including persistent irritating factors.  Side-effects: If present, they may typically last 2 weeks (the duration of the steroids).  Frequent: Cramps (if they occur, drink Gatorade and take over-the-counter Magnesium 450-500 mg  once to twice a day); water retention with temporary weight gain; increases in blood sugar; decreased immune system response; increased appetite.  Occasional: Facial flushing (red, warm cheeks); mood swings; menstrual changes.  Uncommon: Long-term decrease or suppression of natural hormones; bone thinning. (These are more common with higher doses or more frequent use. This is why we prefer that our patients avoid having any injection therapies in other practices.)   Very Rare: Severe mood changes; psychosis; aseptic necrosis.  From procedure: Some discomfort is to be expected once the numbing medicine wears off. This should be minimal if ice and heat are applied as instructed.  Call if: (When should I call?)  You experience numbness and weakness that gets worse with time, as opposed to wearing off.  New onset bowel or bladder incontinence. (Applies only to procedures done in the spine)  Emergency Numbers:  Durning business hours (Monday - Thursday, 8:00 AM - 4:00 PM) (Friday, 9:00 AM - 12:00 Noon): [phone]  After hours: [phone]  NOTE: If you are having a problem and are unable connect with, or to talk to a provider, then go to your nearest urgent care or emergency department. If the problem is serious and urgent, please call 911. ____________________________________________________________________________________________    ______________________________________________________________________________________________  Specialty Pain Scale  Introduction:  There are significant differences in how pain is reported. The word pain usually refers to physical pain, but it is also a common synonym of suffering. The medical community uses a scale from 0 (zero) to 10 (ten) to report pain level. Zero (0) is described as "no pain", while ten (10) is described as "the worse pain you can imagine". The problem with this scale is that physical pain is reported along with suffering.  Suffering refers to mental pain, or more often yet it refers to any unpleasant feeling, emotion or aversion associated with the perception of harm or threat of harm. It is the psychological component of pain.  Pain Specialists prefer to separate the two components. The pain scale used by this practice is the Verbal Numerical Rating Scale (VNRS-11). This scale is for the physical pain only. DO NOT INCLUDE how your pain psychologically affects you. This scale is for adults 21 years of age and older. It has 11 (eleven) levels. The 1st level is 0/10. This means: "right now, I have no pain". In the context of pain management, it also means: "right now, my physical pain is under control with the current therapy".  General Information:  The scale should reflect your current level of pain. Unless you are specifically asked for the level of your worst pain, or your average pain. If you are asked for one of these two, then it should be understood that it is over the past 24 hours.  Levels 1 (one) through 5 (five) are described below, and can be treated as an outpatient. Ambulatory pain management facilities such as ours are more than adequate to treat these levels. Levels 6 (six) through 10 (ten) are also described below, however, these must be treated as a hospitalized patient. While levels 6 (six) and 7 (seven) may be evaluated at an urgent care facility, levels 8 (eight) through 10 (ten)   constitute medical emergencies and as such, they belong in a hospital's emergency department. When having these levels (as described below), do not come to our office. Our facility is not equipped to manage these levels. Go directly to an urgent care facility or an emergency department to be evaluated.  Definitions:  Activities of Daily Living (ADL): Activities of daily living (ADL or ADLs) is a term used in healthcare to refer to people's daily self-care activities. Health professionals often use a person's ability or inability  to perform ADLs as a measurement of their functional status, particularly in regard to people post injury, with disabilities and the elderly. There are two ADL levels: Basic and Instrumental. Basic Activities of Daily Living (BADL  or BADLs) consist of self-care tasks that include: Bathing and showering; personal hygiene and grooming (including brushing/combing/styling hair); dressing; Toilet hygiene (getting to the toilet, cleaning oneself, and getting back up); eating and self-feeding (not including cooking or chewing and swallowing); functional mobility, often referred to as "transferring", as measured by the ability to walk, get in and out of bed, and get into and out of a chair; the broader definition (moving from one place to another while performing activities) is useful for people with different physical abilities who are still able to get around independently. Basic ADLs include the things many people do when they get up in the morning and get ready to go out of the house: get out of bed, go to the toilet, bathe, dress, groom, and eat. On the average, loss of function typically follows a particular order. Hygiene is the first to go, followed by loss of toilet use and locomotion. The last to go is the ability to eat. When there is only one remaining area in which the person is independent, there is a 62.9% chance that it is eating and only a 3.5% chance that it is hygiene. Instrumental Activities of Daily Living (IADL or IADLs) are not necessary for fundamental functioning, but they let an individual live independently in a community. IADL consist of tasks that include: cleaning and maintaining the house; home establishment and maintenance; care of others (including selecting and supervising caregivers); care of pets; child rearing; managing money; managing financials (investments, etc.); meal preparation and cleanup; shopping for groceries and necessities; moving within the community; safety procedures  and emergency responses; health management and maintenance (taking prescribed medications); and using the telephone or other form of communication.  Instructions:  Most patients tend to report their pain as a combination of two factors, their physical pain and their psychosocial pain. This last one is also known as "suffering" and it is reflection of how physical pain affects you socially and psychologically. From now on, report them separately.  From this point on, when asked to report your pain level, report only your physical pain. Use the following table for reference.  Pain Clinic Pain Levels (0-5/10)  Pain Level Score  Description  No Pain 0   Mild pain 1 Nagging, annoying, but does not interfere with basic activities of daily living (ADL). Patients are able to eat, bathe, get dressed, toileting (being able to get on and off the toilet and perform personal hygiene functions), transfer (move in and out of bed or a chair without assistance), and maintain continence (able to control bladder and bowel functions). Blood pressure and heart rate are unaffected. A normal heart rate for a healthy adult ranges from 60 to 100 bpm (beats per minute).   Mild to moderate pain 2 Noticeable   and distracting. Impossible to hide from other people. More frequent flare-ups. Still possible to adapt and function close to normal. It can be very annoying and may have occasional stronger flare-ups. With discipline, patients may get used to it and adapt.   Moderate pain 3 Interferes significantly with activities of daily living (ADL). It becomes difficult to feed, bathe, get dressed, get on and off the toilet or to perform personal hygiene functions. Difficult to get in and out of bed or a chair without assistance. Very distracting. With effort, it can be ignored when deeply involved in activities.   Moderately severe pain 4 Impossible to ignore for more than a few minutes. With effort, patients may still be able to  manage work or participate in some social activities. Very difficult to concentrate. Signs of autonomic nervous system discharge are evident: dilated pupils (mydriasis); mild sweating (diaphoresis); sleep interference. Heart rate becomes elevated (>115 bpm). Diastolic blood pressure (lower number) rises above 100 mmHg. Patients find relief in laying down and not moving.   Severe pain 5 Intense and extremely unpleasant. Associated with frowning face and frequent crying. Pain overwhelms the senses.  Ability to do any activity or maintain social relationships becomes significantly limited. Conversation becomes difficult. Pacing back and forth is common, as getting into a comfortable position is nearly impossible. Pain wakes you up from deep sleep. Physical signs will be obvious: pupillary dilation; increased sweating; goosebumps; brisk reflexes; cold, clammy hands and feet; nausea, vomiting or dry heaves; loss of appetite; significant sleep disturbance with inability to fall asleep or to remain asleep. When persistent, significant weight loss is observed due to the complete loss of appetite and sleep deprivation.  Blood pressure and heart rate becomes significantly elevated. Caution: If elevated blood pressure triggers a pounding headache associated with blurred vision, then the patient should immediately seek attention at an urgent or emergency care unit, as these may be signs of an impending stroke.    Emergency Department Pain Levels (6-10/10)  Emergency Room Pain 6 Severely limiting. Requires emergency care and should not be seen or managed at an outpatient pain management facility. Communication becomes difficult and requires great effort. Assistance to reach the emergency department may be required. Facial flushing and profuse sweating along with potentially dangerous increases in heart rate and blood pressure will be evident.   Distressing pain 7 Self-care is very difficult. Assistance is required to  transport, or use restroom. Assistance to reach the emergency department will be required. Tasks requiring coordination, such as bathing and getting dressed become very difficult.   Disabling pain 8 Self-care is no longer possible. At this level, pain is disabling. The individual is unable to do even the most "basic" activities such as walking, eating, bathing, dressing, transferring to a bed, or toileting. Fine motor skills are lost. It is difficult to think clearly.   Incapacitating pain 9 Pain becomes incapacitating. Thought processing is no longer possible. Difficult to remember your own name. Control of movement and coordination are lost.   The worst pain imaginable 10 At this level, most patients pass out from pain. When this level is reached, collapse of the autonomic nervous system occurs, leading to a sudden drop in blood pressure and heart rate. This in turn results in a temporary and dramatic drop in blood flow to the brain, leading to a loss of consciousness. Fainting is one of the body's self defense mechanisms. Passing out puts the brain in a calmed state and causes it to shut down   for a while, in order to begin the healing process.    Summary: 1.   Refer to this scale when providing us with your pain level. 2.   Be accurate and careful when reporting your pain level. This will help with your care. 3.   Over-reporting your pain level will lead to loss of credibility. 4.   Even a level of 1/10 means that there is pain and will be treated at our facility. 5.   High, inaccurate reporting will be documented as "Symptom Exaggeration", leading to loss of credibility and suspicions of possible secondary gains such as obtaining more narcotics, or wanting to appear disabled, for fraudulent reasons. 6.   Only pain levels of 5 or below will be seen at our facility. 7.   Pain levels of 6 and above will be sent to the Emergency Department and the appointment  cancelled.  ______________________________________________________________________________________________     

## 2018-12-06 ENCOUNTER — Telehealth: Payer: Self-pay

## 2018-12-06 ENCOUNTER — Telehealth: Payer: Self-pay | Admitting: *Deleted

## 2018-12-06 NOTE — Telephone Encounter (Signed)
Patient called and states that he slept well and was doing fine.

## 2018-12-18 ENCOUNTER — Ambulatory Visit: Payer: Medicare (Managed Care) | Attending: Pain Medicine | Admitting: Pain Medicine

## 2018-12-18 ENCOUNTER — Other Ambulatory Visit: Payer: Self-pay

## 2018-12-18 ENCOUNTER — Encounter: Payer: Self-pay | Admitting: Pain Medicine

## 2018-12-18 DIAGNOSIS — G8929 Other chronic pain: Secondary | ICD-10-CM

## 2018-12-18 DIAGNOSIS — M79604 Pain in right leg: Secondary | ICD-10-CM

## 2018-12-18 DIAGNOSIS — M545 Low back pain: Secondary | ICD-10-CM

## 2018-12-18 DIAGNOSIS — M5116 Intervertebral disc disorders with radiculopathy, lumbar region: Secondary | ICD-10-CM

## 2018-12-18 DIAGNOSIS — M79605 Pain in left leg: Secondary | ICD-10-CM

## 2018-12-18 NOTE — Patient Instructions (Signed)

## 2018-12-18 NOTE — Progress Notes (Signed)
Pain Management Encounter Note - Virtual Visit via Telephone Telehealth (real-time audio visits between healthcare provider and patient).  Patient's Phone No.:  [phone] (home); [phone] (mobile); (Preferred) [phone]  Pre-screening note:  Our staff contacted [name] and offered him an "in person", "face-to-face" appointment versus a telephone encounter. He indicated preferring the telephone encounter, at this time.  Reason for Virtual Visit: COVID-19*  Social distancing based on CDC ans AMA recommendations.   I contacted James Friedman on 12/18/2018 at 9:44 AM9:55 AM by telephone and clearly identified myself as Oswaldo Done, MD. I verified that I was speaking with the correct person using two identifiers (Name and date of birth: 03-15-1946).  Advanced Informed Consent I sought verbal advanced consent from James Friedman for telemedicine interactions and virtual visit. I informed James Friedman of the security and privacy concerns, risks, and limitations associated with performing an evaluation and management service by telephone. I also informed James Friedman of the availability of "in person" appointments and I informed him of the possibility of a patient responsible charge related to this service. James Friedman expressed understanding and agreed to proceed.   Historic Elements   [name] is a 73 y.o. year old, male patient evaluated today after his last encounter by our practice on 12/06/2018. [name]  has a past medical history of Anxiety, Asthma, Atypical chest pain, Back pain, BPH (benign prostatic hypertrophy), Carpal tunnel syndrome, Carpal tunnel syndrome on right (07/17/2015), Chest pain (07/26/2013), DDD (degenerative disc disease), Diabetes mellitus without complication (HCC), Essential hypertension, Falls, GERD (gastroesophageal reflux disease), Heme positive stool, History of bilateral knee surgery (07/17/2015), Hyperlipidemia, Hypertension, Morbid obesity  (HCC), OA (osteoarthritis), OSA (obstructive sleep apnea), Ventral hernia, and Wears dentures. He also  has a past surgical history that includes Knee surgery (Bilateral); Neck surgery; Carpal tunnel release; Shoulder surgery (Right); Cataract extraction, extracapsular (Left, 08/04/2015); and Cataract extraction w/PHACO (Right, 04/19/2016). Mr. Recine has a current medication list which includes the following prescription(s): acetaminophen, albuterol, atorvastatin, cetirizine, cholecalciferol, diclofenac sodium, duloxetine, glucagon, insulin aspart, insulin detemir, insulin glargine, lisinopril, magnesium, meloxicam, metoprolol tartrate, mometasone, multivitamin, naloxone, naproxen, omeprazole, oxygen-helium, pregabalin, senna, sitagliptin-metformin, tamsulosin, terbinafine, testosterone cypionate, tramadol, and trazodone. He  reports that he has quit smoking. His smoking use included cigarettes. He has a 3.75 pack-year smoking history. His smokeless tobacco use includes chew. He reports that he does not drink alcohol or use drugs. [name] has No Known Allergies.   HPI  I last saw him on 12/05/2018. He is being evaluated for a post-procedure assessment.  Post-Procedure Evaluation  Procedure: Diagnostic/therapeutic left-sided L2-3 lumbar epidural steroid injection under fluoroscopic guidance, no sedation. Pre-procedure pain level:  10/10 Post-procedure: 7/10 (< 50% relief)  Sedation: None.  Effectiveness during initial hour after procedure(Ultra-Short Term Relief): 90 %  Local anesthetic used: Long-acting (4-6 hours) Effectiveness: Defined as any analgesic benefit obtained secondary to the administration of local anesthetics. This carries significant diagnostic value as to the etiological location, or anatomical origin, of the pain. Duration of benefit is expected to coincide with the duration of the local anesthetic used.  Effectiveness during initial 4-6 hours after procedure(Short-Term Relief): 90  %  Long-term benefit: Defined as any relief past the pharmacologic duration of the local anesthetics.  Effectiveness past the initial 6 hours after procedure(Long-Term Relief): 50 %  Current benefits: Defined as benefit that persist at this time.   Analgesia:  50% improved Function: Somewhat improved ROM: Somewhat improved  Note: The patient indicates that  he has obtained significant relief of the pain and he is now able to sit without experiencing pain, which is something that was not happening before.  He will like for [place] to repeat the injection, as soon as it is possible.  This time he wants to do it with sedation.  Review of recent tests  DG C-Arm 1-60 Min-No Report Fluoroscopy was utilized by the requesting physician.  No radiographic  interpretation.    Admission on 10/09/2018, Discharged on 10/09/2018  Component Date Value Ref Range Status  . WBC 10/09/2018 7.7  4.0 - 10.5 K/uL Final  . RBC 10/09/2018 5.41  4.22 - 5.81 MIL/uL Final  . Hemoglobin 10/09/2018 13.4  13.0 - 17.0 g/dL Final  . HCT 40/98/1191 43.3  39.0 - 52.0 % Final  . MCV 10/09/2018 80.0  80.0 - 100.0 fL Final  . MCH 10/09/2018 24.8* 26.0 - 34.0 pg Final  . MCHC 10/09/2018 30.9  30.0 - 36.0 g/dL Final  . RDW 47/82/9562 15.9* 11.5 - 15.5 % Final  . Platelets 10/09/2018 300  150 - 400 K/uL Final  . nRBC 10/09/2018 0.0  0.0 - 0.2 % Final  . Neutrophils Relative % 10/09/2018 61  % Final  . Neutro Abs 10/09/2018 4.7  1.7 - 7.7 K/uL Final  . Lymphocytes Relative 10/09/2018 27  % Final  . Lymphs Abs 10/09/2018 2.1  0.7 - 4.0 K/uL Final  . Monocytes Relative 10/09/2018 8  % Final  . Monocytes Absolute 10/09/2018 0.6  0.1 - 1.0 K/uL Final  . Eosinophils Relative 10/09/2018 3  % Final  . Eosinophils Absolute 10/09/2018 0.2  0.0 - 0.5 K/uL Final  . Basophils Relative 10/09/2018 1  % Final  . Basophils Absolute 10/09/2018 0.1  0.0 - 0.1 K/uL Final  . Immature Granulocytes 10/09/2018 0  % Final  . Abs Immature  Granulocytes 10/09/2018 0.03  0.00 - 0.07 K/uL Final   Performed at [facility], [address], [place], [place] [address]  . Sodium 10/09/2018 135  135 - 145 mmol/L Final  . Potassium 10/09/2018 4.8  3.5 - 5.1 mmol/L Final  . Chloride 10/09/2018 98  98 - 111 mmol/L Final  . CO2 10/09/2018 29  22 - 32 mmol/L Final  . Glucose, Bld 10/09/2018 208* 70 - 99 mg/dL Final  . BUN 57/84/6962 15  8 - 23 mg/dL Final  . Creatinine, Ser 10/09/2018 0.79  0.61 - 1.24 mg/dL Final  . Calcium 95/28/4132 9.0  8.9 - 10.3 mg/dL Final  . Total Protein 10/09/2018 6.9  6.5 - 8.1 g/dL Final  . Albumin 44/09/270 3.9  3.5 - 5.0 g/dL Final  . AST 53/66/4403 29  15 - 41 U/L Final  . ALT 10/09/2018 24  0 - 44 U/L Final  . Alkaline Phosphatase 10/09/2018 84  38 - 126 U/L Final  . Total Bilirubin 10/09/2018 0.8  0.3 - 1.2 mg/dL Final  . GFR calc non Af Amer 10/09/2018 >60  >60 mL/min Final  . GFR calc Af Amer 10/09/2018 >60  >60 mL/min Final  . Anion gap 10/09/2018 8  5 - 15 Final   Performed at [facility], [address], [place], [place] [address]  . Color, Urine 10/09/2018 YELLOW* YELLOW Final  . APPearance 10/09/2018 CLEAR* CLEAR Final  . Specific Gravity, Urine 10/09/2018 1.017  1.005 - 1.030 Final  . pH 10/09/2018 6.0  5.0 - 8.0 Final  . Glucose, UA 10/09/2018 >=500* NEGATIVE mg/dL Final  . Hgb urine  dipstick 10/09/2018 NEGATIVE  NEGATIVE Final  . Bilirubin Urine 10/09/2018 NEGATIVE  NEGATIVE Final  . Ketones, ur 10/09/2018 5* NEGATIVE mg/dL Final  . Protein, ur 12/02/9456 NEGATIVE  NEGATIVE mg/dL Final  . Nitrite 59/29/2446 NEGATIVE  NEGATIVE Final  . Leukocytes, UA 10/09/2018 NEGATIVE  NEGATIVE Final  . WBC, UA 10/09/2018 NONE SEEN  0 - 5 WBC/hpf Final  . Bacteria, UA 10/09/2018 NONE SEEN  NONE SEEN Final  . Squamous Epithelial / LPF 10/09/2018 NONE SEEN  0 - 5 Final  . Mucus 10/09/2018 PRESENT   Final   Performed at [facility], [address] [place], [place]  [address]  . Troponin I 10/09/2018 <0.03  <0.03 ng/mL Final   Performed at [facility], [address] [place], [place] [address]   Assessment  The primary encounter diagnosis was Chronic low back pain (Primary Source of Pain) (Bilateral) (L>R). Diagnoses of Chronic lower extremity pain (Secondary source of pain) (Bilateral) (L>R) and L2-3 Disc Extrusion w/ radiculopathy were also pertinent to this visit.  Plan of Care  I discussed the assessment and treatment plan with the patient. The patient was provided an opportunity to ask questions and all were answered. The patient agreed with the plan and demonstrated an understanding of the instructions.  Patient advised to call back or seek an in-person evaluation if the symptoms or condition worsens.  I am having Lucita Lora. Bowlds maintain his multivitamin, omeprazole, metoprolol tartrate, atorvastatin, albuterol, naproxen, insulin detemir, DULoxetine, cetirizine, testosterone cypionate, insulin aspart, Magnesium, OXYGEN, mometasone, diclofenac sodium, glucagon, acetaminophen, traZODone, sitaGLIPtin-metformin, lisinopril, naloxone, meloxicam, traMADol, insulin glargine, terbinafine, senna, pregabalin, tamsulosin, and CHOLECALCIFEROL PO. Pharmacotherapy (Medications Ordered): No orders of the defined types were placed in this encounter.  Orders:  No orders of the defined types were placed in this encounter.  Follow-up plan:   Return for PRN Procedure(s): (w/ sedation) (L) L2-3 LESI #2.    Total duration of non-face-to-face encounter: 12 minutes.  Note by: Oswaldo Done, MD Date: 12/18/2018; Time: 9:55 AM  Disclaimer:  * Given the special circumstances of the COVID-19 pandemic, the federal government has announced that the Office for Civil Rights (OCR) will exercise its enforcement discretion and will not impose penalties on physicians using telehealth in the event of noncompliance with regulatory requirements under the [employer] Portability and Accountability Act (HIPAA) in connection with the good faith provision of telehealth during the COVID-19 national public health emergency. (AMA)

## 2018-12-25 ENCOUNTER — Ambulatory Visit: Payer: Medicare (Managed Care) | Admitting: Pain Medicine

## 2020-06-06 ENCOUNTER — Other Ambulatory Visit (HOSPITAL_COMMUNITY): Payer: Self-pay | Admitting: Nurse Practitioner

## 2020-06-06 ENCOUNTER — Ambulatory Visit (HOSPITAL_COMMUNITY)
Admission: RE | Admit: 2020-06-06 | Discharge: 2020-06-06 | Disposition: A | Discharge status: Home / Self Care | Payer: Medicare Other | Source: Ambulatory Visit | Attending: Pulmonary Disease | Admitting: Pulmonary Disease

## 2020-06-06 DIAGNOSIS — U071 COVID-19: Secondary | ICD-10-CM | POA: Diagnosis present

## 2020-06-06 DIAGNOSIS — Z23 Encounter for immunization: Secondary | ICD-10-CM | POA: Insufficient documentation

## 2020-06-06 DIAGNOSIS — Z6841 Body Mass Index (BMI) 40.0 and over, adult: Secondary | ICD-10-CM | POA: Diagnosis present

## 2020-06-06 DIAGNOSIS — I10 Essential (primary) hypertension: Secondary | ICD-10-CM | POA: Diagnosis present

## 2020-06-06 MED ORDER — METHYLPREDNISOLONE SODIUM SUCC 125 MG IJ SOLR: 125.0000 mg | Freq: Once | INTRAMUSCULAR | Status: DC | PRN

## 2020-06-06 MED ORDER — FAMOTIDINE IN NACL 20-0.9 MG/50ML-% IV SOLN: 20.0000 mg | Freq: Once | INTRAVENOUS | Status: DC | PRN

## 2020-06-06 MED ORDER — ALBUTEROL SULFATE HFA 108 (90 BASE) MCG/ACT IN AERS: 2.0000 | INHALATION_SPRAY | Freq: Once | RESPIRATORY_TRACT | Status: DC | PRN

## 2020-06-06 MED ORDER — SODIUM CHLORIDE 0.9 % IV SOLN
1200.0000 mg | Freq: Once | INTRAVENOUS | Status: AC
  Administered 2020-06-06: 1200 mg via INTRAVENOUS

## 2020-06-06 MED ORDER — EPINEPHRINE 0.3 MG/0.3ML IJ SOAJ: 0.3000 mg | Freq: Once | INTRAMUSCULAR | Status: DC | PRN

## 2020-06-06 MED ORDER — DIPHENHYDRAMINE HCL 50 MG/ML IJ SOLN: 50.0000 mg | Freq: Once | INTRAMUSCULAR | Status: DC | PRN

## 2020-06-06 MED ORDER — SODIUM CHLORIDE 0.9 % IV SOLN: INTRAVENOUS | Status: DC | PRN

## 2020-06-06 NOTE — Discharge Instructions (Signed)

## 2020-06-06 NOTE — Progress Notes (Addendum)
  Diagnosis: COVID-19  Physician: Dr Wright  Procedure: [facility] Med: casirivimab\imdevimab infusion - Provided patient with casirivimab\imdevimab fact sheet for patients, parents and caregivers prior to infusion.  Complications: No immediate complications noted.  Discharge: Discharged home   James Friedman 06/06/2020   

## 2020-06-06 NOTE — Progress Notes (Signed)
I connected by phone with James Friedman on 06/06/2020 at 12:29 PM to discuss the potential use of a new treatment for mild to moderate COVID-19 viral infection in non-hospitalized patients.  This patient is a 74 y.o. male that meets the FDA criteria for Emergency Use Authorization of COVID monoclonal antibody casirivimab/imdevimab.  Has a (+) direct SARS-CoV-2 viral test result  Has mild or moderate COVID-19   Is NOT hospitalized due to COVID-19  Is within 10 days of symptom onset  Has at least one of the high risk factor(s) for progression to severe COVID-19 and/or hospitalization as defined in EUA.  Specific high risk criteria : Older age (>/= 72 yo), BMI > 25 and Cardiovascular disease or hypertension   I have spoken and communicated the following to the patient or parent/caregiver regarding COVID monoclonal antibody treatment:  1. FDA has authorized the emergency use for the treatment of mild to moderate COVID-19 in adults and pediatric patients with positive results of direct SARS-CoV-2 viral testing who are 31 years of age and older weighing at least 40 kg, and who are at high risk for progressing to severe COVID-19 and/or hospitalization.  2. The significant known and potential risks and benefits of COVID monoclonal antibody, and the extent to which such potential risks and benefits are unknown.  3. Information on available alternative treatments and the risks and benefits of those alternatives, including clinical trials.  4. Patients treated with COVID monoclonal antibody should continue to self-isolate and use infection control measures (e.g., wear mask, isolate, social distance, avoid sharing personal items, clean and disinfect "high touch" surfaces, and frequent handwashing) according to CDC guidelines.   5. The patient or parent/caregiver has the option to accept or refuse COVID monoclonal antibody treatment.  After reviewing this information with the patient, The patient  agreed to proceed with receiving casirivimab\imdevimab infusion and will be provided a copy of the Fact sheet prior to receiving the infusion. James Friedman 06/06/2020 12:29 PM

## 2020-06-15 ENCOUNTER — Other Ambulatory Visit: Payer: Self-pay

## 2020-06-15 ENCOUNTER — Emergency Department
Admission: EM | Admit: 2020-06-15 | Discharge: 2020-06-20 | Disposition: E | Payer: Medicare (Managed Care) | Attending: Emergency Medicine | Admitting: Emergency Medicine

## 2020-06-15 ENCOUNTER — Emergency Department: Payer: Medicare (Managed Care)

## 2020-06-15 DIAGNOSIS — E1140 Type 2 diabetes mellitus with diabetic neuropathy, unspecified: Secondary | ICD-10-CM | POA: Insufficient documentation

## 2020-06-15 DIAGNOSIS — I10 Essential (primary) hypertension: Secondary | ICD-10-CM | POA: Insufficient documentation

## 2020-06-15 DIAGNOSIS — Z794 Long term (current) use of insulin: Secondary | ICD-10-CM | POA: Insufficient documentation

## 2020-06-15 DIAGNOSIS — Z87891 Personal history of nicotine dependence: Secondary | ICD-10-CM | POA: Diagnosis not present

## 2020-06-15 DIAGNOSIS — Z7982 Long term (current) use of aspirin: Secondary | ICD-10-CM | POA: Insufficient documentation

## 2020-06-15 DIAGNOSIS — Z7952 Long term (current) use of systemic steroids: Secondary | ICD-10-CM | POA: Diagnosis not present

## 2020-06-15 DIAGNOSIS — J189 Pneumonia, unspecified organism: Secondary | ICD-10-CM

## 2020-06-15 DIAGNOSIS — J441 Chronic obstructive pulmonary disease with (acute) exacerbation: Secondary | ICD-10-CM | POA: Insufficient documentation

## 2020-06-15 DIAGNOSIS — J9601 Acute respiratory failure with hypoxia: Secondary | ICD-10-CM

## 2020-06-15 DIAGNOSIS — Z79899 Other long term (current) drug therapy: Secondary | ICD-10-CM | POA: Diagnosis not present

## 2020-06-15 DIAGNOSIS — J45909 Unspecified asthma, uncomplicated: Secondary | ICD-10-CM | POA: Diagnosis not present

## 2020-06-15 DIAGNOSIS — R6520 Severe sepsis without septic shock: Secondary | ICD-10-CM | POA: Diagnosis not present

## 2020-06-15 DIAGNOSIS — A419 Sepsis, unspecified organism: Secondary | ICD-10-CM | POA: Diagnosis not present

## 2020-06-15 DIAGNOSIS — U071 COVID-19: Secondary | ICD-10-CM | POA: Diagnosis not present

## 2020-06-15 DIAGNOSIS — R0902 Hypoxemia: Secondary | ICD-10-CM | POA: Insufficient documentation

## 2020-06-15 DIAGNOSIS — R0602 Shortness of breath: Secondary | ICD-10-CM | POA: Diagnosis present

## 2020-06-15 DIAGNOSIS — J1282 Pneumonia due to coronavirus disease 2019: Secondary | ICD-10-CM | POA: Insufficient documentation

## 2020-06-15 LAB — BRAIN NATRIURETIC PEPTIDE: B Natriuretic Peptide: 401.6 pg/mL — ABNORMAL HIGH (ref 0.0–100.0)

## 2020-06-15 LAB — COMPREHENSIVE METABOLIC PANEL
ALT: 35 U/L (ref 0–44)
AST: 39 U/L (ref 15–41)
Albumin: 3.1 g/dL — ABNORMAL LOW (ref 3.5–5.0)
Alkaline Phosphatase: 84 U/L (ref 38–126)
Anion gap: 15 (ref 5–15)
BUN: 17 mg/dL (ref 8–23)
CO2: 28 mmol/L (ref 22–32)
Calcium: 8.6 mg/dL — ABNORMAL LOW (ref 8.9–10.3)
Chloride: 96 mmol/L — ABNORMAL LOW (ref 98–111)
Creatinine, Ser: 0.91 mg/dL (ref 0.61–1.24)
GFR calc Af Amer: >60 mL/min (ref >60)
GFR calc non Af Amer: >60 mL/min (ref >60)
Glucose, Bld: 243 mg/dL — ABNORMAL HIGH (ref 70–99)
Potassium: 3.6 mmol/L (ref 3.5–5.1)
Sodium: 139 mmol/L (ref 135–145)
Total Bilirubin: 0.7 mg/dL (ref 0.3–1.2)
Total Protein: 7.5 g/dL (ref 6.5–8.1)

## 2020-06-15 LAB — CBC WITH DIFFERENTIAL/PLATELET
Abs Immature Granulocytes: 0.12 10*3/uL — ABNORMAL HIGH (ref 0.00–0.07)
Basophils Absolute: 0 10*3/uL (ref 0.0–0.1)
Basophils Relative: 0 %
Eosinophils Absolute: 0 10*3/uL (ref 0.0–0.5)
Eosinophils Relative: 0 %
HCT: 37.8 % — ABNORMAL LOW (ref 39.0–52.0)
Hemoglobin: 11.9 g/dL — ABNORMAL LOW (ref 13.0–17.0)
Immature Granulocytes: 1 %
Lymphocytes Relative: 10 %
Lymphs Abs: 1.6 10*3/uL (ref 0.7–4.0)
MCH: 26 pg (ref 26.0–34.0)
MCHC: 31.5 g/dL (ref 30.0–36.0)
MCV: 82.7 fL (ref 80.0–100.0)
Monocytes Absolute: 1.1 10*3/uL — ABNORMAL HIGH (ref 0.1–1.0)
Monocytes Relative: 7 %
Neutro Abs: 13.2 10*3/uL — ABNORMAL HIGH (ref 1.7–7.7)
Neutrophils Relative %: 82 %
Platelets: 222 10*3/uL (ref 150–400)
RBC: 4.57 MIL/uL (ref 4.22–5.81)
RDW: 14.4 % (ref 11.5–15.5)
WBC: 16 10*3/uL — ABNORMAL HIGH (ref 4.0–10.5)
nRBC: 0 % (ref 0.0–0.2)

## 2020-06-15 LAB — PROTIME-INR
INR: 1 (ref 0.8–1.2)
Prothrombin Time: 12.8 seconds (ref 11.4–15.2)

## 2020-06-15 LAB — LACTIC ACID, PLASMA
Lactic Acid, Venous: 4.1 mmol/L (ref 0.5–1.9)
Lactic Acid, Venous: 3 mmol/L (ref 0.5–1.9)

## 2020-06-15 LAB — TROPONIN I (HIGH SENSITIVITY): Troponin I (High Sensitivity): 58 ng/L — ABNORMAL HIGH (ref <18)

## 2020-06-15 LAB — APTT: aPTT: 29 seconds (ref 24–36)

## 2020-06-15 LAB — PROCALCITONIN: Procalcitonin: 0.17 ng/mL

## 2020-06-15 MED ORDER — SODIUM BICARBONATE 8.4 % IV SOLN
INTRAVENOUS | Status: AC | PRN
  Administered 2020-06-15 (×2): 50 meq via INTRAVENOUS

## 2020-06-15 MED ORDER — MORPHINE SULFATE (PF) 2 MG/ML IV SOLN
2.0000 mg | Freq: Once | INTRAVENOUS | Status: AC
  Administered 2020-06-15: 2 mg via INTRAVENOUS
  Filled 2020-06-15: qty 1

## 2020-06-15 MED ORDER — SODIUM CHLORIDE 0.9 % IV SOLN
2.0000 g | INTRAVENOUS | Status: DC
  Administered 2020-06-15: 2 g via INTRAVENOUS
  Filled 2020-06-15: qty 20

## 2020-06-15 MED ORDER — EPINEPHRINE 1 MG/10ML IJ SOSY
PREFILLED_SYRINGE | INTRAMUSCULAR | Status: AC | PRN
  Administered 2020-06-15 (×2): 1 via INTRAVENOUS

## 2020-06-15 MED ORDER — EPINEPHRINE 1 MG/10ML IJ SOSY
PREFILLED_SYRINGE | INTRAMUSCULAR | Status: AC
  Filled 2020-06-15: qty 10

## 2020-06-15 MED ORDER — EPINEPHRINE 1 MG/10ML IJ SOSY
PREFILLED_SYRINGE | INTRAMUSCULAR | Status: AC | PRN
  Administered 2020-06-15 (×4): 1 via INTRAVENOUS

## 2020-06-15 MED ORDER — FENTANYL 2500MCG IN NS 250ML (10MCG/ML) PREMIX INFUSION
INTRAVENOUS | Status: AC
  Filled 2020-06-15: qty 250

## 2020-06-15 MED ORDER — SODIUM CHLORIDE 0.9 % IV SOLN
500.0000 mg | INTRAVENOUS | Status: DC
  Administered 2020-06-15: 500 mg via INTRAVENOUS
  Filled 2020-06-15: qty 500

## 2020-06-15 MED ORDER — LACTATED RINGERS IV SOLN: INTRAVENOUS | Status: DC

## 2020-06-15 MED ORDER — LACTATED RINGERS IV BOLUS
1000.0000 mL | Freq: Once | INTRAVENOUS | Status: AC
  Administered 2020-06-15: 1000 mL via INTRAVENOUS

## 2020-06-15 MED ORDER — PROPOFOL 1000 MG/100ML IV EMUL
INTRAVENOUS | Status: AC
  Filled 2020-06-15: qty 100

## 2020-06-20 DIAGNOSIS — 419620001 Death: Secondary | SNOMED CT | POA: Diagnosis not present

## 2020-06-20 LAB — CULTURE, BLOOD (SINGLE)
Culture: NO GROWTH
Culture: NO GROWTH
Special Requests: ADEQUATE
Special Requests: ADEQUATE

## 2020-06-20 NOTE — ED Notes (Signed)
Pt oxygen sat decreased down to 61% at this time. RN notified MD and RT. RT notified to place heated high flow on pt at this time.

## 2020-06-20 NOTE — Procedures (Signed)
Endotracheal Intubation: Patient required placement of an artificial airway secondary to acute respiratory failure due to COVID-19.  Consent: Emergent.   Hand washing performed prior to starting the procedure.   Medications administered for sedation prior to procedure:  Midazolam 2 mg IV, rocuronium 60 mg IV, etomidate 30 mg IV, fentanyl 50 mcg IV.   A time out procedure was called and correct patient, name, & ID confirmed. Needed supplies and equipment were assembled and checked to include ETT, 10 ml syringe, Glidescope, Mac and Miller blades, suction, oxygen and bag mask valve, end tidal CO2 monitor.   Patient was positioned to align the mouth and pharynx to facilitate visualization of the glottis.   Heart rate, SpO2 and blood pressure was continuously monitored during the procedure. Pre-oxygenation was conducted prior to intubation with ball-valve mask and PEEP valve.  Saturations never reached above 80%.  A glide scope with #4 blade was utilized to visualize the airway.  There was excellent visualization of the airway.  #8 ET tube was slid through the vocal cords without difficulty on first pass.  Confirmation with EZ cap, auscultation and direct visualization.  ETT was secured at 24 cm mark at the gums.   Placement was confirmed by auscuitation of lungs with good breath sounds bilaterally and no epigastric sounds.  Condensation was noted on endotracheal tube.  CO2 detector in place with appropriate color change.   Complications: Bradycardia noted immediately post intubation.  This was followed by ST elevation.  Patient then had pulse rate returned to 79 to 80 bpm.  Patient was placed on the vent.  Operator: Patsey Berthold  Chest radiograph ordered and pending.     Renold Don, MD [facility]

## 2020-06-20 NOTE — ED Notes (Addendum)
Fentanyl spiked per MD request but never initiated. Entire content of fentanyl drip wasted with kate bumgarner

## 2020-06-20 NOTE — ED Notes (Signed)
Amp epi given now

## 2020-06-20 NOTE — ED Provider Notes (Signed)
[facility] Emergency Department Provider Note   ____________________________________________   First MD Initiated Contact with Patient Jul 07, 2020 [phone]     (approximate)  I have reviewed the triage vital signs and the nursing notes.   HISTORY  Chief Complaint Shortness of Breath    HPI James Friedman is a 74 y.o. male history of hypertension, sleep apnea, diabetes  Patient called EMS for shortness of breath.  Noted to have increased shortness of breath of the last few days.  Patient reports he started having symptoms of Covid on the ninth of this month, he then tested positive several days later, and did receive treatment in [place] with the antibodies  He is not improved, and he seems to be worsening.  Continues to have ongoing shortness of breath, uses oxygen at night, but today having to use during the day and increasing shortness of breath  EMS reports that he was treated with 1 nebulizer treatment, placed on nonrebreather, given 125 mg Solu-Medrol had improvement in oxygen saturation from the mid to low 80% range while on 6 L up to oxygen saturations in the low 90s on nonrebreather after treatments  Patient reports he continues to feel short of breath.  No chest pain.  No swelling.  No history of blood clots.  No ongoing fevers, continues to feel fatigued.   Past Medical History:  Diagnosis Date   Anxiety    Asthma    Atypical chest pain    a. 2014 Neg MV.   Back pain    BPH (benign prostatic hypertrophy)    Carpal tunnel syndrome    Carpal tunnel syndrome on right 07/17/2015   Chest pain 07/26/2013   DDD (degenerative disc disease)    Diabetes mellitus without complication (HCC)    Essential hypertension    Falls    GERD (gastroesophageal reflux disease)    Heme positive stool    History of bilateral knee surgery 07/17/2015   Hyperlipidemia    Hypertension    Morbid obesity (HCC)    OA (osteoarthritis)    knees    OSA (obstructive sleep apnea)    CPAP   Ventral hernia    Wears dentures    full upper and lower    Patient Active Problem List   Diagnosis Date Noted   Morbid obesity with BMI of 40.0-44.9, adult (HCC) 11/20/2018   L2-3 Disc Extrusion w/ radiculopathy 11/20/2018   DDD (degenerative disc disease), lumbar 11/16/2017   DDD (degenerative disc disease), cervical 11/16/2017   Cervicalgia 11/16/2017   Abnormal MRI, lumbar spine (07/21/17) 10/17/2017   Polypharmacy 06/27/2017   Chronic lumbar radiculopathy 06/07/2017   Lumbar spinal stenosis (L3-4) 06/07/2017   Suprapatellar bursitis of right knee 04/26/2017   Primary osteoarthritis of left knee 03/07/2017   Primary osteoarthritis of right knee 03/07/2017   Cervical myofascial pain syndrome (Left trapezius) 02/16/2017   Osteoarthritis of knee (Bilateral) 02/02/2017   Osteoarthritis of shoulder (Bilateral) 02/02/2017   Chronic shoulder Arthropathy (Bilateral) 02/02/2017   Lumbar spondylosis 07/08/2016   COPD 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 (HCC) 07/08/2016   Polyneuropathy 01/29/2016   Postoperative pain (post-radiofrequency) 12/09/2015   Abnormal EMG/PNCV (11/26/2015) 12/04/2015   Chronic knee pain (Bilateral) 11/12/2015   Diabetic peripheral neuropathy (HCC) 11/12/2015   Chronic lumbar radicular pain (Bilateral) (L>R) (S1 Dermatome on the left) 11/12/2015   Essential hypertension    Diabetes mellitus without complication (HCC)    Atypical chest pain    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) 08/26/2015  Grade 1 Anterolisthesis of L4 over L5 08/26/2015   Lumbar disc herniation (Left L4-5) (Right L5-S1) 08/26/2015   Chronic lower extremity pain (Secondary source of pain) (Bilateral) (L>R) 08/26/2015   Transient weakness of left lower extremity 07/31/2015   At high risk for falls 07/31/2015   Chronic pain syndrome 07/17/2015   Chronic low back pain (Primary Source of Pain) (Bilateral)  (L>R) 07/17/2015   Lumbosacral radiculopathy at S1 (Right side) 07/17/2015   Lumbar facet syndrome (Bilateral) (L>R) 07/17/2015   Chronic neck pain (Bilateral) (R>L) 07/17/2015   Chronic cervical radicular pain (Bilateral) (R>L) 07/17/2015   Cervical spondylosis 07/17/2015   Morbid obesity (HCC) 07/17/2015   Long term current use of opiate analgesic 07/17/2015   Long term prescription opiate use 07/17/2015   Opiate use (60 MME/Day) 07/17/2015   Opiate dependence (HCC) 07/17/2015   Chronic sacroiliac joint pain (Bilateral) (R>L) 07/17/2015   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   Chronic upper extremity pain (Bilateral) (R>L) 07/17/2015   History of bilateral knee surgery 07/17/2015   Diffuse myofascial pain syndrome 07/17/2015   Lumbar facet hypertrophy (L4-5 and L5-S1) 07/17/2015   Carpal tunnel syndrome on right (Released) 07/17/2015    Class: History of   Hyperlipidemia    Chronic shoulder pain (Bilateral) (L>R) 03/12/2013   Non-traumatic rotator cuff tear 02/26/2013    Past Surgical History:  Procedure Laterality Date   CARPAL TUNNEL RELEASE     CATARACT EXTRACTION EXTRACAPSULAR Left 08/04/2015   Procedure: CATARACT EXTRACTION EXTRACAPSULAR WITH INTRAOCULAR LENS PLACEMENT (IOC);  Surgeon: Sherald Hess, MD;  Location: [facility];  Service: Ophthalmology;  Laterality: Left;  DIABETIC - insulin and oral meds   CATARACT EXTRACTION W/PHACO Right 04/19/2016   Procedure: CATARACT EXTRACTION PHACO AND INTRAOCULAR LENS PLACEMENT (IOC);  Surgeon: Sherald Hess, MD;  Location: [facility];  Service: Ophthalmology;  Laterality: Right;  RIGHT DIABETIC - insulin and oral meds Sleep apnea   KNEE SURGERY Bilateral    NECK SURGERY     SHOULDER SURGERY Right     Prior to Admission medications   Medication Sig Start Date End Date Taking? Authorizing Provider  albuterol (PROAIR HFA) 108 (90 BASE) MCG/ACT inhaler Inhale 2  puffs into the lungs every 6 (six) hours as needed for wheezing.   Yes [provider]  aspirin 81 MG chewable tablet Chew by mouth daily.   Yes [provider]  budesonide-formoterol (SYMBICORT) 160-4.5 MCG/ACT inhaler SMARTSIG:2 Puff(s) By Mouth Twice Daily 06/09/20  Yes [provider]  cholecalciferol (VITAMIN D) 25 MCG (1000 UNIT) tablet Take 1,000 Units by mouth daily. 01/19/20  Yes [provider]  cyclobenzaprine (FLEXERIL) 10 MG tablet Take by mouth. 12/01/07  Yes [provider]  DESITIN 13 % CREA SMARTSIG:Liberally Topical 3 Times Daily 06/09/20  Yes [provider]  docusate sodium (COLACE) 100 MG capsule Take 100 mg by mouth 2 (two) times daily. while taking narcotics. hold for loose stools   Yes [provider]  gabapentin (NEURONTIN) 300 MG capsule Take 300 mg by mouth at bedtime.   Yes [provider]  glipiZIDE (GLUCOTROL) 10 MG tablet Take 10 mg by mouth daily before breakfast.   Yes [provider]  HYDROCODONE-ACETAMINOPHEN PO Take by mouth. Frequency:BID-TIDPRN Dosage:0.0 Instructions: Note:Dose: 5MG -500MG    Yes [provider]  HYDROmorphone (DILAUDID) 2 MG tablet Take by mouth every 4 (four) hours as needed for severe pain.   Yes [provider]  hydrOXYzine (ATARAX/VISTARIL) 25 MG tablet Take 25  mg by mouth daily as needed.   Yes [provider]  indomethacin (INDOCIN) 25 MG capsule Take 75 mg by mouth 2 (two) times daily with a meal.   Yes [provider]  ipratropium-albuterol (DUONEB) 0.5-2.5 (3) MG/3ML SOLN SMARTSIG:1 Vial(s) Via Nebulizer Every 4-6 Hours PRN 06/13/20  Yes [provider]  lisinopril-hydrochlorothiazide (ZESTORETIC) 20-12.5 MG tablet Take 1 tablet by mouth daily.   Yes [provider]  metFORMIN (GLUCOPHAGE) 1000 MG tablet Take 1,000 mg by mouth 2 (two) times daily with a meal.   Yes [provider]  MUCINEX 600 MG 12 hr  tablet Take 600 mg by mouth 2 (two) times daily as needed. 06/02/20  Yes [provider]  oxyCODONE (OXY IR/ROXICODONE) 5 MG immediate release tablet Take 5 mg by mouth every 4 (four) hours as needed for severe pain.   Yes [provider]  oxyCODONE-acetaminophen (PERCOCET/ROXICET) 5-325 MG tablet Take 1 tablet by mouth every 4 (four) hours as needed for severe pain.   Yes [provider]  polyethylene glycol (MIRALAX / GLYCOLAX) 17 g packet Take 17 g by mouth daily. WHILE ON NARCOTICS; HOLD FOR LOOSE STOOLS   Yes [provider]  VOLTAREN 1 % GEL Apply 2 g topically 4 (four) times daily. 05/28/20  Yes [provider]  acetaminophen (TYLENOL) 325 MG tablet Take 650 mg by mouth as needed.  04/24/10   [provider]  atorvastatin (LIPITOR) 80 MG tablet Take 80 mg by mouth daily.    [provider]  cetirizine (ZYRTEC) 10 MG tablet Take 10 mg by mouth daily.    [provider]  DULoxetine (CYMBALTA) 30 MG capsule Take 30 mg by mouth 2 (two) times daily. One in a.m., two at night    [provider]  glucagon (GLUCAGON EMERGENCY) 1 MG injection Inject 1 mg into the vein once as needed.    [provider]  insulin aspart (NOVOLOG) 100 UNIT/ML injection Inject 70 Units into the skin 3 (three) times daily with meals.     [provider]  insulin detemir (LEVEMIR) 100 UNIT/ML injection Inject 90 Units into the skin 2 (two) times daily.     [provider]  insulin glargine (LANTUS) 100 UNIT/ML injection Inject 10 Units into the skin at bedtime.    [provider]  lisinopril (PRINIVIL,ZESTRIL) 10 MG tablet Take 10 mg by mouth daily.    [provider]  Magnesium 400 MG TABS Take 1 tablet by mouth 2 (two) times daily.     [provider]  meloxicam (MOBIC) 15 MG tablet Take 15 mg by mouth daily.     [provider]  metoprolol (LOPRESSOR) 100 MG tablet Take 100 mg by mouth  daily.     [provider]  mometasone (NASONEX) 50 MCG/ACT nasal spray Place 2 sprays into the nose daily.    [provider]  Multiple Vitamin (MULTIVITAMIN) tablet Take 1 tablet by mouth daily.    [provider]  naloxone [facility]) nasal spray 4 mg/0.1 mL Place 1 spray into the nose.    [provider]  naproxen (NAPROSYN) 500 MG tablet Take 500 mg by mouth 2 (two) times daily with a meal.    [provider]  omeprazole (PRILOSEC) 20 MG capsule Take 20 mg by mouth daily.    [provider]  OPTIVE 0.5-0.9 % ophthalmic solution  02/26/20   [provider]  predniSONE (DELTASONE) 20 MG tablet Take 40  mg by mouth daily. 06/13/20   [provider]  pregabalin (LYRICA) 50 MG capsule Take 50 mg by mouth 2 (two) times daily.    [provider]  senna (SENOKOT) 8.6 MG TABS tablet Take 2 tablets by mouth at bedtime.    [provider]  sitaGLIPtin-metformin (JANUMET) 50-1000 MG tablet Take 1 tablet by mouth 2 (two) times daily with a meal.    [provider]  tamsulosin (FLOMAX) 0.4 MG CAPS capsule Take 0.4 mg by mouth daily after supper.    [provider]  terbinafine (LAMISIL) 1 % cream Apply 1 application topically 2 (two) times daily.    [provider]  testosterone cypionate (DEPOTESTOTERONE CYPIONATE) 200 MG/ML injection Inject 200 mg into the muscle every 14 (fourteen) days.    [provider]  traMADol (ULTRAM) 50 MG tablet Take by mouth every 8 (eight) hours as needed.    [provider]  traZODone (DESYREL) 150 MG tablet 150 mg daily.  01/25/13   [provider]    Allergies Patient has no known allergies.  Family History  Problem Relation Age of Onset   Heart attack Father     Social History Social History   Tobacco Use   Smoking status: Former Smoker    Packs/day: 0.25    Years: 15.00    Pack years: 3.75    Types: Cigarettes    Smokeless tobacco: Former Neurosurgeon    Types: Associate Professor Use: Never used  Substance Use Topics   Alcohol use: No    Alcohol/week: 0.0 standard drinks   Drug use: No    Review of Systems Constitutional: No fever/chills Eyes: No visual changes. ENT: No sore throat. Cardiovascular: Denies chest pain. Respiratory: See HPI Gastrointestinal: No abdominal pain.   Genitourinary: Negative for dysuria. Musculoskeletal: Negative for back pain. Skin: Negative for rash. Neurological: Negative for headaches.    ____________________________________________   PHYSICAL EXAM:  VITAL SIGNS: ED Triage Vitals [21-Jun-2020 0834]  Enc Vitals Group     BP 138/87     Pulse Rate (!) 123     Resp (!) 21     Temp      Temp src      SpO2 90 %     Weight 252 lb (114.3 kg)     Height 5\' 9"  (1.753 m)     Head Circumference      Peak Flow      Pain Score 0     Pain Loc      Pain Edu?      Excl. in GC?     Constitutional: Alert and oriented.  Mildly ill-appearing, relaxed, currently on nonrebreather. Eyes: Conjunctivae are normal. Head: Atraumatic. Nose: No congestion/rhinnorhea. Mouth/Throat: Mucous membranes are moist. Neck: No stridor.  Cardiovascular: Normal rate, regular rhythm. Grossly normal heart sounds.  Good peripheral circulation. Respiratory: Very mild tachypnea.  Very mild accessory muscle use.  Lung sounds clear bilaterally.  No wheezing rales or rhonchi.  No distress. Gastrointestinal: Soft and nontender. No distention. Musculoskeletal: No lower extremity tenderness nor edema. Neurologic:  Normal speech and language. No gross focal neurologic deficits are appreciated.  Skin:  Skin is warm, dry and intact. No rash noted. Psychiatric: Mood and affect are normal. Speech and behavior are normal.  ____________________________________________   LABS (all labs ordered are listed, but only abnormal results are displayed)  Labs Reviewed  LACTIC ACID, PLASMA -  Abnormal; Notable for the following components:  Result Value   Lactic Acid, Venous 4.1 (*)    All other components within normal limits  LACTIC ACID, PLASMA - Abnormal; Notable for the following components:   Lactic Acid, Venous 3.0 (*)    All other components within normal limits  COMPREHENSIVE METABOLIC PANEL - Abnormal; Notable for the following components:   Chloride 96 (*)    Glucose, Bld 243 (*)    Calcium 8.6 (*)    Albumin 3.1 (*)    All other components within normal limits  CBC WITH DIFFERENTIAL/PLATELET - Abnormal; Notable for the following components:   WBC 16.0 (*)    Hemoglobin 11.9 (*)    HCT 37.8 (*)    Neutro Abs 13.2 (*)    Monocytes Absolute 1.1 (*)    Abs Immature Granulocytes 0.12 (*)    All other components within normal limits  BRAIN NATRIURETIC PEPTIDE - Abnormal; Notable for the following components:   B Natriuretic Peptide 401.6 (*)    All other components within normal limits  TROPONIN I (HIGH SENSITIVITY) - Abnormal; Notable for the following components:   Troponin I (High Sensitivity) 58 (*)    All other components within normal limits  CULTURE, BLOOD (SINGLE)  CULTURE, BLOOD (SINGLE)  MRSA PCR SCREENING  PROTIME-INR  APTT  PROCALCITONIN  URINALYSIS, COMPLETE (UACMP) WITH MICROSCOPIC   ____________________________________________  EKG  Reviewed inter by me at 830 Heart rate 120 QRS 100 QTc 500 Sinus tachycardia, unusual R wave progression anterior.  No evidence of acute ischemia. ____________________________________________  RADIOLOGY  DG Chest Port 1 View  Result Date: 2020-01-08 CLINICAL DATA:  COVID.  Increasing shortness of breath. EXAM: PORTABLE CHEST 1 VIEW COMPARISON:  12/05/2014 chest radiograph FINDINGS: New diffuse airspace opacities throughout the RIGHT line and within the LEFT LOWER lung noted, likely representing infection/pneumonia. There may be a RIGHT pleural effusion present. There is no evidence of pneumothorax. No  acute bony abnormalities are identified. The cardiomediastinal silhouette is obscured. IMPRESSION: New bilateral airspace opacities, RIGHT greater than LEFT, likely representing infection/pneumonia. Possible RIGHT pleural effusion. Electronically Signed   By: Harmon PierJeffrey  Hu M.D.   On: 02021-04-20 09:42    Imaging reviewed personally by me, concerning for bilateral airspace opacities. ____________________________________________   PROCEDURES  Procedure(s) performed: None  Procedures  Critical Care performed: Yes, see critical care note(s)  CRITICAL CARE Performed by: Sharyn CreamerMark Lorrene Graef   Total critical care time: 45 minutes  Critical care time was exclusive of separately billable procedures and treating other patients.  Critical care was necessary to treat or prevent imminent or life-threatening deterioration.  Critical care was time spent personally by me on the following activities: development of treatment plan with patient and/or surrogate as well as nursing, discussions with consultants, evaluation of patient's response to treatment, examination of patient, obtaining history from patient or surrogate, ordering and performing treatments and interventions, ordering and review of laboratory studies, ordering and review of radiographic studies, pulse oximetry and re-evaluation of patient's condition.  ____________________________________________   INITIAL IMPRESSION / ASSESSMENT AND PLAN / ED COURSE  Pertinent labs & imaging results that were available during my care of the patient were reviewed by me and considered in my medical decision making (see chart for details).   Patient diagnosed with COVID-19 recently, treated with antibody infusion.  Currently several days from the onset of symptoms he reports he started having symptoms the ninth of this month.  Tested positive with test result approximately 9/15 this year.  Continues to report ongoing symptoms, not  improving and somewhat  worsening.  Denies any chest pain.  Does not have secondary evidence of acute pulmonary embolism or DVT such as unilateral leg swelling, pleuritic chest pain etc.  I suspect this may be worsening of Covid like pneumonia or superinfection, but differential diagnosis is broad.  Proceed with additional work-up including chest x-ray, procalcitonin, labs etc.    Clinical Course as of Jun 15 1333  Sun Jul 11, 2020  0957 Admission discussed, accepted by Dr. Sarina Ser of [facility]   [MQ]  [phone] Concern for sepsis with associated tachycardia, elevated lactic acidosis.  Patient does however appear normotensive with normal capillary refill.  His oxygenation is markedly improved on high flow nasal cannula and his work of breathing he is tolerating very well.  I would describe him as "happily hypoxic" at this point.  Fully awake and alert.  Mild dyspnea, speaking clearly with nasal cannula high flow at this time.  Antibiotics and fluids ordered,   [MQ]  1116 Sepsis reassessment.  Discussed with the patient, and he is currently alert, normotensive remains somewhat tachycardic.  Tolerating high flow nasal cannula well.  Does have chronic back pain would like a little more morphine but reports he is more comfortable and would like to try lay down.  He appears improved, discussed risks and benefits of additional fluid boluses including additional bags of crystalloid, patient and I with shared medical decision making we'll continue to carefully manage his fluids but opted not to give 30 mL/kg bolus due to risk of developing worsening or CHF after discussion with shared medical decision making.  Patient is full code, but obviously would highly like to avoid intubation   [MQ]  1201 Reassessment, patient was laying flat for a little bit and got diaphoretic and dyspneic.  He has been repositioned, improved, no ongoing hypoxia continues to have mild increased work of breathing fully alert.  I do however have concerns that his  respiratory status could worsen and we will prepare, have had respiratory therapy come to set up in the event the patient does need to be intubated if he worsens.   [MQ]  1308 Discussed with case with patient's wife, James Friedman regarding CPR, intubation, and worsening. Patient's wife aware. Dr. Jayme Cloud and I both at bedside rapidly after patient arrested post-intubation. ETT confirmed with square waveform after CPR started and I entered the room. Also Dr. Jayme Cloud and I looked with glidescope and endotracheal tube is through vocal cords during CPR. I assisted with CPR. Patient decompensated quite rapidly in the ER despite treatments and later intubatin by critical care team.    [MQ]  1314 James Friedman, son, also came to phone. Updated him patient has died. Patient's wife also aware and at same place as son.    [MQ]  1318 While on phone with family, Dr. Jayme Cloud. PCP is Dr. Marvis Moeller at [facility].    [MQ]  1322 PCP Dr. Marvis Moeller contacted and will be available and agreeable to signing death certificate. Time of death and declaration of death made by Dr. Jayme Cloud at 1314hrs   [MQ]  [phone] Discussed with ME, Darnell Level, NOT an ME case. Body may move to  morgue  [MQ]    Clinical Course User Index [MQ] Sharyn Creamer, MD     ____________________________________________   FINAL CLINICAL IMPRESSION(S) / ED DIAGNOSES  Final diagnoses:  Pneumonia due to COVID-19 virus  Severe sepsis (HCC)  Hypoxia  Community acquired pneumonia, unspecified laterality        Note:  This  document was prepared using Conservation officer, historic buildings and may include unintentional dictation errors       Sharyn Creamer, MD 2020-06-30 1334

## 2020-06-20 NOTE — Code Documentation (Signed)
Pulse check, no pulse, compressions resumed.

## 2020-06-20 NOTE — ED Notes (Signed)
All tubes and IV removed post mortem care completed at this time.

## 2020-06-20 NOTE — Significant Event (Addendum)
Patient with severe hypoxemia due to COVID-19, profuse diaphoresis noted.  Lactic acidosis noted prior to intubation.  Patient was intubated postprocedure had transient bradycardia and ST elevation noted on monitor.  Subsequently this resolved.  As the patient was being placed on the ventilator ST elevations were noted again, an EKG was obtained and at that very moment the patient became bradycardic and lost pulse.  CPR was initiated.  Time of start of code was approximately 1248.  Dr. Drema Dallas was also in attendancee, Dr. Fanny Bien then left the room to communicate with family while CPR was in progress.  I remained supervising CPR/ACLS.  Please see documentation of code by ED nursing personnel.  The patient never had return of spontaneous circulation despite good quality CPR, and aggressive ACLS measures.  Quick look echocardiogram during code showed that the patient's cardiac function had ceased.  Code called at this point.  Patient was pronounced dead at 1314.  Gailen Shelter, MD [facility]   *This note was dictated using voice recognition software/Dragon.  Despite best efforts to proofread, errors can occur which can change the meaning.  Any change was purely unintentional.

## 2020-06-20 NOTE — ED Triage Notes (Signed)
Pt arrives via ems for sob, diagnosed covid on 9th. Normally on 2 l at night for sleep apnea. Family increased [place] x2 on 6L each total 12l [place] at once sat level still only low 80's. Pt placed on non rebreather 15L increased to 86%. Pt given one duoned, 1 albuterol and 125 solumedrol and pt sat increased to 92.   Ems vitals  HR in 117. 133/92 rr 20-26 co2 25-30. Prior to treatments in 35-40's

## 2020-06-20 NOTE — ED Notes (Signed)
Dopamine started at 15 at this time

## 2020-06-20 NOTE — ED Notes (Signed)
50 fentanyl at 1232 2 versed at 1232 30 Etomidate at 1233 50 fentanyl at 1234 60 Roc at 1234  Sedation med admin

## 2020-06-20 NOTE — ED Notes (Addendum)
Pt had episode of urinary incontinence, md notified and asked if an I&o cath would be needed, MD states no urine needed at this time. Pt more lethargic at this time. Diaphoretic. Sluggish to respond initially. After clothing changed and linen change pt appeared slightly more alert. MD notified of pt status

## 2020-06-20 NOTE — Code Documentation (Signed)
Cardiac activity checked with ultrasound at this time. CPR resumed, no activity at this time

## 2020-06-20 NOTE — ED Notes (Signed)
MD at bedside to intubate pt at his time

## 2020-06-20 NOTE — ED Notes (Signed)
MD states only faint pulse rate decrease to 30. Chest compressions initiated at this time

## 2020-06-20 NOTE — Code Documentation (Signed)
CPR stopped at this time. MD called TOD called at 1314

## 2020-06-20 NOTE — Code Documentation (Signed)
Pulse check 1258, cpr continued

## 2020-06-20 NOTE — ED Notes (Signed)
[place] [facility] contacted at this time. Release # [id number]

## 2020-06-20 NOTE — Consult Note (Addendum)
Reason for Consult: Evaluation for acute hypoxic respiratory failure, in the setting of COVID-19, potential admit to ICU. Referring Physician: Sharyn Creamer, MD  James Friedman is an 74 y.o. male.  HPI:  Patient is a 74 year old with multiple comorbidities as noted below, who presented to [facility] because of increasing shortness of breath.  He was brought via EMS.  The patient was diagnosed with COVID-19 on 17 September.  At that time he was also evaluated for monoclonal antibodies which he received.  He had started having symptoms of COVID-19 on 9 September.  Of note several of his family members have COVID-19 including his daughter who is currently in the ICU at [facility].  The patiently reportedly had had COVID-19 vaccine however I do not see documentation of this.  Patient could not tell me as he was somewhat lethargic at the time of evaluation.  Patient was on high flow O2 and nonrebreather mask.  Saturations were in the low to mid 80s.  On my evaluation the patient appears uncomfortable.  Becoming somewhat lethargic he is complaining of thirst he was exceedingly diaphoretic.  He was noted to have a significant lactic acidosis.  During evaluation the patient saturations dropped to the 60s.  Given all these findings it was determined that patient should be intubated emergently.  Dr. Fanny Bien was notified.  Past Medical History:  Diagnosis Date  . Anxiety   . Asthma   . Atypical chest pain    a. 2014 Neg MV.  . Back pain   . BPH (benign prostatic hypertrophy)   . Carpal tunnel syndrome   . Carpal tunnel syndrome on right 07/17/2015  . Chest pain 07/26/2013  . DDD (degenerative disc disease)   . Diabetes mellitus without complication (HCC)   . Essential hypertension   . Falls   . GERD (gastroesophageal reflux disease)   . Heme positive stool   . History of bilateral knee surgery 07/17/2015  . Hyperlipidemia   . Hypertension   . Morbid obesity (HCC)   . OA (osteoarthritis)    knees  . OSA  (obstructive sleep apnea)    CPAP  . Ventral hernia   . Wears dentures    full upper and lower    Past Surgical History:  Procedure Laterality Date  . CARPAL TUNNEL RELEASE    . CATARACT EXTRACTION EXTRACAPSULAR Left 08/04/2015   Procedure: CATARACT EXTRACTION EXTRACAPSULAR WITH INTRAOCULAR LENS PLACEMENT (IOC);  Surgeon: Sherald Hess, MD;  Location: [facility];  Service: Ophthalmology;  Laterality: Left;  DIABETIC - insulin and oral meds  . CATARACT EXTRACTION W/PHACO Right 04/19/2016   Procedure: CATARACT EXTRACTION PHACO AND INTRAOCULAR LENS PLACEMENT (IOC);  Surgeon: Sherald Hess, MD;  Location: [facility];  Service: Ophthalmology;  Laterality: Right;  RIGHT DIABETIC - insulin and oral meds Sleep apnea  . KNEE SURGERY Bilateral   . NECK SURGERY    . SHOULDER SURGERY Right     Family History  Problem Relation Age of Onset  . Heart attack Father     Social History:  reports that he has quit smoking. His smoking use included cigarettes. He has a 3.75 pack-year smoking history. He has quit using smokeless tobacco.  His smokeless tobacco use included chew. He reports that he does not drink alcohol and does not use drugs.  Allergies: No Known Allergies  Medications: I have reviewed the patient's current medications.  Results for orders placed or performed during the hospital encounter of 06/27/20 (from the past 48  hour(s))  Comprehensive metabolic panel     Status: Abnormal   Collection Time: 30-Jun-2020  8:34 AM  Result Value Ref Range   Sodium 139 135 - 145 mmol/L   Potassium 3.6 3.5 - 5.1 mmol/L   Chloride 96 (L) 98 - 111 mmol/L   CO2 28 22 - 32 mmol/L   Glucose, Bld 243 (H) 70 - 99 mg/dL    Comment: Glucose reference range applies only to samples taken after fasting for at least 8 hours.   BUN 17 8 - 23 mg/dL   Creatinine, Ser 3.97 0.61 - 1.24 mg/dL   Calcium 8.6 (L) 8.9 - 10.3 mg/dL   Total Protein 7.5 6.5 - 8.1 g/dL   Albumin 3.1  (L) 3.5 - 5.0 g/dL   AST 39 15 - 41 U/L   ALT 35 0 - 44 U/L   Alkaline Phosphatase 84 38 - 126 U/L   Total Bilirubin 0.7 0.3 - 1.2 mg/dL   GFR calc non Af Amer >60 >60 mL/min   GFR calc Af Amer >60 >60 mL/min   Anion gap 15 5 - 15    Comment: Performed at [facility], [address] [place], [place] [address]  CBC WITH DIFFERENTIAL     Status: Abnormal   Collection Time: 06/30/20  8:34 AM  Result Value Ref Range   WBC 16.0 (H) 4.0 - 10.5 K/uL   RBC 4.57 4.22 - 5.81 MIL/uL   Hemoglobin 11.9 (L) 13.0 - 17.0 g/dL   HCT 93.7 (L) 39 - 52 %   MCV 82.7 80.0 - 100.0 fL   MCH 26.0 26.0 - 34.0 pg   MCHC 31.5 30.0 - 36.0 g/dL   RDW 90.2 40.9 - 73.5 %   Platelets 222 150 - 400 K/uL   nRBC 0.0 0.0 - 0.2 %   Neutrophils Relative % 82 %   Neutro Abs 13.2 (H) 1.7 - 7.7 K/uL   Lymphocytes Relative 10 %   Lymphs Abs 1.6 0.7 - 4.0 K/uL   Monocytes Relative 7 %   Monocytes Absolute 1.1 (H) 0 - 1 K/uL   Eosinophils Relative 0 %   Eosinophils Absolute 0.0 0 - 0 K/uL   Basophils Relative 0 %   Basophils Absolute 0.0 0 - 0 K/uL   Immature Granulocytes 1 %   Abs Immature Granulocytes 0.12 (H) 0.00 - 0.07 K/uL    Comment: Performed at [facility], [address] [place], [place] [address]  Protime-INR     Status: None   Collection Time: 2020/06/30  8:34 AM  Result Value Ref Range   Prothrombin Time 12.8 11.4 - 15.2 seconds   INR 1.0 0.8 - 1.2    Comment: (NOTE) INR goal varies based on device and disease states. Performed at [facility], [address] [place], [place] [address]   APTT     Status: None   Collection Time: June 30, 2020  8:34 AM  Result Value Ref Range   aPTT 29 24 - 36 seconds    Comment: Performed at [facility], [address] [place], [place] [address]  Troponin I (High Sensitivity)     Status: Abnormal   Collection Time: 2020-06-30  8:34 AM  Result Value Ref Range   Troponin I (High Sensitivity) 58 (H) <18 ng/L    Comment:  (NOTE) Elevated high sensitivity troponin I (hsTnI) values and significant  changes across serial measurements may suggest ACS but many other  chronic and acute conditions are  known to elevate hsTnI results.  Refer to the "Links" section for chest pain algorithms and additional  guidance. Performed at [facility], [address] [place], [place] [id number]   Procalcitonin - Baseline     Status: None   Collection Time: 2019/12/20  8:34 AM  Result Value Ref Range   Procalcitonin 0.17 ng/mL    Comment:        Interpretation: PCT (Procalcitonin) <= 0.5 ng/mL: Systemic infection (sepsis) is not likely. Local bacterial infection is possible. (NOTE)       Sepsis PCT Algorithm           Lower Respiratory Tract                                      Infection PCT Algorithm    ----------------------------     ----------------------------         PCT < 0.25 ng/mL                PCT < 0.10 ng/mL          Strongly encourage             Strongly discourage   discontinuation of antibiotics    initiation of antibiotics    ----------------------------     -----------------------------       PCT 0.25 - 0.50 ng/mL            PCT 0.10 - 0.25 ng/mL               OR       >80% decrease in PCT            Discourage initiation of                                            antibiotics      Encourage discontinuation           of antibiotics    ----------------------------     -----------------------------         PCT >= 0.50 ng/mL              PCT 0.26 - 0.50 ng/mL               AND        <80% decrease in PCT             Encourage initiation of                                             antibiotics       Encourage continuation           of antibiotics    ----------------------------     -----------------------------        PCT >= 0.50 ng/mL                  PCT > 0.50 ng/mL               AND         increase in PCT                  Strongly encourage  initiation of antibiotics    Strongly encourage escalation           of antibiotics                                     -----------------------------                                           PCT <= 0.25 ng/mL                                                 OR                                        > 80% decrease in PCT                                      Discontinue / Do not initiate                                             antibiotics  Performed at [facility], [address] [place], [place] [address]   Lactic acid, plasma     Status: Abnormal   Collection Time: June 22, 2020  8:40 AM  Result Value Ref Range   Lactic Acid, Venous 4.1 (HH) 0.5 - 1.9 mmol/L    Comment: CRITICAL RESULT CALLED TO, READ BACK BY AND VERIFIED WITH LORRIE LEMONS AT 0935 2020-06-22 DAS Performed at [facility], [address], [place], [place] [address]   Brain natriuretic peptide     Status: Abnormal   Collection Time: 06/22/20  8:40 AM  Result Value Ref Range   B Natriuretic Peptide 401.6 (H) 0.0 - 100.0 pg/mL    Comment: Performed at [facility], [address] [place], [place] [address]  Lactic acid, plasma     Status: Abnormal   Collection Time: 06/22/20 10:44 AM  Result Value Ref Range   Lactic Acid, Venous 3.0 (HH) 0.5 - 1.9 mmol/L    Comment: CRITICAL VALUE NOTED. VALUE IS CONSISTENT WITH PREVIOUSLY REPORTED/CALLED VALUE DAS Performed at [facility], [address] [place], [place] [address]     DG Chest [place] 1 View  Result Date: June 22, 2020 CLINICAL DATA:  COVID.  Increasing shortness of breath. EXAM: PORTABLE CHEST 1 VIEW COMPARISON:  12/05/2014 chest radiograph FINDINGS: New diffuse airspace opacities throughout the RIGHT line and within the LEFT LOWER lung noted, likely representing infection/pneumonia. There may be a RIGHT pleural effusion present. There is no evidence of pneumothorax. No acute bony abnormalities are identified. The  cardiomediastinal silhouette is obscured. IMPRESSION: New bilateral airspace opacities, RIGHT greater than LEFT, likely representing infection/pneumonia. Possible RIGHT pleural effusion. Electronically Signed   By: Harmon Pier M.D.   On: 06-22-20 09:42    Review of Systems  Unable to perform ROS: Acuity of condition    Blood  pressure 114/84, pulse (!) 130, temperature 99 F (37.2 C), temperature source Oral, resp. rate (!) 7, height 5\' 9"  (1.753 m), weight 114.3 kg, SpO2 (!) 63 %. Physical Exam physical examination is limited due to need for PPE/CAPR GENERAL: Obese gentleman, appears much older than stated age, exceedingly diaphoretic, increased work of breathing on high flow nasal cannula and 100% nonrebreather. HEAD: Normocephalic, atraumatic.  EYES: Pupils equal, round, reactive to light.  No scleral icterus.  MOUTH: Edentulous, oral mucosa moist. NECK: Supple. No thyromegaly. Trachea midline.  Thick neck.  No adenopathy. PULMONARY: Good air entry bilaterally.  Coarse breath sounds crackles. CARDIOVASCULAR: Monitor showed tachycardia with regular rhythm  ABDOMEN: Obese, there is a reducible umbilical hernia approximately 5 cm in diameter.  Abdominal wall is lax. MUSCULOSKELETAL: No joint deformity, no clubbing, no edema.  NEUROLOGIC: Somewhat lethargic but arousable.  States "I am tired" SKIN: Profusely diaphoretic, warm  PSYCH: Patient was lethargic.   Assessment/Plan:  Acute respiratory failure with hypoxia due to COVID-19 COVID-19 pneumonia Proceed with intubation the persistent hypoxia Continue COVID-19 protocol Patient is status post monoclonal antibody infusion High risk for cardiac arrest and death Further recommendations after patient stabilized Certainly warrants admission to ICU  Metabolic/lactic acidosis Troponin elevation Demand ischemia due to hypoxia  Discussed with Dr. , MD [facility] Jul 01, 2020, 1:33 PM   *This note was  dictated using voice recognition software/Dragon.  Despite best efforts to proofread, errors can occur which can change the meaning.  Any change was purely unintentional.

## 2020-06-20 NOTE — Consult Note (Signed)
CODE SEPSIS - PHARMACY COMMUNICATION  **Broad Spectrum Antibiotics should be administered within 1 hour of Sepsis diagnosis**  Time Code Sepsis Called/Page Received: 0945  Antibiotics Ordered: azithromycin 500 mg q24h and ceftriaxone 2 g IV q24h per EDP [place]  Time of 1st antibiotic administration: 0958  Additional action taken by pharmacy: N/A  If necessary, Name of Provider/Nurse Contacted: N/A  Reatha Armour, PharmD Pharmacy Resident  Jul 07, 2020 11:13 AM

## 2020-06-20 NOTE — Code Documentation (Signed)
Pulse check, no pulse, cpr continued

## 2020-06-20 DEATH — deceased

## 2020-07-29 IMAGING — CR DG LUMBAR SPINE 2-3V
3 series · 3 of 3 positions shown · non-contrast
Comparison: MRI 07/21/2017

CLINICAL DATA: Mid to low back pain radiating down both legs after
fall on Thanksgiving.

EXAM:
LUMBAR SPINE - 2-3 VIEW

[l-spine ap]
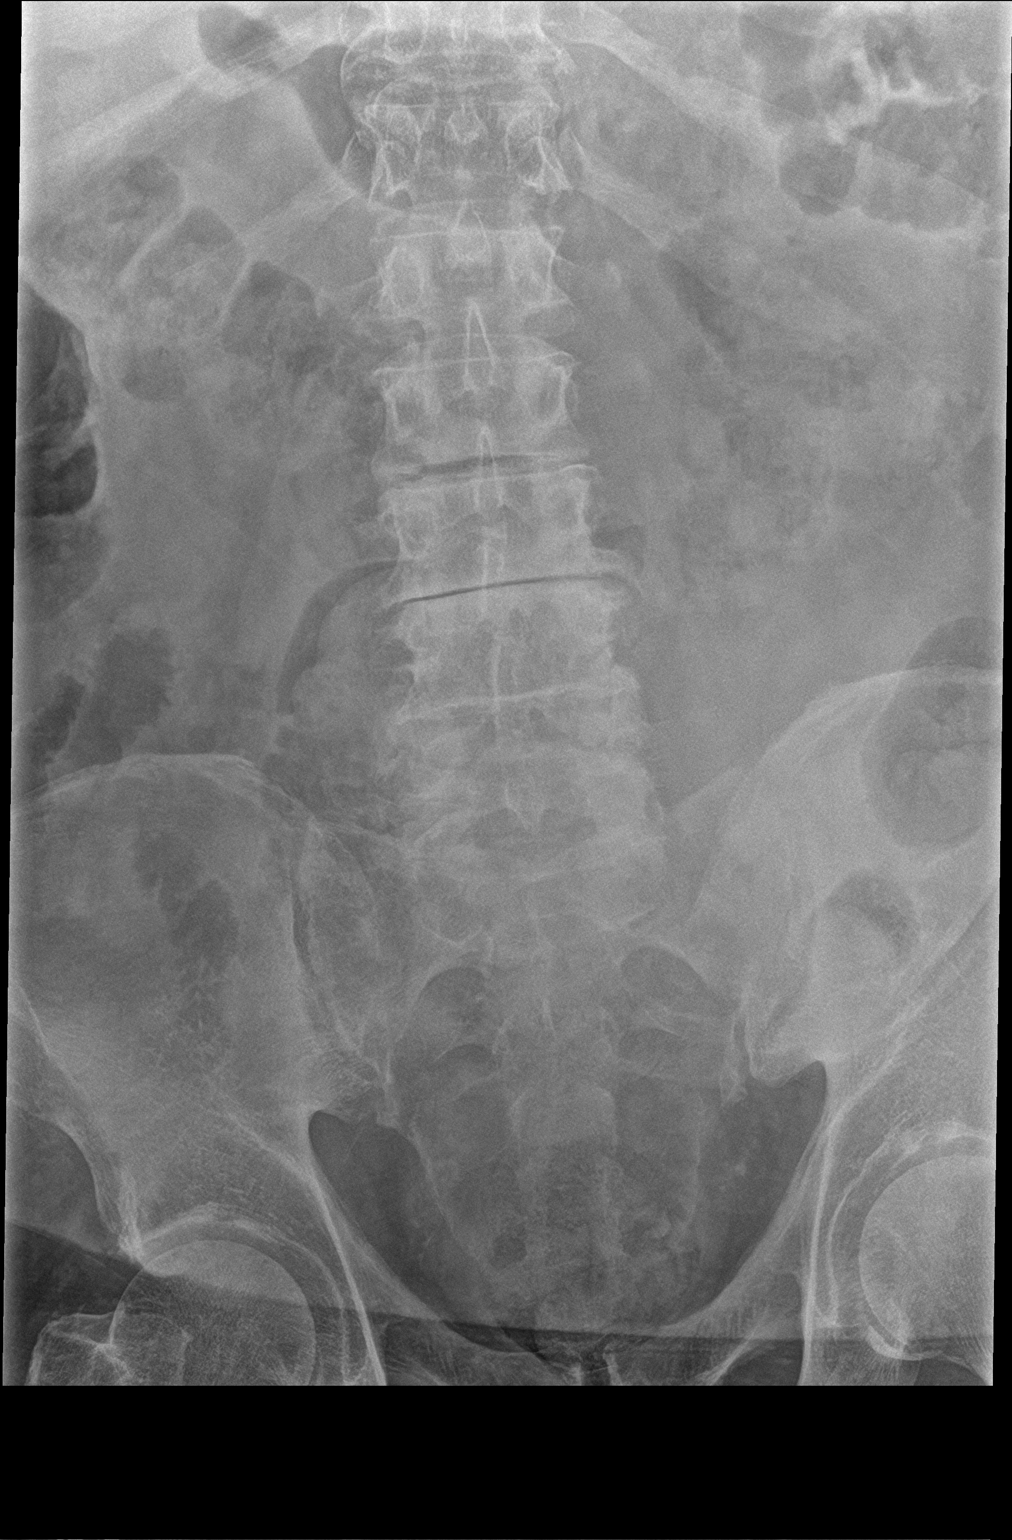

[l-spine lat]
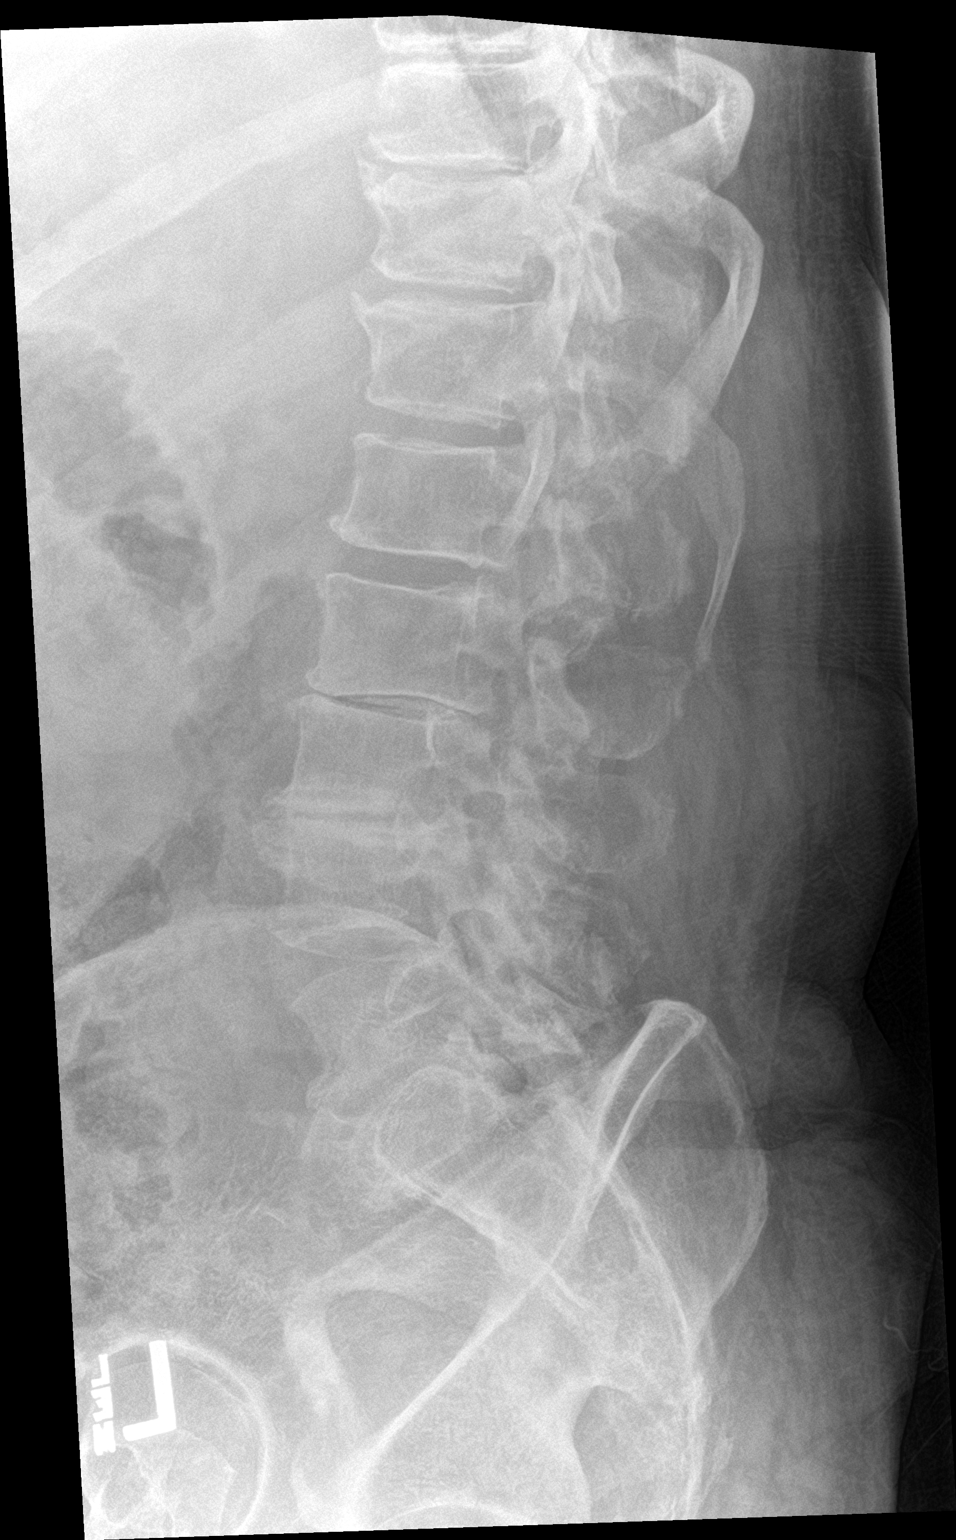

[l-spine spot]
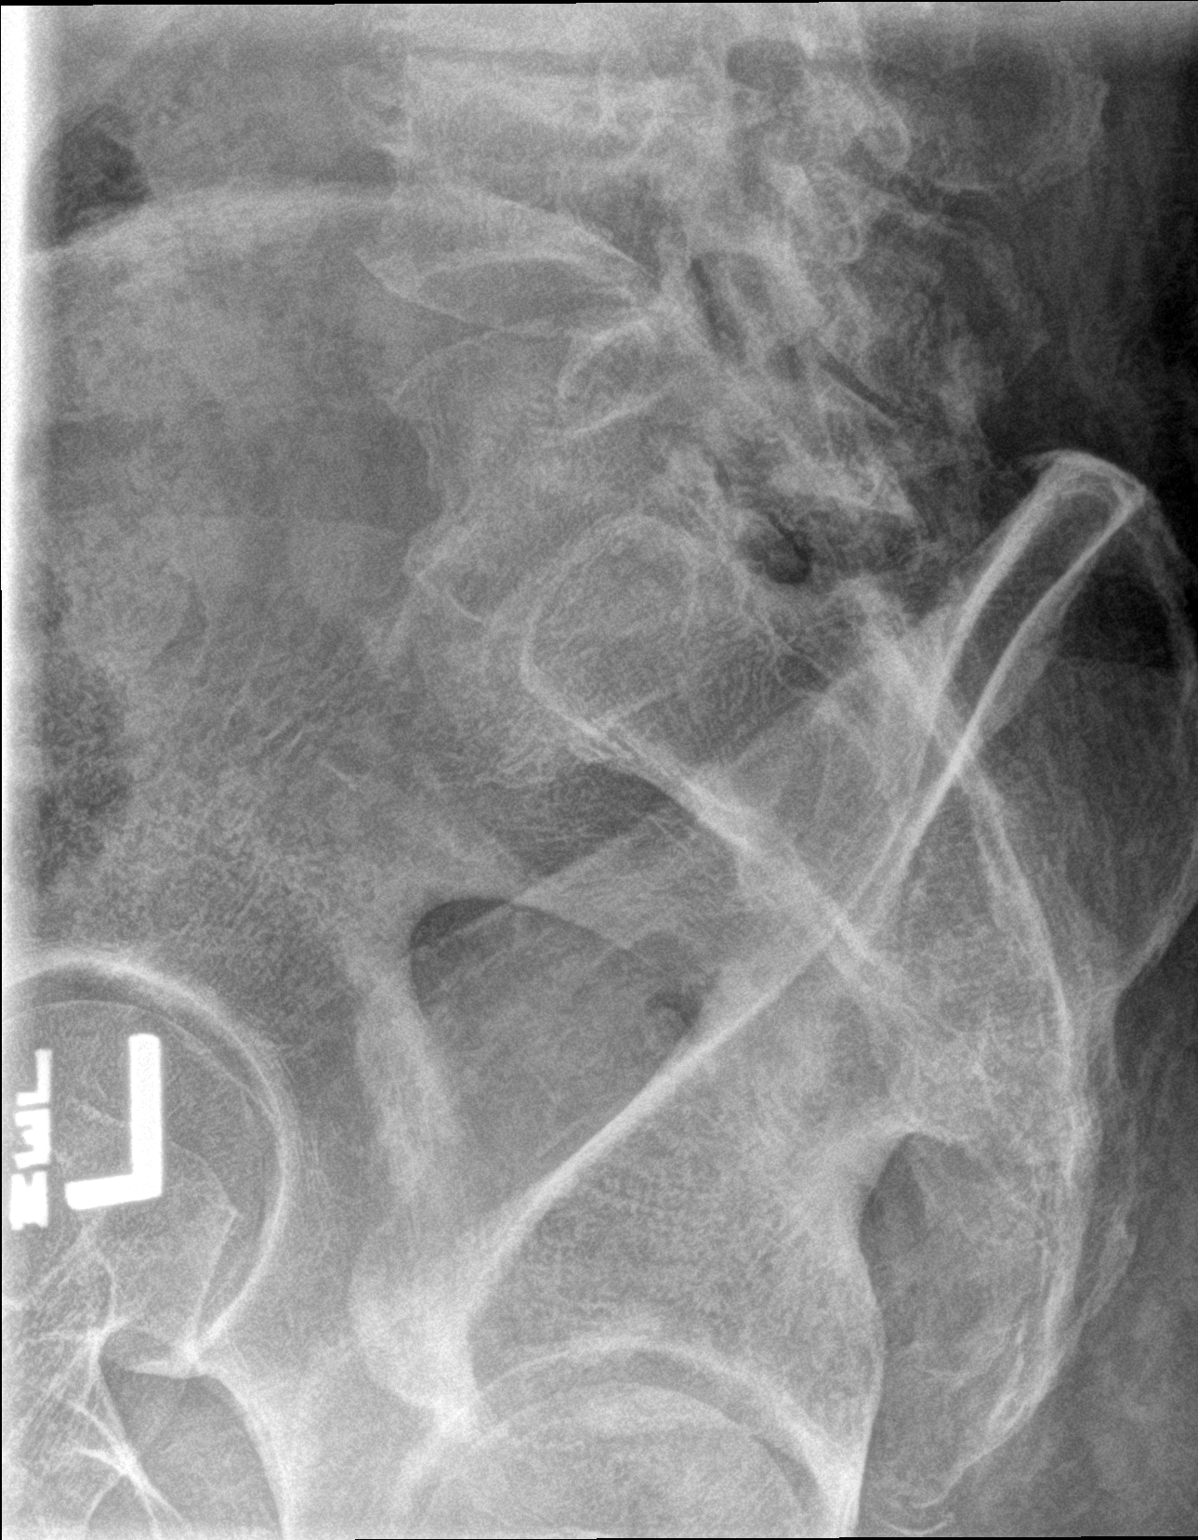

[3 of 3 positions shown; findings below may reference images not displayed]

FINDINGS: Vertebral body alignment and heights are normal. There is moderate
spondylosis of the lumbar spine. Severe disc space narrowing at the
L3-4 level with moderate disc space narrowing at the L2-3 level.
Mild disc space narrowing at the L4-5 and L5-S1 levels. Mild
symmetric degenerative change of the hips.
IMPRESSION: Moderate spondylosis of the lumbar spine with severe disc disease at
the L3-4 level with moderate disc disease at the L2-3 level. Mild
disc disease at the L4-5 and L5-S1 levels.

## 2022-04-05 IMAGING — DX DG CHEST 1V PORT
1 series · 1 of 1 positions shown · non-contrast
Comparison: 12/05/2014 chest radiograph

CLINICAL DATA: COVID.  Increasing shortness of breath.

EXAM:
PORTABLE CHEST 1 VIEW

[chest ap]
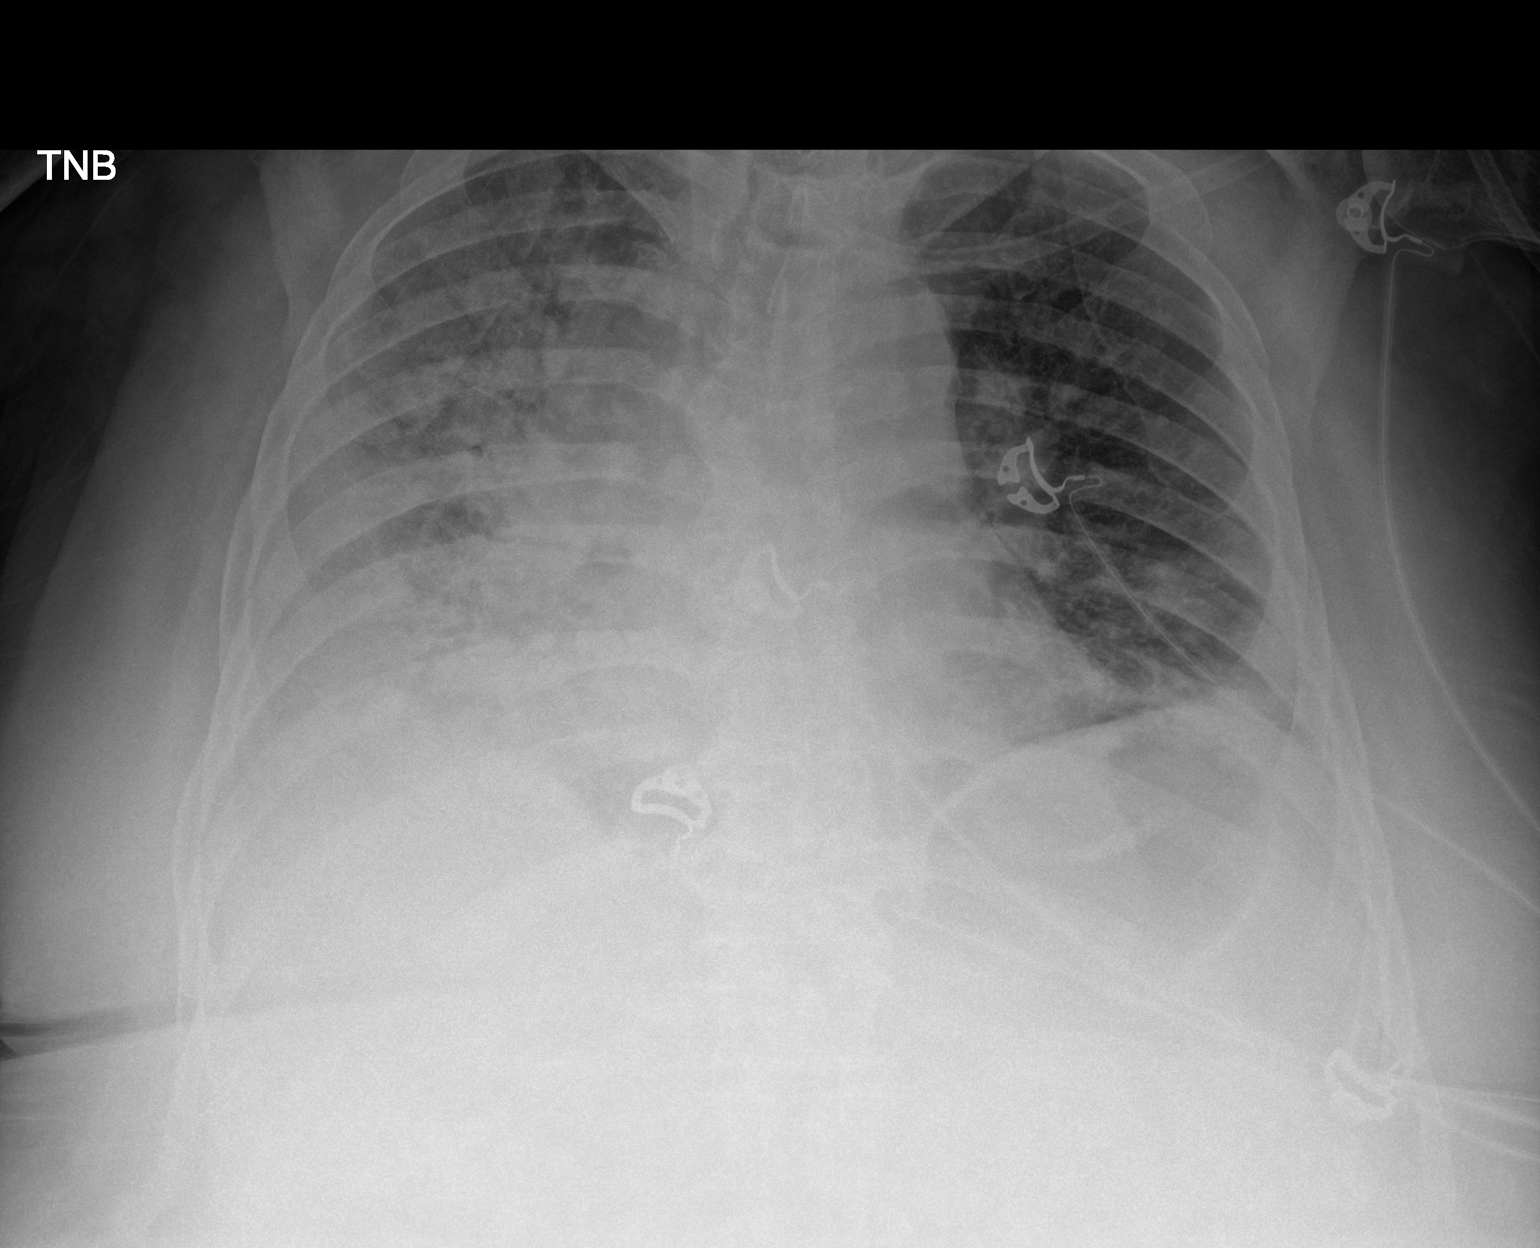

[1 of 1 positions shown; findings below may reference images not displayed]

FINDINGS: New diffuse airspace opacities throughout the RIGHT line and within
the LEFT LOWER lung noted, likely representing infection/pneumonia.
There may be a RIGHT pleural effusion present.

There is no evidence of pneumothorax.

No acute bony abnormalities are identified.

The cardiomediastinal silhouette is obscured.
IMPRESSION: New bilateral airspace opacities, RIGHT greater than LEFT, likely
representing infection/pneumonia. Possible RIGHT pleural effusion.
